# Patient Record
Sex: Female | Born: 1940
Health system: Southern US, Community
[De-identification: ages and names within clinical notes are randomized; demographics above are authoritative.]

## PROBLEM LIST (undated history)

## (undated) DIAGNOSIS — I2510 Atherosclerotic heart disease of native coronary artery without angina pectoris: Secondary | ICD-10-CM

## (undated) DIAGNOSIS — R600 Localized edema: Secondary | ICD-10-CM

## (undated) DIAGNOSIS — F32A Depression, unspecified: Secondary | ICD-10-CM

## (undated) DIAGNOSIS — K219 Gastro-esophageal reflux disease without esophagitis: Secondary | ICD-10-CM

## (undated) DIAGNOSIS — R609 Edema, unspecified: Secondary | ICD-10-CM

## (undated) DIAGNOSIS — F329 Major depressive disorder, single episode, unspecified: Secondary | ICD-10-CM

## (undated) DIAGNOSIS — F419 Anxiety disorder, unspecified: Secondary | ICD-10-CM

## (undated) DIAGNOSIS — K279 Peptic ulcer, site unspecified, unspecified as acute or chronic, without hemorrhage or perforation: Secondary | ICD-10-CM

## (undated) DIAGNOSIS — J449 Chronic obstructive pulmonary disease, unspecified: Secondary | ICD-10-CM

## (undated) DIAGNOSIS — K5520 Angiodysplasia of colon without hemorrhage: Secondary | ICD-10-CM

## (undated) DIAGNOSIS — K922 Gastrointestinal hemorrhage, unspecified: Secondary | ICD-10-CM

## (undated) DIAGNOSIS — I10 Essential (primary) hypertension: Secondary | ICD-10-CM

## (undated) DIAGNOSIS — K449 Diaphragmatic hernia without obstruction or gangrene: Secondary | ICD-10-CM

## (undated) DIAGNOSIS — K5790 Diverticulosis of intestine, part unspecified, without perforation or abscess without bleeding: Secondary | ICD-10-CM

## (undated) DIAGNOSIS — I890 Lymphedema, not elsewhere classified: Secondary | ICD-10-CM

## (undated) DIAGNOSIS — I4891 Unspecified atrial fibrillation: Secondary | ICD-10-CM

## (undated) DIAGNOSIS — I214 Non-ST elevation (NSTEMI) myocardial infarction: Secondary | ICD-10-CM

## (undated) DIAGNOSIS — M797 Fibromyalgia: Secondary | ICD-10-CM

## (undated) DIAGNOSIS — Z8701 Personal history of pneumonia (recurrent): Secondary | ICD-10-CM

## (undated) HISTORY — PX: CHOLECYSTECTOMY: SHX55

## (undated) HISTORY — DX: Chronic obstructive pulmonary disease, unspecified: J44.9

## (undated) HISTORY — DX: Major depressive disorder, single episode, unspecified: F32.9

## (undated) HISTORY — DX: Diverticulosis of intestine, part unspecified, without perforation or abscess without bleeding: K57.90

## (undated) HISTORY — DX: Gastrointestinal hemorrhage, unspecified: K92.2

## (undated) HISTORY — DX: Personal history of pneumonia (recurrent): Z87.01

## (undated) HISTORY — DX: Depression, unspecified: F32.A

## (undated) HISTORY — PX: ABDOMINAL EXPLORATION SURGERY: SHX538

## (undated) HISTORY — PX: ABDOMINAL HYSTERECTOMY: SHX81

## (undated) HISTORY — DX: Anxiety disorder, unspecified: F41.9

## (undated) HISTORY — PX: OTHER SURGICAL HISTORY: SHX169

## (undated) HISTORY — DX: Localized edema: R60.0

## (undated) HISTORY — PX: KNEE SURGERY: SHX244

## (undated) HISTORY — DX: Edema, unspecified: R60.9

---

## 2004-01-05 ENCOUNTER — Emergency Department (HOSPITAL_COMMUNITY): Admission: EM | Admit: 2004-01-05 | Discharge: 2004-01-05 | Payer: Self-pay | Admitting: Emergency Medicine

## 2004-03-31 ENCOUNTER — Ambulatory Visit (HOSPITAL_COMMUNITY): Admission: RE | Admit: 2004-03-31 | Discharge: 2004-03-31 | Payer: Self-pay | Admitting: *Deleted

## 2006-02-01 ENCOUNTER — Emergency Department (HOSPITAL_COMMUNITY): Admission: EM | Admit: 2006-02-01 | Discharge: 2006-02-01 | Payer: Self-pay | Admitting: Emergency Medicine

## 2008-03-16 ENCOUNTER — Emergency Department (HOSPITAL_COMMUNITY): Admission: EM | Admit: 2008-03-16 | Discharge: 2008-03-16 | Payer: Self-pay | Admitting: Emergency Medicine

## 2011-03-25 ENCOUNTER — Emergency Department (HOSPITAL_COMMUNITY)
Admission: EM | Admit: 2011-03-25 | Discharge: 2011-03-25 | Disposition: A | Discharge status: Home / Self Care | Payer: Medicare Other | Attending: Emergency Medicine | Admitting: Emergency Medicine

## 2011-03-25 ENCOUNTER — Encounter: Payer: Self-pay | Admitting: *Deleted

## 2011-03-25 ENCOUNTER — Emergency Department (HOSPITAL_COMMUNITY): Payer: Medicare Other

## 2011-03-25 DIAGNOSIS — F411 Generalized anxiety disorder: Secondary | ICD-10-CM | POA: Insufficient documentation

## 2011-03-25 DIAGNOSIS — Z79899 Other long term (current) drug therapy: Secondary | ICD-10-CM | POA: Insufficient documentation

## 2011-03-25 DIAGNOSIS — R3989 Other symptoms and signs involving the genitourinary system: Secondary | ICD-10-CM | POA: Insufficient documentation

## 2011-03-25 DIAGNOSIS — R609 Edema, unspecified: Secondary | ICD-10-CM | POA: Insufficient documentation

## 2011-03-25 DIAGNOSIS — I10 Essential (primary) hypertension: Secondary | ICD-10-CM | POA: Insufficient documentation

## 2011-03-25 DIAGNOSIS — R142 Eructation: Secondary | ICD-10-CM | POA: Insufficient documentation

## 2011-03-25 DIAGNOSIS — R109 Unspecified abdominal pain: Secondary | ICD-10-CM | POA: Insufficient documentation

## 2011-03-25 DIAGNOSIS — R197 Diarrhea, unspecified: Secondary | ICD-10-CM | POA: Insufficient documentation

## 2011-03-25 DIAGNOSIS — R141 Gas pain: Secondary | ICD-10-CM | POA: Insufficient documentation

## 2011-03-25 DIAGNOSIS — R5381 Other malaise: Secondary | ICD-10-CM | POA: Insufficient documentation

## 2011-03-25 DIAGNOSIS — K5289 Other specified noninfective gastroenteritis and colitis: Secondary | ICD-10-CM | POA: Insufficient documentation

## 2011-03-25 DIAGNOSIS — J45909 Unspecified asthma, uncomplicated: Secondary | ICD-10-CM | POA: Insufficient documentation

## 2011-03-25 DIAGNOSIS — K529 Noninfective gastroenteritis and colitis, unspecified: Secondary | ICD-10-CM

## 2011-03-25 HISTORY — DX: Fibromyalgia: M79.7

## 2011-03-25 HISTORY — DX: Essential (primary) hypertension: I10

## 2011-03-25 LAB — CBC
HCT: 40 % (ref 36.0–46.0)
Hemoglobin: 13 g/dL (ref 12.0–15.0)
MCH: 26.6 pg (ref 26.0–34.0)
MCHC: 32.5 g/dL (ref 30.0–36.0)
MCV: 82 fL (ref 78.0–100.0)
Platelets: 192 10*3/uL (ref 150–400)
RBC: 4.88 MIL/uL (ref 3.87–5.11)
RDW: 15.9 % — ABNORMAL HIGH (ref 11.5–15.5)
WBC: 5.7 10*3/uL (ref 4.0–10.5)

## 2011-03-25 LAB — COMPREHENSIVE METABOLIC PANEL
ALT: 26 U/L (ref 0–35)
AST: 22 U/L (ref 0–37)
Albumin: 3.6 g/dL (ref 3.5–5.2)
Alkaline Phosphatase: 100 U/L (ref 39–117)
BUN: 13 mg/dL (ref 6–23)
CO2: 32 mEq/L (ref 19–32)
Calcium: 9.4 mg/dL (ref 8.4–10.5)
Chloride: 99 mEq/L (ref 96–112)
Creatinine, Ser: 0.84 mg/dL (ref 0.50–1.10)
GFR calc Af Amer: >60 mL/min (ref >60)
GFR calc non Af Amer: >60 mL/min (ref >60)
Glucose, Bld: 86 mg/dL (ref 70–99)
Potassium: 3.7 mEq/L (ref 3.5–5.1)
Sodium: 137 mEq/L (ref 135–145)
Total Bilirubin: 0.2 mg/dL — ABNORMAL LOW (ref 0.3–1.2)
Total Protein: 7 g/dL (ref 6.0–8.3)

## 2011-03-25 LAB — DIFFERENTIAL
Basophils Absolute: 0 10*3/uL (ref 0.0–0.1)
Basophils Relative: 1 % (ref 0–1)
Eosinophils Absolute: 0.1 10*3/uL (ref 0.0–0.7)
Eosinophils Relative: 2 % (ref 0–5)
Lymphocytes Relative: 47 % — ABNORMAL HIGH (ref 12–46)
Lymphs Abs: 2.7 10*3/uL (ref 0.7–4.0)
Monocytes Absolute: 0.4 10*3/uL (ref 0.1–1.0)
Monocytes Relative: 7 % (ref 3–12)
Neutro Abs: 2.5 10*3/uL (ref 1.7–7.7)
Neutrophils Relative %: 45 % (ref 43–77)

## 2011-03-25 LAB — URINALYSIS, ROUTINE W REFLEX MICROSCOPIC
Bilirubin Urine: NEGATIVE
Glucose, UA: NEGATIVE mg/dL
Hgb urine dipstick: NEGATIVE
Ketones, ur: NEGATIVE mg/dL
Leukocytes, UA: NEGATIVE
Nitrite: NEGATIVE
Protein, ur: NEGATIVE mg/dL
Specific Gravity, Urine: 1.015 (ref 1.005–1.030)
Urobilinogen, UA: 0.2 mg/dL (ref 0.0–1.0)
pH: 6 (ref 5.0–8.0)

## 2011-03-25 MED ORDER — SODIUM CHLORIDE 0.9 % IV SOLN: Freq: Once | INTRAVENOUS | Status: DC

## 2011-03-25 NOTE — ED Notes (Signed)
Signature pad not working. Pt agreed to D/C and verbalized understanding. Pt stable and husband with pt for transport home.

## 2011-03-25 NOTE — ED Provider Notes (Signed)
Scribed for Sharon Lennert, MD, the patient was seen in room APA19/APA19 . This chart was scribed by Ellie Lunch. This patient's care was started at 7:56 PM.   CSN: [id number] Arrival date & time: 03/25/2011  7:11 PM  Chief Complaint  Patient presents with  . Flank Pain    (Consider location/radiation/quality/duration/timing/severity/associated sxs/prior treatment) HPI PT seen at 57: Sharon Compton is a 70 y.o. female who presents to the Emergency Department complaining of diarrhea and general abdominal pain for the past 4 days. Pain has been constant and is described as severe. Pt c/o associated weakness, hot flashes, clamminess, abdominal and pedal swelling and problems voiding. Pt reports she has not been able to urinate since onset. Pt reports she feels somewhat improved today. She denies bloody stool.   Past Medical History  Diagnosis Date  . Hypertension   . Anxiety   . Asthma   . Bronchitis   . Fibromyalgia   . Pneumonia     Past Surgical History  Procedure Date  . Abdominal hysterectomy   . Cholecystectomy     No family history on file.  History  Substance Use Topics  . Smoking status: Not on file  . Smokeless tobacco: Not on file  . Alcohol Use: No    Review of Systems  Constitutional: Negative for fatigue.  HENT: Negative for congestion, sinus pressure and ear discharge.   Eyes: Negative for discharge.  Respiratory: Negative for cough.   Cardiovascular: Positive for leg swelling. Negative for chest pain.  Gastrointestinal: Positive for abdominal pain, diarrhea and abdominal distention.  Genitourinary: Positive for difficulty urinating. Negative for frequency and hematuria.  Musculoskeletal: Negative for back pain.  Skin: Negative for rash.  Neurological: Negative for seizures and headaches.  Hematological: Negative.   Psychiatric/Behavioral: Negative for hallucinations.    Allergies  Codeine  Home Medications   Current Outpatient Rx  Name Route  Sig Dispense Refill  . ALBUTEROL SULFATE HFA 108 (90 BASE) MCG/ACT IN AERS Inhalation Inhale 2 puffs into the lungs 4 (four) times daily as needed. For shortness of breath     . ALPRAZOLAM 1 MG PO TABS Oral Take 1 mg by mouth every 6 (six) hours as needed. anxiety     . GABAPENTIN 300 MG PO CAPS Oral Take 300 mg by mouth 3 (three) times daily.      [name] LOSARTAN POTASSIUM 100 MG PO TABS Oral Take 100 mg by mouth daily.      [name] METOPROLOL SUCCINATE 100 MG PO TB24 Oral Take 100 mg by mouth daily.      [name] OMEPRAZOLE 40 MG PO CPDR Oral Take 40 mg by mouth daily.        BP 196/93  Pulse 102  Temp(Src) 98.7 F (37.1 C) (Oral)  Resp 22  Ht 5' (1.524 m)  Wt 181 lb (82.101 kg)  BMI 35.35 kg/m2  SpO2 99%  Physical Exam  Nursing note and vitals reviewed. Constitutional: She appears well-developed and well-nourished. No distress.  HENT:  Head: Normocephalic and atraumatic.  Right Ear: External ear normal.  Left Ear: External ear normal.  Eyes: Conjunctivae are normal. Right eye exhibits no discharge. Left eye exhibits no discharge. No scleral icterus.  Neck: Neck supple. No tracheal deviation present.       Bilateral anterior neck tenderness  Cardiovascular: Regular rhythm and intact distal pulses.   Pulmonary/Chest: Effort normal and breath sounds normal. No stridor. No respiratory distress. She has no wheezes. She has no rales.  Abdominal: Soft. Bowel sounds are normal. She exhibits distension. There is tenderness (general abdominal tenderness). There is no rebound and no guarding.  Musculoskeletal: She exhibits edema (2+ pitting edema at ankles). She exhibits no tenderness.  Neurological: She is alert. She has normal strength. No sensory deficit. Cranial nerve deficit:  no gross defecits noted. She exhibits normal muscle tone. She displays no seizure activity. Coordination normal.  Skin: Skin is warm and dry. No rash noted.  Psychiatric: She has a normal mood and affect.   Tenderness anterior  neck. Distended abdomen general tenderness. 2+ edema ankles.  Procedures  OTHER DATA REVIEWED: Nursing notes, vital signs, and past medical records reviewed.  DIAGNOSTIC STUDIES: Oxygen Saturation is 99% on room air, normal by my interpretation.    LABS / RADIOLOGY:  Labs Reviewed  CBC - Abnormal; Notable for the following:    RDW 15.9 (*)    All other components within normal limits  DIFFERENTIAL - Abnormal; Notable for the following:    Lymphocytes Relative 47 (*)    All other components within normal limits  COMPREHENSIVE METABOLIC PANEL - Abnormal; Notable for the following:    Total Bilirubin 0.2 (*)    All other components within normal limits  URINALYSIS, ROUTINE W REFLEX MICROSCOPIC  Dg Abd Acute W/chest  03/25/2011  *RADIOLOGY REPORT*  Clinical Data: Abdominal pain.  Anuria for 1 day.  ACUTE ABDOMEN SERIES (ABDOMEN 2 VIEW & CHEST 1 VIEW)  Comparison: None.  Findings: Shallow inspiration. Normal heart size and pulmonary vascularity.  No focal consolidation in the lungs.  No blunting of costophrenic angles.  Scattered gas and stool in the colon.  No small or large bowel dilatation.  No free intra-abdominal air.  No abnormal air fluid levels.  No radiopaque stones. Surgical clips in the right upper quadrant.  IMPRESSION: No evidence of active pulmonary disease.  Nonobstructive bowel gas pattern.  Original Report Authenticated By: Marlon Pel, M.D.     ED COURSE / COORDINATION OF CARE: 20:50 Pt received foley cath. 300 ml clear yellow urine drained. 21:42 Pt recheck. Discussed lab and imaging results. Discussed plan to discharge.  abd pain from gastroenteritis.   Decrease urine output from dehydration The chart was scribed for me under my direct supervision.  I personally performed the history, physical, and medical decision making and all procedures in the evaluation of this patient.Sharon Lennert, MD 03/25/11 [phone]

## 2011-03-25 NOTE — ED Notes (Signed)
Clear yellow urine draining to foley cath. Urine sample sent. NAD at this time

## 2011-03-25 NOTE — ED Notes (Signed)
Pt reports bilateral flank pain starting 4 days ago

## 2011-03-26 LAB — CBC
HCT: 37
Hemoglobin: 12.2
MCHC: 32.9
MCV: 77.8 — ABNORMAL LOW
Platelets: 242
RBC: 4.76
RDW: 17.9 — ABNORMAL HIGH
WBC: 7.6

## 2011-03-26 LAB — DIFFERENTIAL
Basophils Absolute: 0
Basophils Relative: 1
Eosinophils Absolute: 0.1
Eosinophils Relative: 1
Lymphocytes Relative: 48 — ABNORMAL HIGH
Lymphs Abs: 3.6
Monocytes Absolute: 0.5
Monocytes Relative: 7
Neutro Abs: 3.3
Neutrophils Relative %: 44

## 2011-03-26 LAB — BASIC METABOLIC PANEL
BUN: 12
CO2: 32
Calcium: 8.9
Chloride: 93 — ABNORMAL LOW
Creatinine, Ser: 0.87
GFR calc Af Amer: >60
GFR calc non Af Amer: >60
Glucose, Bld: 97
Potassium: 3 — ABNORMAL LOW
Sodium: 133 — ABNORMAL LOW

## 2011-06-24 ENCOUNTER — Encounter (HOSPITAL_COMMUNITY): Payer: Self-pay | Admitting: Emergency Medicine

## 2011-06-24 ENCOUNTER — Emergency Department (HOSPITAL_COMMUNITY): Payer: Medicare Other

## 2011-06-24 ENCOUNTER — Emergency Department (HOSPITAL_COMMUNITY)
Admission: EM | Admit: 2011-06-24 | Discharge: 2011-06-25 | Disposition: A | Discharge status: Home / Self Care | Payer: Medicare Other | Attending: Emergency Medicine | Admitting: Emergency Medicine

## 2011-06-24 DIAGNOSIS — R609 Edema, unspecified: Secondary | ICD-10-CM | POA: Insufficient documentation

## 2011-06-24 DIAGNOSIS — R0602 Shortness of breath: Secondary | ICD-10-CM | POA: Insufficient documentation

## 2011-06-24 DIAGNOSIS — Z8701 Personal history of pneumonia (recurrent): Secondary | ICD-10-CM | POA: Insufficient documentation

## 2011-06-24 DIAGNOSIS — J3489 Other specified disorders of nose and nasal sinuses: Secondary | ICD-10-CM | POA: Insufficient documentation

## 2011-06-24 DIAGNOSIS — R509 Fever, unspecified: Secondary | ICD-10-CM | POA: Insufficient documentation

## 2011-06-24 DIAGNOSIS — Z9079 Acquired absence of other genital organ(s): Secondary | ICD-10-CM | POA: Insufficient documentation

## 2011-06-24 DIAGNOSIS — J45909 Unspecified asthma, uncomplicated: Secondary | ICD-10-CM | POA: Insufficient documentation

## 2011-06-24 DIAGNOSIS — K449 Diaphragmatic hernia without obstruction or gangrene: Secondary | ICD-10-CM | POA: Insufficient documentation

## 2011-06-24 DIAGNOSIS — F411 Generalized anxiety disorder: Secondary | ICD-10-CM | POA: Insufficient documentation

## 2011-06-24 DIAGNOSIS — J984 Other disorders of lung: Secondary | ICD-10-CM | POA: Insufficient documentation

## 2011-06-24 DIAGNOSIS — IMO0001 Reserved for inherently not codable concepts without codable children: Secondary | ICD-10-CM | POA: Insufficient documentation

## 2011-06-24 DIAGNOSIS — R197 Diarrhea, unspecified: Secondary | ICD-10-CM | POA: Insufficient documentation

## 2011-06-24 DIAGNOSIS — R110 Nausea: Secondary | ICD-10-CM | POA: Insufficient documentation

## 2011-06-24 DIAGNOSIS — I10 Essential (primary) hypertension: Secondary | ICD-10-CM | POA: Insufficient documentation

## 2011-06-24 DIAGNOSIS — Z87891 Personal history of nicotine dependence: Secondary | ICD-10-CM | POA: Insufficient documentation

## 2011-06-24 DIAGNOSIS — Z9889 Other specified postprocedural states: Secondary | ICD-10-CM | POA: Insufficient documentation

## 2011-06-24 DIAGNOSIS — R070 Pain in throat: Secondary | ICD-10-CM | POA: Insufficient documentation

## 2011-06-24 HISTORY — DX: Diaphragmatic hernia without obstruction or gangrene: K44.9

## 2011-06-24 HISTORY — DX: Peptic ulcer, site unspecified, unspecified as acute or chronic, without hemorrhage or perforation: K27.9

## 2011-06-24 LAB — URINALYSIS, ROUTINE W REFLEX MICROSCOPIC
Bilirubin Urine: NEGATIVE
Glucose, UA: NEGATIVE mg/dL
Ketones, ur: NEGATIVE mg/dL
Leukocytes, UA: NEGATIVE
Nitrite: NEGATIVE
Protein, ur: NEGATIVE mg/dL
Specific Gravity, Urine: 1.02 (ref 1.005–1.030)
Urobilinogen, UA: 0.2 mg/dL (ref 0.0–1.0)
pH: 6 (ref 5.0–8.0)

## 2011-06-24 LAB — URINE MICROSCOPIC-ADD ON

## 2011-06-24 MED ORDER — ALBUTEROL SULFATE (5 MG/ML) 0.5% IN NEBU
5.0000 mg | INHALATION_SOLUTION | Freq: Once | RESPIRATORY_TRACT | Status: AC
  Administered 2011-06-24: 5 mg via RESPIRATORY_TRACT
  Filled 2011-06-24: qty 1

## 2011-06-24 MED ORDER — ALBUTEROL SULFATE (5 MG/ML) 0.5% IN NEBU: 2.5000 mg | INHALATION_SOLUTION | Freq: Once | RESPIRATORY_TRACT | Status: DC

## 2011-06-24 MED ORDER — IPRATROPIUM BROMIDE 0.02 % IN SOLN
0.5000 mg | Freq: Once | RESPIRATORY_TRACT | Status: AC
  Administered 2011-06-24: 0.5 mg via RESPIRATORY_TRACT
  Filled 2011-06-24: qty 2.5

## 2011-06-24 NOTE — ED Notes (Signed)
Patient c/o generalized aching, cough, and fevers x1 week. Per patient was at PCP to have blood work done last week and multiple people there had the flu.

## 2011-06-24 NOTE — ED Notes (Signed)
Pt states was exposed to the flu approx a week ago, has had chills,fever, sore throat, headache, productive cough (green sputum per pt) and body aches.  Pt reports bilateral  flank pain  That started 2 days ago.  Urine specimen obtained and sent to lab.

## 2011-06-24 NOTE — ED Provider Notes (Signed)
History     CSN: [id number]  Arrival date & time 06/24/11  1478   First MD Initiated Contact with Patient 06/24/11 2230      Chief Complaint  Patient presents with  . Generalized Body Aches  . Cough  . Fever    (Consider location/radiation/quality/duration/timing/severity/associated sxs/prior treatment) HPI This is a 70 year old white female with a 5 day history of body aches, subjective fever and shortness of breath. The shortness of breath is worsened and is her principal reason for being here tonight. It is moderate to severe. It is exacerbated by exertion and not relieved by her albuterol inhaler. She states she believes she was exposed to flu about a week ago. She has had nausea and some diarrhea but no vomiting. She has sore throat and nasal congestion. She states she is frequently thirsty but does not urinate very much. This is been a chronic issue for her, as has lower extremity edema.  Past Medical History  Diagnosis Date  . Hypertension   . Anxiety   . Asthma   . Bronchitis   . Fibromyalgia   . Pneumonia   . Edema   . Peptic ulcer disease   . Hiatal hernia     Past Surgical History  Procedure Date  . Abdominal hysterectomy   . Cholecystectomy   . Abdominal exploration surgery   . Abd tumor removed     Family History  Problem Relation Age of Onset  . Diabetes Mother   . Hypertension Mother   . Cancer Father     History  Substance Use Topics  . Smoking status: Former Smoker -- 1.0 packs/day for 15 years    Types: Cigarettes    Quit date: 06/24/1999  . Smokeless tobacco: Never Used  . Alcohol Use: No    OB History    Grav Para Term Preterm Abortions TAB SAB Ect Mult Living   3 3 3       3       Review of Systems  All other systems reviewed and are negative.    Allergies  Sulfa antibiotics and Codeine  Home Medications   Current Outpatient Rx  Name Route Sig Dispense Refill  . ALBUTEROL SULFATE HFA 108 (90 BASE) MCG/ACT IN AERS  Inhalation Inhale 2 puffs into the lungs 4 (four) times daily as needed. For shortness of breath     . ALPRAZOLAM 1 MG PO TABS Oral Take 2 mg by mouth every morning. anxiety    . GABAPENTIN 300 MG PO CAPS Oral Take 300 mg by mouth 3 (three) times daily.      [name] POLYSACCHARIDE IRON COMPLEX 150 MG PO CAPS Oral Take 150 mg by mouth daily.      [name] LEVOTHYROXINE SODIUM 75 MCG PO TABS Oral Take 75 mcg by mouth daily.      [name] LOSARTAN POTASSIUM 100 MG PO TABS Oral Take 100 mg by mouth daily.      [name] METOPROLOL SUCCINATE ER 100 MG PO TB24 Oral Take 100 mg by mouth daily.        BP 200/96  Pulse 75  Temp(Src) 98.3 F (36.8 C) (Oral)  Resp 22  Ht 5' (1.524 m)  Wt 180 lb (81.647 kg)  BMI 35.15 kg/m2  SpO2 97%  Physical Exam General: Well-developed, well-nourished female in no acute distress; appearance consistent with age of record HENT: normocephalic, atraumatic Eyes: pupils equal round and reactive to light; extraocular muscles intact Neck: supple Heart: regular rate and rhythm Lungs: decreased  air movement bilaterally; shallow breaths; expiratory wheezes Abdomen: soft; nontender; nondistended; bowel sounds present Extremities: No deformity; full range of motion; 3+ pitting edema of lower extremities Neurologic: Awake, alert and oriented; motor function intact in all extremities and symmetric; no facial droop Skin: Warm and dry Psychiatric: Normal mood and affect    ED Course  Procedures (including critical care time)    MDM   Nursing notes and vitals signs, including pulse oximetry, reviewed.  Summary of this visit's results, reviewed by myself:  Labs:  Results for orders placed during the hospital encounter of 06/24/11  URINALYSIS, ROUTINE W REFLEX MICROSCOPIC      Component Value Range   Color, Urine YELLOW  YELLOW    APPearance CLEAR  CLEAR    Specific Gravity, Urine 1.020  1.005 - 1.030    pH 6.0  5.0 - 8.0    Glucose, UA NEGATIVE  NEGATIVE (mg/dL)   Hgb urine dipstick  TRACE (*) NEGATIVE    Bilirubin Urine NEGATIVE  NEGATIVE    Ketones, ur NEGATIVE  NEGATIVE (mg/dL)   Protein, ur NEGATIVE  NEGATIVE (mg/dL)   Urobilinogen, UA 0.2  0.0 - 1.0 (mg/dL)   Nitrite NEGATIVE  NEGATIVE    Leukocytes, UA NEGATIVE  NEGATIVE   URINE MICROSCOPIC-ADD ON      Component Value Range   Squamous Epithelial / LPF FEW (*) RARE    WBC, UA 3-6  <3 (WBC/hpf)   RBC / HPF 7-10  <3 (RBC/hpf)   Bacteria, UA RARE  RARE    Dg Chest 2 View  06/25/2011  *RADIOLOGY REPORT*  Clinical Data: Cough and shortness of breath.  Weakness.  Flu symptoms.  Fever, sweats, chills.  CHEST - 2 VIEW  Comparison: 01/05/2004  Findings: The cardiomediastinal silhouette is within normal limits. The lungs are free of focal consolidations and pleural effusions. Surgical clips are present in the right upper quadrant of the abdomen. Visualized osseous structures have a normal appearance.  IMPRESSION: Negative exam.  Original Report Authenticated By: Patterson Hammersmith, M.D.   1:38 AM Air movement improved after 2 neb treatments. Patient states she is ready to go home. Suspect influenza with exacerbation of her chronic lung disease.         Hanley Seamen, MD 06/25/11 [phone]

## 2011-06-25 MED ORDER — ALBUTEROL SULFATE (5 MG/ML) 0.5% IN NEBU
5.0000 mg | INHALATION_SOLUTION | Freq: Once | RESPIRATORY_TRACT | Status: AC
  Administered 2011-06-25: 5 mg via RESPIRATORY_TRACT
  Filled 2011-06-25: qty 1

## 2011-06-25 MED ORDER — IPRATROPIUM BROMIDE 0.02 % IN SOLN
0.5000 mg | Freq: Once | RESPIRATORY_TRACT | Status: AC
  Administered 2011-06-25: 0.5 mg via RESPIRATORY_TRACT
  Filled 2011-06-25: qty 2.5

## 2011-07-31 DIAGNOSIS — R404 Transient alteration of awareness: Secondary | ICD-10-CM | POA: Diagnosis not present

## 2011-07-31 DIAGNOSIS — R0602 Shortness of breath: Secondary | ICD-10-CM | POA: Diagnosis not present

## 2011-07-31 DIAGNOSIS — I4892 Unspecified atrial flutter: Secondary | ICD-10-CM | POA: Diagnosis present

## 2011-07-31 DIAGNOSIS — I6529 Occlusion and stenosis of unspecified carotid artery: Secondary | ICD-10-CM | POA: Diagnosis not present

## 2011-07-31 DIAGNOSIS — R609 Edema, unspecified: Secondary | ICD-10-CM | POA: Diagnosis present

## 2011-07-31 DIAGNOSIS — J45909 Unspecified asthma, uncomplicated: Secondary | ICD-10-CM | POA: Diagnosis present

## 2011-07-31 DIAGNOSIS — I959 Hypotension, unspecified: Secondary | ICD-10-CM | POA: Diagnosis present

## 2011-07-31 DIAGNOSIS — R0989 Other specified symptoms and signs involving the circulatory and respiratory systems: Secondary | ICD-10-CM | POA: Diagnosis not present

## 2011-07-31 DIAGNOSIS — Z8249 Family history of ischemic heart disease and other diseases of the circulatory system: Secondary | ICD-10-CM | POA: Diagnosis not present

## 2011-07-31 DIAGNOSIS — Z79899 Other long term (current) drug therapy: Secondary | ICD-10-CM | POA: Diagnosis not present

## 2011-07-31 DIAGNOSIS — F4310 Post-traumatic stress disorder, unspecified: Secondary | ICD-10-CM | POA: Diagnosis present

## 2011-07-31 DIAGNOSIS — Z8679 Personal history of other diseases of the circulatory system: Secondary | ICD-10-CM | POA: Diagnosis not present

## 2011-07-31 DIAGNOSIS — I4891 Unspecified atrial fibrillation: Secondary | ICD-10-CM | POA: Diagnosis not present

## 2011-07-31 DIAGNOSIS — F411 Generalized anxiety disorder: Secondary | ICD-10-CM | POA: Diagnosis present

## 2011-07-31 DIAGNOSIS — E669 Obesity, unspecified: Secondary | ICD-10-CM | POA: Diagnosis present

## 2011-07-31 DIAGNOSIS — Z882 Allergy status to sulfonamides status: Secondary | ICD-10-CM | POA: Diagnosis not present

## 2011-07-31 DIAGNOSIS — T7491XA Unspecified adult maltreatment, confirmed, initial encounter: Secondary | ICD-10-CM | POA: Diagnosis not present

## 2011-07-31 DIAGNOSIS — I472 Ventricular tachycardia: Secondary | ICD-10-CM | POA: Diagnosis not present

## 2011-07-31 DIAGNOSIS — I658 Occlusion and stenosis of other precerebral arteries: Secondary | ICD-10-CM | POA: Diagnosis present

## 2011-07-31 DIAGNOSIS — R0789 Other chest pain: Secondary | ICD-10-CM | POA: Diagnosis not present

## 2011-07-31 DIAGNOSIS — R5381 Other malaise: Secondary | ICD-10-CM | POA: Diagnosis not present

## 2011-07-31 DIAGNOSIS — I10 Essential (primary) hypertension: Secondary | ICD-10-CM | POA: Diagnosis not present

## 2011-07-31 DIAGNOSIS — R079 Chest pain, unspecified: Secondary | ICD-10-CM | POA: Diagnosis not present

## 2011-07-31 DIAGNOSIS — G8929 Other chronic pain: Secondary | ICD-10-CM | POA: Diagnosis present

## 2011-08-09 DIAGNOSIS — R609 Edema, unspecified: Secondary | ICD-10-CM | POA: Diagnosis not present

## 2011-08-09 DIAGNOSIS — I4891 Unspecified atrial fibrillation: Secondary | ICD-10-CM | POA: Diagnosis not present

## 2011-08-16 DIAGNOSIS — I872 Venous insufficiency (chronic) (peripheral): Secondary | ICD-10-CM | POA: Diagnosis not present

## 2011-08-20 ENCOUNTER — Encounter (HOSPITAL_COMMUNITY): Payer: Self-pay

## 2011-08-20 ENCOUNTER — Inpatient Hospital Stay (HOSPITAL_COMMUNITY)
Admission: RE | Admit: 2011-08-20 | Discharge: 2011-08-26 | DRG: 378 | Disposition: A | Discharge status: Home / Self Care | Payer: Medicare Other | Attending: Internal Medicine | Admitting: Internal Medicine

## 2011-08-20 ENCOUNTER — Other Ambulatory Visit: Payer: Self-pay

## 2011-08-20 DIAGNOSIS — R7989 Other specified abnormal findings of blood chemistry: Secondary | ICD-10-CM | POA: Diagnosis present

## 2011-08-20 DIAGNOSIS — K2960 Other gastritis without bleeding: Secondary | ICD-10-CM | POA: Diagnosis present

## 2011-08-20 DIAGNOSIS — R600 Localized edema: Secondary | ICD-10-CM | POA: Diagnosis present

## 2011-08-20 DIAGNOSIS — R7402 Elevation of levels of lactic acid dehydrogenase (LDH): Secondary | ICD-10-CM | POA: Diagnosis not present

## 2011-08-20 DIAGNOSIS — I872 Venous insufficiency (chronic) (peripheral): Secondary | ICD-10-CM | POA: Diagnosis present

## 2011-08-20 DIAGNOSIS — K449 Diaphragmatic hernia without obstruction or gangrene: Secondary | ICD-10-CM | POA: Diagnosis not present

## 2011-08-20 DIAGNOSIS — Z23 Encounter for immunization: Secondary | ICD-10-CM

## 2011-08-20 DIAGNOSIS — D62 Acute posthemorrhagic anemia: Secondary | ICD-10-CM | POA: Diagnosis present

## 2011-08-20 DIAGNOSIS — R609 Edema, unspecified: Secondary | ICD-10-CM | POA: Diagnosis present

## 2011-08-20 DIAGNOSIS — I4891 Unspecified atrial fibrillation: Secondary | ICD-10-CM | POA: Diagnosis present

## 2011-08-20 DIAGNOSIS — Z6837 Body mass index (BMI) 37.0-37.9, adult: Secondary | ICD-10-CM

## 2011-08-20 DIAGNOSIS — Z8711 Personal history of peptic ulcer disease: Secondary | ICD-10-CM | POA: Diagnosis not present

## 2011-08-20 DIAGNOSIS — K921 Melena: Secondary | ICD-10-CM | POA: Diagnosis not present

## 2011-08-20 DIAGNOSIS — K922 Gastrointestinal hemorrhage, unspecified: Secondary | ICD-10-CM | POA: Diagnosis present

## 2011-08-20 DIAGNOSIS — I10 Essential (primary) hypertension: Secondary | ICD-10-CM | POA: Diagnosis not present

## 2011-08-20 DIAGNOSIS — Z860100 Personal history of colon polyps, unspecified: Secondary | ICD-10-CM

## 2011-08-20 DIAGNOSIS — E876 Hypokalemia: Secondary | ICD-10-CM | POA: Diagnosis not present

## 2011-08-20 DIAGNOSIS — K219 Gastro-esophageal reflux disease without esophagitis: Secondary | ICD-10-CM | POA: Diagnosis present

## 2011-08-20 DIAGNOSIS — K5730 Diverticulosis of large intestine without perforation or abscess without bleeding: Secondary | ICD-10-CM | POA: Diagnosis not present

## 2011-08-20 DIAGNOSIS — R404 Transient alteration of awareness: Secondary | ICD-10-CM | POA: Diagnosis not present

## 2011-08-20 DIAGNOSIS — R7401 Elevation of levels of liver transaminase levels: Secondary | ICD-10-CM | POA: Diagnosis not present

## 2011-08-20 DIAGNOSIS — E669 Obesity, unspecified: Secondary | ICD-10-CM | POA: Diagnosis present

## 2011-08-20 DIAGNOSIS — J209 Acute bronchitis, unspecified: Secondary | ICD-10-CM | POA: Diagnosis present

## 2011-08-20 DIAGNOSIS — F419 Anxiety disorder, unspecified: Secondary | ICD-10-CM | POA: Diagnosis present

## 2011-08-20 DIAGNOSIS — K5521 Angiodysplasia of colon with hemorrhage: Secondary | ICD-10-CM | POA: Diagnosis present

## 2011-08-20 DIAGNOSIS — K5520 Angiodysplasia of colon without hemorrhage: Secondary | ICD-10-CM | POA: Diagnosis not present

## 2011-08-20 DIAGNOSIS — Z8701 Personal history of pneumonia (recurrent): Secondary | ICD-10-CM

## 2011-08-20 DIAGNOSIS — IMO0001 Reserved for inherently not codable concepts without codable children: Secondary | ICD-10-CM | POA: Diagnosis present

## 2011-08-20 DIAGNOSIS — R059 Cough, unspecified: Secondary | ICD-10-CM | POA: Diagnosis not present

## 2011-08-20 DIAGNOSIS — J45909 Unspecified asthma, uncomplicated: Secondary | ICD-10-CM | POA: Diagnosis present

## 2011-08-20 DIAGNOSIS — F411 Generalized anxiety disorder: Secondary | ICD-10-CM | POA: Diagnosis present

## 2011-08-20 DIAGNOSIS — Z7901 Long term (current) use of anticoagulants: Secondary | ICD-10-CM

## 2011-08-20 DIAGNOSIS — Z7982 Long term (current) use of aspirin: Secondary | ICD-10-CM | POA: Diagnosis not present

## 2011-08-20 DIAGNOSIS — D649 Anemia, unspecified: Secondary | ICD-10-CM | POA: Diagnosis not present

## 2011-08-20 DIAGNOSIS — Z79899 Other long term (current) drug therapy: Secondary | ICD-10-CM | POA: Diagnosis not present

## 2011-08-20 DIAGNOSIS — Z8679 Personal history of other diseases of the circulatory system: Secondary | ICD-10-CM

## 2011-08-20 HISTORY — DX: Gastro-esophageal reflux disease without esophagitis: K21.9

## 2011-08-20 HISTORY — DX: Unspecified atrial fibrillation: I48.91

## 2011-08-20 LAB — HEMOGLOBIN AND HEMATOCRIT, BLOOD
HCT: 25.1 % — ABNORMAL LOW (ref 36.0–46.0)
Hemoglobin: 8.2 g/dL — ABNORMAL LOW (ref 12.0–15.0)

## 2011-08-20 LAB — CBC
Hemoglobin: 8.7 g/dL — ABNORMAL LOW (ref 12.0–15.0)
MCH: 27.4 pg (ref 26.0–34.0)
MCHC: 32.8 g/dL (ref 30.0–36.0)
Platelets: 215 10*3/uL (ref 150–400)
RDW: 15.6 % — ABNORMAL HIGH (ref 11.5–15.5)

## 2011-08-20 LAB — BASIC METABOLIC PANEL
Calcium: 8.9 mg/dL (ref 8.4–10.5)
GFR calc Af Amer: 62 mL/min — ABNORMAL LOW (ref >90)
GFR calc non Af Amer: 54 mL/min — ABNORMAL LOW (ref >90)
Glucose, Bld: 125 mg/dL — ABNORMAL HIGH (ref 70–99)
Sodium: 140 mEq/L (ref 135–145)

## 2011-08-20 MED ORDER — SODIUM CHLORIDE 0.9 % IJ SOLN
3.0000 mL | Freq: Two times a day (BID) | INTRAMUSCULAR | Status: DC
  Administered 2011-08-20 – 2011-08-23 (×7): 3 mL via INTRAVENOUS
  Administered 2011-08-24: 14:00:00 via INTRAVENOUS
  Administered 2011-08-25 (×2): 3 mL via INTRAVENOUS
  Filled 2011-08-20 (×8): qty 3

## 2011-08-20 MED ORDER — ACETAMINOPHEN 325 MG PO TABS
650.0000 mg | ORAL_TABLET | Freq: Four times a day (QID) | ORAL | Status: DC | PRN
  Administered 2011-08-21 – 2011-08-23 (×4): 650 mg via ORAL
  Filled 2011-08-20 (×7): qty 2

## 2011-08-20 MED ORDER — ALPRAZOLAM 1 MG PO TABS
1.0000 mg | ORAL_TABLET | Freq: Four times a day (QID) | ORAL | Status: DC | PRN
  Administered 2011-08-21 – 2011-08-26 (×9): 1 mg via ORAL
  Filled 2011-08-20 (×10): qty 1

## 2011-08-20 MED ORDER — MORPHINE SULFATE 2 MG/ML IJ SOLN
2.0000 mg | INTRAMUSCULAR | Status: DC | PRN
  Administered 2011-08-21 – 2011-08-23 (×10): 2 mg via INTRAVENOUS
  Filled 2011-08-20 (×10): qty 1

## 2011-08-20 MED ORDER — ZOLPIDEM TARTRATE 5 MG PO TABS
5.0000 mg | ORAL_TABLET | Freq: Once | ORAL | Status: AC
  Administered 2011-08-20: 5 mg via ORAL
  Filled 2011-08-20: qty 1

## 2011-08-20 MED ORDER — PANTOPRAZOLE SODIUM 40 MG IV SOLR
40.0000 mg | Freq: Two times a day (BID) | INTRAVENOUS | Status: DC
  Administered 2011-08-20 – 2011-08-24 (×8): 40 mg via INTRAVENOUS
  Filled 2011-08-20 (×8): qty 40

## 2011-08-20 MED ORDER — ONDANSETRON HCL 4 MG/2ML IJ SOLN
4.0000 mg | Freq: Four times a day (QID) | INTRAMUSCULAR | Status: DC | PRN
  Administered 2011-08-22: 4 mg via INTRAVENOUS
  Filled 2011-08-20: qty 2

## 2011-08-20 MED ORDER — METOPROLOL TARTRATE 1 MG/ML IV SOLN: 5.0000 mg | Freq: Four times a day (QID) | INTRAVENOUS | Status: DC | PRN

## 2011-08-20 MED ORDER — PANTOPRAZOLE SODIUM 40 MG IV SOLR
40.0000 mg | Freq: Once | INTRAVENOUS | Status: AC
  Administered 2011-08-20: 40 mg via INTRAVENOUS
  Filled 2011-08-20: qty 40

## 2011-08-20 MED ORDER — SODIUM CHLORIDE 0.9 % IV SOLN
Freq: Once | INTRAVENOUS | Status: AC
  Administered 2011-08-20: 16:00:00 via INTRAVENOUS

## 2011-08-20 MED ORDER — ONDANSETRON HCL 4 MG PO TABS: 4.0000 mg | ORAL_TABLET | Freq: Four times a day (QID) | ORAL | Status: DC | PRN

## 2011-08-20 MED ORDER — SODIUM CHLORIDE 0.9 % IV SOLN
INTRAVENOUS | Status: DC
  Administered 2011-08-21: 11:00:00 via INTRAVENOUS
  Administered 2011-08-23: 1000 mL via INTRAVENOUS
  Administered 2011-08-24: 50 mL/h via INTRAVENOUS

## 2011-08-20 MED ORDER — GABAPENTIN 300 MG PO CAPS
300.0000 mg | ORAL_CAPSULE | Freq: Three times a day (TID) | ORAL | Status: DC
  Administered 2011-08-20 – 2011-08-26 (×17): 300 mg via ORAL
  Filled 2011-08-20 (×17): qty 1

## 2011-08-20 MED ORDER — ACETAMINOPHEN 650 MG RE SUPP: 650.0000 mg | Freq: Four times a day (QID) | RECTAL | Status: DC | PRN

## 2011-08-20 MED ORDER — ALBUTEROL SULFATE HFA 108 (90 BASE) MCG/ACT IN AERS: 2.0000 | INHALATION_SPRAY | Freq: Four times a day (QID) | RESPIRATORY_TRACT | Status: DC | PRN

## 2011-08-20 MED ORDER — PANTOPRAZOLE SODIUM 40 MG IV SOLR: 40.0000 mg | Freq: Two times a day (BID) | INTRAVENOUS | Status: DC

## 2011-08-20 MED ORDER — SODIUM CHLORIDE 0.9 % IV SOLN: INTRAVENOUS | Status: DC

## 2011-08-20 MED ORDER — INFLUENZA VIRUS VACC SPLIT PF IM SUSP
0.5000 mL | INTRAMUSCULAR | Status: AC
  Administered 2011-08-21: 0.5 mL via INTRAMUSCULAR
  Filled 2011-08-20: qty 0.5

## 2011-08-20 MED ORDER — PNEUMOCOCCAL VAC POLYVALENT 25 MCG/0.5ML IJ INJ
0.5000 mL | INJECTION | INTRAMUSCULAR | Status: AC
  Administered 2011-08-21: 0.5 mL via INTRAMUSCULAR
  Filled 2011-08-20: qty 0.5

## 2011-08-20 MED ORDER — ONDANSETRON HCL 4 MG/2ML IJ SOLN
4.0000 mg | Freq: Once | INTRAMUSCULAR | Status: AC
  Administered 2011-08-20: 4 mg via INTRAVENOUS
  Filled 2011-08-20: qty 2

## 2011-08-20 NOTE — ED Provider Notes (Signed)
History   This chart was scribed for [employer]. Colon Branch, MD by Sofie Rower. The patient was seen in room APAH5/APAH5 and the patient's care was started at 1:45PM.    CSN: [id number]  Arrival date & time 08/20/11  1212   First MD Initiated Contact with Patient 08/20/11 1335      Chief Complaint  Patient presents with  . Rectal Bleeding  . Weakness    (Consider location/radiation/quality/duration/timing/severity/associated sxs/prior treatment) HPI  Sharon Compton is a 71 y.o. female who, brought by EMS, presents to the Emergency Department complaining of moderate, constant rectal bleeding onset four days ago with associated symptoms of irregular bowel movements (seven times last night) consisting of dark black coloration mixed with blood. Pt was recently hospitalized at [facility] with atrial fibrillation. Pt had a cardioversion and TEE done at [facility]. Pt has hx of irregular heartbeat, hiatal hernia, gastric ulcers.  PCP is Dr. Izola Price.  Past Medical History  Diagnosis Date  . Hypertension   . Anxiety   . Asthma   . Bronchitis   . Fibromyalgia   . Pneumonia   . Edema   . Peptic ulcer disease   . Hiatal hernia     Past Surgical History  Procedure Date  . Abdominal hysterectomy   . Cholecystectomy   . Abdominal exploration surgery   . Abd tumor removed     Family History  Problem Relation Age of Onset  . Diabetes Mother   . Hypertension Mother   . Cancer Father     History  Substance Use Topics  . Smoking status: Former Smoker -- 1.0 packs/day for 15 years    Types: Cigarettes    Quit date: 06/24/1999  . Smokeless tobacco: Never Used  . Alcohol Use: No    OB History    Grav Para Term Preterm Abortions TAB SAB Ect Mult Living   3 3 3       3       Review of Systems  All other systems reviewed and are negative.   10 Systems reviewed and are negative for acute change except as noted in the HPI.   Allergies  Sulfa antibiotics and Codeine  Home Medications     Current Outpatient Rx  Name Route Sig Dispense Refill  . ALPRAZOLAM 1 MG PO TABS Oral Take 1 mg by mouth 4 (four) times daily as needed. Anxiety    . ASPIRIN 81 MG PO CHEW Oral Chew 81 mg by mouth daily as needed. Chest Pains    . VITAMIN D 2000 UNITS PO TABS Oral Take 2,000 Units by mouth daily.    [name] DILTIAZEM HCL 120 MG PO TABS Oral Take 120 mg by mouth daily.    . OMEGA-3 FATTY ACIDS 1000 MG PO CAPS Oral Take 1 g by mouth daily.    [name] GABAPENTIN 300 MG PO CAPS Oral Take 300 mg by mouth 3 (three) times daily.      . IBUPROFEN 200 MG PO TABS Oral Take 400 mg by mouth every 6 (six) hours as needed. Headache    . POLYSACCHARIDE IRON COMPLEX 150 MG PO CAPS Oral Take 150 mg by mouth daily.      [name] METOPROLOL TARTRATE 50 MG PO TABS Oral Take 50 mg by mouth 2 (two) times daily.    [name] RIVAROXABAN 20 MG PO TABS Oral Take 20 mg by mouth daily.    . ALBUTEROL SULFATE HFA 108 (90 BASE) MCG/ACT IN AERS Inhalation Inhale 2 puffs into the  lungs 4 (four) times daily as needed. For shortness of breath      BP 135/68  Pulse 70  Temp(Src) 97.9 F (36.6 C) (Oral)  Resp 20  SpO2 100%  Physical Exam  Nursing note and vitals reviewed. Constitutional: She is oriented to person, place, and time. She appears well-developed and well-nourished.  HENT:  Head: Normocephalic and atraumatic.  Nose: Nose normal.  Eyes: Conjunctivae and EOM are normal. No scleral icterus.  Neck: Neck supple. No thyromegaly present.  Cardiovascular: Normal rate, regular rhythm and normal heart sounds.  Exam reveals no gallop and no friction rub.   No murmur heard. Pulmonary/Chest: Effort normal. No stridor. She has no wheezes. She has no rales. She exhibits no tenderness.  Abdominal: She exhibits no distension. There is no tenderness. There is no rebound.  Genitourinary:       Stool black, BRB on stool and separate from stool. guaiac positive.  Musculoskeletal: Normal range of motion. She exhibits edema (3+ in lower extremities  from the knees down. ).  Lymphadenopathy:    She has no cervical adenopathy.  Neurological: She is oriented to person, place, and time. Coordination normal.  Skin: No rash noted. No erythema.  Psychiatric: She has a normal mood and affect. Her behavior is normal.    ED Course  Procedures (including critical care time)  DIAGNOSTIC STUDIES: Oxygen Saturation is 100% on [place], normal by my interpretation.    COORDINATION OF CARE:  Results for orders placed during the hospital encounter of 08/20/11  CBC      Component Value Range   WBC 7.4  4.0 - 10.5 (K/uL)   RBC 3.17 (*) 3.87 - 5.11 (MIL/uL)   Hemoglobin 8.7 (*) 12.0 - 15.0 (g/dL)   HCT 78.2 (*) 95.6 - 46.0 (%)   MCV 83.6  78.0 - 100.0 (fL)   MCH 27.4  26.0 - 34.0 (pg)   MCHC 32.8  30.0 - 36.0 (g/dL)   RDW 21.3 (*) 08.6 - 15.5 (%)   Platelets 215  150 - 400 (K/uL)  BASIC METABOLIC PANEL      Component Value Range   Sodium 140  135 - 145 (mEq/L)   Potassium 3.7  3.5 - 5.1 (mEq/L)   Chloride 104  96 - 112 (mEq/L)   CO2 30  19 - 32 (mEq/L)   Glucose, Bld 125 (*) 70 - 99 (mg/dL)   BUN 16  6 - 23 (mg/dL)   Creatinine, Ser 5.78  0.50 - 1.10 (mg/dL)   Calcium 8.9  8.4 - 46.9 (mg/dL)   GFR calc non Af Amer 54 (*) >90 (mL/min)   GFR calc Af Amer 62 (*) >90 (mL/min)   No results found.   1:55PM- EDP at bedside discusses treatment plan.  1604 4:12 PM:  T/C toDr. Lendell Caprice, case discussed, including:  HPI, pertinent PM/SHx, VS/PE, dx testing, ED course and treatment.  Agreeable to admission to telemetry.  Requests to write temporary orders, telemtry bed . 1608 4:18 PM:  T/C to Dr. Jena Gauss, GI case discussed, including:  HPI, pertinent PM/SHx, VS/PE, dx testing, ED course and treatment.  Will see patient in consult once on the floor. 26 Advised Dr. Lendell Caprice that patient is on Xeralto and baby asa.   MDM  Patient with 4 days of black stools and blood. Hospitalization at [facility] for  Generalized weakness in early February found to have  atrial fibrillation. TEE and cardioversion was done. Hgb at [facility] was 14. Currently is 8.7.  Patient has  received PPI, IVF, type and screen. VSS. Pt stable in ED with no significant deterioration in condition.The patient appears reasonably stabilized for admission considering the current resources, flow, and capabilities available in the ED at this time, and I doubt any other [facility] requiring further screening and/or treatment in the ED prior to admission.  I personally performed the services described in this documentation, which was scribed in my presence. The recorded information has been reviewed and considered.  CRITICAL CARE Performed by: Annamarie Dawley.   Total critical care time: 40  Critical care time was exclusive of separately billable procedures and treating other patients.  Critical care was necessary to treat or prevent imminent or life-threatening deterioration.  Critical care was time spent personally by me on the following activities: development of treatment plan with patient and/or surrogate as well as nursing, discussions with consultants, evaluation of patient's response to treatment, examination of patient, obtaining history from patient or surrogate, ordering and performing treatments and interventions, ordering and review of laboratory studies, ordering and review of radiographic studies, pulse oximetry and re-evaluation of patient's condition.       Nicoletta Dress. Colon Branch, MD 08/20/11 [phone]

## 2011-08-20 NOTE — ED Notes (Signed)
Pt reports her stools have been "black and tarry".

## 2011-08-20 NOTE — ED Notes (Signed)
Called to give report, Leanne RN to call me back

## 2011-08-20 NOTE — H&P (Signed)
Hospital Admission Note Date: 08/20/2011  Patient name: Sharon Compton Medical record number: [id number] Date of birth: 1940/08/22 Age: 71 y.o. Gender: female PCP: [facility] Attending physician: Christiane Ha, MD  Chief Complaint:  Bloody stools  History of Present Illness:  Sharon Compton is an 71 y.o. female with a history of recent hospitalization for atrial fibrillation who presents with a several day history of black stools and bloody stools. She's had no vomiting. She has some vague epigastric discomfort. She has been on rivaroxaban since she was discharged on the ninth of this month. Also listed on her home med rec is aspirin 81 mg a day. She took a few ibuprofen and a few days ago but this is unusual for her she's had no fevers chills. She has chronic dyspnea on exertion. She reports having had an EGD about 3 years ago which reportedly showed ulcers and a hiatal hernia. She had a colonoscopy many years ago which she thinks may have shown polyps. Today, showed a hemoglobin of 8.7. 2 weeks ago, her hemoglobin was 14 at [facility]. Patient has had no chest pain, palpitations or other symptoms.  Past Medical History  Diagnosis Date  . Hypertension   . Anxiety   . Asthma   . Bronchitis   . Fibromyalgia   . Pneumonia   . Edema   . Peptic ulcer disease   . Hiatal hernia   . GERD (gastroesophageal reflux disease)   . A-fib     cardioversion and TEE at [facility]    Meds: Prescriptions prior to admission  Medication Sig Dispense Refill  . ALPRAZolam (XANAX) 1 MG tablet Take 1 mg by mouth 4 (four) times daily as needed. Anxiety      . aspirin 81 MG chewable tablet Chew 81 mg by mouth daily as needed. Chest Pains      . Cholecalciferol (VITAMIN D) 2000 UNITS tablet Take 2,000 Units by mouth daily.      [name] diltiazem (CARDIZEM) 120 MG tablet Take 120 mg by mouth daily.      . fish oil-omega-3 fatty acids 1000 MG capsule Take 1 g by mouth daily.       [name] gabapentin (NEURONTIN) 300 MG capsule Take 300 mg by mouth 3 (three) times daily.        [name] ibuprofen (ADVIL,MOTRIN) 200 MG tablet Take 400 mg by mouth every 6 (six) hours as needed. Headache      . iron polysaccharides (NIFEREX) 150 MG capsule Take 150 mg by mouth daily.        . metoprolol (LOPRESSOR) 50 MG tablet Take 50 mg by mouth 2 (two) times daily.      . Rivaroxaban (XARELTO) 20 MG TABS Take 20 mg by mouth daily.      [name] albuterol (PROVENTIL HFA;VENTOLIN HFA) 108 (90 BASE) MCG/ACT inhaler Inhale 2 puffs into the lungs 4 (four) times daily as needed. For shortness of breath        Allergies: Sulfa antibiotics and Codeine  Social history: Patient is married. Per previous discharge summary, there is a history of spells would be use, but patient declined referral to shelter. She denies smoking drinking or drug use.  Family History  Problem Relation Age of Onset  . Diabetes Mother   . Hypertension Mother   . Cancer Father   . Colon cancer Neg Hx    Past Surgical History  Procedure Date  . Abdominal hysterectomy     partial then  complete  . Cholecystectomy   . Abdominal exploration surgery   . Abd tumor removed     states was 10 lbs, benign  . Knee surgery   . Bladder stent     Review of Systems: Systems reviewed and as per HPI, otherwise negative.  Physical Exam: Blood pressure 168/79, pulse 72, temperature 97.9 F (36.6 C), temperature source Oral, resp. rate 18, height 5' (1.524 m), weight 87.68 kg (193 lb 4.8 oz), SpO2 91.00%. BP 168/79  Pulse 72  Temp(Src) 97.9 F (36.6 C) (Oral)  Resp 18  Ht 5' (1.524 m)  Wt 87.68 kg (193 lb 4.8 oz)  BMI 37.75 kg/m2  SpO2 91%  General Appearance:    Alert, cooperative, no distress, appears stated age  Head:    Normocephalic, without obvious abnormality, atraumatic  Eyes:    PERRL, conjunctiva/corneas clear, EOM's intact, fundi    benign, both eyes, slightly pale conjunctiva   Ears:    Normal TM's and external ear  canals, both ears  Nose:   Nares normal, septum midline, mucosa normal, no drainage    or sinus tenderness  Throat:   Lips, mucosa, and tongue normal; teeth and gums normal  Neck:   Supple, symmetrical, trachea midline, no adenopathy;    thyroid:  no enlargement/tenderness/nodules; no carotid   bruit or JVD  Back:     Symmetric, no curvature, ROM normal, no CVA tenderness  Lungs:     Clear to auscultation bilaterally, respirations unlabored  Chest Wall:    No tenderness or deformity   Heart:    Regular rate and rhythm, S1 and S2 normal, no murmur, rub   or gallop     Abdomen:     Soft, non-tender, bowel sounds active all four quadrants,    no masses, no organomegaly  Genitalia:   deferred   Rectal:   per ED physician showed black stool as well as bright red blood   Extremities:   Extremities normal, atraumatic, edema present.   Pulses:   2+ and symmetric all extremities  Skin:   Skin color, texture, turgor normal, no rashes or lesions  Lymph nodes:   Cervical, supraclavicular, and axillary nodes normal  Neurologic:   CNII-XII intact, normal strength, sensation and reflexes    throughout    Lab results: Basic Metabolic Panel:  Basename 08/20/11 1228  NA 140  K 3.7  CL 104  CO2 30  GLUCOSE 125*  BUN 16  CREATININE 1.03  CALCIUM 8.9  MG --  PHOS --   Liver Function Tests: No results found for this basename: AST:2,ALT:2,ALKPHOS:2,BILITOT:2,PROT:2,ALBUMIN:2 in the last 72 hours No results found for this basename: LIPASE:2,AMYLASE:2 in the last 72 hours No results found for this basename: AMMONIA:2 in the last 72 hours CBC:  Basename 08/20/11 1228  WBC 7.4  NEUTROABS --  HGB 8.7*  HCT 26.5*  MCV 83.6  PLT 215   Cardiac Enzymes:  Basename 08/20/11 1228  CKTOTAL --  CKMB --  CKMBINDEX --  TROPONINI <0.30   EKG shows normal sinus rhythm. Wandering baseline. Nonspecific changes.  Imaging results:  No results found.  Assessment & Plan:   *GI bleed on  rivaroxiban and aspirin   Acute blood loss anemia   Benign hypertension   History of atrial fibrillation, currently in sinus rhythm. Had TEE and cardioversion 2 weeks ago normal ejection fraction   Obesity   Edema  Patient will be admitted to telemetry. GI has already been consulted. Patient will get serial  hemoglobins. Transfuse as needed. Clear liquids for now then n.p.o. after midnight. Proton pump inhibitor every 12 hours. Hold antihypertensives for now, to avoid hypotension in the setting of acute blood loss. Patient is currently in normal sinus rhythm.  Kadeem Hyle L 08/20/2011, 6:11 PM

## 2011-08-20 NOTE — Consult Note (Signed)
Referring Provider: ED Primary Care Physician:  No primary provider on file. Primary Gastroenterologist:  Dr. Jena Gauss   Date of Admission:  Date of Consultation:   Reason for Consultation:  Anemia, rectal bleeding, possible melena  HPI:  Sharon Compton is a 71 year old female who presented to the ED this afternoon after worsening weakness, fatigue, and close to 1.5 weeks of brbpr and possible melena. Was recently at [facility] secondary to afib and underwent cardioversion and TEE. Reports history of ulcers in the past, secondary to aspirin powders. States EGD done at [facility] in the early 90s. Last colonoscopy in the 90s as well, unsure results but knows she had polyps.  Reports dark, gooey, tarry stools mixed with brbpr for close to a week and a half. Denies diarrhea or constipation. +nausea, feels weak. Denies abdominal pain. Decreased satiety. Husband reports decreased appetite and small portions for "years", no wt loss noted. No vomiting. +GERD, +nocturnal GERD. No dysphagia. Stopped taking Nexium about on year ago due to insurance cost. States "stopped on my own". Avoiding NSAIDs and aspirin powders currently. Reports chronic hx of taking po iron, used to stool being dark but not tarry.   On Xarelto prior to admission.   Past Medical History  Diagnosis Date  . Hypertension   . Anxiety   . Asthma   . Bronchitis   . Fibromyalgia   . Pneumonia   . Edema   . Peptic ulcer disease   . Hiatal hernia   . GERD (gastroesophageal reflux disease)   . A-fib     cardioversion and TEE at [facility]    Past Surgical History  Procedure Date  . Abdominal hysterectomy     partial then complete  . Cholecystectomy   . Abdominal exploration surgery   . Abd tumor removed     states was 10 lbs, benign  . Knee surgery   . Bladder stent     Prior to Admission medications   Medication Sig Start Date End Date Taking? Authorizing Provider  ALPRAZolam Prudy Feeler) 1 MG tablet Take 1 mg by mouth 4 (four) times  daily as needed. Anxiety   Yes Historical Provider, MD  aspirin 81 MG chewable tablet Chew 81 mg by mouth daily as needed. Chest Pains   Yes Historical Provider, MD  Cholecalciferol (VITAMIN D) 2000 UNITS tablet Take 2,000 Units by mouth daily.   Yes Historical Provider, MD  diltiazem (CARDIZEM) 120 MG tablet Take 120 mg by mouth daily.   Yes Historical Provider, MD  fish oil-omega-3 fatty acids 1000 MG capsule Take 1 g by mouth daily.   Yes Historical Provider, MD  gabapentin (NEURONTIN) 300 MG capsule Take 300 mg by mouth 3 (three) times daily.     Yes Historical Provider, MD  ibuprofen (ADVIL,MOTRIN) 200 MG tablet Take 400 mg by mouth every 6 (six) hours as needed. Headache   Yes Historical Provider, MD  iron polysaccharides (NIFEREX) 150 MG capsule Take 150 mg by mouth daily.     Yes Historical Provider, MD  metoprolol (LOPRESSOR) 50 MG tablet Take 50 mg by mouth 2 (two) times daily.   Yes Historical Provider, MD  Rivaroxaban (XARELTO) 20 MG TABS Take 20 mg by mouth daily.   Yes Historical Provider, MD  albuterol (PROVENTIL HFA;VENTOLIN HFA) 108 (90 BASE) MCG/ACT inhaler Inhale 2 puffs into the lungs 4 (four) times daily as needed. For shortness of breath    Historical Provider, MD    Current Facility-Administered Medications  Medication Dose Route Frequency Provider Last  Rate Last Dose  . 0.9 %  sodium chloride infusion   Intravenous Once [employer]. Colon Branch, MD 75 mL/hr at 08/20/11 1613    . ondansetron (ZOFRAN) injection 4 mg  4 mg Intravenous Once [employer]. Colon Branch, MD   4 mg at 08/20/11 1614  . pantoprazole (PROTONIX) injection 40 mg  40 mg Intravenous Once [employer]. Colon Branch, MD   40 mg at 08/20/11 1617   Current Outpatient Prescriptions  Medication Sig Dispense Refill  . ALPRAZolam (XANAX) 1 MG tablet Take 1 mg by mouth 4 (four) times daily as needed. Anxiety      . aspirin 81 MG chewable tablet Chew 81 mg by mouth daily as needed. Chest Pains      . Cholecalciferol (VITAMIN D) 2000 UNITS  tablet Take 2,000 Units by mouth daily.      [name] diltiazem (CARDIZEM) 120 MG tablet Take 120 mg by mouth daily.      . fish oil-omega-3 fatty acids 1000 MG capsule Take 1 g by mouth daily.      [name] gabapentin (NEURONTIN) 300 MG capsule Take 300 mg by mouth 3 (three) times daily.        [name] ibuprofen (ADVIL,MOTRIN) 200 MG tablet Take 400 mg by mouth every 6 (six) hours as needed. Headache      . iron polysaccharides (NIFEREX) 150 MG capsule Take 150 mg by mouth daily.        . metoprolol (LOPRESSOR) 50 MG tablet Take 50 mg by mouth 2 (two) times daily.      . Rivaroxaban (XARELTO) 20 MG TABS Take 20 mg by mouth daily.      [name] albuterol (PROVENTIL HFA;VENTOLIN HFA) 108 (90 BASE) MCG/ACT inhaler Inhale 2 puffs into the lungs 4 (four) times daily as needed. For shortness of breath        Allergies as of 08/20/2011 - Review Complete 08/20/2011  Allergen Reaction Noted  . Sulfa antibiotics Itching 06/24/2011  . Codeine Itching and Palpitations 03/25/2011    Family History  Problem Relation Age of Onset  . Diabetes Mother   . Hypertension Mother   . Cancer Father   . Colon cancer Neg Hx     History   Social History  . Marital Status: Married    Spouse Name: N/A    Number of Children: N/A  . Years of Education: N/A   Occupational History  . Not on file.   Social History Main Topics  . Smoking status: Former Smoker -- 1.0 packs/day for 15 years    Types: Cigarettes    Quit date: 06/24/1999  . Smokeless tobacco: Never Used  . Alcohol Use: No  . Drug Use: No  . Sexually Active: No   Other Topics Concern  . Not on file   Social History Narrative  . No narrative on file    Review of Systems: Gen: + decreased po intake, early satiety, + fatigue CV: Denies chest pain, heart palpitations, syncope, edema  Resp: + SOB with exertion, reports feeling mildly SOB at rest currently GI: Denies dysphagia or odynophagia. Denies vomiting blood, jaundice, and fecal incontinence.  GU : Denies  urinary burning, urinary frequency, urinary incontinence.  MS: + joint pain, chronic lower back pain Derm: + dry skin Psych: Denies depression, anxiety,confusion, or memory loss Heme: Denies bruising, bleeding, and enlarged lymph nodes.  Physical Exam: Vital signs in last 24 hours: Temp:  [97.9 F (36.6 C)] 97.9 F (36.6 C) (02/25 1204) Pulse Rate:  [68-70] 68  (  02/25 1412) Resp:  [20] 20  (02/25 1204) BP: (135-153)/(68-73) 153/73 mmHg (02/25 1412) SpO2:  [96 %-100 %] 96 % (02/25 1412)   General:   Alert,  Well-developed, well-nourished, pleasant and cooperative in NAD, wearing make-up and bright orange lipstick Head:  Normocephalic and atraumatic. Eyes:  Sclera clear, no icterus.   Conjunctiva pink. Ears:  Normal auditory acuity. Nose:  No deformity, discharge,  or lesions. Mouth:  No deformity or lesions, dentition normal.  Neck:  Supple; no masses or thyromegaly. Lungs:  Clear throughout to auscultation.   No wheezes, crackles, or rhonchi. No acute distress. Heart:  S1 S2 present; no murmurs, clicks, rubs,  or gallops. Abdomen:  Soft, mildly TTP diffusely over abdomen, pt unable to pinpoint exact location.  nondistended. Obese. No masses, hepatosplenomegaly or hernias noted. Normal bowel sounds, without guarding, and without rebound.   Rectal:  Deferred until time of colonoscopy.   Msk:  Symmetrical without gross deformities. Normal posture. Extremities:  Lower extremity edema, non-pitting, chronic Neurologic:  Alert and  oriented x4;  grossly normal neurologically. Skin:  Intact without significant lesions or rashes. Cervical Nodes:  No significant cervical adenopathy. Psych:  Alert and cooperative. Normal mood and affect.  Intake/Output from previous day:   Intake/Output this shift:    Lab Results:  Basename 08/20/11 1228  WBC 7.4  HGB 8.7*  HCT 26.5*  PLT 215   BMET  Basename 08/20/11 1228  NA 140  K 3.7  CL 104  CO2 30  GLUCOSE 125*  BUN 16  CREATININE  1.03  CALCIUM 8.9    Impression: 71 year old female with black, tarry stools mixed with brbpr for approximately 1 week. Notes hx of PUD in the remote past, secondary to aspirin powders. Ibuprofen listed on home meds but denies routine use of this. Denies aspirin powders currently. Last TCS in remote past as well. No associated abdominal pain but notes "soreness" on physical exam diffusely. Doubt dealing with acute etiology that would require radiological scans. No diarrhea/constipation. +early satiety, nausea, uncontrolled GERD. Stopped taking Nexium 1 year ago. Hgb down to 8.7, was 13 approximately 4 mos ago. Needs upper GI tract evaluation tomorrow with outpatient TCS. Monitor Hgb closely, transfuse as necessary.   At time of consultation, did not realize pt was on Xarelto prior to admission. Likely compounding any source of GI bleed at this time. Will obtain PT/INR now. Continue with tentative plans for EGD in am.   Plan: Stop Xarelto  PPI Serial H/H PT/INR now Plan for EGD with Dr. Darrick Penna on 2/26 TCS as outpatient unless indicated otherwise Transfuse as necessary   LOS: 0 days   Gerrit Halls  08/20/2011, 5:23 PM  I have seen and examined the patient this evening in her room on the third floor. Impression and  plan as outlined above. Clear liquid diet this evening.

## 2011-08-20 NOTE — ED Notes (Signed)
Per ems, pt reports rectal bleeding for the past few days.  Pt reports having a procedure at [facility] and was told that she has "leaky valves".  Pt reports she started having "problems" after that.

## 2011-08-21 ENCOUNTER — Encounter (HOSPITAL_COMMUNITY): Payer: Self-pay | Admitting: Internal Medicine

## 2011-08-21 ENCOUNTER — Encounter (HOSPITAL_COMMUNITY)
Admission: RE | Disposition: A | Discharge status: Home / Self Care | Payer: Self-pay | Source: Home / Self Care | Attending: Internal Medicine

## 2011-08-21 ENCOUNTER — Inpatient Hospital Stay (HOSPITAL_COMMUNITY): Payer: Medicare Other

## 2011-08-21 DIAGNOSIS — J209 Acute bronchitis, unspecified: Secondary | ICD-10-CM | POA: Diagnosis not present

## 2011-08-21 DIAGNOSIS — D62 Acute posthemorrhagic anemia: Secondary | ICD-10-CM | POA: Diagnosis not present

## 2011-08-21 DIAGNOSIS — K921 Melena: Secondary | ICD-10-CM | POA: Diagnosis not present

## 2011-08-21 DIAGNOSIS — K922 Gastrointestinal hemorrhage, unspecified: Secondary | ICD-10-CM | POA: Diagnosis not present

## 2011-08-21 DIAGNOSIS — F419 Anxiety disorder, unspecified: Secondary | ICD-10-CM | POA: Diagnosis present

## 2011-08-21 DIAGNOSIS — R05 Cough: Secondary | ICD-10-CM | POA: Diagnosis not present

## 2011-08-21 DIAGNOSIS — E876 Hypokalemia: Secondary | ICD-10-CM | POA: Diagnosis not present

## 2011-08-21 DIAGNOSIS — D649 Anemia, unspecified: Secondary | ICD-10-CM | POA: Diagnosis not present

## 2011-08-21 LAB — BASIC METABOLIC PANEL
BUN: 13 mg/dL (ref 6–23)
Chloride: 107 mEq/L (ref 96–112)
Creatinine, Ser: 0.93 mg/dL (ref 0.50–1.10)
GFR calc Af Amer: 71 mL/min — ABNORMAL LOW (ref >90)
Glucose, Bld: 127 mg/dL — ABNORMAL HIGH (ref 70–99)
Potassium: 3.4 mEq/L — ABNORMAL LOW (ref 3.5–5.1)

## 2011-08-21 LAB — HEMOGLOBIN AND HEMATOCRIT, BLOOD
HCT: 23.9 % — ABNORMAL LOW (ref 36.0–46.0)
HCT: 24 % — ABNORMAL LOW (ref 36.0–46.0)
HCT: 27 % — ABNORMAL LOW (ref 36.0–46.0)
Hemoglobin: 7.6 g/dL — ABNORMAL LOW (ref 12.0–15.0)
Hemoglobin: 7.9 g/dL — ABNORMAL LOW (ref 12.0–15.0)
Hemoglobin: 8.3 g/dL — ABNORMAL LOW (ref 12.0–15.0)
Hemoglobin: 8.5 g/dL — ABNORMAL LOW (ref 12.0–15.0)

## 2011-08-21 LAB — PROTIME-INR: INR: 1.04 (ref 0.00–1.49)

## 2011-08-21 SURGERY — EGD (ESOPHAGOGASTRODUODENOSCOPY)
Anesthesia: Moderate Sedation

## 2011-08-21 MED ORDER — DILTIAZEM HCL ER COATED BEADS 120 MG PO CP24
120.0000 mg | ORAL_CAPSULE | Freq: Every day | ORAL | Status: DC
  Administered 2011-08-21 – 2011-08-22 (×2): 120 mg via ORAL
  Filled 2011-08-21 (×2): qty 1

## 2011-08-21 MED ORDER — SODIUM CHLORIDE 0.9 % IJ SOLN
INTRAMUSCULAR | Status: AC
  Administered 2011-08-21: 3 mL via INTRAVENOUS
  Filled 2011-08-21: qty 3

## 2011-08-21 MED ORDER — LEVALBUTEROL HCL 0.63 MG/3ML IN NEBU
0.6300 mg | INHALATION_SOLUTION | Freq: Four times a day (QID) | RESPIRATORY_TRACT | Status: DC
  Administered 2011-08-21 – 2011-08-24 (×9): 0.63 mg via RESPIRATORY_TRACT
  Filled 2011-08-21 (×11): qty 3

## 2011-08-21 MED ORDER — POTASSIUM CHLORIDE CRYS ER 20 MEQ PO TBCR
20.0000 meq | EXTENDED_RELEASE_TABLET | Freq: Every day | ORAL | Status: DC
  Administered 2011-08-21 – 2011-08-23 (×3): 20 meq via ORAL
  Filled 2011-08-21 (×3): qty 1

## 2011-08-21 MED ORDER — SODIUM CHLORIDE 0.9 % IJ SOLN
INTRAMUSCULAR | Status: AC
  Administered 2011-08-21: 10 mL
  Filled 2011-08-21: qty 3

## 2011-08-21 MED ORDER — DEXTROSE 5 % IV SOLN
1.0000 g | INTRAVENOUS | Status: DC
  Administered 2011-08-21 – 2011-08-25 (×5): 1 g via INTRAVENOUS
  Filled 2011-08-21 (×6): qty 10

## 2011-08-21 MED ORDER — DEXTROSE 5 % IV SOLN
500.0000 mg | INTRAVENOUS | Status: DC
  Administered 2011-08-21 – 2011-08-24 (×4): 500 mg via INTRAVENOUS
  Filled 2011-08-21 (×6): qty 500

## 2011-08-21 NOTE — Progress Notes (Signed)
  Subjective: Pt full of complaints this morning. Was set up for EGD, yet had not been off Xarelto for several days, last dose yesterday morning. No further melena or brbpr. "One Xanax yesterday, supposed to have 4". Doesn't want EGD today, states wants abx prior to having procedure due to coughing up "green stuff". Started after procedure at [facility]. No N/V, no abdominal pain, wants coffee.    Objective: Vital signs in last 24 hours: Temp:  [97.7 F (36.5 C)-97.9 F (36.6 C)] 97.8 F (36.6 C) (02/26 0500) Pulse Rate:  [68-86] 81  (02/26 0846) Resp:  [18-20] 20  (02/26 0846) BP: (135-168)/(68-81) 135/78 mmHg (02/26 0500) SpO2:  [91 %-100 %] 93 % (02/26 0846) Weight:  [193 lb 4.8 oz (87.68 kg)] 193 lb 4.8 oz (87.68 kg) (02/25 1755) Last BM Date: 08/20/11 General:   Alert and oriented, irritable Head:  Normocephalic and atraumatic. Eyes:  No icterus, sclera clear. Conjuctiva pink.  Mouth:  Without lesions, mucosa pink and moist.  Heart:  S1, S2 present, no murmurs noted.  Lungs: Clear to auscultation bilaterally, without wheezing, rales, or rhonchi.  Abdomen:  Bowel sounds present, soft, TTP epigastrim, non-distended. No HSM or hernias noted. No rebound or guarding. No masses appreciated  Msk:  Symmetrical without gross deformities. Normal posture. Extremities:  Bilateral lower extremity edema, chronic Neurologic:  Alert and  oriented x4;  grossly normal neurologically. Skin:  Warm and dry, intact without significant lesions.    Intake/Output from previous day:   Intake/Output this shift:    Lab Results:  Basename 08/21/11 0546 08/21/11 0540 08/21/11 0003 08/20/11 1228  WBC -- -- -- 7.4  HGB 7.6* 7.9* 8.3* --  HCT 24.0* 23.9* 25.5* --  PLT -- -- -- 215   BMET  Basename 08/21/11 0540 08/20/11 1228  NA 141 140  K 3.4* 3.7  CL 107 104  CO2 28 30  GLUCOSE 127* 125*  BUN 13 16  CREATININE 0.93 1.03  CALCIUM 8.2* 8.9   PT/INR  Basename 08/20/11 1807  LABPROT 13.8    INR 1.04   Assessment: 71 year old female admitted yesterday with anemia, melena mixed with brbpr X 1 week, hx of PUD in remote past secondary to [facility] powders. On Xarelto prior to admission exacerbating any source of GI bleed. Main complaint is of "coughing up green stuff" since TEE and cardioversion at [facility] a few weeks ago. Refusing any procedures until given "abx". Hgb 7.6, appears to be drifting but no further melena/brbpr. May benefit from transfusion. If EGD done this admission, will need to be off Xarelto total of at least 2-3 days. Last dose 2/25 per pt. She is somewhat a poor historian. Will need outpatient TCS.   Plan: Avoid Xarelto for now Continue PPI Follow Hgb, consider transfusion No EGD today Ultimately needs outpatient follow-up for TCS, also EGD if not done this admission  LOS: 1 day   Sharon Compton  08/21/2011, 9:19 AM

## 2011-08-21 NOTE — Progress Notes (Signed)
Subjective: The patient refused EGD this morning because she has a cough with purulent green sputum. She has intermittent nausea but no vomiting. No bright red blood per rectum overnight.  Objective: Vital signs in last 24 hours: Filed Vitals:   08/20/11 1755 08/20/11 2134 08/21/11 0500 08/21/11 0846  BP: 168/79 163/81 135/78   Pulse: 72 86 82 81  Temp: 97.9 F (36.6 C) 97.7 F (36.5 C) 97.8 F (36.6 C)   TempSrc: Oral Oral Oral   Resp: 18 20 20 20   Height: 5' (1.524 m)     Weight: 87.68 kg (193 lb 4.8 oz)     SpO2: 91% 95% 95% 93%    Intake/Output Summary (Last 24 hours) at 08/21/11 1458 Last data filed at 08/21/11 0900  Gross per 24 hour  Intake      0 ml  Output      0 ml  Net      0 ml    Weight change:   Physical exam: Lungs: Occasional wheezes and crackles, mostly upper airway wheezes and prolonged expiratory phase. Breathing is nonlabored. Heart: S1, S2, with soft systolic murmur.. Abdomen: Positive bowel sounds, obese, mildly tender in the epigastrium, nondistended, no rigidity. Extremities: Trace to 1+ bilateral pedal edema.  Lab Results: Basic Metabolic Panel:  Basename 08/21/11 0540 08/20/11 1228  NA 141 140  K 3.4* 3.7  CL 107 104  CO2 28 30  GLUCOSE 127* 125*  BUN 13 16  CREATININE 0.93 1.03  CALCIUM 8.2* 8.9  MG -- --  PHOS -- --   Liver Function Tests: No results found for this basename: AST:2,ALT:2,ALKPHOS:2,BILITOT:2,PROT:2,ALBUMIN:2 in the last 72 hours No results found for this basename: LIPASE:2,AMYLASE:2 in the last 72 hours No results found for this basename: AMMONIA:2 in the last 72 hours CBC:  Basename 08/21/11 1220 08/21/11 0546 08/20/11 1228  WBC -- -- 7.4  NEUTROABS -- -- --  HGB 7.9* 7.6* --  HCT 24.8* 24.0* --  MCV -- -- 83.6  PLT -- -- 215   Cardiac Enzymes:  Basename 08/20/11 1228  CKTOTAL --  CKMB --  CKMBINDEX --  TROPONINI <0.30   BNP: No results found for this basename: PROBNP:3 in the last 72  hours D-Dimer: No results found for this basename: DDIMER:2 in the last 72 hours CBG: No results found for this basename: GLUCAP:6 in the last 72 hours Hemoglobin A1C: No results found for this basename: HGBA1C in the last 72 hours Fasting Lipid Panel: No results found for this basename: CHOL,HDL,LDLCALC,TRIG,CHOLHDL,LDLDIRECT in the last 72 hours Thyroid Function Tests: No results found for this basename: TSH,T4TOTAL,FREET4,T3FREE,THYROIDAB in the last 72 hours Anemia Panel: No results found for this basename: VITAMINB12,FOLATE,FERRITIN,TIBC,IRON,RETICCTPCT in the last 72 hours Coagulation:  Basename 08/20/11 1807  LABPROT 13.8  INR 1.04   Urine Drug Screen: Drugs of Abuse  No results found for this basename: labopia,  cocainscrnur,  labbenz,  amphetmu,  thcu,  labbarb    Alcohol Level: No results found for this basename: ETH:2 in the last 72 hours Urinalysis: No results found for this basename: COLORURINE:2,APPERANCEUR:2,LABSPEC:2,PHURINE:2,GLUCOSEU:2,HGBUR:2,BILIRUBINUR:2,KETONESUR:2,PROTEINUR:2,UROBILINOGEN:2,NITRITE:2,LEUKOCYTESUR:2 in the last 72 hours Misc. Labs:   Micro: No results found for this or any previous visit (from the past 240 hour(s)).  Studies/Results: No results found.  Medications: I have reviewed the patient's current medications.  Assessment: Principal Problem:  *GI bleed Active Problems:  Acute blood loss anemia  History of atrial fibrillation  Obesity  Edema  Benign hypertension  Acute bronchitis  Anxiety disorder  1. GI bleeding/rectal bleeding in the setting of Xarelto and aspirin. EGD canceled due to the patient's request. She is on IV Protonix.  Acute blood loss anemia. We'll continue to monitor her hemoglobin and hematocrit. If her hemoglobin continues to drift downward, we'll transfuse one or 2 units.  Acute bronchitis. We'll start antibiotics and bronchodilator therapy.  History of atrial fibrillation. Per exam, her rhythm is  normal sinus rhythm.  Mild peripheral edema in the lower extremities.  Hypertension. She is treated chronically with diltiazem and metoprolol. Her blood pressure is increasing. Will, therefore restart diltiazem and hold the Toprol for now.    Plan:  1. Will start Rocephin and azithromycin empirically. Will start bronchodilator therapy with Xopenex. We'll order a chest x-ray. 2. We'll type and screen 2 units of packed red blood cells if not done already. 3. Continue to monitor her hemoglobin and hematocrit every 6 hours. If her hemoglobin falls to 7.5 or below, we'll consider transfusing at least one unit of packed red blood cells.   LOS: 1 day   Sharon Compton 08/21/2011, 2:58 PM

## 2011-08-22 DIAGNOSIS — K922 Gastrointestinal hemorrhage, unspecified: Secondary | ICD-10-CM | POA: Diagnosis not present

## 2011-08-22 DIAGNOSIS — D649 Anemia, unspecified: Secondary | ICD-10-CM

## 2011-08-22 DIAGNOSIS — K921 Melena: Secondary | ICD-10-CM

## 2011-08-22 DIAGNOSIS — E876 Hypokalemia: Secondary | ICD-10-CM | POA: Diagnosis not present

## 2011-08-22 DIAGNOSIS — D62 Acute posthemorrhagic anemia: Secondary | ICD-10-CM | POA: Diagnosis not present

## 2011-08-22 DIAGNOSIS — J209 Acute bronchitis, unspecified: Secondary | ICD-10-CM | POA: Diagnosis not present

## 2011-08-22 LAB — HEMOGLOBIN AND HEMATOCRIT, BLOOD
HCT: 27 % — ABNORMAL LOW (ref 36.0–46.0)
Hemoglobin: 8.6 g/dL — ABNORMAL LOW (ref 12.0–15.0)

## 2011-08-22 LAB — CBC
HCT: 26.2 % — ABNORMAL LOW (ref 36.0–46.0)
Hemoglobin: 8.2 g/dL — ABNORMAL LOW (ref 12.0–15.0)
MCV: 86.5 fL (ref 78.0–100.0)
RDW: 16.4 % — ABNORMAL HIGH (ref 11.5–15.5)
WBC: 8.4 10*3/uL (ref 4.0–10.5)

## 2011-08-22 LAB — COMPREHENSIVE METABOLIC PANEL
ALT: 36 U/L — ABNORMAL HIGH (ref 0–35)
Albumin: 2.8 g/dL — ABNORMAL LOW (ref 3.5–5.2)
Alkaline Phosphatase: 88 U/L (ref 39–117)
BUN: 6 mg/dL (ref 6–23)
Chloride: 104 mEq/L (ref 96–112)
Glucose, Bld: 148 mg/dL — ABNORMAL HIGH (ref 70–99)
Potassium: 3.8 mEq/L (ref 3.5–5.1)
Total Bilirubin: 0.2 mg/dL — ABNORMAL LOW (ref 0.3–1.2)

## 2011-08-22 MED ORDER — SODIUM CHLORIDE 0.9 % IJ SOLN
INTRAMUSCULAR | Status: AC
  Administered 2011-08-22: 12:00:00
  Filled 2011-08-22: qty 3

## 2011-08-22 MED ORDER — DILTIAZEM HCL ER COATED BEADS 180 MG PO CP24
180.0000 mg | ORAL_CAPSULE | Freq: Every day | ORAL | Status: DC
  Administered 2011-08-23: 180 mg via ORAL
  Filled 2011-08-22: qty 1

## 2011-08-22 NOTE — Progress Notes (Signed)
Pt not interested in EGD unless her respiratory issues are addressed. No melena. Needs EGD prior to d/c WITH PROPOFOL. Failed conscious sedation in the past-polypharmacy.

## 2011-08-22 NOTE — Progress Notes (Signed)
Subjective:  Patient states she feels terrible. Feels like she is dying. C/O coughing up green stuff still. C/O SOB but clinically able to complete sentences with dyspnea. No further melena, brbpr. No BM since admission. No abdominal pain.   Objective: Vital signs in last 24 hours: Temp:  [97.9 F (36.6 C)-100.3 F (37.9 C)] 100.2 F (37.9 C) (02/27 1342) Pulse Rate:  [98-108] 98  (02/27 1342) Resp:  [22-24] 24  (02/27 1342) BP: (119-162)/(68-78) 119/68 mmHg (02/27 1342) SpO2:  [94 %-95 %] 95 % (02/27 1342) Last BM Date: 08/20/11 General:   Alert,  Elderly, WF in NAD Head:  Normocephalic and atraumatic. Eyes:  Sclera clear, no icterus.  Chest: CTA bilaterally without rales, rhonchi, crackles.    Heart:  Regular rate and rhythm; no murmurs, clicks, rubs,  or gallops. Abdomen:  Soft, nontender and nondistended. Normal bowel sounds, without guarding, and without rebound.   Extremities:  Without clubbing, deformity or edema. Neurologic:  Alert and  oriented x4;  grossly normal neurologically. Skin:  Intact without significant lesions or rashes. Psych:  Alert and cooperative. Normal mood and affect.  Intake/Output from previous day: 02/26 0701 - 02/27 0700 In: 660 [P.O.:660] Out: 450 [Urine:450] Intake/Output this shift: Total I/O In: 600 [P.O.:600] Out: 300 [Urine:300]  Lab Results: CBC  Basename 08/22/11 1158 08/22/11 0542 08/21/11 2355 08/20/11 1228  WBC -- -- -- 7.4  HGB 8.2* 8.6* 8.6* --  HCT 25.5* 27.0* 27.0* --  MCV -- -- -- 83.6  PLT -- -- -- 215   BMET  Basename 08/22/11 0542 08/21/11 0540 08/20/11 1228  NA 139 141 140  K 3.8 3.4* 3.7  CL 104 107 104  CO2 29 28 30   GLUCOSE 148* 127* 125*  BUN 6 13 16   CREATININE 0.86 0.93 1.03  CALCIUM 8.5 8.2* 8.9   LFTs  Basename 08/22/11 0542  BILITOT 0.2*  BILIDIR --  IBILI --  ALKPHOS 88  AST 42*  ALT 36*  PROT 6.1  ALBUMIN 2.8*   No results found for this basename: LIPASE:3 in the last 72  hours PT/INR  Basename 08/20/11 1807  LABPROT 13.8  INR 1.04      Imaging Studies: Dg Chest 2 View  08/21/2011  *RADIOLOGY REPORT*  Clinical Data: Productive cough  CHEST - 2 VIEW  Comparison: 06/24/2011  Findings: Cardiomediastinal silhouette is stable.  No acute infiltrate or pleural effusion.  No pulmonary edema.  Bony thorax is stable.  IMPRESSION: No active disease.  No significant change.  Original Report Authenticated By: Natasha Mead, M.D.  [2 weeks]   Assessment: 71 y/o female admitted with anemia, melena mixed with brbrp for one week, h/o PUD in remote past secondary to [facility] powders. On Xarelto prior to admission. Productive cough with normal CXR. H/H stable.   Plan: 1. Consider EGD in next 24-48 hours if patient agreeable. Not interested in discussing today. 2. LFTs, CBC tomorrow. 3. TCS as outpatient.    LOS: 2 days   Tana Coast  08/22/2011, 2:31 PM

## 2011-08-22 NOTE — Progress Notes (Signed)
Subjective: The patient continues to have a cough with productive green sputum. No subjective fever chills. No bowel movement this morning. No nausea or vomiting this morning.  Objective: Vital signs in last 24 hours: Filed Vitals:   08/21/11 2052 08/21/11 2149 08/22/11 0428 08/22/11 0809  BP:  158/78 162/78   Pulse:  108 108   Temp:  97.9 F (36.6 C) 100.3 F (37.9 C)   TempSrc:  Oral Oral   Resp:  22 22   Height:      Weight:      SpO2: 95% 94% 94% 94%    Intake/Output Summary (Last 24 hours) at 08/22/11 1233 Last data filed at 08/22/11 0836  Gross per 24 hour  Intake    640 ml  Output    750 ml  Net   -110 ml    Weight change:   Physical exam: Lungs: Occasional wheezes and crackles mostly upper airway wheezes and prolonged expiratory phase. Breathing is nonlabored. Heart: S1, S2, with soft systolic murmur.. Abdomen: Positive bowel sounds, obese, mildly tender in the epigastrium, nondistended, no rigidity. Extremities: Trace to 1+ bilateral pedal edema.  Lab Results: Basic Metabolic Panel:  Basename 08/22/11 0542 08/21/11 0540  NA 139 141  K 3.8 3.4*  CL 104 107  CO2 29 28  GLUCOSE 148* 127*  BUN 6 13  CREATININE 0.86 0.93  CALCIUM 8.5 8.2*  MG -- --  PHOS -- --   Liver Function Tests:  [facility] 08/22/11 0542  AST 42*  ALT 36*  ALKPHOS 88  BILITOT 0.2*  PROT 6.1  ALBUMIN 2.8*   No results found for this basename: LIPASE:2,AMYLASE:2 in the last 72 hours No results found for this basename: AMMONIA:2 in the last 72 hours CBC:  Basename 08/22/11 1158 08/22/11 0542 08/20/11 1228  WBC -- -- 7.4  NEUTROABS -- -- --  HGB 8.2* 8.6* --  HCT 25.5* 27.0* --  MCV -- -- 83.6  PLT -- -- 215   Cardiac Enzymes:  Basename 08/20/11 1228  CKTOTAL --  CKMB --  CKMBINDEX --  TROPONINI <0.30   BNP: No results found for this basename: PROBNP:3 in the last 72 hours D-Dimer: No results found for this basename: DDIMER:2 in the last 72 hours CBG: No results  found for this basename: GLUCAP:6 in the last 72 hours Hemoglobin A1C: No results found for this basename: HGBA1C in the last 72 hours Fasting Lipid Panel: No results found for this basename: CHOL,HDL,LDLCALC,TRIG,CHOLHDL,LDLDIRECT in the last 72 hours Thyroid Function Tests: No results found for this basename: TSH,T4TOTAL,FREET4,T3FREE,THYROIDAB in the last 72 hours Anemia Panel: No results found for this basename: VITAMINB12,FOLATE,FERRITIN,TIBC,IRON,RETICCTPCT in the last 72 hours Coagulation:  Basename 08/20/11 1807  LABPROT 13.8  INR 1.04   Urine Drug Screen: Drugs of Abuse  No results found for this basename: labopia,  cocainscrnur,  labbenz,  amphetmu,  thcu,  labbarb    Alcohol Level: No results found for this basename: ETH:2 in the last 72 hours Urinalysis: No results found for this basename: COLORURINE:2,APPERANCEUR:2,LABSPEC:2,PHURINE:2,GLUCOSEU:2,HGBUR:2,BILIRUBINUR:2,KETONESUR:2,PROTEINUR:2,UROBILINOGEN:2,NITRITE:2,LEUKOCYTESUR:2 in the last 72 hours Misc. Labs:   Micro: No results found for this or any previous visit (from the past 240 hour(s)).  Studies/Results: Dg Chest 2 View  08/21/2011  *RADIOLOGY REPORT*  Clinical Data: Productive cough  CHEST - 2 VIEW  Comparison: 06/24/2011  Findings: Cardiomediastinal silhouette is stable.  No acute infiltrate or pleural effusion.  No pulmonary edema.  Bony thorax is stable.  IMPRESSION: No active disease.  No significant change.  Original Report Authenticated By: Natasha Mead, M.D.    Medications: I have reviewed the patient's current medications.  Assessment: Principal Problem:  *GI bleed Active Problems:  Acute blood loss anemia  History of atrial fibrillation  Obesity  Edema  Benign hypertension  Acute bronchitis  Anxiety disorder  Elevated LFTs  1. GI bleeding/rectal bleeding in the setting of Xarelto and aspirin. EGD canceled yesterday due to the patient's request. She is on IV Protonix. I believe the patient  is medically and clinically ready and appropriate for EGD evaluation.  Acute blood loss anemia. We'll continue to monitor her hemoglobin and hematocrit. If her hemoglobin continues to drift downward, we'll transfuse one or 2 units.  Acute bronchitis. Her chest x-ray was negative for edema and infiltrate. Rocephin, azithromycin, and Xopenex nebulizer was started yesterday.[name]  History of atrial fibrillation. Per exam, her rhythm is normal sinus rhythm. Her rate is starting to increase.  Mild peripheral edema in the lower extremities.  Hypertension. She is treated chronically with diltiazem and metoprolol. Her blood pressure is increasing. Cardizem and metoprolol were started yesterday.    Plan:  We'll increase the dosing of Cardizem to 180 mg daily. EGD pending per GI. Change CBC monitoring to twice a day.   LOS: 2 days   Chriselda Leppert 08/22/2011, 12:33 PM

## 2011-08-22 NOTE — Progress Notes (Signed)
CARE MANAGEMENT NOTE 08/22/2011  Patient:  Sharon Compton, Sharon Compton   Account Number:  [id number]  Date Initiated:  08/22/2011  Documentation initiated by:  Rosemary Holms  Subjective/Objective Assessment:   pt admitted with GI bleed. Lives at home with spouse.     Action/Plan:   Spoke with pt at bedside. She does not feel very good for the last two days and CM stated she would come back to discuss any identified HH needs as she progresses to DC   Anticipated DC Date:  08/28/2011   Anticipated DC Plan:        DC Planning Services  CM consult      Choice offered to / List presented to:             Status of service:  In process, will continue to follow Medicare Important Message given?   (If response is "NO", the following Medicare IM given date fields will be blank) Date Medicare IM given:   Date Additional Medicare IM given:    Discharge Disposition:    Per UR Regulation:    Comments:  08/22/11 1300 Zaeda Mcferran Leanord Hawking RN BSN CM

## 2011-08-23 DIAGNOSIS — R609 Edema, unspecified: Secondary | ICD-10-CM | POA: Diagnosis present

## 2011-08-23 DIAGNOSIS — J209 Acute bronchitis, unspecified: Secondary | ICD-10-CM | POA: Diagnosis not present

## 2011-08-23 DIAGNOSIS — D649 Anemia, unspecified: Secondary | ICD-10-CM | POA: Diagnosis not present

## 2011-08-23 DIAGNOSIS — D62 Acute posthemorrhagic anemia: Secondary | ICD-10-CM | POA: Diagnosis not present

## 2011-08-23 DIAGNOSIS — K922 Gastrointestinal hemorrhage, unspecified: Secondary | ICD-10-CM | POA: Diagnosis not present

## 2011-08-23 DIAGNOSIS — I10 Essential (primary) hypertension: Secondary | ICD-10-CM | POA: Diagnosis not present

## 2011-08-23 DIAGNOSIS — K921 Melena: Secondary | ICD-10-CM | POA: Diagnosis not present

## 2011-08-23 DIAGNOSIS — R600 Localized edema: Secondary | ICD-10-CM | POA: Diagnosis present

## 2011-08-23 LAB — HEPATIC FUNCTION PANEL
ALT: 25 U/L (ref 0–35)
AST: 24 U/L (ref 0–37)
Alkaline Phosphatase: 78 U/L (ref 39–117)
Bilirubin, Direct: <0.1 mg/dL (ref 0.0–0.3)
Total Bilirubin: 0.3 mg/dL (ref 0.3–1.2)

## 2011-08-23 LAB — CBC
MCH: 26.8 pg (ref 26.0–34.0)
MCHC: 31.4 g/dL (ref 30.0–36.0)
MCHC: 33 g/dL (ref 30.0–36.0)
MCV: 85.3 fL (ref 78.0–100.0)
Platelets: 201 10*3/uL (ref 150–400)
Platelets: 192 10*3/uL (ref 150–400)
RBC: 2.65 MIL/uL — ABNORMAL LOW (ref 3.87–5.11)
RDW: 16.3 % — ABNORMAL HIGH (ref 11.5–15.5)
RDW: 16.2 % — ABNORMAL HIGH (ref 11.5–15.5)
WBC: 7.5 10*3/uL (ref 4.0–10.5)

## 2011-08-23 LAB — ABO/RH: ABO/RH(D): B POS

## 2011-08-23 MED ORDER — PROMETHAZINE HCL 25 MG/ML IJ SOLN
12.5000 mg | Freq: Once | INTRAMUSCULAR | Status: AC
  Administered 2011-08-24: 12.5 mg via INTRAVENOUS
  Filled 2011-08-23: qty 1

## 2011-08-23 MED ORDER — ZOLPIDEM TARTRATE 5 MG PO TABS
5.0000 mg | ORAL_TABLET | Freq: Once | ORAL | Status: AC
  Administered 2011-08-23: 5 mg via ORAL
  Filled 2011-08-23: qty 1

## 2011-08-23 MED ORDER — SODIUM CHLORIDE 0.9 % IJ SOLN
INTRAMUSCULAR | Status: AC
  Administered 2011-08-23: 10 mL
  Filled 2011-08-23: qty 3

## 2011-08-23 MED ORDER — POTASSIUM CHLORIDE CRYS ER 20 MEQ PO TBCR
20.0000 meq | EXTENDED_RELEASE_TABLET | Freq: Two times a day (BID) | ORAL | Status: AC
  Administered 2011-08-23: 20 meq via ORAL
  Filled 2011-08-23: qty 1

## 2011-08-23 MED ORDER — FUROSEMIDE 10 MG/ML IJ SOLN
20.0000 mg | Freq: Once | INTRAMUSCULAR | Status: AC
  Administered 2011-08-23: 20 mg via INTRAVENOUS
  Filled 2011-08-23: qty 2

## 2011-08-23 MED ORDER — SODIUM CHLORIDE 0.9 % IJ SOLN
INTRAMUSCULAR | Status: AC
  Administered 2011-08-23: 18:00:00
  Filled 2011-08-23: qty 3

## 2011-08-23 MED ORDER — FUROSEMIDE 10 MG/ML IJ SOLN
10.0000 mg | Freq: Once | INTRAMUSCULAR | Status: AC
  Administered 2011-08-23: 10 mg via INTRAVENOUS
  Filled 2011-08-23 (×2): qty 2

## 2011-08-23 NOTE — Progress Notes (Signed)
Patient o2 sat 85% on RA, mild labored breathing noted, applied 02 at 2l - o2 sat 96%, patient states she feels better

## 2011-08-23 NOTE — Progress Notes (Signed)
Subjective:  Coughing up green stuff but feels some better. Tolerated clear liquids. No n/v. No BM since day of admission. No abd pain. Appears very comfortable today. More talkative.   Objective: Vital signs in last 24 hours: Temp:  [97.4 F (36.3 C)-100.2 F (37.9 C)] 98.8 F (37.1 C) (02/28 0551) Pulse Rate:  [69-101] 101  (02/28 0551) Resp:  [20-24] 20  (02/28 0551) BP: (114-146)/(60-72) 146/60 mmHg (02/28 0928) SpO2:  [90 %-100 %] 95 % (02/28 0810) FiO2 (%):  [21 %] 21 % (02/28 0810) Last BM Date: 08/20/11 General:   Alert,  Well-developed, well-nourished, pleasant and cooperative in NAD Head:  Normocephalic and atraumatic. Eyes:  Sclera clear, no icterus.  Chest: CTA bilaterally without rales, rhonchi, crackles.    Heart:  Regular rate and rhythm; no murmurs, clicks, rubs,  or gallops. Abdomen:  Soft, nontender and nondistended.   Normal bowel sounds, without guarding, and without rebound.   Extremities:  Without clubbing, deformity or edema. Neurologic:  Alert and  oriented x4;  grossly normal neurologically. Skin:  Intact without significant lesions or rashes. Psych:  Alert and cooperative. Normal mood and affect.  Intake/Output from previous day: 02/27 0701 - 02/28 0700 In: 3737.5 [P.O.:600; I.V.:2437.5; IV Piggyback:700] Out: 1100 [Urine:1100] Intake/Output this shift:    Lab Results: CBC  Basename 08/23/11 0652 08/22/11 1821 08/22/11 1158 08/20/11 1228  WBC 7.8 8.4 -- 7.4  HGB 7.1* 8.2* 8.2* --  HCT 22.6* 26.2* 25.5* --  MCV 85.3 86.5 -- 83.6  PLT 201 225 -- 215   BMET  Basename 08/22/11 0542 08/21/11 0540 08/20/11 1228  NA 139 141 140  K 3.8 3.4* 3.7  CL 104 107 104  CO2 29 28 30   GLUCOSE 148* 127* 125*  BUN 6 13 16   CREATININE 0.86 0.93 1.03  CALCIUM 8.5 8.2* 8.9   LFTs  Basename 08/23/11 0652 08/22/11 0542  BILITOT 0.3 0.2*  BILIDIR <0.1 --  IBILI NOT CALCULATED --  ALKPHOS 78 88  AST 24 42*  ALT 25 36*  PROT 5.6* 6.1  ALBUMIN 2.5* 2.8*     No results found for this basename: LIPASE:3 in the last 72 hours PT/INR  Basename 08/20/11 1807  LABPROT 13.8  INR 1.04     Assessment: 71 y/o female admitted with anemia, melena mixed with brbpr for one week. No overt bleeding since admission. Xarelto on hold. Hgb with one gram drop since yesterday. Patient finally agreeable to EGD.   Plan: 1. Consider blood transfusion today. Patient is agreeable. Discuss with Dr. Sherrie Mustache. 2. EGD tomorrow. 3. CBC tomorrow. 4. Outpatient colonoscopy.    LOS: 3 days   Tana Coast  08/23/2011, 9:35 AM

## 2011-08-23 NOTE — Progress Notes (Signed)
Subjective: She says that her cough is much better now. She denies bloody stools or black tarry stools overnight. She denies hematemesis, nausea, or vomiting.  She complains of swelling in her arms and legs. This is somewhat chronic but worse over the past 24 hours.     Objective: Vital signs in last 24 hours: Filed Vitals:   08/23/11 0457 08/23/11 0551 08/23/11 0810 08/23/11 0928  BP:  114/64  146/60  Pulse:  101    Temp: 100.1 F (37.8 C) 98.8 F (37.1 C)    TempSrc: Oral Oral    Resp:  20    Height:      Weight:      SpO2:  96% 95%     Intake/Output Summary (Last 24 hours) at 08/23/11 1044 Last data filed at 08/22/11 1900  Gross per 24 hour  Intake 3397.5 ml  Output    800 ml  Net 2597.5 ml    Weight change:   Physical exam: Lungs: Occasional wheezes and crackles mostly upper airway wheezes and prolonged expiratory phase. Breathing is nonlabored. Heart: S1, S2, with soft systolic murmur.. Abdomen: Positive bowel sounds, obese, mildly tender in the epigastrium, nondistended, no rigidity. Extremities: Trace to 1+ bilateral pedal edema and of upper extremities .  Lab Results: Basic Metabolic Panel:  Basename 08/22/11 0542 08/21/11 0540  NA 139 141  K 3.8 3.4*  CL 104 107  CO2 29 28  GLUCOSE 148* 127*  BUN 6 13  CREATININE 0.86 0.93  CALCIUM 8.5 8.2*  MG -- --  PHOS -- --   Liver Function Tests:  Basename 08/23/11 0652 08/22/11 0542  AST 24 42*  ALT 25 36*  ALKPHOS 78 88  BILITOT 0.3 0.2*  PROT 5.6* 6.1  ALBUMIN 2.5* 2.8*   No results found for this basename: LIPASE:2,AMYLASE:2 in the last 72 hours No results found for this basename: AMMONIA:2 in the last 72 hours CBC:  Basename 08/23/11 0652 08/22/11 1821  WBC 7.8 8.4  NEUTROABS -- --  HGB 7.1* 8.2*  HCT 22.6* 26.2*  MCV 85.3 86.5  PLT 201 225   Cardiac Enzymes:  Basename 08/20/11 1228  CKTOTAL --  CKMB --  CKMBINDEX --  TROPONINI <0.30   BNP: No results found for this basename:  PROBNP:3 in the last 72 hours D-Dimer: No results found for this basename: DDIMER:2 in the last 72 hours CBG: No results found for this basename: GLUCAP:6 in the last 72 hours Hemoglobin A1C: No results found for this basename: HGBA1C in the last 72 hours Fasting Lipid Panel: No results found for this basename: CHOL,HDL,LDLCALC,TRIG,CHOLHDL,LDLDIRECT in the last 72 hours Thyroid Function Tests: No results found for this basename: TSH,T4TOTAL,FREET4,T3FREE,THYROIDAB in the last 72 hours Anemia Panel: No results found for this basename: VITAMINB12,FOLATE,FERRITIN,TIBC,IRON,RETICCTPCT in the last 72 hours Coagulation:  Basename 08/20/11 1807  LABPROT 13.8  INR 1.04   Urine Drug Screen: Drugs of Abuse  No results found for this basename: labopia,  cocainscrnur,  labbenz,  amphetmu,  thcu,  labbarb    Alcohol Level: No results found for this basename: ETH:2 in the last 72 hours Urinalysis: No results found for this basename: COLORURINE:2,APPERANCEUR:2,LABSPEC:2,PHURINE:2,GLUCOSEU:2,HGBUR:2,BILIRUBINUR:2,KETONESUR:2,PROTEINUR:2,UROBILINOGEN:2,NITRITE:2,LEUKOCYTESUR:2 in the last 72 hours Misc. Labs:   Micro: No results found for this or any previous visit (from the past 240 hour(s)).  Studies/Results: Dg Chest 2 View  08/21/2011  *RADIOLOGY REPORT*  Clinical Data: Productive cough  CHEST - 2 VIEW  Comparison: 06/24/2011  Findings: Cardiomediastinal silhouette is stable.  No acute  infiltrate or pleural effusion.  No pulmonary edema.  Bony thorax is stable.  IMPRESSION: No active disease.  No significant change.  Original Report Authenticated By: Natasha Mead, M.D.    Medications: I have reviewed the patient's current medications.  Assessment: Principal Problem:  *GI bleed Active Problems:  Acute blood loss anemia  History of atrial fibrillation  Obesity  Benign hypertension  Acute bronchitis  Anxiety disorder  Elevated LFTs  Peripheral edema  1. GI bleeding/rectal bleeding  in the setting of Xarelto and aspirin. EGD is pending for tomorrow.  She is on IV Protonix.    2.Acute blood loss anemia. her hemoglobin has drifted downward to 7.1. She will be transfused.    3.Acute bronchitis. Her chest x-ray was negative for edema and infiltrate. Rocephin, azithromycin, and Xopenex nebulizer was started yesterday.[name]   4.History of atrial fibrillation. Per exam, her rhythm is normal sinus rhythm. Her rate was increasing, and therefore, Cardizem was increased.    5.Mild peripheral edema in her upper and  lower extremities.   6.Hypertension. She is treated chronically with diltiazem and metoprolol, both were restarted.     Plan:  1. Transfuse 2 units of packed red blood cells. 2. Lasix before and between transfusions to treat peripheral edema. 3. EGD tomorrow per gastroenterology.    LOS: 3 days   Azam Gervasi 08/23/2011, 10:44 AM

## 2011-08-24 ENCOUNTER — Encounter (HOSPITAL_COMMUNITY): Payer: Self-pay | Admitting: *Deleted

## 2011-08-24 ENCOUNTER — Encounter (HOSPITAL_COMMUNITY)
Admission: RE | Disposition: A | Discharge status: Home / Self Care | Payer: Self-pay | Source: Home / Self Care | Attending: Internal Medicine

## 2011-08-24 DIAGNOSIS — D62 Acute posthemorrhagic anemia: Secondary | ICD-10-CM | POA: Diagnosis not present

## 2011-08-24 DIAGNOSIS — K922 Gastrointestinal hemorrhage, unspecified: Secondary | ICD-10-CM | POA: Diagnosis not present

## 2011-08-24 DIAGNOSIS — K921 Melena: Secondary | ICD-10-CM

## 2011-08-24 DIAGNOSIS — K449 Diaphragmatic hernia without obstruction or gangrene: Secondary | ICD-10-CM

## 2011-08-24 DIAGNOSIS — J209 Acute bronchitis, unspecified: Secondary | ICD-10-CM | POA: Diagnosis not present

## 2011-08-24 DIAGNOSIS — I10 Essential (primary) hypertension: Secondary | ICD-10-CM | POA: Diagnosis not present

## 2011-08-24 HISTORY — PX: ESOPHAGOGASTRODUODENOSCOPY: SHX5428

## 2011-08-24 LAB — CBC
HCT: 32.7 % — ABNORMAL LOW (ref 36.0–46.0)
Hemoglobin: 10.6 g/dL — ABNORMAL LOW (ref 12.0–15.0)
MCH: 27.1 pg (ref 26.0–34.0)
MCH: 27.1 pg (ref 26.0–34.0)
MCHC: 32.4 g/dL (ref 30.0–36.0)
MCHC: 32.2 g/dL (ref 30.0–36.0)
MCV: 84.2 fL (ref 78.0–100.0)
Platelets: 198 10*3/uL (ref 150–400)
Platelets: 202 10*3/uL (ref 150–400)
RBC: 3.98 MIL/uL (ref 3.87–5.11)
RDW: 16.6 % — ABNORMAL HIGH (ref 11.5–15.5)
RDW: 16.8 % — ABNORMAL HIGH (ref 11.5–15.5)
WBC: 5.9 10*3/uL (ref 4.0–10.5)

## 2011-08-24 LAB — TYPE AND SCREEN
Antibody Screen: NEGATIVE
Unit division: 0

## 2011-08-24 LAB — BASIC METABOLIC PANEL
BUN: 6 mg/dL (ref 6–23)
Chloride: 102 mEq/L (ref 96–112)
Creatinine, Ser: 0.85 mg/dL (ref 0.50–1.10)
Glucose, Bld: 125 mg/dL — ABNORMAL HIGH (ref 70–99)
Potassium: 3.6 mEq/L (ref 3.5–5.1)

## 2011-08-24 SURGERY — EGD (ESOPHAGOGASTRODUODENOSCOPY)
Anesthesia: Moderate Sedation

## 2011-08-24 MED ORDER — MIDAZOLAM HCL 5 MG/5ML IJ SOLN
INTRAMUSCULAR | Status: AC
  Filled 2011-08-24: qty 10

## 2011-08-24 MED ORDER — PEG 3350-KCL-NA BICARB-NACL 420 G PO SOLR
4000.0000 mL | Freq: Once | ORAL | Status: AC
  Administered 2011-08-24: 4000 mL via ORAL
  Filled 2011-08-24: qty 4000

## 2011-08-24 MED ORDER — SODIUM CHLORIDE 0.9 % IV SOLN: Freq: Once | INTRAVENOUS | Status: DC

## 2011-08-24 MED ORDER — PROMETHAZINE HCL 25 MG/ML IJ SOLN
INTRAMUSCULAR | Status: AC
  Filled 2011-08-24: qty 1

## 2011-08-24 MED ORDER — DILTIAZEM HCL ER COATED BEADS 120 MG PO CP24
120.0000 mg | ORAL_CAPSULE | Freq: Every day | ORAL | Status: DC
  Administered 2011-08-24 – 2011-08-26 (×3): 120 mg via ORAL
  Filled 2011-08-24 (×3): qty 1

## 2011-08-24 MED ORDER — BISACODYL 5 MG PO TBEC
10.0000 mg | DELAYED_RELEASE_TABLET | Freq: Once | ORAL | Status: AC
  Administered 2011-08-24: 10 mg via ORAL
  Filled 2011-08-24: qty 2

## 2011-08-24 MED ORDER — PANTOPRAZOLE SODIUM 40 MG PO TBEC
40.0000 mg | DELAYED_RELEASE_TABLET | Freq: Two times a day (BID) | ORAL | Status: DC
  Administered 2011-08-24 – 2011-08-26 (×4): 40 mg via ORAL
  Filled 2011-08-24 (×4): qty 1

## 2011-08-24 MED ORDER — MIDAZOLAM HCL 5 MG/5ML IJ SOLN
INTRAMUSCULAR | Status: DC | PRN
  Administered 2011-08-24 (×3): 2 mg via INTRAVENOUS

## 2011-08-24 MED ORDER — SODIUM CHLORIDE 0.45 % IV SOLN
INTRAVENOUS | Status: DC
  Administered 2011-08-24: 10:00:00 via INTRAVENOUS

## 2011-08-24 MED ORDER — MEPERIDINE HCL 100 MG/ML IJ SOLN
INTRAMUSCULAR | Status: AC
  Filled 2011-08-24: qty 2

## 2011-08-24 MED ORDER — SODIUM CHLORIDE 0.9 % IJ SOLN
INTRAMUSCULAR | Status: AC
  Filled 2011-08-24: qty 10

## 2011-08-24 MED ORDER — MEPERIDINE HCL 100 MG/ML IJ SOLN
INTRAMUSCULAR | Status: DC | PRN
  Administered 2011-08-24 (×3): 50 mg via INTRAVENOUS

## 2011-08-24 MED ORDER — BUTAMBEN-TETRACAINE-BENZOCAINE 2-2-14 % EX AERO
INHALATION_SPRAY | CUTANEOUS | Status: DC | PRN
  Administered 2011-08-24: 2 via TOPICAL

## 2011-08-24 MED ORDER — STERILE WATER FOR IRRIGATION IR SOLN
Status: DC | PRN
  Administered 2011-08-24: 10:00:00

## 2011-08-24 MED ORDER — PROMETHAZINE HCL 25 MG/ML IJ SOLN
12.5000 mg | Freq: Once | INTRAMUSCULAR | Status: AC
  Administered 2011-08-24: 12.5 mg via INTRAVENOUS

## 2011-08-24 NOTE — Op Note (Signed)
[facility] [address] [place], [place]  [address]  ENDOSCOPY PROCEDURE REPORT  PATIENT:  Sharon, Compton  MR#:  [id number] BIRTHDATE:  14-Jun-1941, 70 yrs. old  GENDER:  female  ENDOSCOPIST:  R. Roetta Sessions, MD FACP [facility] Referred by:          Dr. Lendell Caprice  PROCEDURE DATE:  08/24/2011 PROCEDURE:  Diagnostic EGD  INDICATIONS:   melena and hematochezia in the setting of recent anticoagulation and antiplatelet therapy  INFORMED CONSENT:   The risks, benefits, limitations, alternatives and imponderables have been discussed.  The potential for biopsy, esophogeal dilation, etc. have also been reviewed.  Questions have been answered.  All parties agreeable.  Please see the history and physical in the medical record for more information.  MEDICATIONS:  Versed 6 mg IV and Demerol 150 mg IV in divided doses. Phenergan 12.5 mg IV to augment conscious sedation  DESCRIPTION OF PROCEDURE:   The XB-[id number] ([id number]) endoscope was introduced through the mouth and advanced to the second portion of the duodenum without difficulty or limitations.  The mucosal surfaces were surveyed very carefully during advancement of the scope and upon withdrawal.  Retroflexion view of the proximal stomach and esophagogastric junction was performed.  <<PROCEDUREIMAGES>>  FINDINGS:     Normal esophagus. Stomach empty. Small hiatal hernia. 2 small innocent appearing pyloric channel erosions ( see image 7).  Normal first and second portion of the duodenum  THERAPEUTIC / DIAGNOSTIC MANEUVERS PERFORMED:  None  COMPLICATIONS:   None  IMPRESSION:   Small hiatal hernia. 2 innocent appearing pyloric channel erosions otherwise normal examination.  RECOMMENDATIONS:   Clear liquid diet. If anticoagulation is going to be needed in the near future, I feel it would be in the patient's best interest to go ahead and have a colonoscopy while she is here. If not, we'll plan to get this done in the short  run as an outpatient. Will check H. pylori serologies. Will discuss with the hospitalist.  Continue PPI. Agree with transfusion of 1 unit packed blood cells.  ______________________________ R. Roetta Sessions, MD Caleen Essex  CC:  n. eSIGNED:   R. Roetta Sessions at 08/24/2011 10:55 AM  Sharon Compton, [phone]

## 2011-08-24 NOTE — Progress Notes (Signed)
Subjective:  Her only complaint is lack of sleep. She denies black tarry stools or bright red blood per rectum. Her cough is about the same.    Objective: Vital signs in last 24 hours: Filed Vitals:   08/23/11 2105 08/23/11 2109 08/24/11 0609 08/24/11 0803  BP: 120/70  118/78   Pulse: 88  87   Temp: 98 F (36.7 C)  98.6 F (37 C)   TempSrc: Oral  Oral   Resp: 18  18   Height:      Weight:      SpO2: 92% 95%  95%    Intake/Output Summary (Last 24 hours) at 08/24/11 1914 Last data filed at 08/24/11 0300  Gross per 24 hour  Intake   2298 ml  Output   1300 ml  Net    998 ml    Weight change:   Physical exam: Lungs: Mostly upper airway wheezes, and possibly pseudo-wheezing. Breathing is nonlabored. Heart: S1, S2, with soft systolic murmur.. Abdomen: Positive bowel sounds, obese, mildly tender in the epigastrium, nondistended, no rigidity. Extremities: Trace bilateral pedal edema and of upper extremities .  Lab Results: Basic Metabolic Panel:  Basename 08/24/11 0452 08/22/11 0542  NA 142 139  K 3.6 3.8  CL 102 104  CO2 33* 29  GLUCOSE 125* 148*  BUN 6 6  CREATININE 0.85 0.86  CALCIUM 8.8 8.5  MG -- --  PHOS -- --   Liver Function Tests:  Basename 08/23/11 0652 08/22/11 0542  AST 24 42*  ALT 25 36*  ALKPHOS 78 88  BILITOT 0.3 0.2*  PROT 5.6* 6.1  ALBUMIN 2.5* 2.8*   No results found for this basename: LIPASE:2,AMYLASE:2 in the last 72 hours No results found for this basename: AMMONIA:2 in the last 72 hours CBC:  Basename 08/24/11 0803 08/24/11 0452  WBC 5.5 5.9  NEUTROABS -- --  HGB 10.6* 10.6*  HCT 32.7* 33.1*  MCV 83.6 83.2  PLT 177 198   Cardiac Enzymes: No results found for this basename: CKTOTAL:3,CKMB:3,CKMBINDEX:3,TROPONINI:3 in the last 72 hours BNP: No results found for this basename: PROBNP:3 in the last 72 hours D-Dimer: No results found for this basename: DDIMER:2 in the last 72 hours CBG: No results found for this basename:  GLUCAP:6 in the last 72 hours Hemoglobin A1C: No results found for this basename: HGBA1C in the last 72 hours Fasting Lipid Panel: No results found for this basename: CHOL,HDL,LDLCALC,TRIG,CHOLHDL,LDLDIRECT in the last 72 hours Thyroid Function Tests: No results found for this basename: TSH,T4TOTAL,FREET4,T3FREE,THYROIDAB in the last 72 hours Anemia Panel: No results found for this basename: VITAMINB12,FOLATE,FERRITIN,TIBC,IRON,RETICCTPCT in the last 72 hours Coagulation: No results found for this basename: LABPROT:2,INR:2 in the last 72 hours Urine Drug Screen: Drugs of Abuse  No results found for this basename: labopia,  cocainscrnur,  labbenz,  amphetmu,  thcu,  labbarb    Alcohol Level: No results found for this basename: ETH:2 in the last 72 hours Urinalysis: No results found for this basename: COLORURINE:2,APPERANCEUR:2,LABSPEC:2,PHURINE:2,GLUCOSEU:2,HGBUR:2,BILIRUBINUR:2,KETONESUR:2,PROTEINUR:2,UROBILINOGEN:2,NITRITE:2,LEUKOCYTESUR:2 in the last 72 hours Misc. Labs:   Micro: No results found for this or any previous visit (from the past 240 hour(s)).  Studies/Results: No results found.  Medications: I have reviewed the patient's current medications.  Assessment: Principal Problem:  *GI bleed Active Problems:  Acute blood loss anemia  History of atrial fibrillation  Obesity  Benign hypertension  Acute bronchitis  Anxiety disorder  Elevated LFTs  Peripheral edema  1. GI bleeding/rectal bleeding in the setting of Xarelto and aspirin. EGD  is pending for tomorrow.  She is on IV Protonix.    2.Acute blood loss anemia. Her hemoglobin has drifted downward to 7.1. She was transfused, 2 units blood. Hemoglobin improved appropriately to 10. It is now 10.6.   3.Acute bronchitis. Her chest x-ray was negative for edema and infiltrate. Rocephin, azithromycin, and Xopenex nebulizer was started yesterday.Sharon Compton   4.History of atrial fibrillation. Per exam, her rhythm is normal sinus  rhythm. Her rate was increasing, and therefore, Cardizem was increased.    5.Mild peripheral edema in her upper and  lower extremities. She was given IV Lasix prior to and between units of packed red blood cells.   6.Hypertension. She is treated chronically with diltiazem and metoprolol, both were restarted.     Plan:  1. Plan EGD this afternoon. 2. As blood pressure has drifted downward, will decrease Cardizem back to prehospital dose of 120 mg. 3. Continue to monitor it CBC twice a day and as needed.   LOS: 4 days   Sharon Compton 08/24/2011, 9:03 AM

## 2011-08-25 ENCOUNTER — Encounter (HOSPITAL_COMMUNITY): Payer: Self-pay | Admitting: *Deleted

## 2011-08-25 ENCOUNTER — Encounter (HOSPITAL_COMMUNITY)
Admission: RE | Disposition: A | Discharge status: Home / Self Care | Payer: Self-pay | Source: Home / Self Care | Attending: Internal Medicine

## 2011-08-25 DIAGNOSIS — K5520 Angiodysplasia of colon without hemorrhage: Secondary | ICD-10-CM | POA: Diagnosis not present

## 2011-08-25 DIAGNOSIS — D62 Acute posthemorrhagic anemia: Secondary | ICD-10-CM | POA: Diagnosis not present

## 2011-08-25 DIAGNOSIS — J209 Acute bronchitis, unspecified: Secondary | ICD-10-CM | POA: Diagnosis not present

## 2011-08-25 DIAGNOSIS — K922 Gastrointestinal hemorrhage, unspecified: Secondary | ICD-10-CM | POA: Diagnosis not present

## 2011-08-25 DIAGNOSIS — I10 Essential (primary) hypertension: Secondary | ICD-10-CM | POA: Diagnosis not present

## 2011-08-25 DIAGNOSIS — K5730 Diverticulosis of large intestine without perforation or abscess without bleeding: Secondary | ICD-10-CM

## 2011-08-25 HISTORY — PX: COLONOSCOPY: SHX5424

## 2011-08-25 LAB — CBC
Hemoglobin: 9.9 g/dL — ABNORMAL LOW (ref 12.0–15.0)
MCH: 26.9 pg (ref 26.0–34.0)
MCHC: 31.7 g/dL (ref 30.0–36.0)
MCV: 84.8 fL (ref 78.0–100.0)
RBC: 3.68 MIL/uL — ABNORMAL LOW (ref 3.87–5.11)

## 2011-08-25 SURGERY — COLONOSCOPY
Anesthesia: Moderate Sedation

## 2011-08-25 MED ORDER — MEPERIDINE HCL 100 MG/ML IJ SOLN
INTRAMUSCULAR | Status: AC
  Filled 2011-08-25: qty 2

## 2011-08-25 MED ORDER — STERILE WATER FOR IRRIGATION IR SOLN
Status: DC | PRN
  Administered 2011-08-25: 09:00:00

## 2011-08-25 MED ORDER — PROMETHAZINE HCL 25 MG/ML IJ SOLN
INTRAMUSCULAR | Status: AC
  Filled 2011-08-25: qty 1

## 2011-08-25 MED ORDER — MEPERIDINE HCL 100 MG/ML IJ SOLN
INTRAMUSCULAR | Status: DC | PRN
  Administered 2011-08-25 (×2): 50 mg via INTRAVENOUS
  Administered 2011-08-25 (×2): 25 mg via INTRAVENOUS

## 2011-08-25 MED ORDER — AZITHROMYCIN 250 MG PO TABS
500.0000 mg | ORAL_TABLET | Freq: Every day | ORAL | Status: DC
  Administered 2011-08-25 – 2011-08-26 (×2): 500 mg via ORAL
  Filled 2011-08-25 (×2): qty 2

## 2011-08-25 MED ORDER — MIDAZOLAM HCL 5 MG/5ML IJ SOLN
INTRAMUSCULAR | Status: DC | PRN
  Administered 2011-08-25: 1 mg via INTRAVENOUS
  Administered 2011-08-25: 2 mg via INTRAVENOUS
  Administered 2011-08-25 (×3): 1 mg via INTRAVENOUS
  Administered 2011-08-25: 2 mg via INTRAVENOUS

## 2011-08-25 MED ORDER — MIDAZOLAM HCL 5 MG/5ML IJ SOLN
INTRAMUSCULAR | Status: AC
  Filled 2011-08-25: qty 10

## 2011-08-25 NOTE — Progress Notes (Signed)
Notified Dr. Jena Gauss to see if the patients diet could be progressed.  New orders given and followed.  Voiced to the MD that this time the patient was alert and eager to eat.

## 2011-08-25 NOTE — Progress Notes (Signed)
Subjective: No further bleeding. No new complaints  Objective: Vital signs in last 24 hours: Temp:  [97.6 F (36.4 C)-98.2 F (36.8 C)] 97.6 F (36.4 C) (03/02 0457) Pulse Rate:  [86-98] 89  (03/02 0925) Resp:  [9-21] 9  (03/02 0925) BP: (106-162)/(52-137) 106/64 mmHg (03/02 0925) SpO2:  [92 %-100 %] 92 % (03/02 0925) Weight change:  Last BM Date: 08/24/11  Intake/Output from previous day: 03/01 0701 - 03/02 0700 In: 880 [P.O.:880] Out: 175 [Urine:175]     Physical Exam: General: Alert, awake, oriented x3, in no acute distress. HEENT: No bruits, no goiter. Heart: Regular rate and rhythm, without murmurs, rubs, gallops. Lungs: Clear to auscultation bilaterally. Abdomen: Soft, nontender, nondistended, positive bowel sounds. Extremities: chronic 2+ edema in legs Neuro: Grossly intact, nonfocal.    Lab Results: Basic Metabolic Panel:  Basename 08/24/11 0452  NA 142  K 3.6  CL 102  CO2 33*  GLUCOSE 125*  BUN 6  CREATININE 0.85  CALCIUM 8.8  MG --  PHOS --   Liver Function Tests:  Basename 08/23/11 0652  AST 24  ALT 25  ALKPHOS 78  BILITOT 0.3  PROT 5.6*  ALBUMIN 2.5*   No results found for this basename: LIPASE:2,AMYLASE:2 in the last 72 hours No results found for this basename: AMMONIA:2 in the last 72 hours CBC:  Basename 08/25/11 1000 08/24/11 1804  WBC 5.7 7.3  NEUTROABS -- --  HGB 9.9* 10.3*  HCT 31.2* 32.0*  MCV 84.8 84.2  PLT 183 202   Cardiac Enzymes: No results found for this basename: CKTOTAL:3,CKMB:3,CKMBINDEX:3,TROPONINI:3 in the last 72 hours BNP: No results found for this basename: PROBNP:3 in the last 72 hours D-Dimer: No results found for this basename: DDIMER:2 in the last 72 hours CBG: No results found for this basename: GLUCAP:6 in the last 72 hours Hemoglobin A1C: No results found for this basename: HGBA1C in the last 72 hours Fasting Lipid Panel: No results found for this basename:  CHOL,HDL,LDLCALC,TRIG,CHOLHDL,LDLDIRECT in the last 72 hours Thyroid Function Tests: No results found for this basename: TSH,T4TOTAL,FREET4,T3FREE,THYROIDAB in the last 72 hours Anemia Panel: No results found for this basename: VITAMINB12,FOLATE,FERRITIN,TIBC,IRON,RETICCTPCT in the last 72 hours Coagulation: No results found for this basename: LABPROT:2,INR:2 in the last 72 hours Urine Drug Screen: Drugs of Abuse  No results found for this basename: labopia, cocainscrnur, labbenz, amphetmu, thcu, labbarb    Alcohol Level: No results found for this basename: ETH:2 in the last 72 hours Urinalysis: No results found for this basename: COLORURINE:2,APPERANCEUR:2,LABSPEC:2,PHURINE:2,GLUCOSEU:2,HGBUR:2,BILIRUBINUR:2,KETONESUR:2,PROTEINUR:2,UROBILINOGEN:2,NITRITE:2,LEUKOCYTESUR:2 in the last 72 hours  No results found for this or any previous visit (from the past 240 hour(s)).  Studies/Results: No results found.  Medications: Scheduled Meds:   . sodium chloride   Intravenous Once  . azithromycin  500 mg Oral Daily  . bisacodyl  10 mg Oral Once  . cefTRIAXone (ROCEPHIN)  IV  1 g Intravenous Q24H  . diltiazem  120 mg Oral Daily  . gabapentin  300 mg Oral TID  . levalbuterol  0.63 mg Nebulization Q6H  . meperidine      . meperidine      . midazolam      . midazolam      . pantoprazole  40 mg Oral BID AC  . polyethylene glycol-electrolytes  4,000 mL Oral Once  . promethazine      . promethazine      . sodium chloride  3 mL Intravenous Q12H  . sodium chloride      . DISCONTD:  azithromycin  500 mg Intravenous Q24H  . DISCONTD: pantoprazole (PROTONIX) IV  40 mg Intravenous Q12H   Continuous Infusions:   . sodium chloride 50 mL/hr (08/24/11 1725)   PRN Meds:.acetaminophen, acetaminophen, albuterol, ALPRAZolam, metoprolol, morphine, ondansetron (ZOFRAN) IV, ondansetron, DISCONTD: meperidine, DISCONTD: midazolam, DISCONTD: simethicone susp in sterile water 1000 mL  irrigation  Assessment/Plan:  Principal Problem:  *GI bleed Active Problems:  Acute blood loss anemia  History of atrial fibrillation  Obesity  Benign hypertension  Acute bronchitis  Anxiety disorder  Elevated LFTs  Peripheral edema  1. GI bleeding/rectal bleeding in the setting of Xarelto and aspirin. Colonoscopy done today shows multiple cecal AVMs which were treated.  Will advance diet, recheck hemoglobin in am, if stable, can likely discharge home.  2.Acute blood loss anemia. Her hemoglobin has drifted downward to 7.1. She was transfused, 2 units blood. Hemoglobin improved appropriately to 10. It is now 9.9.  Will recheck in am, if stable, can likely discharge home.  3.Acute bronchitis. Her chest x-ray was negative for edema and infiltrate. Rocephin, azithromycin, and Xopenex nebulizer were started for the patient,  Change to po on discharge.  4.History of atrial fibrillation. Per exam, her rhythm is normal sinus rhythm. Currently rate controlled, no anticoagulation for one week.  5.Chronic peripheral edema in her upper and lower extremities. She was given IV Lasix prior to and between units of packed red blood cells. Will likely need to be on outpatient lasix.  Further work up to be done as an outpatient.  She has had an extensive cardiac evaluation.  6.Hypertension. She is treated chronically with diltiazem and metoprolol, both were restarted.   7. Dispo, likely discharge home tomorrow if hemoglobin stable.   LOS: 5 days   Prestin Munch [facility] Pager: [phone] 08/25/2011, 12:48 PM

## 2011-08-25 NOTE — Progress Notes (Signed)
Clinical Social Worker completed brief psychosocial assessment.  Assessment in Shadow chart.  Parth Mccormac J Edwinna Rochette MSW, LCSW [phone]  

## 2011-08-25 NOTE — Op Note (Signed)
[facility] [address] [place], [place]  [address]  COLONOSCOPY PROCEDURE REPORT  PATIENT:  Sharon Compton, Sharon Compton  MR#:  [id number] BIRTHDATE:  19-Sep-1940, 70 yrs. old  GENDER:  female ENDOSCOPIST:  R. Roetta Sessions, MD FACP [facility] REF. BY:          Hospitalist PROCEDURE DATE:  08/25/2011 PROCEDURE:  Colonoscopy with ablation cecal AVMs  INDICATIONS:  GI bleed; essentially negative EGD  INFORMED CONSENT:  The risks, benefits, alternatives and imponderables including but not limited to bleeding, perforation as well as the possibility of a missed lesion have been reviewed. The potential for biopsy, lesion removal, etc. have also been discussed.  Questions have been answered.  All parties agreeable. Please see the history and physical in the medical record for more information.  MEDICATIONS:  Demerol 100 mg IV and Versed 8 mg IV in divided doses  DESCRIPTION OF PROCEDURE:  After a digital rectal exam was performed, the EC-[id number] ([id number]) colonoscope was advanced from the anus through the rectum and colon to the area of the cecum, ileocecal valve and appendiceal orifice.  The cecum was deeply intubated.  These structures were well-seen and photographed for the record.  From the level of the cecum and ileocecal valve, the scope was slowly and cautiously withdrawn.  The mucosal surfaces were carefully surveyed utilizing scope tip deflection to facilitate fold flattening as needed.  The scope was pulled down into the rectum where a thorough examination including retroflexion was performed. <<PROCEDUREIMAGES>>  FINDINGS:   Suboptimal preparation. Normal rectum. Sigmoid diverticulosis. Multiple cecal AVMs.  THERAPEUTIC / DIAGNOSTIC MANEUVERS PERFORMED:   Utilizing the APC with the circular probe, multiple cecal AVMs were sealed. These lesions had a tendency to bleed as they were manipulated.  COMPLICATIONS:  None  CECAL WITHDRAWAL TIME: 22 minutes  IMPRESSION:    Colonic  diverticulosis. Multiple cecal AVMs-suspect lesion as far as cause of bleeding is concerned - status post APC thermal       sealing  RECOMMENDATIONS:  No anticoagulation or antiplatelet therapy  x1 week. Continue proton pump inhibitor therapy. Follow up H. pylori serologies.  If this lady were to rebleed in the future from cecal AVMs, consider angiographic embolization versus right hemicolectomy.  Continue PPI.  ______________________________ R. Roetta Sessions, MD [facility] Essex  CC:  n. eSIGNED:   R. Roetta Sessions at 08/25/2011 09:37 AM  Ardine Eng, [phone]

## 2011-08-26 DIAGNOSIS — K922 Gastrointestinal hemorrhage, unspecified: Secondary | ICD-10-CM

## 2011-08-26 DIAGNOSIS — I10 Essential (primary) hypertension: Secondary | ICD-10-CM | POA: Diagnosis not present

## 2011-08-26 DIAGNOSIS — D62 Acute posthemorrhagic anemia: Secondary | ICD-10-CM | POA: Diagnosis not present

## 2011-08-26 DIAGNOSIS — J209 Acute bronchitis, unspecified: Secondary | ICD-10-CM | POA: Diagnosis not present

## 2011-08-26 LAB — CBC
HCT: 34 % — ABNORMAL LOW (ref 36.0–46.0)
MCHC: 31.5 g/dL (ref 30.0–36.0)
MCV: 85 fL (ref 78.0–100.0)
Platelets: 213 10*3/uL (ref 150–400)
RDW: 16.3 % — ABNORMAL HIGH (ref 11.5–15.5)

## 2011-08-26 MED ORDER — RIVAROXABAN 20 MG PO TABS: 20.0000 mg | ORAL_TABLET | Freq: Every day | ORAL | Status: DC

## 2011-08-26 MED ORDER — FUROSEMIDE 20 MG PO TABS: 20.0000 mg | ORAL_TABLET | Freq: Two times a day (BID) | ORAL | Status: DC

## 2011-08-26 MED ORDER — PANTOPRAZOLE SODIUM 40 MG PO TBEC: 40.0000 mg | DELAYED_RELEASE_TABLET | Freq: Two times a day (BID) | ORAL | Status: DC

## 2011-08-26 MED ORDER — ASPIRIN 81 MG PO CHEW: 81.0000 mg | CHEWABLE_TABLET | Freq: Every day | ORAL | Status: DC | PRN

## 2011-08-26 NOTE — Discharge Summary (Signed)
Physician Discharge Summary  Patient ID: DAYANA DALPORTO MRN: [id number] DOB/AGE: 09/15/1940 71 y.o.  Admit date: 08/20/2011 Discharge date: 08/26/2011  Primary Care Physician:  [facility]   Discharge Diagnoses:    Principal Problem:  *GI bleed secondary to cecal AVMs s/p ablation  Active Problems:  Acute blood loss anemia  History of atrial fibrillation on anticoagulation  Obesity  Benign hypertension  Anxiety disorder  Elevated LFTs, resolved Chronic pedal edema    Medication List  As of 08/26/2011  1:09 PM   STOP taking these medications         ibuprofen 200 MG tablet         TAKE these medications         albuterol 108 (90 BASE) MCG/ACT inhaler   Commonly known as: PROVENTIL HFA;VENTOLIN HFA   Inhale 2 puffs into the lungs 4 (four) times daily as needed. For shortness of breath      ALPRAZolam 1 MG tablet   Commonly known as: XANAX   Take 1 mg by mouth 4 (four) times daily as needed. Anxiety      aspirin 81 MG chewable tablet   Chew 1 tablet (81 mg total) by mouth daily as needed. Chest Pains  Ok to restart on 3/10      diltiazem 120 MG tablet   Commonly known as: CARDIZEM   Take 120 mg by mouth daily.      fish oil-omega-3 fatty acids 1000 MG capsule   Take 1 g by mouth daily.      furosemide 20 MG tablet   Commonly known as: LASIX   Take 1 tablet (20 mg total) by mouth 2 (two) times daily.      gabapentin 300 MG capsule   Commonly known as: NEURONTIN   Take 300 mg by mouth 3 (three) times daily.      iron polysaccharides 150 MG capsule   Commonly known as: NIFEREX   Take 150 mg by mouth daily.      metoprolol 50 MG tablet   Commonly known as: LOPRESSOR   Take 50 mg by mouth 2 (two) times daily.      pantoprazole 40 MG tablet   Commonly known as: PROTONIX   Take 1 tablet (40 mg total) by mouth 2 (two) times daily before a meal.      Rivaroxaban 20 MG Tabs   Take 20 mg by mouth daily. Ok to restart on 09/02/11     Vitamin D 2000 UNITS tablet   Take 2,000 Units by mouth daily.           Discharge Exam: no complaints Blood pressure 146/76, pulse 86, temperature 97.9 F (36.6 C), temperature source Oral, resp. rate 18, height 5' (1.524 m), weight 87.544 kg (193 lb), SpO2 91.00%. NAD CTA B S1, S2, RRR Soft, NT, BS+ 2+ edema in feet b/l   Disposition and Follow-up:  Follow up with primary doctor in 1-2 weeks  Consults:  GI, Dr. Jena Gauss   Significant Diagnostic Studies:  No results found.  Brief H and P: For complete details please refer to admission H and P, but in brief JORDYNE POEHLMAN is an 71 y.o. female with a history of recent hospitalization for atrial fibrillation who presents with a several day history of black stools and bloody stools. She's had no vomiting. She has some vague epigastric discomfort. She has been on rivaroxaban since she was discharged on the ninth of this month. Also listed on her  home med rec is aspirin 81 mg a day. She took a few ibuprofen and a few days ago but this is unusual for her she's had no fevers chills. She has chronic dyspnea on exertion. She reports having had an EGD about 3 years ago which reportedly showed ulcers and a hiatal hernia. She had a colonoscopy many years ago which she thinks may have shown polyps. Today, showed a hemoglobin of 8.7. 2 weeks ago, her hemoglobin was 14 at [facility]. Patient has had no chest pain, palpitations or other symptoms.   Hospital Course:  Patient was admitted to the hospital with black tarry stools.  She is chronically on aspirin and xarelto for a fib. These were held on admission.  Patient was seen by Dr. Jena Gauss.  She underwent endoscopy which revealed gastric erosions. H Pylori serologies were sent.  She then had a colonoscopy which revealed cecal AVMs.  This was felt to be the source of bleeding.  These were ablated.  Patient has not had any further bleeding and her hemoglobin is stable.  It was recommended  that if patient rebleeds from cecal AVMS in the future, then angiographic embolization versus right hemicolectomy may need to be considered.  BID PPI were recommended.  It was also recommended not to resume xarelto for one week.  Patient was explained this.  She was transfused a total of 2 units PRBC during this hospitalization.  Her hemoglobin is currently stable.  She has chronic pedal edema for the last 2 years, which may be from venous stasis.  She reports being prescribed compression hose before.  We will start low dose lasix for this and ask her to follow up with her primary doctor.  She will be discharged home today.  Time spent on Discharge:  Signed: MEMON,JEHANZEB [facility] Pager: [phone] 08/26/2011, 1:09 PM

## 2011-08-26 NOTE — Discharge Instructions (Signed)
Gastrointestinal Bleeding Gastrointestinal (GI) bleeding is bleeding from the gut or any place between your mouth and anus. If bleeding is slow, you may be allowed to go home. If there is a lot of bleeding, hospitalization and observation are often required. SYMPTOMS   You vomit bright red blood or material that looks like coffee grounds.   You have blood in your stools or the stools look black and tarry.  DIAGNOSIS  Your caregiver may diagnose your condition by taking a history and a physical exam. More tests may be needed, including:  X-rays.   EGD (esophagogastroduodenoscopy), which looks at your esophagus, stomach, and small bowel through a flexible telescope-like instrument.   Colonoscopy, which looks at your colon/large bowel through a flexible telescope-like instrument.   Biopsies, which remove a small sample of tissue to examine under a microscope.  Finding out the results of your test Not all test results are available during your visit. If your test results are not back during the visit, make an appointment with your caregiver to find out the results. Do not assume everything is normal if you have not heard from your caregiver or the medical facility. It is important for you to follow up on all of your test results. HOME CARE INSTRUCTIONS   Follow instructions as suggested by your caregiver regarding medicines. Do not take aspirin, drink alcohol, or take medicines for pain and arthritis unless your caregiver says it is okay.   Get the suggested follow-up care when the tests are done.  SEEK IMMEDIATE MEDICAL CARE IF:   Your bleeding increases or you become lightheaded, weak, or pass out (faint).   You experience severe cramps in your stomach, back, or belly (abdomen).   You pass large clots.   The problems which brought you in for medical care get worse.  MAKE SURE YOU:   Understand these instructions.   Will watch your condition.   Will get help right away if you are  not doing well or get worse.  Document Released: 06/08/2000 Document Revised: 05/31/2011 Document Reviewed: 05/21/2011 [employer] Patient Information 2012 [employer]. 

## 2011-08-26 NOTE — Progress Notes (Signed)
Subjective: Tolerating diet. No melena or hematochezia. Hemoglobin stable. Patient's only complaint this morning and she cannot find her cell phone  . Objective: Vital signs in last 24 hours: Temp:  [97.6 F (36.4 C)-97.9 F (36.6 C)] 97.9 F (36.6 C) (03/03 0517) Pulse Rate:  [81-88] 86  (03/03 0517) Resp:  [15-18] 18  (03/03 0517) BP: (104-146)/(66-77) 146/76 mmHg (03/03 0517) SpO2:  [91 %-96 %] 91 % (03/03 0517) Last BM Date: 08/25/11 General:   Alert,  Well-developed, well-nourished, pleasant and cooperative in NAD Abdomen:  Soft, nontender and nondistended.  Normal bowel sounds, without guarding, and without rebound.  No mass or organomegaly. Extremities:  Without clubbing or edema.      Intake/Output from previous day: 03/02 0701 - 03/03 0700 In: 100 [IV Piggyback:100] Out: -  Intake/Output this shift:    Lab Results:  Basename 08/26/11 0359 08/25/11 1000 08/24/11 1804  WBC 5.0 5.7 7.3  HGB 10.7* 9.9* 10.3*  HCT 34.0* 31.2* 32.0*  PLT 213 183 202   H. pylori serology is pending BMET  Basename 08/24/11 0452  NA 142  K 3.6  CL 102  CO2 33*  GLUCOSE 125*  BUN 6  CREATININE 0.85  CALCIUM 8.8     Impression: GI bleed secondary most likely to cecal AVMs post ablation yesterday. Gastric erosions on EGD. HP serologies pending  Recommendations: Continue PPI. Followup on HP serologies. No anticoagulation for at least one week  I anticipate she will be discharged the same.    LOS: 6 days   Eula Listen  08/26/2011, 11:12 AM

## 2011-08-29 ENCOUNTER — Encounter (HOSPITAL_COMMUNITY): Payer: Self-pay | Admitting: Internal Medicine

## 2011-09-12 DIAGNOSIS — I10 Essential (primary) hypertension: Secondary | ICD-10-CM | POA: Diagnosis not present

## 2011-09-12 DIAGNOSIS — I4891 Unspecified atrial fibrillation: Secondary | ICD-10-CM | POA: Diagnosis not present

## 2011-09-24 HISTORY — PX: CARDIOVASCULAR STRESS TEST: SHX262

## 2011-09-24 HISTORY — PX: TRANSTHORACIC ECHOCARDIOGRAM: SHX275

## 2011-09-27 ENCOUNTER — Telehealth: Payer: Self-pay

## 2011-09-27 NOTE — Telephone Encounter (Signed)
Pt called with multiple questions about her procedures. Answered all questions pt had and informed her that her hpylori blood work was negative. Pt stated she was have issues with dizziness and wanted to know if the protonix that she was put on would cause that. Informed pt that she needed to call her cardiologist. Pt said her heart was still doing the same thing it was doing when she was in the hospital. She is going to call cardiologist and let him know she is dizzy and get further recommendations from him. If she gets worse she will go to ED.

## 2011-09-28 NOTE — Telephone Encounter (Signed)
Unless she is having melena or brbpr, dizziness not likely GI. Unlikely due to protonix. Agree with need to touch base with cardiologist given history of AFib.

## 2011-09-28 NOTE — Telephone Encounter (Signed)
Late entry- I forgot to put in original note that pt stated she has not noticed any rectal bleeding.

## 2011-10-01 ENCOUNTER — Inpatient Hospital Stay (HOSPITAL_COMMUNITY)
Admission: EM | Admit: 2011-10-01 | Discharge: 2011-10-06 | DRG: 377 | Disposition: A | Discharge status: Home / Self Care | Payer: Medicare Other | Attending: Internal Medicine | Admitting: Internal Medicine

## 2011-10-01 ENCOUNTER — Other Ambulatory Visit: Payer: Self-pay

## 2011-10-01 ENCOUNTER — Encounter (HOSPITAL_COMMUNITY): Payer: Self-pay

## 2011-10-01 DIAGNOSIS — D62 Acute posthemorrhagic anemia: Secondary | ICD-10-CM | POA: Diagnosis present

## 2011-10-01 DIAGNOSIS — Z8679 Personal history of other diseases of the circulatory system: Secondary | ICD-10-CM

## 2011-10-01 DIAGNOSIS — E785 Hyperlipidemia, unspecified: Secondary | ICD-10-CM | POA: Diagnosis present

## 2011-10-01 DIAGNOSIS — K922 Gastrointestinal hemorrhage, unspecified: Secondary | ICD-10-CM

## 2011-10-01 DIAGNOSIS — K219 Gastro-esophageal reflux disease without esophagitis: Secondary | ICD-10-CM | POA: Diagnosis present

## 2011-10-01 DIAGNOSIS — R112 Nausea with vomiting, unspecified: Secondary | ICD-10-CM | POA: Diagnosis not present

## 2011-10-01 DIAGNOSIS — M79601 Pain in right arm: Secondary | ICD-10-CM | POA: Diagnosis present

## 2011-10-01 DIAGNOSIS — R079 Chest pain, unspecified: Secondary | ICD-10-CM | POA: Diagnosis not present

## 2011-10-01 DIAGNOSIS — F411 Generalized anxiety disorder: Secondary | ICD-10-CM | POA: Diagnosis present

## 2011-10-01 DIAGNOSIS — J45909 Unspecified asthma, uncomplicated: Secondary | ICD-10-CM | POA: Diagnosis not present

## 2011-10-01 DIAGNOSIS — F419 Anxiety disorder, unspecified: Secondary | ICD-10-CM | POA: Diagnosis present

## 2011-10-01 DIAGNOSIS — Z79899 Other long term (current) drug therapy: Secondary | ICD-10-CM

## 2011-10-01 DIAGNOSIS — R7309 Other abnormal glucose: Secondary | ICD-10-CM | POA: Diagnosis present

## 2011-10-01 DIAGNOSIS — K5520 Angiodysplasia of colon without hemorrhage: Secondary | ICD-10-CM

## 2011-10-01 DIAGNOSIS — Z7901 Long term (current) use of anticoagulants: Secondary | ICD-10-CM

## 2011-10-01 DIAGNOSIS — Z8711 Personal history of peptic ulcer disease: Secondary | ICD-10-CM

## 2011-10-01 DIAGNOSIS — Z8701 Personal history of pneumonia (recurrent): Secondary | ICD-10-CM

## 2011-10-01 DIAGNOSIS — I498 Other specified cardiac arrhythmias: Secondary | ICD-10-CM | POA: Diagnosis not present

## 2011-10-01 DIAGNOSIS — Z800 Family history of malignant neoplasm of digestive organs: Secondary | ICD-10-CM

## 2011-10-01 DIAGNOSIS — K625 Hemorrhage of anus and rectum: Secondary | ICD-10-CM

## 2011-10-01 DIAGNOSIS — K5790 Diverticulosis of intestine, part unspecified, without perforation or abscess without bleeding: Secondary | ICD-10-CM | POA: Diagnosis present

## 2011-10-01 DIAGNOSIS — R609 Edema, unspecified: Secondary | ICD-10-CM | POA: Diagnosis present

## 2011-10-01 DIAGNOSIS — D649 Anemia, unspecified: Secondary | ICD-10-CM | POA: Diagnosis not present

## 2011-10-01 DIAGNOSIS — E861 Hypovolemia: Secondary | ICD-10-CM | POA: Diagnosis not present

## 2011-10-01 DIAGNOSIS — Z8719 Personal history of other diseases of the digestive system: Secondary | ICD-10-CM

## 2011-10-01 DIAGNOSIS — R9389 Abnormal findings on diagnostic imaging of other specified body structures: Secondary | ICD-10-CM | POA: Diagnosis not present

## 2011-10-01 DIAGNOSIS — E669 Obesity, unspecified: Secondary | ICD-10-CM

## 2011-10-01 DIAGNOSIS — K449 Diaphragmatic hernia without obstruction or gangrene: Secondary | ICD-10-CM | POA: Diagnosis present

## 2011-10-01 DIAGNOSIS — K5521 Angiodysplasia of colon with hemorrhage: Secondary | ICD-10-CM | POA: Diagnosis not present

## 2011-10-01 DIAGNOSIS — Z6839 Body mass index (BMI) 39.0-39.9, adult: Secondary | ICD-10-CM

## 2011-10-01 DIAGNOSIS — I10 Essential (primary) hypertension: Secondary | ICD-10-CM | POA: Diagnosis not present

## 2011-10-01 DIAGNOSIS — I219 Acute myocardial infarction, unspecified: Secondary | ICD-10-CM | POA: Diagnosis not present

## 2011-10-01 DIAGNOSIS — I214 Non-ST elevation (NSTEMI) myocardial infarction: Secondary | ICD-10-CM | POA: Diagnosis present

## 2011-10-01 DIAGNOSIS — I517 Cardiomegaly: Secondary | ICD-10-CM

## 2011-10-01 DIAGNOSIS — Z7982 Long term (current) use of aspirin: Secondary | ICD-10-CM | POA: Diagnosis not present

## 2011-10-01 DIAGNOSIS — R739 Hyperglycemia, unspecified: Secondary | ICD-10-CM | POA: Diagnosis present

## 2011-10-01 DIAGNOSIS — M79602 Pain in left arm: Secondary | ICD-10-CM | POA: Diagnosis present

## 2011-10-01 DIAGNOSIS — K5730 Diverticulosis of large intestine without perforation or abscess without bleeding: Secondary | ICD-10-CM | POA: Diagnosis present

## 2011-10-01 DIAGNOSIS — IMO0001 Reserved for inherently not codable concepts without codable children: Secondary | ICD-10-CM | POA: Diagnosis present

## 2011-10-01 DIAGNOSIS — I4891 Unspecified atrial fibrillation: Secondary | ICD-10-CM | POA: Diagnosis not present

## 2011-10-01 DIAGNOSIS — Z09 Encounter for follow-up examination after completed treatment for conditions other than malignant neoplasm: Secondary | ICD-10-CM | POA: Diagnosis not present

## 2011-10-01 DIAGNOSIS — R062 Wheezing: Secondary | ICD-10-CM | POA: Diagnosis not present

## 2011-10-01 HISTORY — DX: Diverticulosis of intestine, part unspecified, without perforation or abscess without bleeding: K57.90

## 2011-10-01 HISTORY — DX: Angiodysplasia of colon without hemorrhage: K55.20

## 2011-10-01 HISTORY — DX: Non-ST elevation (NSTEMI) myocardial infarction: I21.4

## 2011-10-01 LAB — COMPREHENSIVE METABOLIC PANEL
ALT: 13 U/L (ref 0–35)
Calcium: 9.1 mg/dL (ref 8.4–10.5)
GFR calc Af Amer: 71 mL/min — ABNORMAL LOW (ref >90)
Glucose, Bld: 168 mg/dL — ABNORMAL HIGH (ref 70–99)
Sodium: 141 mEq/L (ref 135–145)
Total Protein: 6.7 g/dL (ref 6.0–8.3)

## 2011-10-01 LAB — CBC
Hemoglobin: 7.1 g/dL — ABNORMAL LOW (ref 12.0–15.0)
MCH: 24.3 pg — ABNORMAL LOW (ref 26.0–34.0)
MCH: 26.4 pg (ref 26.0–34.0)
MCHC: 30.3 g/dL (ref 30.0–36.0)
MCHC: 32.8 g/dL (ref 30.0–36.0)
MCV: 80.1 fL (ref 78.0–100.0)
Platelets: 210 10*3/uL (ref 150–400)
Platelets: 180 10*3/uL (ref 150–400)
Platelets: 199 10*3/uL (ref 150–400)
RBC: 3.18 MIL/uL — ABNORMAL LOW (ref 3.87–5.11)
RDW: 14.9 % (ref 11.5–15.5)
RDW: 14.5 % (ref 11.5–15.5)
WBC: 10.5 10*3/uL (ref 4.0–10.5)

## 2011-10-01 LAB — URINALYSIS, ROUTINE W REFLEX MICROSCOPIC
Bilirubin Urine: NEGATIVE
Ketones, ur: NEGATIVE mg/dL
Leukocytes, UA: NEGATIVE
Nitrite: NEGATIVE
Specific Gravity, Urine: 1.015 (ref 1.005–1.030)
Urobilinogen, UA: 0.2 mg/dL (ref 0.0–1.0)

## 2011-10-01 LAB — POCT I-STAT, CHEM 8
Calcium, Ion: 1.13 mmol/L (ref 1.12–1.32)
Chloride: 104 mEq/L (ref 96–112)
HCT: 24 % — ABNORMAL LOW (ref 36.0–46.0)
Hemoglobin: 8.2 g/dL — ABNORMAL LOW (ref 12.0–15.0)
Potassium: 3.7 mEq/L (ref 3.5–5.1)

## 2011-10-01 LAB — MRSA PCR SCREENING: MRSA by PCR: NEGATIVE

## 2011-10-01 LAB — CARDIAC PANEL(CRET KIN+CKTOT+MB+TROPI)
CK, MB: 3.9 ng/mL (ref 0.3–4.0)
Relative Index: 3.3 — ABNORMAL HIGH (ref 0.0–2.5)
Relative Index: 6 — ABNORMAL HIGH (ref 0.0–2.5)
Total CK: 118 U/L (ref 7–177)
Total CK: 162 U/L (ref 7–177)

## 2011-10-01 LAB — PROTIME-INR: INR: 1.33 (ref 0.00–1.49)

## 2011-10-01 LAB — HEPATIC FUNCTION PANEL
ALT: 18 U/L (ref 0–35)
Albumin: 3.6 g/dL (ref 3.5–5.2)
Alkaline Phosphatase: 88 U/L (ref 39–117)
Total Protein: 7.2 g/dL (ref 6.0–8.3)

## 2011-10-01 LAB — URINE MICROSCOPIC-ADD ON

## 2011-10-01 LAB — PREPARE RBC (CROSSMATCH)

## 2011-10-01 MED ORDER — ACETAMINOPHEN 650 MG RE SUPP: 650.0000 mg | Freq: Four times a day (QID) | RECTAL | Status: DC | PRN

## 2011-10-01 MED ORDER — DILTIAZEM HCL ER COATED BEADS 120 MG PO CP24
120.0000 mg | ORAL_CAPSULE | Freq: Every day | ORAL | Status: DC
  Administered 2011-10-01 – 2011-10-05 (×5): 120 mg via ORAL
  Filled 2011-10-01 (×5): qty 1

## 2011-10-01 MED ORDER — DILTIAZEM HCL 100 MG IV SOLR
5.0000 mg/h | INTRAVENOUS | Status: DC
  Filled 2011-10-01: qty 100

## 2011-10-01 MED ORDER — SODIUM CHLORIDE 0.9 % IV SOLN: INTRAVENOUS | Status: DC

## 2011-10-01 MED ORDER — ONDANSETRON HCL 4 MG/2ML IJ SOLN
4.0000 mg | INTRAMUSCULAR | Status: DC | PRN
  Administered 2011-10-01 – 2011-10-04 (×5): 4 mg via INTRAVENOUS
  Filled 2011-10-01 (×5): qty 2

## 2011-10-01 MED ORDER — HYDROMORPHONE HCL PF 1 MG/ML IJ SOLN
INTRAMUSCULAR | Status: AC
  Filled 2011-10-01: qty 1

## 2011-10-01 MED ORDER — LORAZEPAM 0.5 MG PO TABS: 0.2500 mg | ORAL_TABLET | Freq: Two times a day (BID) | ORAL | Status: DC | PRN

## 2011-10-01 MED ORDER — PANTOPRAZOLE SODIUM 40 MG IV SOLR
40.0000 mg | Freq: Two times a day (BID) | INTRAVENOUS | Status: DC
  Administered 2011-10-01 – 2011-10-05 (×10): 40 mg via INTRAVENOUS
  Filled 2011-10-01 (×11): qty 40

## 2011-10-01 MED ORDER — SODIUM CHLORIDE 0.9 % IJ SOLN
INTRAMUSCULAR | Status: AC
  Filled 2011-10-01: qty 9

## 2011-10-01 MED ORDER — LORAZEPAM 0.5 MG PO TABS
0.5000 mg | ORAL_TABLET | Freq: Every day | ORAL | Status: DC
  Administered 2011-10-01: 0.5 mg via ORAL
  Filled 2011-10-01: qty 1

## 2011-10-01 MED ORDER — SODIUM CHLORIDE 0.9 % IJ SOLN
INTRAMUSCULAR | Status: AC
  Filled 2011-10-01: qty 12

## 2011-10-01 MED ORDER — PEG 3350-KCL-NABCB-NACL-NASULF 236 G PO SOLR
4000.0000 mL | Freq: Once | ORAL | Status: AC
  Administered 2011-10-01: 4000 mL via ORAL
  Filled 2011-10-01: qty 4000

## 2011-10-01 MED ORDER — LORAZEPAM 2 MG/ML IJ SOLN
0.5000 mg | Freq: Four times a day (QID) | INTRAMUSCULAR | Status: DC | PRN
  Administered 2011-10-01 – 2011-10-02 (×3): 0.5 mg via INTRAVENOUS
  Filled 2011-10-01 (×3): qty 1

## 2011-10-01 MED ORDER — ACETAMINOPHEN 325 MG PO TABS: 650.0000 mg | ORAL_TABLET | Freq: Four times a day (QID) | ORAL | Status: DC | PRN

## 2011-10-01 MED ORDER — POTASSIUM CHLORIDE IN NACL 20-0.9 MEQ/L-% IV SOLN
INTRAVENOUS | Status: DC
  Administered 2011-10-01 – 2011-10-02 (×2): via INTRAVENOUS
  Administered 2011-10-05: 10 mL/h via INTRAVENOUS

## 2011-10-01 MED ORDER — HYDROMORPHONE HCL PF 1 MG/ML IJ SOLN
0.5000 mg | INTRAMUSCULAR | Status: DC | PRN
  Administered 2011-10-01 – 2011-10-05 (×10): 0.5 mg via INTRAVENOUS
  Filled 2011-10-01 (×9): qty 1

## 2011-10-01 MED ORDER — METOPROLOL TARTRATE 50 MG PO TABS
50.0000 mg | ORAL_TABLET | Freq: Two times a day (BID) | ORAL | Status: DC
  Administered 2011-10-01 – 2011-10-02 (×3): 50 mg via ORAL
  Filled 2011-10-01 (×3): qty 1

## 2011-10-01 MED ORDER — FUROSEMIDE 10 MG/ML IJ SOLN
20.0000 mg | Freq: Once | INTRAMUSCULAR | Status: AC
  Administered 2011-10-01: 20 mg via INTRAVENOUS
  Filled 2011-10-01: qty 2

## 2011-10-01 MED ORDER — LEVALBUTEROL HCL 0.63 MG/3ML IN NEBU: 0.6300 mg | INHALATION_SOLUTION | Freq: Four times a day (QID) | RESPIRATORY_TRACT | Status: DC | PRN

## 2011-10-01 MED ORDER — POTASSIUM CHLORIDE CRYS ER 20 MEQ PO TBCR
20.0000 meq | EXTENDED_RELEASE_TABLET | Freq: Every day | ORAL | Status: AC
  Administered 2011-10-01 – 2011-10-03 (×3): 20 meq via ORAL
  Filled 2011-10-01 (×3): qty 1

## 2011-10-01 NOTE — Progress Notes (Signed)
Pt's rings sent with security to be locked up at patients request.

## 2011-10-01 NOTE — Progress Notes (Signed)
Pt repeatedly educated on fall prevention and asked to call when getting out of bed. Pt refuses bed alarm and will not call when she needs to get from bed to bedside commode. Pt steady on feet but has a fall risk score of 9. Pt has red socks and yellow arm band on.

## 2011-10-01 NOTE — Progress Notes (Signed)
The patient is a 71 year old woman with a history significant for atrial fibrillation, cecal AVMs, and gastric erosions, who was admitted this morning for bright red blood per rectum. The patient was briefly seen. Laboratory studies and vital signs have been reviewed. This is her second hospitalization for GI bleeding in 5 weeks. She is on Xarelto for anticoagulation for atrial fibrillation. Per Dr. Luvenia Starch procedure note from the previous hospitalization in March, it was suggested that if the patient rebled, she may require angiographic embolization versus right colectomy. The admitting physician, Dr. Orvan Falconer has already ordered 2 units of packed red blood cell transfusions and fresh frozen plasma transfusions. We'll decrease the units of fresh frozen plasma from 4 units to only 2 units. Xarelto has been discontinued. CBC will be ordered every 6 hours over the next 24 hours. We'll restart metoprolol and diltiazem because of her elevated blood pressures. GI has been consulted. Continue to monitor her in the ICU.

## 2011-10-01 NOTE — H&P (Signed)
PCP:   No primary provider on file.   ? Cardiologist Dr. Karren Burly. [facility]  Chief Complaint:  Bright red blood per rectum since 8 AM yesterday morning  HPI: Sharon Compton is an 71 y.o. female.   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   Has been reluctant to come to the emergency room since the bleeding started yesterday morning but eventually it began to feel weak and dizzy her husband insisted she come. In the emergency room and hemoglobin was noted to be 7 down from 10 discharge 1 month ago. The hospitalist service was called to assist with management.  Patient also complains of a strange bilateral dorsal arm pain since yesterday, she says is causing her discomfort. She takes gabapentin according to her husband this is to help her at sleep.  Because of patient's discomfort and weakness the history is very difficult she refers to her husband answer questions Rarely uses her inhaler because it gives her a headache.  Has bilateral edema for the past 2 years unclear etiology  Rewiew of Systems:  The patient denies anorexia, fever, weight loss,, vision loss, decreased hearing, hoarseness, chest pain, syncope, dyspnea on exertion, peripheral edema, balance deficits, hemoptysis, abdominal pain,  indigestion/heartburn, hematuria, incontinence, genital sores, muscle weakness, suspicious skin lesions, transient blindness, difficulty walking, depression, unusual weight change, abnormal bleeding, enlarged lymph nodes, angioedema, and breast masses.     Past Medical History  Diagnosis Date  . Hypertension   . Anxiety   . Asthma   . Bronchitis   . Fibromyalgia   . Pneumonia   . Edema   . Peptic ulcer disease   . Hiatal hernia   . GERD (gastroesophageal reflux disease)   . A-fib     cardioversion and TEE at [facility]  . Anxiety disorder 08/21/2011      Past Surgical History  Procedure Date  . Abdominal hysterectomy     partial then complete  . Cholecystectomy   . Abdominal exploration surgery   . Abd tumor removed     states was 10 lbs, benign  . Knee surgery   . Bladder stent   . Esophagogastroduodenoscopy 08/24/2011    Procedure: ESOPHAGOGASTRODUODENOSCOPY (EGD);  Surgeon: Corbin Ade, MD;  [facility];  Service: Endoscopy;  Laterality: N/A;  give phenergan 12.5mg  iv 30 mins prior to procedure  . Colonoscopy 08/25/2011    Procedure: COLONOSCOPY;  Surgeon: Corbin Ade, MD;  Location: [facility];  Service: Endoscopy;  Laterality: N/A;    Medications:  HOME MEDS: Prior to Admission medications   Medication Sig Start Date End Date Taking? Authorizing Provider  albuterol (PROVENTIL HFA;VENTOLIN HFA) 108 (90 BASE) MCG/ACT inhaler Inhale 2 puffs into the lungs 4 (four) times daily as needed. For shortness of breath    Historical Provider, MD  ALPRAZolam Prudy Feeler) 1 MG tablet Take 1 mg by mouth 4 (four) times daily as needed. Anxiety    Historical Provider, MD  aspirin 81 MG chewable tablet Chew 1 tablet (81 mg total) by mouth daily as needed. Chest Pains  Ok to restart on 3/10 08/26/11   Erick Blinks, MD  Cholecalciferol (VITAMIN D) 2000 UNITS tablet Take 2,000 Units by mouth daily.    Historical Provider, MD  diltiazem (CARDIZEM) 120 MG tablet Take 120 mg by mouth daily.    Historical Provider, MD  fish  oil-omega-3 fatty acids 1000 MG capsule Take 1 g by mouth daily.    Historical Provider, MD  furosemide (LASIX) 20 MG tablet Take 1 tablet (20 mg total) by mouth 2 (two) times daily. 08/26/11 08/25/12  Erick Blinks, MD  gabapentin (NEURONTIN) 300 MG capsule Take 300 mg by mouth 3 (three) times daily.      Historical Provider, MD  iron polysaccharides (NIFEREX) 150 MG capsule Take 150 mg by mouth daily.      Historical Provider, MD  metoprolol (LOPRESSOR) 50 MG tablet Take 50 mg by mouth 2 (two) times daily.    Historical  Provider, MD  pantoprazole (PROTONIX) 40 MG tablet Take 1 tablet (40 mg total) by mouth 2 (two) times daily before a meal. 08/26/11 08/25/12  Erick Blinks, MD  Rivaroxaban (XARELTO) 20 MG TABS Take 20 mg by mouth daily. Ok to restart on 09/02/11 08/26/11   Erick Blinks, MD     Allergies:  Allergies  Allergen Reactions  . Sulfa Antibiotics Itching  . Codeine Itching and Palpitations    Social History:   reports that she quit smoking about 12 years ago. Her smoking use included Cigarettes. She has a 15 pack-year smoking history. She has never used smokeless tobacco. She reports that she does not drink alcohol or use illicit drugs.  Family History: Family History  Problem Relation Age of Onset  . Diabetes Mother   . Hypertension Mother   . Cancer Father   . Colon cancer Neg Hx      Physical Exam: Filed Vitals:   10/01/11 0440 10/01/11 0600 10/01/11 0626 10/01/11 0630  BP: 171/83  166/72 145/70  Pulse: 94     Temp:  97.9 F (36.6 C) 98.2 F (36.8 C) 98.2 F (36.8 C)  TempSrc:  Oral Oral Oral  Resp:      Height:      Weight:      SpO2:       Blood pressure 145/70, pulse 94, temperature 98.2 F (36.8 C), temperature source Oral, resp. rate 22, height 5' (1.524 m), weight 84.369 kg (186 lb), SpO2 100.00%.  GEN:  Ill-looking morbidly obese Caucasian lady lying in the stretcher.  cooperative with exam PSYCH:  alert and oriented x4;  HEENT: Mucous membranes pale, and dry and anicteric; PERRLA; EOM intact; thick neck  Breasts:: Not examined CHEST WALL: No tenderness CHEST: Normal respiration, mild bilateral rhonchi  HEART: Irregularly irregular rhythm; distant heart sounds  ABDOMEN: Obese, right anterior flank tenderness; no masses, no organomegaly,  Rectal Exam: Maroon-colored blood per the ER physician EXTREMITIES: 3+ edema bilaterally. Genitalia: not examined PULSES: 2+ and symmetric SKIN: Normal hydration no rash or ulceration CNS: Cranial nerves 2-12 grossly intact no  focal lateralizing neurologic deficit   Labs & Imaging Results for orders placed during the hospital encounter of 10/01/11 (from the past 48 hour(s))  CBC     Status: Abnormal   Collection Time   10/01/11  3:42 AM      Component Value Range Comment   WBC 8.0  4.0 - 10.5 (K/uL)    RBC 2.92 (*) 3.87 - 5.11 (MIL/uL)    Hemoglobin 7.1 (*) 12.0 - 15.0 (g/dL)    HCT 16.1 (*) 09.6 - 46.0 (%)    MCV 80.1  78.0 - 100.0 (fL)    MCH 24.3 (*) 26.0 - 34.0 (pg)    MCHC 30.3  30.0 - 36.0 (g/dL)    RDW 04.5  40.9 - 81.1 (%)    Platelets  210  150 - 400 (K/uL)   TYPE AND SCREEN     Status: Normal (Preliminary result)   Collection Time   10/01/11  3:42 AM      Component Value Range Comment   ABO/RH(D) B POS      Antibody Screen NEG      Sample Expiration 10/04/2011      Unit Number [id number]      Blood Component Type RED CELLS,LR      Unit division 00      Status of Unit ALLOCATED      Transfusion Status OK TO TRANSFUSE      Crossmatch Result Compatible      Unit Number [id number]      Blood Component Type RED CELLS,LR      Unit division 00      Status of Unit ALLOCATED      Transfusion Status OK TO TRANSFUSE      Crossmatch Result Compatible      Unit Number [id number]      Blood Component Type RED CELLS,LR      Unit division 00      Status of Unit ISSUED      Transfusion Status OK TO TRANSFUSE      Crossmatch Result Compatible     COMPREHENSIVE METABOLIC PANEL     Status: Abnormal   Collection Time   10/01/11  3:42 AM      Component Value Range Comment   Sodium 141  135 - 145 (mEq/L)    Potassium 3.7  3.5 - 5.1 (mEq/L)    Chloride 102  96 - 112 (mEq/L)    CO2 32  19 - 32 (mEq/L)    Glucose, Bld 168 (*) 70 - 99 (mg/dL)    BUN 24 (*) 6 - 23 (mg/dL)    Creatinine, Ser 0.86  0.50 - 1.10 (mg/dL)    Calcium 9.1  8.4 - 10.5 (mg/dL)    Total Protein 6.7  6.0 - 8.3 (g/dL)    Albumin 3.3 (*) 3.5 - 5.2 (g/dL)    AST 17  0 - 37 (U/L)    ALT 13  0 - 35 (U/L)    Alkaline Phosphatase 100  39 - 117  (U/L)    Total Bilirubin 0.1 (*) 0.3 - 1.2 (mg/dL)    GFR calc non Af Amer 61 (*) >90 (mL/min)    GFR calc Af Amer 71 (*) >90 (mL/min)   POCT I-STAT, CHEM 8     Status: Abnormal   Collection Time   10/01/11  4:01 AM      Component Value Range Comment   Sodium 143  135 - 145 (mEq/L)    Potassium 3.7  3.5 - 5.1 (mEq/L)    Chloride 104  96 - 112 (mEq/L)    BUN 24 (*) 6 - 23 (mg/dL)    Creatinine, Ser 5.78  0.50 - 1.10 (mg/dL)    Glucose, Bld 469 (*) 70 - 99 (mg/dL)    Calcium, Ion 6.29  1.12 - 1.32 (mmol/L)    TCO2 28  0 - 100 (mmol/L)    Hemoglobin 8.2 (*) 12.0 - 15.0 (g/dL)    HCT 52.8 (*) 41.3 - 46.0 (%)   PREPARE RBC (CROSSMATCH)     Status: Normal   Collection Time   10/01/11  4:45 AM      Component Value Range Comment   Order Confirmation ORDER PROCESSED BY BLOOD BANK     PREPARE FRESH FROZEN PLASMA  Status: Normal (Preliminary result)   Collection Time   10/01/11  4:45 AM      Component Value Range Comment   Unit Number [id number]      Blood Component Type THAWED PLASMA      Unit division 00      Status of Unit ISSUED      Transfusion Status OK TO TRANSFUSE      No results found.    Assessment Present on Admission:  .GI bleed, probably lower pole BUN is elevated  .Acute blood loss anemia .Obesity .Peripheral edema .Anxiety disorder .Asthma .Benign hypertension .Bilateral arm pain  PLAN: We'll transfuse 4 units of FFP to reverse her anticoagulation, then 3 units of packed red cells. Consult gastroenterologist.  She is complaining of pain and we will go ahead and give Dilaudid since her blood pressure is very high, and the hypertension may contribute to her anemia and bleeding.  Keep her n.p.o. and give her anxiolytics parenterally.  Other plans as per orders.  Critical care time: 60 minutes.   Diannah Rindfleisch 10/01/2011, 6:43 AM

## 2011-10-01 NOTE — Consult Note (Signed)
Reason for Consult:GI bleed Referring Physician: Jaslyn Compton is an 71 y.o. female.  HPI: Sharon Compton is a 71 yr old female admitted this morning with c/o bright red rectal bleeding. She tells me she started having bright red rectal bleeding yesterday.  She says it was a large amount of blood.  She says her stools have been black from the iron she is taking. She has a hx of rectal bleeding and underwent a colonoscopy and EGD last month.  On colonoscopy she was found the have multiple cecal AM's. It was suspected this was the cause of her bleeding. She is status post APC thermal sealing by Dr. Jena Gauss.  She also underwent an EGD which revealed gastric erosions.  She is on Xarelto for atrial fib. She has presently received one unit of PRBCs and one unit of Plasma.  She will receive another unit of PRBCs.  Noted on this admission her hemoglobin was 7.1.  PT/INR pending.  She has not had a BM since admission.  Xarelto on hold at this time. 08/25/2011 Colonoscopy Dr. Jena Gauss IMPRESSION: Colonic diverticulosis. Multiple cecal AVMs-suspect  lesion as far as cause of bleeding is concerned - status post APC  thermal sealing  RECOMMENDATIONS: No anticoagulation or antiplatelet therapy x1  week. Continue proton pump inhibitor therapy. Follow up H. pylori  serologies. If this lady were to rebleed in the future from cecal  AVMs, consider angiographic embolization versus right  hemicolectomy. Continue PPI.                           Past Medical History  Diagnosis Date  . Hypertension   . Anxiety   . Asthma   . Bronchitis   . Fibromyalgia   . Pneumonia   . Edema   . Peptic ulcer disease   . Hiatal hernia   . GERD (gastroesophageal reflux disease)   . A-fib     cardioversion and TEE at [facility]  . Anxiety disorder 08/21/2011  . 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 Diverticulosis 10/01/2011    Past Surgical History  Procedure Date  . Abdominal hysterectomy     partial then  complete  . Cholecystectomy   . Abdominal exploration surgery   . Abd tumor removed     states was 10 lbs, benign  . Knee surgery   . Bladder stent   . Esophagogastroduodenoscopy 08/24/2011    Procedure: ESOPHAGOGASTRODUODENOSCOPY (EGD);  Surgeon: Corbin Ade, MD;  [facility];  Service: Endoscopy;  Laterality: N/A;  give phenergan 12.5mg  iv 30 mins prior to procedure  . Colonoscopy 08/25/2011    Procedure: COLONOSCOPY;  Surgeon: Corbin Ade, MD;  [facility];  Service: Endoscopy;  Laterality: N/A;    Family History  Problem Relation Age of Onset  . Diabetes Mother   . Hypertension Mother   . Cancer Father   . Colon cancer Neg Hx     Social History:  reports that she quit smoking about 12 years ago. Her smoking use included Cigarettes. She has a 15 pack-year smoking history. She has never used smokeless tobacco. She reports that she does not drink alcohol or use illicit drugs.  Allergies:  Allergies  Allergen Reactions  . Sulfa Antibiotics Itching  . Codeine Itching and Palpitations    Medications: I have reviewed the patient's current medications.  Results for orders placed during the hospital encounter of 10/01/11 (from the past 48 hour(s))  CBC     Status: Abnormal   Collection Time   10/01/11  3:42 AM      Component Value Range Comment   WBC 8.0  4.0 - 10.5 (K/uL)    RBC 2.92 (*) 3.87 - 5.11 (MIL/uL)    Hemoglobin 7.1 (*) 12.0 - 15.0 (g/dL)    HCT 08.6 (*) 57.8 - 46.0 (%)    MCV 80.1  78.0 - 100.0 (fL)    MCH 24.3 (*) 26.0 - 34.0 (pg)    MCHC 30.3  30.0 - 36.0 (g/dL)    RDW 46.9  62.9 - 52.8 (%)    Platelets 210  150 - 400 (K/uL)   TYPE AND SCREEN     Status: Normal (Preliminary result)   Collection Time   10/01/11  3:42 AM      Component Value Range Comment   ABO/RH(D) B POS      Antibody Screen NEG      Sample Expiration 10/04/2011      Unit Number [id number]      Blood Component Type RED CELLS,LR      Unit division 00      Status of  Unit ALLOCATED      Transfusion Status OK TO TRANSFUSE      Crossmatch Result Compatible      Unit Number [id number]      Blood Component Type RED CELLS,LR      Unit division 00      Status of Unit ALLOCATED      Transfusion Status OK TO TRANSFUSE      Crossmatch Result Compatible      Unit Number [id number]      Blood Component Type RED CELLS,LR      Unit division 00      Status of Unit ISSUED      Transfusion Status OK TO TRANSFUSE      Crossmatch Result Compatible     COMPREHENSIVE METABOLIC PANEL     Status: Abnormal   Collection Time   10/01/11  3:42 AM      Component Value Range Comment   Sodium 141  135 - 145 (mEq/L)    Potassium 3.7  3.5 - 5.1 (mEq/L)    Chloride 102  96 - 112 (mEq/L)    CO2 32  19 - 32 (mEq/L)    Glucose, Bld 168 (*) 70 - 99 (mg/dL)    BUN 24 (*) 6 - 23 (mg/dL)    Creatinine, Ser 6.38  0.50 - 1.10 (mg/dL)    Calcium 9.1  8.4 - 10.5 (mg/dL)    Total Protein 6.7  6.0 - 8.3 (g/dL)    Albumin 3.3 (*) 3.5 - 5.2 (g/dL)    AST 17  0 - 37 (U/L)    ALT 13  0 - 35 (U/L)    Alkaline Phosphatase 100  39 - 117 (U/L)    Total Bilirubin 0.1 (*) 0.3 - 1.2 (mg/dL)    GFR calc non Af Amer 61 (*) >90 (mL/min)    GFR calc Af Amer 71 (*) >90 (mL/min)   POCT I-STAT, CHEM 8     Status: Abnormal   Collection Time   10/01/11  4:01 AM      Component Value Range Comment   Sodium 143  135 - 145 (mEq/L)    Potassium 3.7  3.5 - 5.1 (mEq/L)    Chloride 104  96 - 112 (mEq/L)    BUN 24 (*) 6 - 23 (mg/dL)  Creatinine, Ser 0.90  0.50 - 1.10 (mg/dL)    Glucose, Bld 161 (*) 70 - 99 (mg/dL)    Calcium, Ion 0.96  1.12 - 1.32 (mmol/L)    TCO2 28  0 - 100 (mmol/L)    Hemoglobin 8.2 (*) 12.0 - 15.0 (g/dL)    HCT 04.5 (*) 40.9 - 46.0 (%)   PREPARE RBC (CROSSMATCH)     Status: Normal   Collection Time   10/01/11  4:45 AM      Component Value Range Comment   Order Confirmation ORDER PROCESSED BY BLOOD BANK     PREPARE FRESH FROZEN PLASMA     Status: Normal (Preliminary result)    Collection Time   10/01/11  4:45 AM      Component Value Range Comment   Unit Number [id number]      Blood Component Type THAWED PLASMA      Unit division 00      Status of Unit ISSUED      Transfusion Status OK TO TRANSFUSE      Unit Number [id number]      Blood Component Type THAWED PLASMA      Unit division 00      Status of Unit ISSUED      Transfusion Status OK TO TRANSFUSE       No results found.  ROS Blood pressure 188/90, pulse 93, temperature 97.8 F (36.6 C), temperature source Oral, resp. rate 22, height 5' (1.524 m), weight 195 lb 8.8 oz (88.7 kg), SpO2 100.00%. Physical ExamAlert and oriented. Skin warm and dry. Oral mucosa is moist.   . Sclera anicteric, conjunctivae is pink. Thyroid not enlarged. No cervical lymphadenopathy. Lungs clear. Heart regular rate and rhythm.  Abdomen is soft. Bowel sounds are positive. No hepatomegaly. No abdominal masses felt. Mild tenderness to lower abdomen (she had to void)  3-4 + edema to lower extremities. Patient is alert and oriented.   Assessment/Plan: Probably lower GI bleed probably from cecal AVMs.   I will discuss this case with Dr. Siri Cole 10/01/2011, 8:54 AM     GI attending note; Patient interviewed and examined. Patient is a 71 year old Caucasian female who presents with acute GI bleed and anemia; hemoglobin of 7.1 and Patient is receiving second unit of PRBC. Patient has atrial fibrillation and has been on Xarelto. She denies using NSAIDs. She was treated at this facility last month for GI bleed and underwent EGD and colonoscopy by Dr. Jena Gauss. Source of GI blood loss was felt to be cecal AV malformations which were ablated with argon plasma coagulator. Suspect she has recurrent lower GI bleed secondary to cecal AV malformations. She needs to be reexamined in these lesions treated as appropriate. Patient's troponin level is mildly elevated. She denies chest pain or shortness of breath noted axillary pain last  night. Unless Dr. Sherrie Mustache feels troponin level is significant will plan colonoscopy in a.m. she'll be prepped later this afternoon. Patient will need propofol as she could not be felt sedated with conscious sedation last time.

## 2011-10-01 NOTE — Progress Notes (Signed)
UR Chart Review Completed  

## 2011-10-01 NOTE — Progress Notes (Signed)
MD notified of critical CKMB and elevating trop. MD returned page and discussed plan.

## 2011-10-01 NOTE — ED Provider Notes (Signed)
History     CSN: [id number]  Arrival date & time 10/01/11  4098   First MD Initiated Contact with Patient 10/01/11 [phone]      Chief Complaint  Patient presents with  . Rectal Bleeding    (Consider location/radiation/quality/duration/timing/severity/associated sxs/prior treatment) HPI Bright red blood per x3 episodes tonight. Not associated with bowel movements. Patient states she felt gurgling urge to have a bowel movement with bright red blood. Patient is on xarelto and has history of GI bleeds.  has known peptic ulcer disease. History of blood transfusions in the past. No weakness, fatigue or shortness of breath. No bruising. No nausea or vomiting. No abdominal pain. No rectal pain. Moderate in severity. Past Medical History  Diagnosis Date  . Hypertension   . Anxiety   . Asthma   . Bronchitis   . Fibromyalgia   . Pneumonia   . Edema   . Peptic ulcer disease   . Hiatal hernia   . GERD (gastroesophageal reflux disease)   . A-fib     cardioversion and TEE at [facility]  . Anxiety disorder 08/21/2011    Past Surgical History  Procedure Date  . Abdominal hysterectomy     partial then complete  . Cholecystectomy   . Abdominal exploration surgery   . Abd tumor removed     states was 10 lbs, benign  . Knee surgery   . Bladder stent   . Esophagogastroduodenoscopy 08/24/2011    Procedure: ESOPHAGOGASTRODUODENOSCOPY (EGD);  Surgeon: Corbin Ade, MD;  Location: [facility];  Service: Endoscopy;  Laterality: N/A;  give phenergan 12.5mg  iv 30 mins prior to procedure  . Colonoscopy 08/25/2011    Procedure: COLONOSCOPY;  Surgeon: Corbin Ade, MD;  Location: [facility];  Service: Endoscopy;  Laterality: N/A;    Family History  Problem Relation Age of Onset  . Diabetes Mother   . Hypertension Mother   . Cancer Father   . Colon cancer Neg Hx     History  Substance Use Topics  . Smoking status: Former Smoker -- 1.0 packs/day for 15 years    Types: Cigarettes    Quit  date: 06/24/1999  . Smokeless tobacco: Never Used  . Alcohol Use: No    OB History    Grav Para Term Preterm Abortions TAB SAB Ect Mult Living   3 3 3       3       Review of Systems  Constitutional: Negative for fever and chills.  HENT: Negative for neck pain and neck stiffness.   Eyes: Negative for pain.  Respiratory: Negative for shortness of breath.   Cardiovascular: Negative for chest pain.  Gastrointestinal: Positive for anal bleeding. Negative for nausea, vomiting, abdominal pain and rectal pain.  Genitourinary: Negative for dysuria.  Musculoskeletal: Negative for back pain.  Skin: Negative for rash.  Neurological: Negative for weakness and headaches.  All other systems reviewed and are negative.    Allergies  Sulfa antibiotics and Codeine  Home Medications   Current Outpatient Rx  Name Route Sig Dispense Refill  . ALBUTEROL SULFATE HFA 108 (90 BASE) MCG/ACT IN AERS Inhalation Inhale 2 puffs into the lungs 4 (four) times daily as needed. For shortness of breath    . ALPRAZOLAM 1 MG PO TABS Oral Take 1 mg by mouth 4 (four) times daily as needed. Anxiety    . ASPIRIN 81 MG PO CHEW Oral Chew 1 tablet (81 mg total) by mouth daily as needed. Chest Pains  [facility]  to restart on 3/10    . VITAMIN D 2000 UNITS PO TABS Oral Take 2,000 Units by mouth daily.    [name] DILTIAZEM HCL 120 MG PO TABS Oral Take 120 mg by mouth daily.    . OMEGA-3 FATTY ACIDS 1000 MG PO CAPS Oral Take 1 g by mouth daily.    . FUROSEMIDE 20 MG PO TABS Oral Take 1 tablet (20 mg total) by mouth 2 (two) times daily. 30 tablet 1  . GABAPENTIN 300 MG PO CAPS Oral Take 300 mg by mouth 3 (three) times daily.      [name] POLYSACCHARIDE IRON COMPLEX 150 MG PO CAPS Oral Take 150 mg by mouth daily.      [name] METOPROLOL TARTRATE 50 MG PO TABS Oral Take 50 mg by mouth 2 (two) times daily.    [name] PANTOPRAZOLE SODIUM 40 MG PO TBEC Oral Take 1 tablet (40 mg total) by mouth 2 (two) times daily before a meal. 60 tablet 1  . RIVAROXABAN 20  MG PO TABS Oral Take 20 mg by mouth daily. Ok to restart on 09/02/11 30 tablet     BP 171/83  Pulse 94  Temp(Src) 98 F (36.7 C) (Oral)  Resp 22  Ht 5' (1.524 m)  Wt 186 lb (84.369 kg)  BMI 36.33 kg/m2  SpO2 100%  Physical Exam  Constitutional: She is oriented to person, place, and time. She appears well-developed and well-nourished.  HENT:  Head: Normocephalic and atraumatic.  Eyes: Conjunctivae and EOM are normal. Pupils are equal, round, and reactive to light.  Neck: Trachea normal. Neck supple. No thyromegaly present.  Cardiovascular: Normal rate, regular rhythm, S1 normal, S2 normal and normal pulses.     No systolic murmur is present   No diastolic murmur is present  Pulses:      Radial pulses are 2+ on the right side, and 2+ on the left side.  Pulmonary/Chest: Effort normal and breath sounds normal. She has no wheezes. She has no rhonchi. She has no rales. She exhibits no tenderness.  Abdominal: Soft. Normal appearance and bowel sounds are normal. There is no tenderness. There is no CVA tenderness and negative Murphy's sign.  Genitourinary:       Rectal exam: bright red blood fingertip, nontender, no masses  Musculoskeletal:       BLE:s Calves nontender, no cords or erythema, negative Homans sign  Neurological: She is alert and oriented to person, place, and time. She has normal strength. No cranial nerve deficit or sensory deficit. GCS eye subscore is 4. GCS verbal subscore is 5. GCS motor subscore is 6.  Skin: Skin is warm and dry. No rash noted. She is not diaphoretic.  Psychiatric: Her speech is normal.       Cooperative and appropriate    ED Course  Procedures (including critical care time)  Labs Reviewed  CBC - Abnormal; Notable for the following:    RBC 2.92 (*)    Hemoglobin 7.1 (*)    HCT 23.4 (*)    MCH 24.3 (*)    All other components within normal limits  COMPREHENSIVE METABOLIC PANEL - Abnormal; Notable for the following:    Glucose, Bld 168 (*)     BUN 24 (*)    Albumin 3.3 (*)    Total Bilirubin 0.1 (*)    GFR calc non Af Amer 61 (*)    GFR calc Af Amer 71 (*)    All other components within normal limits  POCT I-STAT, CHEM  8 - Abnormal; Notable for the following:    BUN 24 (*)    Glucose, Bld 163 (*)    Hemoglobin 8.2 (*)    HCT 24.0 (*)    All other components within normal limits  TYPE AND SCREEN  PREPARE RBC (CROSSMATCH)  PREPARE FRESH FROZEN PLASMA   5:09 AM case discussed with Dr. Orvan Falconer who will admit he recommends in addition to blood transfusion to initiate FFP. Discussed with GI on call Dr. Gerilyn Nestle will follow   MDM   GI bleeding on Xarelto. Plan transfusion and admit to telemetry.         Sunnie Nielsen, MD 10/01/11 [phone]

## 2011-10-01 NOTE — Progress Notes (Signed)
*  PRELIMINARY RESULTS* Echocardiogram 2D Echocardiogram has been performed.  Conrad Lewis[place] 10/01/2011, 2:59 PM

## 2011-10-01 NOTE — ED Notes (Signed)
Pt with gi bleeding.  Pt was inpatient with same approx 3 weeks ago

## 2011-10-02 ENCOUNTER — Encounter (HOSPITAL_COMMUNITY)
Admission: EM | Disposition: A | Discharge status: Home / Self Care | Payer: Self-pay | Source: Home / Self Care | Attending: Internal Medicine

## 2011-10-02 ENCOUNTER — Encounter (HOSPITAL_COMMUNITY): Payer: Self-pay | Admitting: Internal Medicine

## 2011-10-02 DIAGNOSIS — I219 Acute myocardial infarction, unspecified: Secondary | ICD-10-CM

## 2011-10-02 DIAGNOSIS — I10 Essential (primary) hypertension: Secondary | ICD-10-CM

## 2011-10-02 DIAGNOSIS — K922 Gastrointestinal hemorrhage, unspecified: Secondary | ICD-10-CM

## 2011-10-02 DIAGNOSIS — I4891 Unspecified atrial fibrillation: Secondary | ICD-10-CM

## 2011-10-02 DIAGNOSIS — I214 Non-ST elevation (NSTEMI) myocardial infarction: Secondary | ICD-10-CM

## 2011-10-02 HISTORY — DX: Non-ST elevation (NSTEMI) myocardial infarction: I21.4

## 2011-10-02 LAB — CARDIAC PANEL(CRET KIN+CKTOT+MB+TROPI)
Relative Index: 4.7 — ABNORMAL HIGH (ref 0.0–2.5)
Total CK: 213 U/L — ABNORMAL HIGH (ref 7–177)
Troponin I: 1.86 ng/mL (ref <0.30)

## 2011-10-02 LAB — COMPREHENSIVE METABOLIC PANEL
Albumin: 3.4 g/dL — ABNORMAL LOW (ref 3.5–5.2)
BUN: 11 mg/dL (ref 6–23)
Chloride: 99 mEq/L (ref 96–112)
Creatinine, Ser: 0.83 mg/dL (ref 0.50–1.10)
GFR calc Af Amer: 81 mL/min — ABNORMAL LOW (ref >90)
GFR calc non Af Amer: 70 mL/min — ABNORMAL LOW (ref >90)
Glucose, Bld: 137 mg/dL — ABNORMAL HIGH (ref 70–99)
Total Bilirubin: 0.8 mg/dL (ref 0.3–1.2)

## 2011-10-02 LAB — PREPARE FRESH FROZEN PLASMA: Unit division: 0

## 2011-10-02 LAB — PREPARE RBC (CROSSMATCH)

## 2011-10-02 LAB — LIPID PANEL
Cholesterol: 200 mg/dL (ref 0–200)
Total CHOL/HDL Ratio: 4.3 RATIO

## 2011-10-02 LAB — TSH: TSH: 2.859 u[IU]/mL (ref 0.350–4.500)

## 2011-10-02 LAB — CBC
HCT: 31.1 % — ABNORMAL LOW (ref 36.0–46.0)
Hemoglobin: 10.3 g/dL — ABNORMAL LOW (ref 12.0–15.0)
MCHC: 33.1 g/dL (ref 30.0–36.0)
MCV: 81.8 fL (ref 78.0–100.0)
RDW: 14.7 % (ref 11.5–15.5)
WBC: 7.3 10*3/uL (ref 4.0–10.5)

## 2011-10-02 LAB — PRO B NATRIURETIC PEPTIDE: Pro B Natriuretic peptide (BNP): 2360 pg/mL — ABNORMAL HIGH (ref 0–125)

## 2011-10-02 SURGERY — COLONOSCOPY
Anesthesia: Monitor Anesthesia Care

## 2011-10-02 MED ORDER — NITROGLYCERIN 0.4 MG SL SUBL
0.4000 mg | SUBLINGUAL_TABLET | SUBLINGUAL | Status: DC | PRN
  Administered 2011-10-02: 0.4 mg via SUBLINGUAL
  Filled 2011-10-02: qty 25

## 2011-10-02 MED ORDER — NITROGLYCERIN 2 % TD OINT
1.0000 [in_us] | TOPICAL_OINTMENT | Freq: Four times a day (QID) | TRANSDERMAL | Status: DC
  Administered 2011-10-02 (×3): 1 [in_us] via TOPICAL
  Filled 2011-10-02 (×3): qty 1

## 2011-10-02 MED ORDER — SODIUM CHLORIDE 0.9 % IJ SOLN
INTRAMUSCULAR | Status: AC
Start: 2011-10-02 — End: 2011-10-02
  Administered 2011-10-02: 10 mL
  Filled 2011-10-02: qty 3

## 2011-10-02 MED ORDER — ALPRAZOLAM 0.5 MG PO TABS
1.0000 mg | ORAL_TABLET | Freq: Four times a day (QID) | ORAL | Status: DC | PRN
  Administered 2011-10-02 – 2011-10-06 (×9): 1 mg via ORAL
  Filled 2011-10-02 (×9): qty 2

## 2011-10-02 MED ORDER — FUROSEMIDE 10 MG/ML IJ SOLN
20.0000 mg | Freq: Once | INTRAMUSCULAR | Status: AC
  Administered 2011-10-02: 20 mg via INTRAVENOUS
  Filled 2011-10-02: qty 2

## 2011-10-02 MED ORDER — METOPROLOL TARTRATE 50 MG PO TABS
75.0000 mg | ORAL_TABLET | Freq: Two times a day (BID) | ORAL | Status: DC
  Administered 2011-10-02 – 2011-10-04 (×4): 75 mg via ORAL
  Filled 2011-10-02 (×4): qty 1

## 2011-10-02 NOTE — Progress Notes (Signed)
CRITICAL VALUE ALERT  Critical value received: ckmb=10 & troponin 1.86  Date of notification: 10/02/11  Time of notification: 0025   Critical value read back:yes  Nurse who received alert:BOB Lashawne Dura  MD notified (1st page): RHEMAN  Time of first page: 0025  MD notified (2nd page):CAMPBELL  Time of second page:0030  Responding MD:  [id number]  Time MD responded: [phone]

## 2011-10-02 NOTE — Progress Notes (Addendum)
Subjective: Since I last evaluated the patient  Remains alert. She says she has not seen any further bleeding. C/o diffuse abdominal tenderness on palpation. Troponin elevated. Hemoglobin 8.2. She has received one unit of PRBCs today and will receive one more.  No chest pain.  Objective: Vital signs in last 24 hours: Temp:  [97.2 F (36.2 C)-98.6 F (37 C)] 97.4 F (36.3 C) (04/09 0645) Pulse Rate:  [59-107] 66  (04/09 0645) Resp:  [10-22] 11  (04/09 0645) BP: (100-186)/(55-119) 118/64 mmHg (04/09 0645) SpO2:  [92 %-100 %] 100 % (04/09 0600) Weight:  [201 lb 8 oz (91.4 kg)] 201 lb 8 oz (91.4 kg) (04/09 0454) Last BM Date: 10/01/11  Intake/Output from previous day: 04/08 0701 - 04/09 0700 In: 1269.2 [Blood:1269.2] Out: 1902 [Urine:1900; Stool:2] Intake/Output this shift:    General appearance: appears stated ageAlert and oriented. Skin warm and dry. Oral mucosa is moist.   . Sclera anicteric, conjunctivae is pink. Lungs clear. Heart regular rate and rhythm.   NS on monitor. Abdomen is soft. Bowel sounds are positive. No hepatomegaly. No abdominal masses felt.  Diffuse tenderness.  3+ edema to lower extremities.  Lab Results:  Basename 10/01/11 2316 10/01/11 1522 10/01/11 0401 10/01/11 0342  WBC 10.5 9.9 -- 8.0  HGB 8.2* 8.1* 8.2* --  HCT 25.5* 24.7* 24.0* --  PLT 199 180 -- 210   BMET  Basename 10/01/11 0401 10/01/11 0342  NA 143 141  K 3.7 3.7  CL 104 102  CO2 -- 32  GLUCOSE 163* 168*  BUN 24* 24*  CREATININE 0.90 0.93  CALCIUM -- 9.1   LFT  Basename 10/01/11 0910  PROT 7.2  ALBUMIN 3.6  AST 36  ALT 18  ALKPHOS 88  BILITOT 0.4  BILIDIR <0.1  IBILI NOT CALCULATED   PT/INR  Basename 10/01/11 0910  LABPROT 16.7*  INR 1.33   Hepatitis Panel No results found for this basename: HEPBSAG,HCVAB,HEPAIGM,HEPBIGM in the last 72 hours C-Diff No results found for this basename: CDIFFTOX:3 in the last 72 hours Fecal Lactopherrin No results found for this  basename: FECLLACTOFRN in the last 72 hours  Studies/Results: No results found.  Medications: I have reviewed the patient's current medications.  Assessment/Plan: Probable lower GI bleed from cecal AVM.  Will continue to monitor. Needs to be cleared by cardiology before attempting colonoscopy/APC therapy.   LOS: 1 day   SETZER,TERRI W 10/02/2011, 8:30 AM  GI attending note; Patient not passing blood per rectum anymore. She was able to finish GoLYTELY prep last evening. Posttransfusion H&H is pending. There's been significant bump in troponin levels therefore a colonoscopy postponed. Dr. Jena Gauss will be seeing patient on October 03, 2011 for GI issues.

## 2011-10-02 NOTE — Progress Notes (Signed)
Subjective: Events noted yesterday for continued bloody bowel movements. Less so overnight. Events also noted for an increasing troponin I am the patient's complaint of transient left-sided chest pain. She was started on nitroglycerin paste. She was also transfused another 2 units of packed red blood cells overnight with Lasix given between the units. The followup CBC from this morning is pending. Currently, the patient has no complaints of chest pain. She has no shortness of breath. She did have 1 episode of vomiting this morning but no complaints of abdominal pain currently. There is no coffee grounds emesis or bright red blood in her emesis.  Objective: Vital signs in last 24 hours: Filed Vitals:   10/02/11 0600 10/02/11 0615 10/02/11 0630 10/02/11 0645  BP: 118/64   118/64  Pulse: 65 62 66 66  Temp:    97.4 F (36.3 C)  TempSrc:    Oral  Resp: 10 11 11 11   Height:      Weight:      SpO2: 100%       Intake/Output Summary (Last 24 hours) at 10/02/11 0809 Last data filed at 10/02/11 0515  Gross per 24 hour  Intake 929.17 ml  Output   1902 ml  Net -972.83 ml    Weight change: 4.331 kg (9 lb 8.8 oz)  Physical exam: Gen.: Pleasant obese 71 year old Caucasian woman sitting up in, in no acute distress. Lungs: Clear to auscultation bilaterally. Heart: S1, S2, with an ectopic beat versus irregular irregular. Abdomen: Morbidly obese, positive bowel sounds, soft, nontender, nondistended. Extremities: 1+ nonpitting edema.  Lab Results: Basic Metabolic Panel:  Basename 10/01/11 0401 10/01/11 0342  NA 143 141  K 3.7 3.7  CL 104 102  CO2 -- 32  GLUCOSE 163* 168*  BUN 24* 24*  CREATININE 0.90 0.93  CALCIUM -- 9.1  MG -- --  PHOS -- --   Liver Function Tests:  Basename 10/01/11 0910 10/01/11 0342  AST 36 17  ALT 18 13  ALKPHOS 88 100  BILITOT 0.4 0.1*  PROT 7.2 6.7  ALBUMIN 3.6 3.3*   No results found for this basename: LIPASE:2,AMYLASE:2 in the last 72 hours No results  found for this basename: AMMONIA:2 in the last 72 hours CBC:  Basename 10/01/11 2316 10/01/11 1522  WBC 10.5 9.9  NEUTROABS -- --  HGB 8.2* 8.1*  HCT 25.5* 24.7*  MCV 80.2 80.5  PLT 199 180   Cardiac Enzymes:  Basename 10/01/11 2316 10/01/11 1521 10/01/11 0910  CKTOTAL 213* 162 118  CKMB 10.0* 9.7* 3.9  CKMBINDEX -- -- --  TROPONINI 1.86* 1.31* 0.35*   BNP: No results found for this basename: PROBNP:3 in the last 72 hours D-Dimer: No results found for this basename: DDIMER:2 in the last 72 hours CBG: No results found for this basename: GLUCAP:6 in the last 72 hours Hemoglobin A1C: No results found for this basename: HGBA1C in the last 72 hours Fasting Lipid Panel: No results found for this basename: CHOL,HDL,LDLCALC,TRIG,CHOLHDL,LDLDIRECT in the last 72 hours Thyroid Function Tests: No results found for this basename: TSH,T4TOTAL,FREET4,T3FREE,THYROIDAB in the last 72 hours Anemia Panel: No results found for this basename: VITAMINB12,FOLATE,FERRITIN,TIBC,IRON,RETICCTPCT in the last 72 hours Coagulation:  Basename 10/01/11 0910  LABPROT 16.7*  INR 1.33   Urine Drug Screen: Drugs of Abuse  No results found for this basename: labopia, cocainscrnur, labbenz, amphetmu, thcu, labbarb    Alcohol Level: No results found for this basename: ETH:2 in the last 72 hours Urinalysis:  Basename 10/01/11 1425  COLORURINE  YELLOW  LABSPEC 1.015  PHURINE 6.5  GLUCOSEU 100*  HGBUR TRACE*  BILIRUBINUR NEGATIVE  KETONESUR NEGATIVE  PROTEINUR NEGATIVE  UROBILINOGEN 0.2  NITRITE NEGATIVE  LEUKOCYTESUR NEGATIVE   Misc. Labs:  EKG October 02, 2011: Normal sinus rhythm with sinus arrhythmia, heart rate 68 beats per minute, Q waves in the inferior leads. EKG January 31 2012: Normal sinus rhythm, heart rate 82 beats per minute, Q waves in the inferior leads.   Micro: Recent Results (from the past 240 hour(s))  MRSA PCR SCREENING     Status: Normal   Collection Time   10/01/11  8:43  AM      Component Value Range Status Comment   MRSA by PCR NEGATIVE  NEGATIVE  Final     Studies/Results: No results found.  Medications: I have reviewed the patient's current medications.  Assessment: Principal Problem:  *GI bleed Active Problems:  Acute blood loss anemia  History of atrial fibrillation  Obesity  Benign hypertension  Anxiety disorder  Peripheral edema  Asthma  Bilateral arm pain  Atrial fibrillation  Azotemia  AVM (arteriovenous malformation) of colon  Diverticulosis  MI, acute, non ST segment elevation    1. Recurrent GI bleeding in the setting of Xarelto treatment. Her EGD in March revealed pyloric channel erosions and her colonoscopy revealed colonic diverticulosis and multiple cecal AVMs, status post APC thermal sealing by Dr. Jena Gauss. Per Dr. Luvenia Starch comments  then, if she rebled in the future from cecal AVMs, consider angiographic embolization versus right hemicolectomy. Dr. Karilyn Cota evaluated the patient yesterday and planned a colonoscopy this morning, however, the patient's cardiac enzymes have steadily increased and therefore the procedure was postponed. She is on Protonix IV.  2. Elevated cardiac enzymes, consistent with non-ST elevation myocardial infarction. Heparin/Lovenox and aspirin are currently contraindicated in the setting of an active GI bleed. She is already on a beta blocker. Nitroglycerin paste has been added. Cardiology has been consulted. She is currently hemodynamically stable and chest pain-free at this point. A 2-D echocardiogram has been ordered and was done but has not been read yet.  3. History of hypertension in the setting of hypovolemia. She is treated with metoprolol and possibly diltiazem and lisinopril chronically. She is currently on metoprolol only.   4. Chronic atrial fibrillation. She is intermittently in normal sinus rhythm. Apparently she had an ablation procedure indeed in a couple of months ago. She is on Xarelto  chronically. This is currently being held.  5. Acute blood loss anemia. She is status post a total of 4 units of packed red blood cell transfusions. Followup CBC is pending.  6. Chronic peripheral/lower extremity edema. She was given Lasix between transfusions yesterday. 2-D echocardiogram results are pending.  Plan:  1. Colonoscopy has been postponed. 2. Continue supportive treatment, gentle IV fluids, and monitoring her CBC. 3. Cardiology consult. We'll check the results of the 2-D echocardiogram pending. 4. We'll place parameters on metoprolol as her blood pressure is on the low-normal side. 5. We'll order fasting lipid panel.    Critical care time: 45 minutes.   LOS: 1 day   Lovette Merta 10/02/2011, 8:09 AM

## 2011-10-02 NOTE — Consult Note (Signed)
CARDIOLOGY CONSULT NOTE  Patient ID: Sharon Compton MRN: [id number] DOB/AGE: 07-22-1940 71 y.o.  Admit date: 10/01/2011 Referring Physician: Fischer Primary Physician:[facility]FP Primary Cardiologist: Juanda Crumble Reason for Consultation:   Principal Problem:  *GI bleed Active Problems:  Acute blood loss anemia  History of atrial fibrillation  Obesity  Benign hypertension  Anxiety disorder  Peripheral edema  Asthma  Bilateral arm pain  Atrial fibrillation  Azotemia  AVM (arteriovenous malformation) of colon  Diverticulosis  MI, acute, non ST segment elevation  HPI: Sharon Compton is a 71 year old patient who is normally followed in [place] by Dr. Theresa Duty, cardiologist.  [facility]. She was admitted on 10/01/2011 with a GI bleed with a hemoglobin found to be 7.1. She has a history of GI bleed in the past and had been seen by Dr. Kendell Bane , where she underwent a colonoscopy and EGD in March. This revealed gastric erosions and H. pylori serologies were sent. She also had colonoscopy, which revealed cecal AVMs. She was seen at [facility] after being released from and [facility] hospital in March with palpitations and dizziness. She was seen by Dr. Theresa Duty, cardiologist through [facility]. She was found to be in A. fib with RVR. She states she had TEE completed and also had a cardioversion completed. She says that her heart went back to normal and she follows with him in his office in [place] where she was placed on Xarelto. She states that she had been having black, tarry stools since that time, but believed it to be related to iron replacement. She states that she began to feel weak and dizzy and began to pass blood propping her husband to bring her to [facility]. Since admission . She has experienced bilateral arm pain, mild shortness of breath, and some left-sided chest discomfort. She has been given blood transfusions (2 units). Cardiac enzymes were cycled  and she was found to have positive results, initially 0.35, increasing to 1.31 and 1.86, respectively. CK-MB 9.7, and 10.0, respectively. Xarleto, obviously, has been on hold. EKG does not show any ischemic changes. She remains in normal sinus rhythm. Plan colonoscopy has been placed on hold until cardiac evaluation. Echocardiogram has been ordered.  Review of systems complete and found to be negative unless listed above   Past Medical History  Diagnosis Date  . Hypertension   . Anxiety   . Asthma   . Bronchitis   . Fibromyalgia   . Pneumonia   . Edema   . Peptic ulcer disease   . Hiatal hernia   . GERD (gastroesophageal reflux disease)   . A-fib     cardioversion and TEE at [facility]  . Anxiety disorder 08/21/2011  . 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 Diverticulosis 10/01/2011  . MI, acute, non ST segment elevation 10/02/2011    Family History  Problem Relation Age of Onset  . Diabetes Mother     Deceased  . Hypertension Mother   . Cancer Father     Deceased  . Colon cancer Neg Hx   . Coronary artery disease Mother   . Heart failure Mother     History   Social History  . Marital Status: Married    Spouse Name: N/A    Number of Children: N/A  . Years of Education: N/A   Occupational History  . Disabled     Fibromyalgia   Social History Main Topics  . Smoking status: Former Smoker -- 1.0 packs/day for  15 years    Types: Cigarettes    Quit date: 06/24/1999  . Smokeless tobacco: Never Used  . Alcohol Use: No  . Drug Use: No  . Sexually Active: No   Other Topics Concern  . Not on file   Social History Narrative   Lives in [place] with husband    Past Surgical History  Procedure Date  . Abdominal hysterectomy     partial then complete  . Cholecystectomy   . Abdominal exploration surgery   . Abd tumor removed     states was 10 lbs, benign  . Knee surgery   . Bladder stent   . Esophagogastroduodenoscopy 08/24/2011    Procedure:  ESOPHAGOGASTRODUODENOSCOPY (EGD);  Surgeon: Corbin Ade, MD;  Location: [facility];  Service: Endoscopy;  Laterality: N/A;  give phenergan 12.5mg  iv 30 mins prior to procedure  . Colonoscopy 08/25/2011    Procedure: COLONOSCOPY;  Surgeon: Corbin Ade, MD;  Location: [facility];  Service: Endoscopy;  Laterality: N/A;     Prescriptions prior to admission  Medication Sig Dispense Refill  . albuterol (PROVENTIL HFA;VENTOLIN HFA) 108 (90 BASE) MCG/ACT inhaler Inhale 2 puffs into the lungs 4 (four) times daily as needed. For shortness of breath      . ALPRAZolam (XANAX) 1 MG tablet Take 1 mg by mouth 4 (four) times daily as needed. Anxiety      . aspirin 81 MG chewable tablet Chew 1 tablet (81 mg total) by mouth daily as needed. Chest Pains  Ok to restart on 3/10      . Cholecalciferol (VITAMIN D) 2000 UNITS tablet Take 2,000 Units by mouth daily.      [name] diltiazem (TIAZAC) 120 MG 24 hr capsule Take 120 mg by mouth daily.      . fish oil-omega-3 fatty acids 1000 MG capsule Take 1 g by mouth daily.      . furosemide (LASIX) 20 MG tablet Take 1 tablet (20 mg total) by mouth 2 (two) times daily.  30 tablet  1  . gabapentin (NEURONTIN) 300 MG capsule Take 300 mg by mouth 3 (three) times daily.        . iron polysaccharides (NIFEREX) 150 MG capsule Take 150 mg by mouth daily.        [name] lisinopril (PRINIVIL,ZESTRIL) 10 MG tablet Take 10 mg by mouth daily.      . metoprolol (LOPRESSOR) 50 MG tablet Take 50 mg by mouth 2 (two) times daily.      . pantoprazole (PROTONIX) 40 MG tablet Take 1 tablet (40 mg total) by mouth 2 (two) times daily before a meal.  60 tablet  1  . Rivaroxaban (XARELTO) 20 MG TABS Take 20 mg by mouth daily. Ok to restart on 09/02/11  30 tablet      Physical Exam: Blood pressure 145/82, pulse 76, temperature 98 F (36.7 C), temperature source Oral, resp. rate 13, height 5' (1.524 m), weight 201 lb 8 oz (91.4 kg), SpO2 100.00%.   General: Well developed, well nourished, in no  acute distress Head: Eyes PERRLA, No xanthomas.   Normal cephalic and atramatic  Lungs: Clear bilaterally to auscultation and percussion. Heart: HRRR S1 S2, without MRG.  Pulses are 2+ & equal radially.            No carotid bruit. No JVD.  No abdominal bruits. No femoral bruits. Abdomen: Bowel sounds are positive, hyperactive, abdomen soft and positive for tenderness without masses or   Hernia's noted.  Msk:  Back normal, normal gait. Normal strength and tone for age. Extremities: No clubbing, cyanosis 2+-3+ edema.  DP +1 Neuro: Alert and oriented X 3. Psych:  Good affect, responds appropriately Labs: Lab Results  Component Value Date   WBC 7.3 10/02/2011   HGB 10.3* 10/02/2011   HCT 31.1* 10/02/2011   MCV 81.8 10/02/2011   PLT 161 10/02/2011    Lab 10/02/11 0824  NA 139  K 4.1  CL 99  CO2 33*  BUN 11  CREATININE 0.83  CALCIUM 9.1  PROT 6.5  BILITOT 0.8  ALKPHOS 82  ALT 13  AST 26  GLUCOSE 137*   Lab Results  Component Value Date   CKTOTAL 213* 10/01/2011   CKMB 10.0* 10/01/2011   TROPONINI 1.86* 10/01/2011    Radiology:Pending CT and CXR results  EKG:NSR without evidence of ischemia    ASSESSMENT AND PLAN:   1. Non-ST elevation MI Type II: This is likely related to demand ischemia in the setting of severe anemia. Patient's EKG does not reveal any ischemic changes. Echocardiogram is pending. We will get records from [facility] concerning admission in March with TEE and cardioversion. We will also request records from Dr. Ernestene Mention office concerning his followup visit and use Xarelto. With GI bleed would not recommend any anticoagulation now or in the future. She states that she is due to see Dr. Perrin Maltese in July for a stress test may consider doing one here while admitted . It echocardiogram reveals abnormalities. We will check BNP and TSH.  2. History of atrial fibrillation: She remains in normal sinus rhythm. Home dose of metoprolol 50 mg twice a day and diltiazem on 120 mg  daily at home. Blood pressure remains stable. As stated add coagulations are not an option for this patient with recurrent GI bleed.  3. Recurrent GI bleed: GI workup is currently on hold until cardiology has cleared. The patient for further testing. It is likely that she will be able to undergo colonoscopy in a.m. if echocardiogram does not show any significant abnormalities. Doubt any further cardiac workup will be necessary that would prevent her from proceeding.   Bettey Mare. Lyman Bishop NP [facility] 10/02/2011, 9:44 AM  Cardiology Attending Patient interviewed and examined. Discussed with Joni Reining, NP.  Above note annotated and modified based upon my findings.  She has done well from a cardiac standpoint since cardioversion 6 weeks ago. There is no clinical evidence for recurrence, and risk for thromboembolism was only mild to moderate to begin with. Accordingly, she should remain off anticoagulants until there is evidence for recurrent atrial fibrillation. Troponin elevation in the setting of physiologic stress is of some concern, but the absence of EKG abnormalities and symptoms of myocardial ischemia are somewhat reassuring. Importance of identifying her source of bleeding exceeds the risk of a cardiac event with colonoscopy, which is fairly low stress procedure, and I recommend that the test proceed tomorrow. Cardiology will be available to assist as needed and will continue to follow this nice woman. Impeccable control of hypertension is the most important aspect of her cardiac care at present. She will require pharmacologic stress testing in the near future, but this need not be done urgently. Echocardiogram has been completed and will be reviewed.  Titrate beta blocker dose upwards as tolerated for better control of hypertension and potentially to decrease the likelihood of recurrent atrial fibrillation.  [place] Bing, MD 10/02/2011, 5:21 PM

## 2011-10-03 ENCOUNTER — Inpatient Hospital Stay (HOSPITAL_COMMUNITY): Payer: Medicare Other

## 2011-10-03 ENCOUNTER — Encounter (HOSPITAL_COMMUNITY): Payer: Self-pay | Admitting: *Deleted

## 2011-10-03 ENCOUNTER — Encounter (HOSPITAL_COMMUNITY)
Admission: EM | Disposition: A | Discharge status: Home / Self Care | Payer: Self-pay | Source: Home / Self Care | Attending: Internal Medicine

## 2011-10-03 DIAGNOSIS — E669 Obesity, unspecified: Secondary | ICD-10-CM

## 2011-10-03 DIAGNOSIS — K625 Hemorrhage of anus and rectum: Secondary | ICD-10-CM

## 2011-10-03 DIAGNOSIS — R739 Hyperglycemia, unspecified: Secondary | ICD-10-CM | POA: Diagnosis present

## 2011-10-03 DIAGNOSIS — Z8719 Personal history of other diseases of the digestive system: Secondary | ICD-10-CM

## 2011-10-03 DIAGNOSIS — D649 Anemia, unspecified: Secondary | ICD-10-CM

## 2011-10-03 DIAGNOSIS — Z8679 Personal history of other diseases of the circulatory system: Secondary | ICD-10-CM

## 2011-10-03 HISTORY — PX: COLONOSCOPY: SHX5424

## 2011-10-03 LAB — BASIC METABOLIC PANEL
CO2: 33 mEq/L — ABNORMAL HIGH (ref 19–32)
Chloride: 101 mEq/L (ref 96–112)
Creatinine, Ser: 0.85 mg/dL (ref 0.50–1.10)
GFR calc Af Amer: 79 mL/min — ABNORMAL LOW (ref >90)
Potassium: 3.7 mEq/L (ref 3.5–5.1)
Sodium: 141 mEq/L (ref 135–145)

## 2011-10-03 LAB — HEMOGLOBIN A1C
Hgb A1c MFr Bld: 5.5 % (ref <5.7)
Mean Plasma Glucose: 111 mg/dL (ref <117)

## 2011-10-03 LAB — CBC
MCV: 82.6 fL (ref 78.0–100.0)
Platelets: 184 10*3/uL (ref 150–400)
RBC: 4.14 MIL/uL (ref 3.87–5.11)
RDW: 15.2 % (ref 11.5–15.5)
WBC: 7.3 10*3/uL (ref 4.0–10.5)

## 2011-10-03 SURGERY — COLONOSCOPY
Anesthesia: Moderate Sedation

## 2011-10-03 MED ORDER — RAMIPRIL 2.5 MG PO CAPS: 5.0000 mg | ORAL_CAPSULE | Freq: Every day | ORAL | Status: DC

## 2011-10-03 MED ORDER — ATORVASTATIN CALCIUM 20 MG PO TABS
20.0000 mg | ORAL_TABLET | Freq: Every day | ORAL | Status: DC
  Administered 2011-10-04 – 2011-10-05 (×2): 20 mg via ORAL
  Filled 2011-10-03 (×2): qty 1

## 2011-10-03 MED ORDER — SODIUM CHLORIDE 0.9 % IJ SOLN
INTRAMUSCULAR | Status: AC
  Administered 2011-10-03: 10 mL
  Filled 2011-10-03: qty 3

## 2011-10-03 MED ORDER — SODIUM CHLORIDE 0.45 % IV SOLN
Freq: Once | INTRAVENOUS | Status: AC
  Administered 2011-10-03: 11:00:00 via INTRAVENOUS

## 2011-10-03 MED ORDER — PROMETHAZINE HCL 25 MG/ML IJ SOLN
INTRAMUSCULAR | Status: DC | PRN
  Administered 2011-10-03: 12.5 mg via INTRAVENOUS

## 2011-10-03 MED ORDER — PEG 3350-KCL-NABCB-NACL-NASULF 236 G PO SOLR
4000.0000 mL | Freq: Once | ORAL | Status: DC
  Filled 2011-10-03: qty 4000

## 2011-10-03 MED ORDER — SODIUM CHLORIDE 0.9 % IJ SOLN
INTRAMUSCULAR | Status: AC
  Filled 2011-10-03: qty 10

## 2011-10-03 MED ORDER — MIDAZOLAM HCL 5 MG/5ML IJ SOLN
INTRAMUSCULAR | Status: DC | PRN
  Administered 2011-10-03 (×4): 2 mg via INTRAVENOUS

## 2011-10-03 MED ORDER — PEG 3350-KCL-NABCB-NACL-NASULF 236 G PO SOLR
4000.0000 mL | Freq: Once | ORAL | Status: AC
  Administered 2011-10-03: 4000 mL via ORAL
  Filled 2011-10-03: qty 4000

## 2011-10-03 MED ORDER — PROMETHAZINE HCL 25 MG/ML IJ SOLN
INTRAMUSCULAR | Status: AC
  Filled 2011-10-03: qty 1

## 2011-10-03 MED ORDER — MEPERIDINE HCL 100 MG/ML IJ SOLN
INTRAMUSCULAR | Status: DC | PRN
  Administered 2011-10-03 (×3): 50 mg via INTRAVENOUS

## 2011-10-03 MED ORDER — PROMETHAZINE HCL 25 MG/ML IJ SOLN: 25.0000 mg | INTRAMUSCULAR | Status: DC

## 2011-10-03 MED ORDER — PROMETHAZINE HCL 25 MG/ML IJ SOLN
12.5000 mg | INTRAMUSCULAR | Status: AC
  Administered 2011-10-03: 12.5 mg via INTRAVENOUS
  Filled 2011-10-03: qty 1

## 2011-10-03 MED ORDER — MIDAZOLAM HCL 5 MG/5ML IJ SOLN
INTRAMUSCULAR | Status: AC
  Filled 2011-10-03: qty 10

## 2011-10-03 MED ORDER — SODIUM CHLORIDE 0.9 % IJ SOLN
INTRAMUSCULAR | Status: AC
  Filled 2011-10-03: qty 3

## 2011-10-03 MED ORDER — SODIUM CHLORIDE 0.9 % IJ SOLN
INTRAMUSCULAR | Status: AC
  Administered 2011-10-04: 10 mL
  Filled 2011-10-03: qty 3

## 2011-10-03 MED ORDER — PROMETHAZINE HCL 25 MG/ML IJ SOLN
12.5000 mg | INTRAMUSCULAR | Status: AC
  Administered 2011-10-03: 12.5 mg via INTRAVENOUS

## 2011-10-03 MED ORDER — MEPERIDINE HCL 100 MG/ML IJ SOLN
INTRAMUSCULAR | Status: AC
  Filled 2011-10-03: qty 2

## 2011-10-03 NOTE — Progress Notes (Signed)
Subjective: Concerned about colonoscopy. States remembers everything from prior procedure (Done with Demerol and Versed, not Phenergan as previously thought). No further rectal bleeding. Denies chest pain. Abdomen sore. NPO. No N/V. Cleared from cardiac standpoint for procedure.   Objective: Vital signs in last 24 hours: Temp:  [97.9 F (36.6 C)-98.7 F (37.1 C)] 97.9 F (36.6 C) (04/10 0800) Pulse Rate:  [57-95] 57  (04/10 0700) Resp:  [15-26] 20  (04/10 0800) BP: (115-174)/(57-127) 163/79 mmHg (04/10 0800) SpO2:  [89 %-100 %] 100 % (04/10 0700) Weight:  [201 lb 11.5 oz (91.5 kg)] 201 lb 11.5 oz (91.5 kg) (04/10 0432) Last BM Date: 10/02/11 General:   Alert and oriented, pleasant Head:  Normocephalic and atraumatic. Eyes:  No icterus, sclera clear. Conjuctiva pink.  Heart:  S1, S2 present, no murmurs noted.  Lungs: Clear to auscultation bilaterally, diminished bases Abdomen:  Bowel sounds present, soft, "sore", non-distended. Possible small umbilical hernia Msk:  Symmetrical without gross deformities. Normal posture. Extremities:  3+ pedal edema, 2-3+ lower extremity edema Neurologic:  Alert and  oriented x4;  grossly normal neurologically. Psych:  Alert and cooperative. Normal mood and affect.  Intake/Output from previous day: 04/09 0701 - 04/10 0700 In: 1610 [I.V.:1610] Out: 3000 [Urine:3000] Intake/Output this shift:    Lab Results:  Basename 10/03/11 0434 10/02/11 0824 10/01/11 2316  WBC 7.3 7.3 10.5  HGB 11.0* 10.3* 8.2*  HCT 34.2* 31.1* 25.5*  PLT 184 161 199   BMET  Basename 10/03/11 0434 10/02/11 0824 10/01/11 0401 10/01/11 0342  NA 141 139 143 --  K 3.7 4.1 3.7 --  CL 101 99 104 --  CO2 33* 33* -- 32  GLUCOSE 129* 137* 163* --  BUN 7 11 24* --  CREATININE 0.85 0.83 0.90 --  CALCIUM 9.5 9.1 -- 9.1   LFT  Basename 10/02/11 0824 10/01/11 0910 10/01/11 0342  PROT 6.5 7.2 6.7  ALBUMIN 3.4* 3.6 3.3*  AST 26 36 17  ALT 13 18 13   ALKPHOS 82 88 100    BILITOT 0.8 0.4 0.1*  BILIDIR -- <0.1 --  IBILI -- NOT CALCULATED --   PT/INR  Basename 10/01/11 0910  LABPROT 16.7*  INR 1.33     Assessment: 71 year old female with readmission secondary to rectal bleeding, anemia. Admitting Hgb 7.1, responded well to transfusions, now 11. EGD/TCS performed March 2013 with no upper source of bleed identified, lower GI evaluation with multiple cecal AVMs s/p APC thermal sealing. H.pylori serology negative. While inpatient this admission, bump in troponin, c/w NSTEMI r/t demand ischemia in setting of severe anemia. No ischemic changes on EKG. 2D echo pending. Cleared from cardiology standpoint, see Dr. Marvel Plan note from 10/02/11.   As side note, she did not receive Phenergan prior to her last procedure as I had originally thought. Reports remembering "everything". Will proceed with 12.5 mg on call due to pt's age. Melanie in endo aware.  Plan: Proceed with TCS today, pt has been NPO, needs prep Phenergan 12.5 mg on call for procedure  Monitor Hgb Supportive measures Further recommendations after procedure    LOS: 2 days   Gerrit Halls  10/03/2011, 8:42 AM   Discussed colonoscopy at length with the patient at the bedside. Risks, benefits, limitations alternatives and imponderables have been reviewed. We'll utilize phenergan to enhance sedation. Patient's questions answered. She is agreeable. Further recommendations to follow.

## 2011-10-03 NOTE — H&P (View-Only) (Signed)
Subjective: Since I last evaluated the patient  Remains alert. She says she has not seen any further bleeding. C/o diffuse abdominal tenderness on palpation. Troponin elevated. Hemoglobin 8.2. She has received one unit of PRBCs today and will receive one more.  No chest pain.  Objective: Vital signs in last 24 hours: Temp:  [97.2 F (36.2 C)-98.6 F (37 C)] 97.4 F (36.3 C) (04/09 0645) Pulse Rate:  [59-107] 66  (04/09 0645) Resp:  [10-22] 11  (04/09 0645) BP: (100-186)/(55-119) 118/64 mmHg (04/09 0645) SpO2:  [92 %-100 %] 100 % (04/09 0600) Weight:  [201 lb 8 oz (91.4 kg)] 201 lb 8 oz (91.4 kg) (04/09 0454) Last BM Date: 10/01/11  Intake/Output from previous day: 04/08 0701 - 04/09 0700 In: 1269.2 [Blood:1269.2] Out: 1902 [Urine:1900; Stool:2] Intake/Output this shift:    General appearance: appears stated ageAlert and oriented. Skin warm and dry. Oral mucosa is moist.   . Sclera anicteric, conjunctivae is pink. Lungs clear. Heart regular rate and rhythm.   NS on monitor. Abdomen is soft. Bowel sounds are positive. No hepatomegaly. No abdominal masses felt.  Diffuse tenderness.  3+ edema to lower extremities.  Lab Results:  Basename 10/01/11 2316 10/01/11 1522 10/01/11 0401 10/01/11 0342  WBC 10.5 9.9 -- 8.0  HGB 8.2* 8.1* 8.2* --  HCT 25.5* 24.7* 24.0* --  PLT 199 180 -- 210   BMET  Basename 10/01/11 0401 10/01/11 0342  NA 143 141  K 3.7 3.7  CL 104 102  CO2 -- 32  GLUCOSE 163* 168*  BUN 24* 24*  CREATININE 0.90 0.93  CALCIUM -- 9.1   LFT  Basename 10/01/11 0910  PROT 7.2  ALBUMIN 3.6  AST 36  ALT 18  ALKPHOS 88  BILITOT 0.4  BILIDIR <0.1  IBILI NOT CALCULATED   PT/INR  Basename 10/01/11 0910  LABPROT 16.7*  INR 1.33   Hepatitis Panel No results found for this basename: HEPBSAG,HCVAB,HEPAIGM,HEPBIGM in the last 72 hours C-Diff No results found for this basename: CDIFFTOX:3 in the last 72 hours Fecal Lactopherrin No results found for this  basename: FECLLACTOFRN in the last 72 hours  Studies/Results: No results found.  Medications: I have reviewed the patient's current medications.  Assessment/Plan: Probable lower GI bleed from cecal AVM.  Will continue to monitor. Needs to be cleared by cardiology before attempting colonoscopy/APC therapy.   LOS: 1 day   SETZER,TERRI W 10/02/2011, 8:30 AM  GI attending note; Patient not passing blood per rectum anymore. She was able to finish GoLYTELY prep last evening. Posttransfusion H&H is pending. There's been significant bump in troponin levels therefore a colonoscopy postponed. Dr. Rourk will be seeing patient on October 03, 2011 for GI issues. 

## 2011-10-03 NOTE — Progress Notes (Signed)
Subjective:  Patient quite anxious about her colonoscopy.  Objective:  Vital Signs in the last 24 hours: Temp:  [97.9 F (36.6 C)-98.7 F (37.1 C)] 97.9 F (36.6 C) (04/10 0800) Pulse Rate:  [57-95] 57  (04/10 0700) Resp:  [15-26] 20  (04/10 0800) BP: (115-174)/(57-127) 163/79 mmHg (04/10 0800) SpO2:  [89 %-100 %] 100 % (04/10 0700) Weight:  [201 lb 11.5 oz (91.5 kg)] 201 lb 11.5 oz (91.5 kg) (04/10 0432)  Intake/Output from previous day: 04/09 0701 - 04/10 0700 In: 1610 [I.V.:1610] Out: 3000 [Urine:3000] Intake/Output from this shift:    Physical Exam: NECK: Without JVD, HJR, or bruit LUNGS: decreased breath sounds with some expiratory wheezing HEART: Regular rate and rhythm, no murmur, gallop, rub, bruit, thrill, or heave EXTREMITIES: +2 edema in the feet and lower legs bilaterally   Lab Results:  Basename 10/03/11 0434 10/02/11 0824  WBC 7.3 7.3  HGB 11.0* 10.3*  PLT 184 161    Basename 10/03/11 0434 10/02/11 0824  NA 141 139  K 3.7 4.1  CL 101 99  CO2 33* 33*  GLUCOSE 129* 137*  BUN 7 11  CREATININE 0.85 0.83    Basename 10/01/11 2316 10/01/11 1521  TROPONINI 1.86* 1.31*   Hepatic Function Panel  Basename 10/02/11 0824 10/01/11 0910  PROT 6.5 --  ALBUMIN 3.4* --  AST 26 --  ALT 13 --  ALKPHOS 82 --  BILITOT 0.8 --  BILIDIR -- <0.1  IBILI -- NOT CALCULATED    Basename 10/02/11 0826  CHOL 200   No results found for this basename: PROTIME in the last 72 hours  Imaging: 2-D echo pending    Assessment/Plan:  #1 status post MI in the setting of a physiological stress secondary to GI bleed. Positive troponins but no EKG changes.Will plan stress test in the near future. 2-D echo pending #2 atrial fibrillation status post DC cardioversion 6 weeks ago, no recurrence,now off Xarelta secondary to GI bleed, followed by Dr. Theresa Duty [facility] #3 hypertension Suspect her blood pressure is elevated today because of her anxiety. She is  getting Xanax. I will add an ACE inhibitor for better blood pressure control. #4 GI bleed, for colonoscopy today #5 chronic lower extremity edema, patient has worn TED hose in the past but has trouble getting them on. #6 question of COPD, patient wheezing slightly, will check chest x-ray. Patient uses inhalers at home but is not receiving here.  LOS: 2 days    Jacolyn Reedy 10/03/2011, 8:21 AM

## 2011-10-03 NOTE — Progress Notes (Addendum)
Subjective: The patient slept well overnight. Her Xanax when necessary was restarted. There is no evidence of ongoing bloody bowel movements. Nursing reports some episodic transient chest pain overnight, but she has no chest pain now.  Objective: Vital signs in last 24 hours: Filed Vitals:   10/03/11 0400 10/03/11 0432 10/03/11 0500 10/03/11 0600  BP: 165/67  165/68 153/70  Pulse: 69  68 66  Temp:      TempSrc:      Resp: 22  16 26   Height:      Weight:  91.5 kg (201 lb 11.5 oz)    SpO2: 100%  100% 100%    Intake/Output Summary (Last 24 hours) at 10/03/11 0735 Last data filed at 10/03/11 0700  Gross per 24 hour  Intake   1610 ml  Output   3000 ml  Net  -1390 ml    Weight change: 2.8 kg (6 lb 2.8 oz)  Physical exam: Gen.: Pleasant obese 71 year old Caucasian woman sitting up in, in no acute distress. Lungs: Clear to auscultation bilaterally. Heart: S1, S2, with soft systolic murmur. Abdomen: Morbidly obese, positive bowel sounds, soft, mildly diffusely tender, no rigidity, nondistended. Extremities: Trace to 1 and 1+ nonpitting edema.  Lab Results: Basic Metabolic Panel:  Basename 10/03/11 0434 10/02/11 0824  NA 141 139  K 3.7 4.1  CL 101 99  CO2 33* 33*  GLUCOSE 129* 137*  BUN 7 11  CREATININE 0.85 0.83  CALCIUM 9.5 9.1  MG -- --  PHOS -- --   Liver Function Tests:  Basename 10/02/11 0824 10/01/11 0910  AST 26 36  ALT 13 18  ALKPHOS 82 88  BILITOT 0.8 0.4  PROT 6.5 7.2  ALBUMIN 3.4* 3.6   No results found for this basename: LIPASE:2,AMYLASE:2 in the last 72 hours No results found for this basename: AMMONIA:2 in the last 72 hours CBC:  Basename 10/03/11 0434 10/02/11 0824  WBC 7.3 7.3  NEUTROABS -- --  HGB 11.0* 10.3*  HCT 34.2* 31.1*  MCV 82.6 81.8  PLT 184 161   Cardiac Enzymes:  Basename 10/01/11 2316 10/01/11 1521 10/01/11 0910  CKTOTAL 213* 162 118  CKMB 10.0* 9.7* 3.9  CKMBINDEX -- -- --  TROPONINI 1.86* 1.31* 0.35*    BNP:  Basename 10/02/11 0830  PROBNP 2360.0*   D-Dimer: No results found for this basename: DDIMER:2 in the last 72 hours CBG: No results found for this basename: GLUCAP:6 in the last 72 hours Hemoglobin A1C: No results found for this basename: HGBA1C in the last 72 hours Fasting Lipid Panel:  Basename 10/02/11 0826  CHOL 200  HDL 47  LDLCALC 131*  TRIG 111  CHOLHDL 4.3  LDLDIRECT --   Thyroid Function Tests:  Basename 10/02/11 0830  TSH 2.859  T4TOTAL --  FREET4 --  T3FREE --  THYROIDAB --   Anemia Panel: No results found for this basename: VITAMINB12,FOLATE,FERRITIN,TIBC,IRON,RETICCTPCT in the last 72 hours Coagulation:  Basename 10/01/11 0910  LABPROT 16.7*  INR 1.33   Urine Drug Screen: Drugs of Abuse  No results found for this basename: labopia,  cocainscrnur,  labbenz,  amphetmu,  thcu,  labbarb    Alcohol Level: No results found for this basename: ETH:2 in the last 72 hours Urinalysis:  Basename 10/01/11 1425  COLORURINE YELLOW  LABSPEC 1.015  PHURINE 6.5  GLUCOSEU 100*  HGBUR TRACE*  BILIRUBINUR NEGATIVE  KETONESUR NEGATIVE  PROTEINUR NEGATIVE  UROBILINOGEN 0.2  NITRITE NEGATIVE  LEUKOCYTESUR NEGATIVE   Misc. Labs:  EKG  October 02, 2011: Normal sinus rhythm with sinus arrhythmia, heart rate 68 beats per minute, Q waves in the inferior leads. EKG January 31 2012: Normal sinus rhythm, heart rate 82 beats per minute, Q waves in the inferior leads.   Micro: Recent Results (from the past 240 hour(s))  MRSA PCR SCREENING     Status: Normal   Collection Time   10/01/11  8:43 AM      Component Value Range Status Comment   MRSA by PCR NEGATIVE  NEGATIVE  Final     Studies/Results: No results found.  Medications: I have reviewed the patient's current medications.  Assessment: Principal Problem:  *GI bleed Active Problems:  Acute blood loss anemia  History of atrial fibrillation  Obesity  Benign hypertension  Anxiety disorder   Peripheral edema  Asthma  Bilateral arm pain  Atrial fibrillation  Azotemia  AVM (arteriovenous malformation) of colon  Diverticulosis  MI, acute, non ST segment elevation  Hyperglycemia    1. Recurrent GI bleeding in the setting of Xarelto treatment. She is on IV Protonix. Her EGD in March revealed pyloric channel erosions and her colonoscopy revealed colonic diverticulosis and multiple cecal AVMs, status post APC thermal sealing by Dr. Jena Gauss. Per Dr. Luvenia Starch comments  then, if she rebled in the future from cecal AVMs, consider angiographic embolization versus right hemicolectomy. Endoscopy/colonoscopy was postponed yesterday due to elevated cardiac enzymes. Her GI bleeding has stopped for now.   2. Acute blood loss anemia. She is status post a total of 4 units of packed red blood cell transfusions. Her hemoglobin has improved appropriately. She also received 2 units of fresh frozen plasma.  3. Elevated cardiac enzymes, consistent with non-ST elevation myocardial infarction. Dr. Marvel Plan assessment is noted and appreciated. Heparin/Lovenox and aspirin are currently contraindicated in the setting of an active GI bleed. She is already on a beta blocker. It has been titrated up to 75 mg twice a day by cardiology. Nitroglycerin paste was discontinued.  2-D echocardiogram has been ordered and was done but has not been read yet.  4. History of hypertension in the setting of hypovolemia. Her blood pressure has trended up. The Toprol was titrated up. Diltiazem was restarted. Now that she is amply hydrated, will decrease the rate of the IV fluids. Consider restarting lisinopril.  5. Chronic atrial fibrillation. She is intermittently in normal sinus rhythm. Apparently she had an ablation procedure in [place] a couple of months ago. She is on Xarelto chronically. This is currently being held.  6. Chronic peripheral/lower extremity edema. She was given Lasix between transfusions. 2-D echocardiogram results  are pending.  7. Mild hyperglycemia. We'll order hemoglobin A1c.  8. Hyperlipidemia. Will start statin therapy.  Plan:  1. 2-D echocardiogram results pending. 2. GI followup pending. Her cardiology's recommendation, endoscopic evaluation can occur. 3. Start Lipitor. 4. Decrease IV fluids. Consider restarting lisinopril in the next day or 2 if her blood pressure has not improved and if there is evidence of left ventricular dysfunction. 5. We'll check a hemoglobin A1c.   Critical care time: 45 minutes.   LOS: 2 days   Sharon Compton 10/03/2011, 7:35 AM

## 2011-10-03 NOTE — Interval H&P Note (Signed)
History and Physical Interval Note:  10/03/2011 10:59 AM  Sharon Compton  has presented today for surgery, with the diagnosis of Rectal bleeding, anemia, likely secondary to cecal AVMs  The various methods of treatment have been discussed with the patient and family. After consideration of risks, benefits and other options for treatment, the patient has consented to  Procedure(s) (LRB): COLONOSCOPY (N/A) as a surgical intervention .  The patients' history has been reviewed, patient examined, no change in status, stable for surgery.  I have reviewed the patients' chart and labs.  Questions were answered to the patient's satisfaction.     Eula Listen

## 2011-10-03 NOTE — Progress Notes (Signed)
Patient seen in consultation by Dr. Dietrich Pates on 4/9. She is followed by a cardiologist, Dr. Titus Mould, physician from in [facility] that works in [place]. History reviewed including TEE guided cardioversion of atrial fibrillation back in March. She has been treated with Xarelto by Dr. Titus Mould. She states that she saw him last week.  Now admitted after GI bleed with anemia requiring packed red cell transfusion. She is being followed by gastroenterology with plans for endoscopy today. Xarelto has been held. Hemoglobin is up to 11.0.  She reports no active chest pain, fortunately remains in sinus rhythm. No palpitations. Otherwise hemodynamically stable. ECG shows sinus rhythm with normal intervals.  She is also noted to have abnormal cardiac markers suggestive of NSTEMI (troponin up to 1.8), possibly type II event. As noted, she denies any chest pain or breathlessness. Pro-BNP elevated at 2360. Echocardiogram has been ordered and is pending at this time for assessment of LVEF.  Current medications reviewed including Cardizem CD, Lipitor, Lopressor, Altace, PPI. She is off all anticoagulants, not on aspirin. We will continue to follow with you as she undergoes further GI workup. Depending on her clinical progress and further objective testing, can determine if further ischemic workup is needed now, realizing that this may be limited from an invasive perspective in light of her recent GI bleed.  Either way, she will clearly require close outpatient followup with Dr. Titus Mould.  Jonelle Sidle, M.D., F.A.C.C.

## 2011-10-03 NOTE — Progress Notes (Signed)
Paged Dr. Jena Gauss for clarification on bowel prep instructions. Start bowel prep now, pt NPO at midnight, and enema x2 in am.

## 2011-10-03 NOTE — OR Nursing (Signed)
Patient scheduled for colonoscopy. Patient was originally scheduled for yesterday but procedure was postponed. Patient states she has not had anything to eat since drinking prep, only clear liquids. Another prep was ordered by Gerrit Halls, NP. Patient states, that her stool is yellow liquid. Dr. Jena Gauss notified and will proceed with procedure without further prepping.

## 2011-10-03 NOTE — Op Note (Signed)
[facility] [address] [place], [place]  [address]  COLONOSCOPY PROCEDURE REPORT  PATIENT:  Sharon Compton, Sharon Compton  MR#:  [id number] BIRTHDATE:  [birth year], 70 yrs. old  GENDER:  female ENDOSCOPIST:  R. Roetta Sessions, MD FACP [facility] REF. BY:          Hospitalist PROCEDURE DATE:  10/03/2011 PROCEDURE:  Attempted colonoscopy  INDICATIONS:  Recurrent hematochezia  INFORMED CONSENT:  The risks, benefits, alternatives and imponderables including but not limited to bleeding, perforation as well as the possibility of a missed lesion have been reviewed. The potential for biopsy, lesion removal, etc. have also been discussed.  Questions have been answered.  All parties agreeable. Please see the history and physical in the medical record for more information.  MEDICATIONS:  Demerol 150 mg IV and Demerol 8 mg IV in divided doses. Phenergan 25 mg IV to augment conscious sedation.  DESCRIPTION OF PROCEDURE:  After a digital rectal exam was performed, the EC-[id number] ([id number]) colonoscope was advanced from the anus into the rectum. <<PROCEDUREIMAGES>>  FINDINGS:   Inadequate preparation with semi-formed stool in the rectum which precluded completion of the examination.  THERAPEUTIC / DIAGNOSTIC MANEUVERS PERFORMED: None  COMPLICATIONS:  None  CECAL WITHDRAWAL TIME: NA  IMPRESSION:  Attempted colonoscopy-  inadequate preparation.  RECOMMENDATIONS: Clear liquid diet. Repeat Preparation. Tentatively planned for colonoscopy April 11.  ______________________________ R. Roetta Sessions, MD Caleen Essex  CC:  n. eSIGNED:   R. Roetta Sessions at 10/03/2011 11:41 AM  Ardine Eng, [phone]

## 2011-10-03 NOTE — Progress Notes (Signed)
1110 Had M. Bradsher, RN check IV in right hand. No pain noted from patient.( Edema noted to feet, legs, and hands.) IV running slowly, flushed, with blood return noted. Changed IV to right AC. No infiltration noted at this time.

## 2011-10-04 ENCOUNTER — Encounter (HOSPITAL_COMMUNITY): Payer: Medicare Other

## 2011-10-04 ENCOUNTER — Other Ambulatory Visit: Payer: Self-pay

## 2011-10-04 ENCOUNTER — Inpatient Hospital Stay (HOSPITAL_COMMUNITY): Payer: Medicare Other

## 2011-10-04 ENCOUNTER — Encounter (HOSPITAL_COMMUNITY)
Admission: EM | Disposition: A | Discharge status: Home / Self Care | Payer: Self-pay | Source: Home / Self Care | Attending: Internal Medicine

## 2011-10-04 ENCOUNTER — Encounter (HOSPITAL_COMMUNITY): Payer: Self-pay | Admitting: *Deleted

## 2011-10-04 HISTORY — PX: COLONOSCOPY: SHX5424

## 2011-10-04 LAB — CARDIAC PANEL(CRET KIN+CKTOT+MB+TROPI)
CK, MB: 3 ng/mL (ref 0.3–4.0)
Relative Index: INVALID (ref 0.0–2.5)
Relative Index: INVALID (ref 0.0–2.5)
Total CK: 87 U/L (ref 7–177)
Troponin I: 0.61 ng/mL (ref <0.30)
Troponin I: 0.73 ng/mL (ref <0.30)

## 2011-10-04 LAB — CBC
Hemoglobin: 10.5 g/dL — ABNORMAL LOW (ref 12.0–15.0)
Platelets: 150 10*3/uL (ref 150–400)
RBC: 3.92 MIL/uL (ref 3.87–5.11)
WBC: 5.6 10*3/uL (ref 4.0–10.5)

## 2011-10-04 SURGERY — COLONOSCOPY
Anesthesia: Moderate Sedation

## 2011-10-04 MED ORDER — NITROGLYCERIN 0.4 MG SL SUBL
0.4000 mg | SUBLINGUAL_TABLET | Freq: Once | SUBLINGUAL | Status: AC
  Administered 2011-10-04: 0.4 mg via SUBLINGUAL

## 2011-10-04 MED ORDER — PROMETHAZINE HCL 25 MG/ML IJ SOLN
25.0000 mg | Freq: Once | INTRAMUSCULAR | Status: AC
  Administered 2011-10-04: 25 mg via INTRAVENOUS

## 2011-10-04 MED ORDER — METOPROLOL TARTRATE 50 MG PO TABS
75.0000 mg | ORAL_TABLET | Freq: Three times a day (TID) | ORAL | Status: DC
  Administered 2011-10-04 – 2011-10-06 (×4): 75 mg via ORAL
  Filled 2011-10-04 (×4): qty 1

## 2011-10-04 MED ORDER — PROMETHAZINE HCL 25 MG/ML IJ SOLN
INTRAMUSCULAR | Status: AC
  Filled 2011-10-04: qty 1

## 2011-10-04 MED ORDER — NITROGLYCERIN 0.4 MG SL SUBL
SUBLINGUAL_TABLET | SUBLINGUAL | Status: AC
  Filled 2011-10-04: qty 25

## 2011-10-04 MED ORDER — SODIUM CHLORIDE 0.9 % IJ SOLN
INTRAMUSCULAR | Status: AC
  Filled 2011-10-04: qty 10

## 2011-10-04 MED ORDER — NITROGLYCERIN 2 % TD OINT
0.5000 [in_us] | TOPICAL_OINTMENT | Freq: Four times a day (QID) | TRANSDERMAL | Status: DC
  Administered 2011-10-04 – 2011-10-06 (×7): 0.5 [in_us] via TOPICAL
  Filled 2011-10-04 (×7): qty 1

## 2011-10-04 MED ORDER — SODIUM CHLORIDE 0.45 % IV SOLN
Freq: Once | INTRAVENOUS | Status: AC
  Administered 2011-10-04: 14:00:00 via INTRAVENOUS

## 2011-10-04 MED ORDER — SODIUM CHLORIDE 0.9 % IJ SOLN
10.0000 mL | Freq: Two times a day (BID) | INTRAMUSCULAR | Status: DC
  Administered 2011-10-04: 20 mL
  Administered 2011-10-04 – 2011-10-05 (×2): 10 mL
  Filled 2011-10-04: qty 6
  Filled 2011-10-04: qty 3

## 2011-10-04 MED ORDER — LISINOPRIL 10 MG PO TABS
10.0000 mg | ORAL_TABLET | Freq: Every day | ORAL | Status: DC
  Administered 2011-10-04: 10 mg via ORAL
  Filled 2011-10-04: qty 1

## 2011-10-04 MED ORDER — SODIUM CHLORIDE 0.9 % IJ SOLN: 10.0000 mL | INTRAMUSCULAR | Status: DC | PRN

## 2011-10-04 NOTE — OR Nursing (Signed)
Patient states chest pain is 6/10 now. Arms feel normal now. BP 163/86, P-102, R- 16 and O2 sat 100% on 3 liters.

## 2011-10-04 NOTE — OR Nursing (Signed)
Report called to [facility] RN. Patient transported via stretcher to ICU by Trenton Gammon RN and Nena Polio RN on the monitor. Patient stated pain was 6/10 at time of transport.

## 2011-10-04 NOTE — Progress Notes (Signed)
Tap water enema given with results,watery near clear stool to return,patient tolerated enema without difficulty.

## 2011-10-04 NOTE — OR Nursing (Signed)
Patient to pre-op for a scheduled TCS. Patient began to complain of chest pain 9/10 and stated that it was "stabbing." Patient also complained of "squeezing numbness" in arms. Patient questioned further and stated left was worse than right. Dr. Jena Gauss notifed and ordered a 12 lead EKG, and Nitroglycerin SL X 1. Orders carried out. BP 225/107, Pulse 100, Respirations 20, O2 sat 100% on 3 Liters.

## 2011-10-04 NOTE — Progress Notes (Signed)
Patient seen in preoperative area prior to colonoscopy. Patient complaining of chest pain 8/10 severity-  retrosternal chest pain radiating into both shoulder blades.  BP elevated 225/107.  EKG currently demonstrates a sinus tachycardia with 2 mm of ST segment depression in lead 1 compared to tracing at 0300 this morning. 1 sublingual nitroglycerin administered-  with diminution in chest pain 2/10 severity. Colonoscopy canceled. I informed Dr. Sherrie Mustache.  Patient being returned to her room. Clear liquid diet. Further cardiology evaluation pending.

## 2011-10-04 NOTE — Progress Notes (Signed)
Subjective:  No new complaints except for swollen right hand and a previous IV site. No complaints of chest pain. No complaints of bloody stools overnight. No complaints of nausea or vomiting.    Objective: Vital signs in last 24 hours: Filed Vitals:   10/04/11 0400 10/04/11 0500 10/04/11 0600 10/04/11 0606  BP: 112/58 150/76    Pulse:  64 72 63  Temp: 98.7 F (37.1 C)     TempSrc: Oral     Resp: 14 18 18 17   Height:      Weight:  91.1 kg (200 lb 13.4 oz)    SpO2:  100% 100% 100%    Intake/Output Summary (Last 24 hours) at 10/04/11 0831 Last data filed at 10/04/11 1610  Gross per 24 hour  Intake  780.5 ml  Output    800 ml  Net  -19.5 ml    Weight change: -0.327 kg (-11.5 oz)  Physical exam: Gen.: Pleasant obese 71 year old Caucasian woman sitting up in, in no acute distress. Lungs: Clear to auscultation bilaterally. Heart: S1, S2, with soft systolic murmur. Abdomen: Morbidly obese, positive bowel sounds, soft, mildly diffusely tender, no rigidity, nondistended. Extremities: Trace of chronic pedal edema. Edematous right hand with no erythema or warmth. Mildly tender.  Lab Results: Basic Metabolic Panel:  Basename 10/03/11 0434 10/02/11 0824  NA 141 139  K 3.7 4.1  CL 101 99  CO2 33* 33*  GLUCOSE 129* 137*  BUN 7 11  CREATININE 0.85 0.83  CALCIUM 9.5 9.1  MG -- --  PHOS -- --   Liver Function Tests:  Basename 10/02/11 0824 10/01/11 0910  AST 26 36  ALT 13 18  ALKPHOS 82 88  BILITOT 0.8 0.4  PROT 6.5 7.2  ALBUMIN 3.4* 3.6   No results found for this basename: LIPASE:2,AMYLASE:2 in the last 72 hours No results found for this basename: AMMONIA:2 in the last 72 hours CBC:  Basename 10/04/11 0525 10/03/11 0434  WBC 5.6 7.3  NEUTROABS -- --  HGB 10.5* 11.0*  HCT 33.2* 34.2*  MCV 84.7 82.6  PLT 150 184   Cardiac Enzymes:  Basename 10/01/11 2316 10/01/11 1521 10/01/11 0910  CKTOTAL 213* 162 118  CKMB 10.0* 9.7* 3.9  CKMBINDEX -- -- --  TROPONINI  1.86* 1.31* 0.35*   BNP:  Basename 10/02/11 0830  PROBNP 2360.0*   D-Dimer: No results found for this basename: DDIMER:2 in the last 72 hours CBG: No results found for this basename: GLUCAP:6 in the last 72 hours Hemoglobin A1C:  Basename 10/03/11 0500  HGBA1C 5.5   Fasting Lipid Panel:  Basename 10/02/11 0826  CHOL 200  HDL 47  LDLCALC 131*  TRIG 111  CHOLHDL 4.3  LDLDIRECT --   Thyroid Function Tests:  Basename 10/02/11 0830  TSH 2.859  T4TOTAL --  FREET4 --  T3FREE --  THYROIDAB --   Anemia Panel: No results found for this basename: VITAMINB12,FOLATE,FERRITIN,TIBC,IRON,RETICCTPCT in the last 72 hours Coagulation:  Basename 10/01/11 0910  LABPROT 16.7*  INR 1.33   Urine Drug Screen: Drugs of Abuse  No results found for this basename: labopia,  cocainscrnur,  labbenz,  amphetmu,  thcu,  labbarb    Alcohol Level: No results found for this basename: ETH:2 in the last 72 hours Urinalysis:  Basename 10/01/11 1425  COLORURINE YELLOW  LABSPEC 1.015  PHURINE 6.5  GLUCOSEU 100*  HGBUR TRACE*  BILIRUBINUR NEGATIVE  KETONESUR NEGATIVE  PROTEINUR NEGATIVE  UROBILINOGEN 0.2  NITRITE NEGATIVE  LEUKOCYTESUR NEGATIVE  Misc. Labs:  EKG October 02, 2011: Normal sinus rhythm with sinus arrhythmia, heart rate 68 beats per minute, Q waves in the inferior leads. EKG January 31 2012: Normal sinus rhythm, heart rate 82 beats per minute, Q waves in the inferior leads.   Micro: Recent Results (from the past 240 hour(s))  MRSA PCR SCREENING     Status: Normal   Collection Time   10/01/11  8:43 AM      Component Value Range Status Comment   MRSA by PCR NEGATIVE  NEGATIVE  Final     Studies/Results: Dg Chest Port 1 View  10/03/2011  *RADIOLOGY REPORT*  Clinical Data: Wheezing  PORTABLE CHEST - 1 VIEW  Comparison: 08/21/2011  Findings: Numerous leads and wires project over the chest.  Midline trachea.  Borderline cardiomegaly.  Minimal right hemidiaphragm elevation. No  pleural effusion or pneumothorax. Mildly low lung volumes. Clear lungs.  IMPRESSION: Borderline cardiomegaly and low lung volumes. No acute findings.  Original Report Authenticated By: Consuello Bossier, M.D.    Medications: I have reviewed the patient's current medications.  Assessment: Principal Problem:  *GI bleed Active Problems:  Acute blood loss anemia  History of atrial fibrillation  Obesity  Benign hypertension  Anxiety disorder  Peripheral edema  Asthma  Bilateral arm pain  Atrial fibrillation  Azotemia  AVM (arteriovenous malformation) of colon  Diverticulosis  MI, acute, non ST segment elevation  Hyperglycemia    1. Recurrent GI bleeding in the setting of Xarelto treatment. No further bleeding since admission. She is on IV Protonix. Her EGD in March revealed pyloric channel erosions and her colonoscopy revealed colonic diverticulosis and multiple cecal AVMs, status post APC thermal sealing by Dr. Jena Gauss. Per Dr. Luvenia Starch comments  then, if she rebled in the future from cecal AVMs, consider angiographic embolization versus right hemicolectomy. Endoscopy/colonoscopy was postponed 2 days ago  due to elevated cardiac enzymes. Colonoscopy was attempted yesterday but she was poorly prepped. The plan is for the colonoscopy today.   2. Acute blood loss anemia. She is status post a total of 4 units of packed red blood cell transfusions. Her hemoglobin has improved appropriately. She also received 2 units of fresh frozen plasma.  3. Elevated cardiac enzymes, consistent with non-ST elevation myocardial infarction. Dr. Marvel Plan assessment is noted and appreciated. Heparin/Lovenox and aspirin are currently contraindicated in the setting of an active GI bleed. She is already on a beta blocker. It has been titrated up to 75 mg twice a day by cardiology. Nitroglycerin paste was discontinued.  2-D echocardiogram revealed an ejection fraction of 60-65% and no regional wall motion  abnormalities.  4. HTN. Her blood pressure has trended up. The Toprol was titrated up. Diltiazem was restarted. Will restart lisinopril.  5. Chronic atrial fibrillation. She is intermittently in normal sinus rhythm. Apparently she had an ablation procedure in [place] a couple of months ago. She is on Xarelto chronically. This is currently being held.  6. Chronic peripheral/lower extremity edema. She was given Lasix between transfusions. She has a puffy edematous right hand, likely secondary to mild infiltration from previous IV.  7. Mild hyperglycemia. Her A1c is 5.5.  8. Hyperlipidemia. Statin started.  Plan:  1. PICC line for better access. 2. Colonoscopy retry today. 3. Warm compress to right hand. 4. Restart home dose of lisinopril.   LOS: 3 days   Binh Doten 10/04/2011, 8:31 AM

## 2011-10-04 NOTE — Progress Notes (Signed)
Evaluated patient this morning as she is set for re-attempt at colonoscopy. Prep poor yesterday. Concerns about IV access. Currently as 22 gauge in finger. Discussed with Dr. Jena Gauss and Dr. Sherrie Mustache. Plan for double-lumen PICC. Team notified and present now.

## 2011-10-04 NOTE — Progress Notes (Signed)
Troponin 0.61,Dr Fisher notified,no orders received.Will continue to monitor patient.

## 2011-10-04 NOTE — Progress Notes (Signed)
Sharon Compton  71 y.o.  female  Subjective: Patient developed chest discomfort, dyspnea, tachycardia and hypertension prior to planned colonoscopy. EKG showed ST segment depression in leads 1 and L that subsequently resolved as heart rate and blood pressure improved.  Patient reports fatigue but no other problem at present.  Allergy: Sulfa antibiotics and Codeine  Objective: Vital signs in last 24 hours: Temp:  [97.8 F (36.6 C)-98.7 F (37.1 C)] 97.8 F (36.6 C) (04/11 1651) Pulse Rate:  [63-81] 63  (04/11 0606) Resp:  [13-25] 23  (04/11 1510) BP: (92-206)/(48-132) 162/86 mmHg (04/11 1510) SpO2:  [94 %-100 %] 97 % (04/11 1510) Weight:  [90.719 kg (200 lb)-91.1 kg (200 lb 13.4 oz)] 90.719 kg (200 lb) (04/11 1357)  90.719 kg (200 lb) Body mass index is 39.06 kg/(m^2).  Weight change: -0.327 kg (-11.5 oz) Last BM Date: 10/04/11  Intake/Output from previous day: 04/10 0701 - 04/11 0700 In: 780.5 [P.O.:150; I.V.:622.5; IV Piggyback:8] Out: 800 [Urine:800] Weight increased 15 pounds since admission, but I&O shows no significant gain or loss of fluid  General- Well developed; no acute distress  Neck- No JVD, no carotid bruits Lungs- bibasilar rales; normal I:E ratio Cardiovascular- normal PMI; normal S1 and S2; grade 2/6 basilar systolic ejection murmur Abdomen- normal bowel sounds; soft and non-tender without masses or organomegaly Skin- Warm, no significant lesions Extremities- Nl distal pulses; no edema  Lab Results: Cardiac Markers:   Basename 10/04/11 1527 10/01/11 2316  TROPONINI 0.61* 1.86*   CBC:   Basename 10/04/11 0525 10/03/11 0434  WBC 5.6 7.3  HGB 10.5* 11.0*  HCT 33.2* 34.2*  PLT 150 184   BMET:  Basename 10/03/11 0434 10/02/11 0824  NA 141 139  K 3.7 4.1  CL 101 99  CO2 33* 33*  GLUCOSE 129* 137*  BUN 7 11  CREATININE 0.85 0.83  CALCIUM 9.5 9.1   Hepatic Function:   Basename 10/02/11 0824  PROT 6.5  ALBUMIN 3.4*  AST 26  ALT 13  ALKPHOS 82    BILITOT 0.8  BILIDIR --  IBILI --   GFR:  Estimated Creatinine Clearance: 61.8 ml/min (by C-G formula based on Cr of 0.85). Lipids:   Basename 10/02/11 0826  CHOL 200  TRIG 111  HDL 47   Imaging Studies/Results: Dg Chest Port 1 View  10/04/2011  *RADIOLOGY REPORT*  Clinical Data: Right PICC line placement.  PORTABLE CHEST - 1 VIEW  Comparison: 10/03/2011  Findings: Right PICC line tip is in the lower SVC.  Heart is upper limits normal in size.  Lungs are clear.  No effusions or acute bony abnormality.  IMPRESSION: Right PICC line tip in the lower SVC.  No active disease.  Original Report Authenticated By: Cyndie Chime, M.D.   Dg Chest Port 1 View  10/03/2011  *RADIOLOGY REPORT*  Clinical Data: Wheezing  PORTABLE CHEST - 1 VIEW  Comparison: 08/21/2011  Findings: Numerous leads and wires project over the chest.  Midline trachea.  Borderline cardiomegaly.  Minimal right hemidiaphragm elevation. No pleural effusion or pneumothorax. Mildly low lung volumes. Clear lungs.  IMPRESSION: Borderline cardiomegaly and low lung volumes. No acute findings.  Original Report Authenticated By: Consuello Bossier, M.D.    Imaging: Imaging results have been reviewed  Medications:  I have reviewed the patient's current medications. Scheduled:   . sodium chloride   Intravenous Once  . atorvastatin  20 mg Oral q1800  . diltiazem  120 mg Oral Daily  . lisinopril  10  mg Oral Daily  . LORazepam  0.5 mg Oral QHS  . meperidine      . metoprolol tartrate  75 mg Oral TID  . midazolam      . pantoprazole (PROTONIX) IV  40 mg Intravenous Q12H    Assessment/Plan: Chest pain: Patient reports that symptoms today were similar to those occurring in episodes that have been considered panic attacks in the past. EKG changes are of concern, but could reflect worsening of repolarization abnormalities with a change in heart rate. We will proceed with a pharmacologic stress nuclear study in the morning to exclude high risk  disease. I would prefer to avoid cardiac catheterization and possible invasive or interventional procedures in the setting of a recent lower GI bleed.  Beta blocker dose advanced to better control tachycardia and hypertension.  Marked apparent weight gain may reflect inaccuracy of the initial value.  Chest x-ray yesterday shows no ulnar edema or other radiographic signs of CHF. A BNP level will be checked.  LOS: 3 days   [place] Bing 10/04/2011, 6:10 PM

## 2011-10-04 NOTE — Progress Notes (Signed)
Dr. Jena Gauss called to inform me that the patient was complaining of 8/10 retrosternal chest pain radiating to both arms in the preoperative area prior to the colonoscopy. An EKG was obtained. It reveals new ST depressions in the lateral leads. The patient was given one supplement nitroglycerin which decreased her pain. Currently, the patient has no complaints of chest pain. I have notified cardiologist, Dr. Dietrich Pates of the findings.[name] He will see her this afternoon. We'll restart nitroglycerin paste and await cardiology's evaluation and recommendation.

## 2011-10-05 ENCOUNTER — Inpatient Hospital Stay (HOSPITAL_COMMUNITY): Payer: Medicare Other

## 2011-10-05 ENCOUNTER — Encounter (HOSPITAL_COMMUNITY): Payer: Self-pay | Admitting: Internal Medicine

## 2011-10-05 ENCOUNTER — Encounter (HOSPITAL_COMMUNITY): Payer: Self-pay | Admitting: Cardiology

## 2011-10-05 DIAGNOSIS — K625 Hemorrhage of anus and rectum: Secondary | ICD-10-CM

## 2011-10-05 DIAGNOSIS — R079 Chest pain, unspecified: Secondary | ICD-10-CM

## 2011-10-05 LAB — TYPE AND SCREEN
ABO/RH(D): B POS
Antibody Screen: NEGATIVE
Unit division: 0
Unit division: 0

## 2011-10-05 LAB — CARDIAC PANEL(CRET KIN+CKTOT+MB+TROPI): CK, MB: 2 ng/mL (ref 0.3–4.0)

## 2011-10-05 MED ORDER — TECHNETIUM TC 99M TETROFOSMIN IV KIT
30.0000 | PACK | Freq: Once | INTRAVENOUS | Status: AC | PRN
  Administered 2011-10-05: 30 via INTRAVENOUS

## 2011-10-05 MED ORDER — REGADENOSON 0.4 MG/5ML IV SOLN
INTRAVENOUS | Status: AC
  Administered 2011-10-05: 0.4 mg via INTRAVENOUS
  Filled 2011-10-05: qty 5

## 2011-10-05 MED ORDER — TECHNETIUM TC 99M TETROFOSMIN IV KIT
10.0000 | PACK | Freq: Once | INTRAVENOUS | Status: AC | PRN
  Administered 2011-10-05: 9.7 via INTRAVENOUS

## 2011-10-05 MED ORDER — GABAPENTIN 300 MG PO CAPS
300.0000 mg | ORAL_CAPSULE | Freq: Three times a day (TID) | ORAL | Status: DC
  Administered 2011-10-06: 300 mg via ORAL
  Filled 2011-10-05 (×3): qty 1

## 2011-10-05 MED ORDER — SODIUM CHLORIDE 0.9 % IJ SOLN
INTRAMUSCULAR | Status: AC
  Administered 2011-10-05: 10 mL via INTRAVENOUS
  Filled 2011-10-05: qty 10

## 2011-10-05 NOTE — Progress Notes (Signed)
Sharon Compton  71 y.o.  female  Subjective: No chest pain or dyspnea today  Telemetry:  Normal sinus rhythm, sinus bradycardia and sinus tachycardia  Allergy: Sulfa antibiotics and Codeine  Objective: Vital signs in last 24 hours: Temp:  [98.4 F (36.9 C)-98.9 F (37.2 C)] 98.5 F (36.9 C) (04/12 1600) Pulse Rate:  [53-76] 57  (04/12 1500) Resp:  [12-21] 15  (04/12 1500) BP: (82-150)/(37-84) 92/49 mmHg (04/12 1500) SpO2:  [89 %-100 %] 98 % (04/12 1500) Weight:  [90.9 kg (200 lb 6.4 oz)] 90.9 kg (200 lb 6.4 oz) (04/12 0500)  90.9 kg (200 lb 6.4 oz) Body mass index is 39.14 kg/(m^2).  Weight change: -0.454 kg (-1 lb) Last BM Date: 10/05/11  Intake/Output from previous day: 04/11 0701 - 04/12 0700 In: 383.3 [I.V.:381.3; IV Piggyback:2] Out: 350 [Urine:350]  General- Well developed; no acute distress  Neck- No JVD, no carotid bruits Lungs- clear lung fields; normal I:E ratio Cardiovascular- normal PMI; normal S1 and S2; grade 2/6 basilar systolic ejection murmur  Abdomen- normal bowel sounds; soft and non-tender without masses or organomegaly Skin- Warm, no significant lesions Extremities- Nl distal pulses; 1-2+ pedal edema  Lab Results: Cardiac Markers:   Basename 10/05/11 0823 10/04/11 2016  TROPONINI 0.79* 0.73*   CBC:   Basename 10/04/11 0525 10/03/11 0434  WBC 5.6 7.3  HGB 10.5* 11.0*  HCT 33.2* 34.2*  PLT 150 184   BMET:  Basename 10/03/11 0434  NA 141  K 3.7  CL 101  CO2 33*  GLUCOSE 129*  BUN 7  CREATININE 0.85  CALCIUM 9.5   Imaging Studies/Results: Dg Chest Port 1 View  10/04/2011  *RADIOLOGY REPORT*  Clinical Data: Right PICC line placement.  PORTABLE CHEST - 1 VIEW  Comparison: 10/03/2011  Findings: Right PICC line tip is in the lower SVC.  Heart is upper limits normal in size.  Lungs are clear.  No effusions or acute bony abnormality.  IMPRESSION: Right PICC line tip in the lower SVC.  No active disease.  Original Report Authenticated By: Cyndie Chime, M.D.   Imaging: Imaging results have been reviewed.  A pharmacologic stress nuclear study revealed stress-induced EKG abnormalities, suboptimal imaging as the result of the patient maintaining her arms at her sides, normal left ventricular size, mild to moderate breast attenuation, a minimal focal defect in the distal septum and a moderate-sized area of mildly to moderately decreased tracer uptake in the inferior and inferolateral region. No reversibility was apparent.  This is an equivocal study with a probably insignificant septal defect, but inferior and inferolateral abnormalities that could reflect nontransmural scarring or breast attenuation. While there is no evidence for ischemia, severe ischemia of the inferolateral region cannot be unequivocally excluded. Normal left ventricular systolic function and the absence of demonstrable ischemia are good prognostic signs.  Medications: I have reviewed the patient's current medications.   Assessment/Plan: Patient is eager for discharge and not interested in additional testing. She is fatalistic and suggests that if it's her time to go, she is ready. Although cardiac function is excellent, we have unexplained episodes with possible myocardial ischemia , chronically elevated cardiac markers and an abnormal stress nuclear study. A cardiomyopathic process is possible as is ischemic heart disease. I recommended cardiac catheterization to better evaluate for these possibilities. Patient notes that her first husband died after catheterization and that she is not interested in pursuing that option. Medical therapy and continuing observation as an outpatient is a reasonable course. She  will not be able to resume anticoagulation. She will return to the care of her cardiologist at [facility] after discharge.  Prolonged discussion with the patient lasting 30 minutes. Procedures involved in cardiac catheterization were described to her at length. Options for  treatment, possible diagnoses and possible future issues were discussed. She can likely be discharged this weekend.   LOS: 4 days   Stewart Bing 10/05/2011, 4:58 PM   z

## 2011-10-05 NOTE — Progress Notes (Addendum)
Subjective:  Feels worn out. No more bloody stools. States she had a salad and sandwich last night. Someone changed her to heart healthy diet. NPO for stress nuclear study today.   Objective: Vital signs in last 24 hours: Temp:  [97.8 F (36.6 C)-98.9 F (37.2 C)] 98.9 F (37.2 C) (04/12 0400) Pulse Rate:  [53-87] 60  (04/12 0800) Resp:  [12-25] 17  (04/12 0800) BP: (82-206)/(37-102) 105/44 mmHg (04/12 0800) SpO2:  [89 %-100 %] 96 % (04/12 0800) Weight:  [200 lb (90.719 kg)-200 lb 6.4 oz (90.9 kg)] 200 lb 6.4 oz (90.9 kg) (04/12 0500) Last BM Date: 10/04/11 General:   Alert,  Well-developed, well-nourished, pleasant and cooperative in NAD Head:  Normocephalic and atraumatic. Eyes:  Sclera clear, no icterus.  Chest: CTA bilaterally without rales, rhonchi, crackles.    Heart:  Regular rate and rhythm; no murmurs, clicks, rubs,  or gallops. Abdomen:  Soft, nontender and nondistended.  Normal bowel sounds, without guarding, and without rebound.   Extremities:  Without clubbing, deformity or edema. Neurologic:  Alert and  oriented x4;  grossly normal neurologically. Skin:  Intact without significant lesions or rashes. Psych:  Alert and cooperative. Normal mood and affect.  Intake/Output from previous day: 04/11 0701 - 04/12 0700 In: 383.3 [I.V.:381.3; IV Piggyback:2] Out: 350 [Urine:350] Intake/Output this shift: Total I/O In: 10 [I.V.:10] Out: -   Lab Results: CBC  Basename 10/04/11 0525 10/03/11 0434  WBC 5.6 7.3  HGB 10.5* 11.0*  HCT 33.2* 34.2*  MCV 84.7 82.6  PLT 150 184   BMET  Basename 10/03/11 0434  NA 141  K 3.7  CL 101  CO2 33*  GLUCOSE 129*  BUN 7  CREATININE 0.85  CALCIUM 9.5   LFTs No results found for this basename: BILITOT:3,BILIDIR:3,IBILI:3,ALKPHOS:3,AST:3,ALT:3,PROT:3,ALBUMIN:3 in the last 72 hours No results found for this basename: LIPASE:3 in the last 72 hours PT/INR No results found for this basename: LABPROT:3,INR:3 in the last 72 hours      Assessment: Rectal bleeding. Still has not completed colonoscopy. First time due to poor prep. Yesterday, patient had chest pain right before procedure so it was cancelled. 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 For stress nuclear test today. Denies further GI bleeding. Unfortunately she was given solid food last night.   Plan: 1. Clear liquids to resume after cardiac testing until decision can be made regarding if colonoscopy is going to be done this admission.   LOS: 4 days   Tana Coast  10/05/2011, 9:08 AM

## 2011-10-05 NOTE — Progress Notes (Signed)
Stress Lab Nurses Notes - Sharon Compton  Sharon Compton 10/05/2011 Reason for doing test: Chest Pain Type of test: Lexiscan Myoview inpatient rm ICU 03 Nurse performing test: Parke Poisson, RN Nuclear Medicine Tech: Lyndel Pleasure Echo Tech: Not Applicable MD performing test: R. Rothbart & Joni Reining NP Family MD:  Test explained and consent signed: yes IV started: Pic line flushed & lexiscan given Symptoms: "feeling funny" & headache Treatment/Intervention: None Reason test stopped: protocol completed After recovery IV was: No redness or edema and pic line flushed Patient to return to Nuc. Med at : 12:00 Patient discharged: Transported back to room ICU 03 via wc Patient's Condition upon discharge was: stable Comments: During test BP 138/62 & HR 96.  Recovery BP 142/68 & HR 90.  Headache continue, caffine given. [name] T

## 2011-10-05 NOTE — Progress Notes (Addendum)
Chart reviewed.  Subjective: "Worn out." No further bleeding. No further chest pain.  Objective: Vital signs in last 24 hours: Filed Vitals:   10/05/11 0400 10/05/11 0500 10/05/11 0700 10/05/11 0800  BP: 97/50 82/37 105/52 105/44  Pulse: 54 56 57 60  Temp: 98.9 F (37.2 C)     TempSrc: Oral     Resp: 16 16 18 17   Height:      Weight:  90.9 kg (200 lb 6.4 oz)    SpO2: 99% 99% 98% 96%   Weight change: -0.454 kg (-1 lb)  Intake/Output Summary (Last 24 hours) at 10/05/11 0834 Last data filed at 10/05/11 0800  Gross per 24 hour  Intake 393.33 ml  Output    350 ml  Net  43.33 ml   Physical Exam:  General: Comfortable. Lying flat. Lungs clear to auscultation bilaterally without wheeze rhonchi or rales Cardiovascular regular rate rhythm without murmurs gallops rubs Abdomen obese soft nontender Extremities Multiple ecchymoses. Legs without pitting edema. Hyperesthesia  Lab Results: Basic Metabolic Panel:  Lab 10/03/11 1610 10/02/11 0824  NA 141 139  K 3.7 4.1  CL 101 99  CO2 33* 33*  GLUCOSE 129* 137*  BUN 7 11  CREATININE 0.85 0.83  CALCIUM 9.5 9.1  MG -- --  PHOS -- --   Liver Function Tests:  Lab 10/02/11 0824 10/01/11 0910  AST 26 36  ALT 13 18  ALKPHOS 82 88  BILITOT 0.8 0.4  PROT 6.5 7.2  ALBUMIN 3.4* 3.6   No results found for this basename: LIPASE:2,AMYLASE:2 in the last 168 hours No results found for this basename: AMMONIA:2 in the last 168 hours CBC:  Lab 10/04/11 0525 10/03/11 0434  WBC 5.6 7.3  NEUTROABS -- --  HGB 10.5* 11.0*  HCT 33.2* 34.2*  MCV 84.7 82.6  PLT 150 184   Cardiac Enzymes:  Lab 10/04/11 2016 10/04/11 1527 10/01/11 2316  CKTOTAL 87 91 213*  CKMB 3.0 2.9 10.0*  CKMBINDEX -- -- --  TROPONINI 0.73* 0.61* 1.86*   BNP:  Lab 10/05/11 0450 10/02/11 0830  PROBNP 2440.0* 2360.0*   D-Dimer: No results found for this basename: DDIMER:2 in the last 168 hours CBG: No results found for this basename: GLUCAP:6 in the last  168 hours Hemoglobin A1C:  Lab 10/03/11 0500  HGBA1C 5.5   Fasting Lipid Panel:  Lab 10/02/11 0826  CHOL 200  HDL 47  LDLCALC 131*  TRIG 111  CHOLHDL 4.3  LDLDIRECT --   Thyroid Function Tests:  Lab 10/02/11 0830  TSH 2.859  T4TOTAL --  FREET4 --  T3FREE --  THYROIDAB --   Coagulation:  Lab 10/01/11 0910  LABPROT 16.7*  INR 1.33   Anemia Panel: No results found for this basename: VITAMINB12,FOLATE,FERRITIN,TIBC,IRON,RETICCTPCT in the last 168 hours Urine Drug Screen: Drugs of Abuse  No results found for this basename: labopia, cocainscrnur, labbenz, amphetmu, thcu, labbarb    Alcohol Level: No results found for this basename: ETH:2 in the last 168 hours Urinalysis:  Lab 10/01/11 1425  COLORURINE YELLOW  LABSPEC 1.015  PHURINE 6.5  GLUCOSEU 100*  HGBUR TRACE*  BILIRUBINUR NEGATIVE  KETONESUR NEGATIVE  PROTEINUR NEGATIVE  UROBILINOGEN 0.2  NITRITE NEGATIVE  LEUKOCYTESUR NEGATIVE   Micro Results: Recent Results (from the past 240 hour(s))  MRSA PCR SCREENING     Status: Normal   Collection Time   10/01/11  8:43 AM      Component Value Range Status Comment   MRSA by PCR NEGATIVE  NEGATIVE  Final    Studies/Results: Dg Chest Port 1 View  10/04/2011  *RADIOLOGY REPORT*  Clinical Data: Right PICC line placement.  PORTABLE CHEST - 1 VIEW  Comparison: 10/03/2011  Findings: Right PICC line tip is in the lower SVC.  Heart is upper limits normal in size.  Lungs are clear.  No effusions or acute bony abnormality.  IMPRESSION: Right PICC line tip in the lower SVC.  No active disease.  Original Report Authenticated By: Cyndie Chime, M.D.   Dg Chest Port 1 View  10/03/2011  *RADIOLOGY REPORT*  Clinical Data: Wheezing  PORTABLE CHEST - 1 VIEW  Comparison: 08/21/2011  Findings: Numerous leads and wires project over the chest.  Midline trachea.  Borderline cardiomegaly.  Minimal right hemidiaphragm elevation. No pleural effusion or pneumothorax. Mildly low lung  volumes. Clear lungs.  IMPRESSION: Borderline cardiomegaly and low lung volumes. No acute findings.  Original Report Authenticated By: Consuello Bossier, M.D.   Scheduled Meds:   . sodium chloride   Intravenous Once  . atorvastatin  20 mg Oral q1800  . diltiazem  120 mg Oral Daily  . lisinopril  10 mg Oral Daily  . LORazepam  0.5 mg Oral QHS  . metoprolol tartrate  75 mg Oral TID  . nitroGLYCERIN  0.5 inch Topical Q6H  . nitroGLYCERIN      . nitroGLYCERIN  0.4 mg Sublingual Once  . nitroGLYCERIN  0.4 mg Sublingual Once  . pantoprazole (PROTONIX) IV  40 mg Intravenous Q12H  . promethazine      . promethazine  25 mg Intravenous Once  . sodium chloride  10-40 mL Intracatheter Q12H  . sodium chloride      . sodium chloride      . DISCONTD: metoprolol tartrate  75 mg Oral BID  . DISCONTD: ramipril  5 mg Oral Daily   Continuous Infusions:   . 0.9 % NaCl with KCl 20 mEq / L 10 mL/hr (10/05/11 0609)   PRN Meds:.acetaminophen, acetaminophen, ALPRAZolam, HYDROmorphone, levalbuterol, nitroGLYCERIN, ondansetron (ZOFRAN) IV, sodium chloride Assessment/Plan: Principal Problem:  *GI bleed Active Problems:  Acute blood loss anemia  MI, acute, non ST segment elevation  Benign hypertension  Atrial fibrillation  AVM (arteriovenous malformation) of colon  Diverticulosis  Hyperglycemia  Obesity  Anxiety disorder  Peripheral edema  Asthma  No further bleeding. For stress test today. Blood pressure measurements low overnight. hold lisinopril today. Continue metoprolol and nitroglycerin.   LOS: 4 days   Treyvon Blahut L 10/05/2011, 8:34 AM

## 2011-10-06 LAB — BASIC METABOLIC PANEL
CO2: 34 mEq/L — ABNORMAL HIGH (ref 19–32)
Calcium: 8.8 mg/dL (ref 8.4–10.5)
Chloride: 101 mEq/L (ref 96–112)
Creatinine, Ser: 0.85 mg/dL (ref 0.50–1.10)
Glucose, Bld: 109 mg/dL — ABNORMAL HIGH (ref 70–99)

## 2011-10-06 LAB — CBC
HCT: 31.3 % — ABNORMAL LOW (ref 36.0–46.0)
Hemoglobin: 9.8 g/dL — ABNORMAL LOW (ref 12.0–15.0)
MCH: 26.9 pg (ref 26.0–34.0)
MCV: 86 fL (ref 78.0–100.0)
Platelets: 129 10*3/uL — ABNORMAL LOW (ref 150–400)
RBC: 3.64 MIL/uL — ABNORMAL LOW (ref 3.87–5.11)
WBC: 5 10*3/uL (ref 4.0–10.5)

## 2011-10-06 MED ORDER — NITROGLYCERIN 0.4 MG SL SUBL: 0.4000 mg | SUBLINGUAL_TABLET | SUBLINGUAL | Status: DC | PRN

## 2011-10-06 NOTE — Progress Notes (Signed)
DISCHARGE INSTRUCTIONS GIVEN. PT HAS GOOD UNDERSTANDING.RX FOR NTG SL GIVEN TO PT. SECURITY GUARD BROUGHT PT HER 5 RINGS FOR THE VAULT AND SHE SIGNED FOR RECEIVING THEM. PICC LINE HAS BEEN REMOVED BY Worthy Rancher GERALD RN/AC W/O DIFFICULTY. PT HAS BEEN UP WALKING 30 YARDS W/ NO CP BUT SOME SOB.[name]

## 2011-10-06 NOTE — Plan of Care (Signed)
Problem: Phase II Progression Outcomes Goal: Hemodynamically stable Outcome: Progressing Blood pressure running low tonight and HR was 46-51 bpm. Beta blocker held tonight Goal: Progress activity as tolerated unless otherwise ordered Outcome: Progressing OOB to bsc with minimal assistance Goal: Tolerating diet Outcome: Progressing Clear liquids tonight

## 2011-10-06 NOTE — Progress Notes (Signed)
Metoprolol 75mg    held at 2200 10/05/11  due to bradycardia in the 40's and BP 94/41.

## 2011-10-07 NOTE — Discharge Summary (Signed)
Physician Discharge Summary  Patient ID: Sharon Compton MRN: [id number] DOB/AGE: 03-17-1941 71 y.o.  Admit date: 10/01/2011 Discharge date: 10/07/2011  Discharge Diagnoses:  Principal Problem:  *GI bleed Active Problems:  Acute blood loss anemia  MI, acute, non ST segment elevation  Benign hypertension  Atrial fibrillation  AVM (arteriovenous malformation) of colon  Diverticulosis  Hyperglycemia  Obesity  Anxiety disorder  Peripheral edema  Asthma   Medication List  As of 10/07/2011  6:27 PM   STOP taking these medications         aspirin 81 MG chewable tablet      diltiazem 120 MG 24 hr capsule      lisinopril 10 MG tablet      Rivaroxaban 20 MG Tabs         TAKE these medications         albuterol 108 (90 BASE) MCG/ACT inhaler   Commonly known as: PROVENTIL HFA;VENTOLIN HFA   Inhale 2 puffs into the lungs 4 (four) times daily as needed. For shortness of breath      ALPRAZolam 1 MG tablet   Commonly known as: XANAX   Take 1 mg by mouth 4 (four) times daily as needed. Anxiety      fish oil-omega-3 fatty acids 1000 MG capsule   Take 1 g by mouth daily.      furosemide 20 MG tablet   Commonly known as: LASIX   Take 1 tablet (20 mg total) by mouth 2 (two) times daily.      gabapentin 300 MG capsule   Commonly known as: NEURONTIN   Take 300 mg by mouth 3 (three) times daily.      iron polysaccharides 150 MG capsule   Commonly known as: NIFEREX   Take 150 mg by mouth daily.      metoprolol 50 MG tablet   Commonly known as: LOPRESSOR   Take 50 mg by mouth 2 (two) times daily.      nitroGLYCERIN 0.4 MG SL tablet   Commonly known as: NITROSTAT   Place 1 tablet (0.4 mg total) under the tongue every 5 (five) minutes x 3 doses as needed for chest pain.      pantoprazole 40 MG tablet   Commonly known as: PROTONIX   Take 1 tablet (40 mg total) by mouth 2 (two) times daily before a meal.      Vitamin D 2000 UNITS tablet   Take 2,000 Units by mouth daily.            Discharge Orders    Future Orders Please Complete By Expires   Diet - low sodium heart healthy      Increase activity slowly         Follow-up Information    Follow up with your primary care provider. Schedule an appointment as soon as possible for a visit in 1 week.      Schedule an appointment as soon as possible for a visit with your cardiologist.      Follow up with Eula Listen, MD. (As needed if you decide you want to proceed with further testing)    Contact information:   [address] [place] [address] [phone]          Disposition: 01-Home or Self Care  Discharged Condition: stable  Consults: Treatment Team:  Kathlen Brunswick, MD Corbin Ade, MD  Labs:   Results for orders placed during the hospital encounter  of 10/01/11 (from the past 48 hour(s))  CBC     Status: Abnormal   Collection Time   10/06/11  5:12 AM      Component Value Range Comment   WBC 5.0  4.0 - 10.5 (K/uL)    RBC 3.64 (*) 3.87 - 5.11 (MIL/uL)    Hemoglobin 9.8 (*) 12.0 - 15.0 (g/dL)    HCT 78.2 (*) 95.6 - 46.0 (%)    MCV 86.0  78.0 - 100.0 (fL)    MCH 26.9  26.0 - 34.0 (pg)    MCHC 31.3  30.0 - 36.0 (g/dL)    RDW 21.3 (*) 08.6 - 15.5 (%)    Platelets 129 (*) 150 - 400 (K/uL)   BASIC METABOLIC PANEL     Status: Abnormal   Collection Time   10/06/11  5:12 AM      Component Value Range Comment   Sodium 140  135 - 145 (mEq/L)    Potassium 3.6  3.5 - 5.1 (mEq/L)    Chloride 101  96 - 112 (mEq/L)    CO2 34 (*) 19 - 32 (mEq/L)    Glucose, Bld 109 (*) 70 - 99 (mg/dL)    BUN 12  6 - 23 (mg/dL)    Creatinine, Ser 5.78  0.50 - 1.10 (mg/dL)    Calcium 8.8  8.4 - 10.5 (mg/dL)    GFR calc non Af Amer 68 (*) >90 (mL/min)    GFR calc Af Amer 79 (*) >90 (mL/min)     Diagnostics:  Nm Myocar Single W/spect W/wall Motion And Ef  10/06/2011  nm myoview pharmacologic stress  Ordering Physician: [place] Bing  Reading Physician: [place] Bing  Clinical Data: 71 year old woman with chest pain and EKG abnormalities during psychologic stress.  NUCLEAR MEDICINE ADENOSINE STRESS MYOVIEW STUDY WITH SPECT AND LEFT VENTRIUCLAR EJECTION FRACTION  Radionuclide Data: One-day rest/stress protocol performed with 10/30 mCi of Tc-77m Myoview.  Stress Data: Regadenoson infusion resulted in a moderate increase in heart rate with no significant change in blood pressure. Chest discomfort developed following drug administration. No arrhythmias occurred.  EKG: Normal sinus rhythm, nondiagnostic inferior Q-waves, nonspecific ST-T wave abnormality.  With pharmacologic stress, 1 mm of downsloping ST-segment depression appeared in leads I and avL with slight ST-segment elevation in lead III.   This resolved at 3 minutes into recovery.  Scintigraphic Data: Acquisition performed with arms down.  Mild to moderate breast attenuation was noted.  Left ventricular size was normal.  On tomographic images reconstructed in standard planes, there was a mild and minimally sized focal defect in the distal septum for which no reversibility was apparent.  A moderate-sized inferolateral defect of mild to moderate intensity was also present, also without reversibility.  The numeric analysis indicated mild transient ischemic dilatation, but this was not apparent by visual inspection.  The gated reconstruction demonstrated normal to hyperdynamic regional and global left ventricular systolic function as well as normal systolic accentuation of activity throughout.  Estimated ejection fraction exceeded 65%.  IMPRESSION: Abnormal pharmacologic stress nuclear study revealing significant stress-induced EKG abnormalities, normal left ventricular size and normal left ventricular systolic function.  By scintigraphic imaging, there was a borderline distal septal defect of uncertain significance as well as a substantial persistent area of hypoperfusion inferolaterally.  While this could represent  breast attenuation, the possibility of scarring in this region must be considered.  Moreover, although there was no objective evidence for ischemia, severe ischemia could produce this pattern.  Normal wall motion  and systolic accentuation of activity in this region indicates substantial functional myocardium.  Original Report Authenticated By: Meliton Rattan Chest Port 1 View  10/04/2011  *RADIOLOGY REPORT*  Clinical Data: Right PICC line placement.  PORTABLE CHEST - 1 VIEW  Comparison: 10/03/2011  Findings: Right PICC line tip is in the lower SVC.  Heart is upper limits normal in size.  Lungs are clear.  No effusions or acute bony abnormality.  IMPRESSION: Right PICC line tip in the lower SVC.  No active disease.  Original Report Authenticated By: Cyndie Chime, M.D.   Dg Chest Port 1 View  10/03/2011  *RADIOLOGY REPORT*  Clinical Data: Wheezing  PORTABLE CHEST - 1 VIEW  Comparison: 08/21/2011  Findings: Numerous leads and wires project over the chest.  Midline trachea.  Borderline cardiomegaly.  Minimal right hemidiaphragm elevation. No pleural effusion or pneumothorax. Mildly low lung volumes. Clear lungs.  IMPRESSION: Borderline cardiomegaly and low lung volumes. No acute findings.  Original Report Authenticated By: Consuello Bossier, M.D.   Pharmacologic nuclear stress study:  A pharmacologic stress nuclear study revealed stress-induced EKG abnormalities, suboptimal imaging as the result of the patient maintaining her arms at her sides, normal left ventricular size, mild to moderate breast attenuation, a minimal focal defect in the distal septum and a moderate-sized area of mildly to moderately decreased tracer uptake in the inferior and inferolateral region. No reversibility was apparent. This is an equivocal study with a probably insignificant septal defect, but inferior and inferolateral abnormalities that could reflect nontransmural scarring or breast attenuation. While there is no evidence for  ischemia, severe ischemia of the inferolateral region cannot be unequivocally excluded. Normal left ventricular systolic function and the absence of demonstrable ischemia are good prognostic signs.   EKG: Normal sinus rhythm Left ventricular hypertrophy with repolarization abnormality  Normal sinus rhythm with sinus arrhythmia Possible Lateral infarct , age undetermined Possible Inferior infarct , age undetermined  Echocardiogram  - Left ventricle: The cavity size was normal. Mild LVH with disproportionate septal hypertrophy. Systolic function was normal. The estimated ejection fraction was in the range of 60% to 65%. Wall motion was normal; there were no regional wall motion abnormalities. - Aortic valve: Mildly calcified annulus. Trileaflet; normal thickness leaflets. Trivial regurgitation. - Atrial septum: No defect or patent foramen ovale was identified. - Pulmonary arteries: PA peak pressure: 39mm Hg (S). Transthoracic echocardiography. M-mode, complete 2D, spectral Doppler, and color Doppler. Height: Height: 154.9cm. Height: 61in. Weight: Weight: 88.5kg. Weight: 194.6lb. Body mass index: BMI: 36.8kg/m^2. Body surface area: BSA: 1.35m^2. Patient status: Inpatient. Location: ICU/CCU  Procedures: Attempted colonoscopy, inadequate preparation.   Hospital Course: See H&P for complete admission details. Ms. Rowlands is a 71 year old white female who presented with hematochezia. She was recently started on Xarelto for atrial fibrillation. She had had a GI bleed recently secondary to cecal AV malformations. She did not want to come to the emergency room, but finally presented because of dizziness and weakness. On presentation, her hemoglobin was 7. BUN was 24. Blood pressure was 145/70 with a heart rate of 94. She was pale and dry mucous membranes. Abdomen was obese, right anterior flank tenderness, maroon-colored stools.  She was given 4 units of FFP and 3 units of packed red blood  cells. GI was consulted. Her Xarelto was stopped. She had serial cardiac enzymes drawn for some chest discomfort arm pain and dyspnea. Her peak troponin was 1.86. Cardiology was consulted. Echocardiogram was done. Patient was subsequently cleared for procedure. She  had poor prep. Repeat was attempted, but she developed chest pain pre-procedure. Stress test could not unequivocally rule out ischemia. Catheterization was recommended, but patient declined. She also declined any further testing including colonoscopy. She voiced understanding of the risks of MI, continued bleeding, death. She was encouraged to come back to the emergency room should she have further symptoms, or change her mind about catheterization and colonoscopy. She should not resume anticoagulation. Total time of day of discharge greater than 30 minutes.  Discharge Exam:  Blood pressure 116/63, pulse 64, temperature 98.8 F (37.1 C), temperature source Oral, resp. rate 18, height 5' (1.524 m), weight 91.853 kg (202 lb 8 oz), SpO2 98.00%.  Unchanged from 10/06/2011   Signed: Crista Curb L 10/07/2011, 6:27 PM

## 2011-10-10 ENCOUNTER — Encounter (HOSPITAL_COMMUNITY): Payer: Self-pay | Admitting: Internal Medicine

## 2011-10-19 DIAGNOSIS — I10 Essential (primary) hypertension: Secondary | ICD-10-CM | POA: Diagnosis not present

## 2011-10-19 DIAGNOSIS — M25439 Effusion, unspecified wrist: Secondary | ICD-10-CM | POA: Diagnosis not present

## 2011-10-19 DIAGNOSIS — M25539 Pain in unspecified wrist: Secondary | ICD-10-CM | POA: Diagnosis not present

## 2011-10-19 DIAGNOSIS — Z79899 Other long term (current) drug therapy: Secondary | ICD-10-CM | POA: Diagnosis not present

## 2011-11-09 DIAGNOSIS — D539 Nutritional anemia, unspecified: Secondary | ICD-10-CM | POA: Diagnosis not present

## 2011-11-09 DIAGNOSIS — E039 Hypothyroidism, unspecified: Secondary | ICD-10-CM | POA: Diagnosis not present

## 2011-11-09 DIAGNOSIS — I872 Venous insufficiency (chronic) (peripheral): Secondary | ICD-10-CM | POA: Diagnosis not present

## 2011-11-15 ENCOUNTER — Other Ambulatory Visit: Payer: Self-pay | Admitting: Physician Assistant

## 2011-11-15 DIAGNOSIS — I890 Lymphedema, not elsewhere classified: Secondary | ICD-10-CM | POA: Diagnosis not present

## 2011-11-15 DIAGNOSIS — R609 Edema, unspecified: Secondary | ICD-10-CM | POA: Diagnosis not present

## 2011-11-15 DIAGNOSIS — E039 Hypothyroidism, unspecified: Secondary | ICD-10-CM | POA: Diagnosis not present

## 2011-12-12 DIAGNOSIS — R0789 Other chest pain: Secondary | ICD-10-CM | POA: Diagnosis not present

## 2011-12-12 DIAGNOSIS — Z8719 Personal history of other diseases of the digestive system: Secondary | ICD-10-CM | POA: Diagnosis not present

## 2011-12-12 DIAGNOSIS — G43909 Migraine, unspecified, not intractable, without status migrainosus: Secondary | ICD-10-CM | POA: Diagnosis not present

## 2011-12-12 DIAGNOSIS — I10 Essential (primary) hypertension: Secondary | ICD-10-CM | POA: Diagnosis not present

## 2011-12-12 DIAGNOSIS — R609 Edema, unspecified: Secondary | ICD-10-CM | POA: Diagnosis not present

## 2011-12-12 DIAGNOSIS — I4891 Unspecified atrial fibrillation: Secondary | ICD-10-CM | POA: Diagnosis not present

## 2011-12-13 DIAGNOSIS — I4892 Unspecified atrial flutter: Secondary | ICD-10-CM | POA: Diagnosis not present

## 2011-12-13 DIAGNOSIS — R609 Edema, unspecified: Secondary | ICD-10-CM | POA: Diagnosis not present

## 2011-12-13 DIAGNOSIS — R0602 Shortness of breath: Secondary | ICD-10-CM | POA: Diagnosis not present

## 2011-12-13 DIAGNOSIS — I509 Heart failure, unspecified: Secondary | ICD-10-CM | POA: Diagnosis not present

## 2011-12-18 DIAGNOSIS — R609 Edema, unspecified: Secondary | ICD-10-CM | POA: Diagnosis not present

## 2011-12-18 DIAGNOSIS — I059 Rheumatic mitral valve disease, unspecified: Secondary | ICD-10-CM | POA: Diagnosis not present

## 2011-12-18 DIAGNOSIS — I10 Essential (primary) hypertension: Secondary | ICD-10-CM | POA: Diagnosis not present

## 2011-12-18 DIAGNOSIS — I2789 Other specified pulmonary heart diseases: Secondary | ICD-10-CM | POA: Diagnosis not present

## 2011-12-18 DIAGNOSIS — I359 Nonrheumatic aortic valve disorder, unspecified: Secondary | ICD-10-CM | POA: Diagnosis not present

## 2011-12-18 DIAGNOSIS — I517 Cardiomegaly: Secondary | ICD-10-CM | POA: Diagnosis not present

## 2011-12-31 ENCOUNTER — Ambulatory Visit: Payer: Medicare Other | Admitting: Cardiology

## 2012-02-05 DIAGNOSIS — I10 Essential (primary) hypertension: Secondary | ICD-10-CM | POA: Diagnosis not present

## 2012-02-05 DIAGNOSIS — F411 Generalized anxiety disorder: Secondary | ICD-10-CM | POA: Diagnosis not present

## 2012-05-05 DIAGNOSIS — R0602 Shortness of breath: Secondary | ICD-10-CM | POA: Diagnosis not present

## 2012-05-05 DIAGNOSIS — I10 Essential (primary) hypertension: Secondary | ICD-10-CM | POA: Diagnosis not present

## 2012-05-05 DIAGNOSIS — I4891 Unspecified atrial fibrillation: Secondary | ICD-10-CM | POA: Diagnosis not present

## 2012-05-05 DIAGNOSIS — I509 Heart failure, unspecified: Secondary | ICD-10-CM | POA: Diagnosis not present

## 2012-05-05 DIAGNOSIS — D539 Nutritional anemia, unspecified: Secondary | ICD-10-CM | POA: Diagnosis not present

## 2012-05-12 DIAGNOSIS — R609 Edema, unspecified: Secondary | ICD-10-CM | POA: Diagnosis not present

## 2012-05-12 DIAGNOSIS — I509 Heart failure, unspecified: Secondary | ICD-10-CM | POA: Diagnosis not present

## 2012-05-29 DIAGNOSIS — Z1231 Encounter for screening mammogram for malignant neoplasm of breast: Secondary | ICD-10-CM | POA: Diagnosis not present

## 2012-07-15 DIAGNOSIS — I509 Heart failure, unspecified: Secondary | ICD-10-CM | POA: Diagnosis not present

## 2012-07-15 DIAGNOSIS — I10 Essential (primary) hypertension: Secondary | ICD-10-CM | POA: Diagnosis not present

## 2012-07-15 DIAGNOSIS — R05 Cough: Secondary | ICD-10-CM | POA: Diagnosis not present

## 2012-08-05 DIAGNOSIS — I10 Essential (primary) hypertension: Secondary | ICD-10-CM | POA: Diagnosis not present

## 2012-08-05 DIAGNOSIS — F411 Generalized anxiety disorder: Secondary | ICD-10-CM | POA: Diagnosis not present

## 2012-08-05 DIAGNOSIS — E039 Hypothyroidism, unspecified: Secondary | ICD-10-CM | POA: Diagnosis not present

## 2012-08-05 DIAGNOSIS — E785 Hyperlipidemia, unspecified: Secondary | ICD-10-CM | POA: Diagnosis not present

## 2012-08-05 DIAGNOSIS — J209 Acute bronchitis, unspecified: Secondary | ICD-10-CM | POA: Diagnosis not present

## 2012-09-02 DIAGNOSIS — E039 Hypothyroidism, unspecified: Secondary | ICD-10-CM | POA: Diagnosis not present

## 2012-09-02 DIAGNOSIS — I10 Essential (primary) hypertension: Secondary | ICD-10-CM | POA: Diagnosis not present

## 2012-09-02 DIAGNOSIS — I4891 Unspecified atrial fibrillation: Secondary | ICD-10-CM | POA: Diagnosis not present

## 2012-09-10 ENCOUNTER — Other Ambulatory Visit: Payer: Self-pay | Admitting: Physician Assistant

## 2012-09-10 DIAGNOSIS — E785 Hyperlipidemia, unspecified: Secondary | ICD-10-CM

## 2012-09-10 NOTE — Telephone Encounter (Signed)
Patient called stating that the medication Prazastatin 40mg . The directions are for her to take at bedtime. She is stating that the medication is making her "sick as a dog". It is making her dizzy, falling, disoriented, and nausea. Would like something else.

## 2012-09-10 NOTE — Telephone Encounter (Signed)
Please review

## 2012-09-11 NOTE — Addendum Note (Signed)
Addended by: Almeta Monas on: 09/11/2012 04:32 PM   Modules accepted: Orders

## 2012-09-11 NOTE — Telephone Encounter (Signed)
Have her try livalo 2mg  at bedtime

## 2012-09-11 NOTE — Telephone Encounter (Signed)
Pt willing to try Livalo 2mg .  Medication called to [facility], Pt aware.

## 2012-09-12 MED ORDER — PITAVASTATIN CALCIUM 2 MG PO TABS: 2.0000 mg | ORAL_TABLET | Freq: Every day | ORAL | Status: DC

## 2012-09-13 ENCOUNTER — Other Ambulatory Visit: Payer: Self-pay | Admitting: *Deleted

## 2012-09-13 NOTE — Telephone Encounter (Signed)
CHART NOTE SHOWS LASIX 20MG   2 PO IN AM AND 1 AT NOON. please REVIEW. THANKS.

## 2012-09-15 MED ORDER — FUROSEMIDE 20 MG PO TABS: 20.0000 mg | ORAL_TABLET | Freq: Every day | ORAL | Status: DC

## 2012-09-25 ENCOUNTER — Ambulatory Visit (INDEPENDENT_AMBULATORY_CARE_PROVIDER_SITE_OTHER): Payer: Medicare Other | Admitting: Family Medicine

## 2012-09-25 ENCOUNTER — Encounter: Payer: Self-pay | Admitting: Family Medicine

## 2012-09-25 VITALS — BP 167/81 | HR 55 | Temp 98.2°F | Ht 60.0 in | Wt 204.0 lb

## 2012-09-25 DIAGNOSIS — R609 Edema, unspecified: Secondary | ICD-10-CM | POA: Diagnosis not present

## 2012-09-25 DIAGNOSIS — R635 Abnormal weight gain: Secondary | ICD-10-CM

## 2012-09-25 DIAGNOSIS — F341 Dysthymic disorder: Secondary | ICD-10-CM

## 2012-09-25 DIAGNOSIS — I10 Essential (primary) hypertension: Secondary | ICD-10-CM | POA: Diagnosis not present

## 2012-09-25 DIAGNOSIS — F329 Major depressive disorder, single episode, unspecified: Secondary | ICD-10-CM

## 2012-09-25 LAB — CBC WITH DIFFERENTIAL/PLATELET
Basophils Relative: 0.6 % (ref 0.0–3.0)
Eosinophils Relative: 1.2 % (ref 0.0–5.0)
HCT: 39 % (ref 36.0–46.0)
Lymphs Abs: 2.9 10*3/uL (ref 0.7–4.0)
MCV: 80.4 fl (ref 78.0–100.0)
Monocytes Absolute: 0.4 10*3/uL (ref 0.1–1.0)
Monocytes Relative: 5.7 % (ref 3.0–12.0)
Neutrophils Relative %: 51.6 % (ref 43.0–77.0)
RBC: 4.85 Mil/uL (ref 3.87–5.11)
WBC: 7.2 10*3/uL (ref 4.5–10.5)

## 2012-09-25 LAB — TSH: TSH: 2.53 u[IU]/mL (ref 0.35–5.50)

## 2012-09-25 LAB — COMPREHENSIVE METABOLIC PANEL
Albumin: 3.8 g/dL (ref 3.5–5.2)
Alkaline Phosphatase: 91 U/L (ref 39–117)
BUN: 15 mg/dL (ref 6–23)
CO2: 32 mEq/L (ref 19–32)
GFR: 59.35 mL/min — ABNORMAL LOW (ref >60.00)
Glucose, Bld: 89 mg/dL (ref 70–99)
Potassium: 3.6 mEq/L (ref 3.5–5.1)

## 2012-09-25 LAB — MICROALBUMIN / CREATININE URINE RATIO: Microalb, Ur: 11.4 mg/dL — ABNORMAL HIGH (ref 0.0–1.9)

## 2012-09-25 MED ORDER — PAROXETINE HCL 20 MG PO TABS: 20.0000 mg | ORAL_TABLET | ORAL | Status: DC

## 2012-09-25 MED ORDER — CLONIDINE HCL 0.3 MG/24HR TD PTWK: 1.0000 | MEDICATED_PATCH | TRANSDERMAL | Status: DC
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
## 2012-09-26 ENCOUNTER — Ambulatory Visit: Payer: Medicare Other | Admitting: Family Medicine

## 2012-09-27 ENCOUNTER — Encounter: Payer: Self-pay | Admitting: Family Medicine

## 2012-09-27 DIAGNOSIS — I10 Essential (primary) hypertension: Secondary | ICD-10-CM | POA: Insufficient documentation

## 2012-09-27 DIAGNOSIS — F329 Major depressive disorder, single episode, unspecified: Secondary | ICD-10-CM | POA: Insufficient documentation

## 2012-09-27 NOTE — Assessment & Plan Note (Signed)
Chronic LE venous insufficiency. Check labs and if lytes/cr ok then will titrate diuretic up a bit. Reviewed low Na diet, elevation of legs regularly.

## 2012-09-27 NOTE — Assessment & Plan Note (Signed)
Hx of chronic poor control despite multiple meds per pt report. Add clonidine TTS 0.3 mg/24, 1 patch q week. Obtain cardiology records.

## 2012-09-27 NOTE — Assessment & Plan Note (Signed)
Lifelong.  Long hx of poor functioning, emotional trauma, and poor coping skills. She swears she has not had any trial of antidepressant. Will do trial of paxil 20mg  qhs.  Therapeutic expectations and side effect profile of medication discussed today.  Patient's questions answered.

## 2012-10-06 ENCOUNTER — Telehealth: Payer: Self-pay | Admitting: Nurse Practitioner

## 2012-10-06 NOTE — Telephone Encounter (Signed)
Need to know why needs referral

## 2012-10-06 NOTE — Telephone Encounter (Signed)
Please advise 

## 2012-10-06 NOTE — Telephone Encounter (Signed)
Pt seen a provider at [facility]. And they told her she needed to see cardiologist that she has a blockage and needed a referral.

## 2012-10-06 NOTE — Telephone Encounter (Signed)
They need to do referral if they saw her

## 2012-10-07 DIAGNOSIS — R609 Edema, unspecified: Secondary | ICD-10-CM | POA: Diagnosis not present

## 2012-10-07 DIAGNOSIS — Z882 Allergy status to sulfonamides status: Secondary | ICD-10-CM | POA: Diagnosis not present

## 2012-10-07 DIAGNOSIS — Z7901 Long term (current) use of anticoagulants: Secondary | ICD-10-CM | POA: Diagnosis not present

## 2012-10-07 DIAGNOSIS — J45901 Unspecified asthma with (acute) exacerbation: Secondary | ICD-10-CM | POA: Diagnosis not present

## 2012-10-07 DIAGNOSIS — E039 Hypothyroidism, unspecified: Secondary | ICD-10-CM | POA: Diagnosis not present

## 2012-10-07 DIAGNOSIS — Z885 Allergy status to narcotic agent status: Secondary | ICD-10-CM | POA: Diagnosis not present

## 2012-10-07 DIAGNOSIS — E669 Obesity, unspecified: Secondary | ICD-10-CM | POA: Diagnosis not present

## 2012-10-07 DIAGNOSIS — I890 Lymphedema, not elsewhere classified: Secondary | ICD-10-CM | POA: Diagnosis not present

## 2012-10-07 DIAGNOSIS — R0602 Shortness of breath: Secondary | ICD-10-CM | POA: Diagnosis not present

## 2012-10-07 DIAGNOSIS — I519 Heart disease, unspecified: Secondary | ICD-10-CM | POA: Diagnosis not present

## 2012-10-07 DIAGNOSIS — J9819 Other pulmonary collapse: Secondary | ICD-10-CM | POA: Diagnosis not present

## 2012-10-07 DIAGNOSIS — R6889 Other general symptoms and signs: Secondary | ICD-10-CM | POA: Diagnosis not present

## 2012-10-07 DIAGNOSIS — Z79899 Other long term (current) drug therapy: Secondary | ICD-10-CM | POA: Diagnosis not present

## 2012-10-07 DIAGNOSIS — I4891 Unspecified atrial fibrillation: Secondary | ICD-10-CM | POA: Diagnosis not present

## 2012-10-07 DIAGNOSIS — Z6839 Body mass index (BMI) 39.0-39.9, adult: Secondary | ICD-10-CM | POA: Diagnosis not present

## 2012-10-07 DIAGNOSIS — Z8679 Personal history of other diseases of the circulatory system: Secondary | ICD-10-CM | POA: Diagnosis not present

## 2012-10-07 DIAGNOSIS — I495 Sick sinus syndrome: Secondary | ICD-10-CM | POA: Diagnosis not present

## 2012-10-07 DIAGNOSIS — E872 Acidosis: Secondary | ICD-10-CM | POA: Diagnosis not present

## 2012-10-07 DIAGNOSIS — R0989 Other specified symptoms and signs involving the circulatory and respiratory systems: Secondary | ICD-10-CM | POA: Diagnosis not present

## 2012-10-07 DIAGNOSIS — I10 Essential (primary) hypertension: Secondary | ICD-10-CM | POA: Diagnosis not present

## 2012-10-07 DIAGNOSIS — Z87891 Personal history of nicotine dependence: Secondary | ICD-10-CM | POA: Diagnosis not present

## 2012-10-07 DIAGNOSIS — R079 Chest pain, unspecified: Secondary | ICD-10-CM | POA: Diagnosis not present

## 2012-10-07 DIAGNOSIS — J441 Chronic obstructive pulmonary disease with (acute) exacerbation: Secondary | ICD-10-CM | POA: Diagnosis not present

## 2012-10-07 NOTE — Telephone Encounter (Signed)
Patient is very upset hospital told her she needed a stent she is getting out of breath and very upset gave the phone to her husband to talk to me i instructed him to take her to the ER ASAP

## 2012-10-08 ENCOUNTER — Encounter: Payer: Self-pay | Admitting: Cardiology

## 2012-10-08 DIAGNOSIS — I4891 Unspecified atrial fibrillation: Secondary | ICD-10-CM

## 2012-10-08 DIAGNOSIS — R079 Chest pain, unspecified: Secondary | ICD-10-CM | POA: Diagnosis not present

## 2012-10-08 DIAGNOSIS — J45901 Unspecified asthma with (acute) exacerbation: Secondary | ICD-10-CM | POA: Diagnosis not present

## 2012-10-08 DIAGNOSIS — R0602 Shortness of breath: Secondary | ICD-10-CM | POA: Diagnosis not present

## 2012-10-09 ENCOUNTER — Ambulatory Visit: Payer: Medicare Other | Admitting: Family Medicine

## 2012-10-09 DIAGNOSIS — J45901 Unspecified asthma with (acute) exacerbation: Secondary | ICD-10-CM | POA: Diagnosis not present

## 2012-10-09 DIAGNOSIS — I5033 Acute on chronic diastolic (congestive) heart failure: Secondary | ICD-10-CM | POA: Diagnosis not present

## 2012-10-20 ENCOUNTER — Encounter: Payer: Self-pay | Admitting: Nurse Practitioner

## 2012-10-20 ENCOUNTER — Ambulatory Visit (INDEPENDENT_AMBULATORY_CARE_PROVIDER_SITE_OTHER): Payer: Medicare Other | Admitting: Nurse Practitioner

## 2012-10-20 VITALS — BP 138/76 | Temp 97.7°F | Ht 60.0 in | Wt 205.0 lb

## 2012-10-20 DIAGNOSIS — I509 Heart failure, unspecified: Secondary | ICD-10-CM | POA: Diagnosis not present

## 2012-10-20 DIAGNOSIS — R0989 Other specified symptoms and signs involving the circulatory and respiratory systems: Secondary | ICD-10-CM

## 2012-10-20 DIAGNOSIS — I872 Venous insufficiency (chronic) (peripheral): Secondary | ICD-10-CM | POA: Diagnosis not present

## 2012-10-20 DIAGNOSIS — L03119 Cellulitis of unspecified part of limb: Secondary | ICD-10-CM | POA: Diagnosis not present

## 2012-10-20 DIAGNOSIS — R0609 Other forms of dyspnea: Secondary | ICD-10-CM

## 2012-10-20 DIAGNOSIS — I878 Other specified disorders of veins: Secondary | ICD-10-CM

## 2012-10-20 DIAGNOSIS — L02419 Cutaneous abscess of limb, unspecified: Secondary | ICD-10-CM

## 2012-10-20 MED ORDER — CIPROFLOXACIN HCL 500 MG PO TABS: 500.0000 mg | ORAL_TABLET | Freq: Two times a day (BID) | ORAL | Status: DC

## 2012-10-20 MED ORDER — FUROSEMIDE 20 MG PO TABS: ORAL_TABLET | ORAL | Status: DC

## 2012-10-20 NOTE — Progress Notes (Signed)
  Subjective:    Patient ID: Sharon Compton, female    DOB: 1940/11/26, 72 y.o.   MRN: [id number]  HPI - Patient was discharged from hospital 8 days ago. She was admitted for 4 days for SOB. Was given O2 and breathing treatments around the clock while in hospital. She is doing better. Still has some SOB. She has a lot of fluid in her legs and feet. Currently on Lasix 20mg  2 BID.    Review of Systems  Constitutional: Negative.   Respiratory: Positive for shortness of breath.   Cardiovascular: Positive for leg swelling (2+edema).       Objective:   Physical Exam  Constitutional: She is oriented to person, place, and time. She appears well-developed and well-nourished.  Cardiovascular: Normal rate, regular rhythm and intact distal pulses.   Pulmonary/Chest: Effort normal. She has rales (bil lower ext).  Musculoskeletal: Edema: 3+ edema with vesiculer lesions all over lower ext.  Neurological: She is alert and oriented to person, place, and time.   BP 138/76  Temp(Src) 97.7 F (36.5 C) (Oral)  Ht 5' (1.524 m)  Wt 205 lb (92.987 kg)  BMI 40.04 kg/m2        Assessment & Plan:  1. Venous stasis Elevated legs when sitting Continue to wear compression hose Daily weights- if >2lb weight gain in 24hrs- call - furosemide (LASIX) 20 MG tablet; 1 PO qam and 1/2 around 2pm daily  Dispense: 60 tablet; Refill: 2  2. Lower extremity cellulitis - ciprofloxacin (CIPRO) 500 MG tablet; Take 1 tablet (500 mg total) by mouth 2 (two) times daily.  Dispense: 20 tablet; Refill: 0  3. DOE (dyspnea on exertion) Increased lasix dose - Brain natriuretic peptide  Mary-Margaret Daphine Deutscher, FNP

## 2012-10-20 NOTE — Patient Instructions (Signed)
Peripheral Edema  You have swelling in your legs (peripheral edema). This swelling is due to excess accumulation of salt and water in your body. Edema may be a sign of heart, kidney or liver disease, or a side effect of a medication. It may also be due to problems in the leg veins. Elevating your legs and using special support stockings may be very helpful, if the cause of the swelling is due to poor venous circulation. Avoid long periods of standing, whatever the cause.  Treatment of edema depends on identifying the cause. Chips, pretzels, pickles and other salty foods should be avoided. Restricting salt in your diet is almost always needed. Water pills (diuretics) are often used to remove the excess salt and water from your body via urine. These medicines prevent the kidney from reabsorbing sodium. This increases urine flow.  Diuretic treatment may also result in lowering of potassium levels in your body. Potassium supplements may be needed if you have to use diuretics daily. Daily weights can help you keep track of your progress in clearing your edema. You should call your caregiver for follow up care as recommended.  SEEK IMMEDIATE MEDICAL CARE IF:    You have increased swelling, pain, redness, or heat in your legs.   You develop shortness of breath, especially when lying down.   You develop chest or abdominal pain, weakness, or fainting.   You have a fever.  Document Released: 07/19/2004 Document Revised: 09/03/2011 Document Reviewed: 06/29/2009  ExitCare Patient Information 2013 [employer].

## 2012-10-21 LAB — BRAIN NATRIURETIC PEPTIDE: Brain Natriuretic Peptide: 65.6 pg/mL (ref 0.0–100.0)

## 2012-10-29 ENCOUNTER — Encounter: Payer: Self-pay | Admitting: Nurse Practitioner

## 2012-10-29 ENCOUNTER — Ambulatory Visit (INDEPENDENT_AMBULATORY_CARE_PROVIDER_SITE_OTHER): Payer: Medicare Other | Admitting: Nurse Practitioner

## 2012-10-29 VITALS — BP 118/66 | HR 62 | Temp 98.9°F | Ht 60.0 in | Wt 207.0 lb

## 2012-10-29 DIAGNOSIS — I872 Venous insufficiency (chronic) (peripheral): Secondary | ICD-10-CM

## 2012-10-29 DIAGNOSIS — I878 Other specified disorders of veins: Secondary | ICD-10-CM

## 2012-10-29 MED ORDER — FUROSEMIDE 40 MG PO TABS: 40.0000 mg | ORAL_TABLET | Freq: Two times a day (BID) | ORAL | Status: DC

## 2012-10-29 NOTE — Progress Notes (Signed)
  Subjective:    Patient ID: Sharon Compton, female    DOB: 07-Jun-1941, 72 y.o.   MRN: [id number]  HPI  Patient in fro follow-up of Venous stasis and SOB. We increased her lasix at last visit and told patient to wear compression hose daily. Still SOB    Review of Systems  Constitutional: Negative.   Respiratory: Negative for cough and shortness of breath.   Cardiovascular: Positive for leg swelling. Negative for chest pain and palpitations.  Hematological: Negative.   Psychiatric/Behavioral: Negative.        Objective:   Physical Exam  Constitutional: She appears well-developed and well-nourished.  Cardiovascular: Normal rate, normal heart sounds and intact distal pulses.   Pulmonary/Chest: Effort normal and breath sounds normal. She has no wheezes. She has no rales.  Musculoskeletal: She exhibits edema (2+ edema bil lower ext).   BP 118/66  Pulse 62  Temp(Src) 98.9 F (37.2 C) (Oral)  Ht 5' (1.524 m)  Wt 207 lb (93.895 kg)  BMI 40.43 kg/m2        Assessment & Plan:  unresolving venous stasis bil Lower ext  Increase Lasix to 40mg  1 PO BID #60 3 refills  Elevate legs when sitting  Compression hose daily  Daily weights- If increase of >2lbs in 24 hours  Mary-Margaret Daphine Deutscher, FNP

## 2012-10-29 NOTE — Patient Instructions (Signed)
Venous Stasis and Chronic Venous Insufficiency As people age, the veins located in their legs may weaken and stretch. When veins weaken and lose the ability to pump blood effectively, the condition is called chronic venous insufficiency (CVI) or venous stasis. Almost all veins return blood back to the heart. This happens by:  The force of the heart pumping fresh blood pushes blood back to the heart.  Blood flowing to the heart from the force of gravity. In the deep veins of the legs, blood has to fight gravity and flow upstream back to the heart. Here, the leg muscles contract to pump blood back toward the heart. Vein walls are elastic, and many veins have small valves that only allow blood to flow in one direction. When leg muscles contract, they push inward against the elastic vein walls. This squeezes blood upward, opens the valves, and moves blood toward the heart. When leg muscles relax, the vein wall also relaxes and the valves inside the vein close to prevent blood from flowing backward. This method of pumping blood out of the legs is called the venous pump. CAUSES  The venous pump works best while walking and leg muscles are contracting. But when a person sits or stands, blood pressure in leg veins can build. Deep veins are usually able to withstand short periods of inactivity, but long periods of inactivity (and increased pressure) can stretch, weaken, and damage vein walls. High blood pressure can also stretch and damage vein walls. The veins may no longer be able to pump blood back to the heart. Venous hypertension (high blood pressure inside veins) that lasts over time is a primary cause of CVI. CVI can also be caused by:   Deep vein thrombosis, a condition where a thrombus (blood clot) blocks blood flow in a vein.  Phlebitis, an inflammation of a superficial vein that causes a blood clot to form. Other risk factors for CVI may include:   Heredity.  Obesity.  Pregnancy.  Sedentary  lifestyle.  Smoking.  Jobs requiring long periods of standing or sitting in one place.  Age and gender:  Women in their 40's and 50's and men in their 70's are more prone to developing CVI. SYMPTOMS  Symptoms of CVI may include:   Varicose veins.  Ulceration or skin breakdown.  Lipodermatosclerosis, a condition that affects the skin just above the ankle, usually on the inside surface. Over time the skin becomes brown, smooth, tight and often painful. Those with this condition have a high risk of developing skin ulcers.  Reddened or discolored skin on the leg.  Swelling. DIAGNOSIS  Your caregiver can diagnose CVI after performing a careful medical history and physical examination. To confirm the diagnosis, the following tests may also be ordered:   Duplex ultrasound.  Plethysmography (tests blood flow).  Venograms (x-ray using a special dye). TREATMENT The goals of treatment for CVI are to restore a person to an active life and to minimize pain or disability. Typically, CVI does not pose a serious threat to life or limb, and with proper treatment most people with this condition can continue to lead active lives. In most cases, mild CVI can be treated on an outpatient basis with simple procedures. Treatment methods include:   Elastic compression socks.  Sclerotherapy, a procedure involving an injection of a material that "dissolves" the damaged veins. Other veins in the network of blood vessels take over the function of the damaged veins.  Vein stripping (an older procedure less commonly used).    Laser Ablation surgery.  Valve repair. HOME CARE INSTRUCTIONS   Elastic compression socks must be worn every day. They can help with symptoms and lower the chances of the problem getting worse, but they do not cure the problem.  Only take over-the-counter or prescription medicines for pain, discomfort, or fever as directed by your caregiver.  Your caregiver will review your other  medications with you. SEEK MEDICAL CARE IF:   You are confused about how to take your medications.  There is redness, swelling, or increasing pain in the affected area.  There is a red streak or line that extends up or down from the affected area.  There is a breakdown or loss of skin in the affected area, even if the breakdown is small.  You develop an unexplained oral temperature above 102 F (38.9 C).  There is an injury to the affected area. SEEK IMMEDIATE MEDICAL CARE IF:   There is an injury and open wound to the affected area.  Pain is not adequately relieved with pain medication prescribed or becomes severe.  An oral temperature above 102 F (38.9 C) develops.  The foot/ankle below the affected area becomes suddenly numb or the area feels weak and hard to move. MAKE SURE YOU:   Understand these instructions.  Will watch your condition.  Will get help right away if you are not doing well or get worse. Document Released: 10/15/2006 Document Revised: 09/03/2011 Document Reviewed: 12/23/2006 ExitCare Patient Information 2013 [employer].  

## 2012-11-03 ENCOUNTER — Encounter: Payer: Self-pay | Admitting: Nurse Practitioner

## 2012-11-03 ENCOUNTER — Ambulatory Visit (INDEPENDENT_AMBULATORY_CARE_PROVIDER_SITE_OTHER): Payer: Medicare Other | Admitting: Nurse Practitioner

## 2012-11-03 VITALS — BP 165/78 | HR 66 | Temp 98.0°F | Wt 204.0 lb

## 2012-11-03 DIAGNOSIS — R609 Edema, unspecified: Secondary | ICD-10-CM

## 2012-11-03 DIAGNOSIS — M1990 Unspecified osteoarthritis, unspecified site: Secondary | ICD-10-CM

## 2012-11-03 DIAGNOSIS — M129 Arthropathy, unspecified: Secondary | ICD-10-CM

## 2012-11-03 NOTE — Patient Instructions (Signed)
Peripheral Edema  You have swelling in your legs (peripheral edema). This swelling is due to excess accumulation of salt and water in your body. Edema may be a sign of heart, kidney or liver disease, or a side effect of a medication. It may also be due to problems in the leg veins. Elevating your legs and using special support stockings may be very helpful, if the cause of the swelling is due to poor venous circulation. Avoid long periods of standing, whatever the cause.  Treatment of edema depends on identifying the cause. Chips, pretzels, pickles and other salty foods should be avoided. Restricting salt in your diet is almost always needed. Water pills (diuretics) are often used to remove the excess salt and water from your body via urine. These medicines prevent the kidney from reabsorbing sodium. This increases urine flow.  Diuretic treatment may also result in lowering of potassium levels in your body. Potassium supplements may be needed if you have to use diuretics daily. Daily weights can help you keep track of your progress in clearing your edema. You should call your caregiver for follow up care as recommended.  SEEK IMMEDIATE MEDICAL CARE IF:    You have increased swelling, pain, redness, or heat in your legs.   You develop shortness of breath, especially when lying down.   You develop chest or abdominal pain, weakness, or fainting.   You have a fever.  Document Released: 07/19/2004 Document Revised: 09/03/2011 Document Reviewed: 06/29/2009  ExitCare Patient Information 2013 [employer].

## 2012-11-03 NOTE — Progress Notes (Signed)
  Subjective:    Patient ID: Sharon Compton, female    DOB: March 05, 1941, 72 y.o.   MRN: [id number]  HPI  Patient here today for follow-up of fluid retention. We frequently increased her lasix to 40 Mg daily. Patient says she has lost 5 lbs and was able to get her shoes on much easier today. Still has some DOE but has gotten better. * Patient c//o bil knee pain- Use to see dr. Janeth Rase in [place] but he has left. Needs new orthopedist.   Review of Systems  Constitutional: Positive for fatigue. Negative for activity change and appetite change.  Respiratory: Positive for shortness of breath. Cough: slight.   Cardiovascular: Negative.   Gastrointestinal: Negative.        Objective:   Physical Exam  Constitutional: She is oriented to person, place, and time. She appears well-developed and well-nourished.  Cardiovascular: Normal rate, normal heart sounds and intact distal pulses.   Pulmonary/Chest: Effort normal and breath sounds normal.  Musculoskeletal: Normal range of motion. She exhibits edema (2+ edema bil).  Neurological: She is alert and oriented to person, place, and time.   BP 165/78  Pulse 66  Temp(Src) 98 F (36.7 C) (Oral)  Wt 204 lb (92.534 kg)  BMI 39.84 kg/m2       Assessment & Plan:  1. Peripheral edema Elevate legs when sitting Continue compression hose Lasix as RX  2. Arthritis rest - Ambulatory referral to Orthopedic Surgery  Mary-Margaret Daphine Deutscher, FNP

## 2012-11-06 ENCOUNTER — Other Ambulatory Visit: Payer: Self-pay

## 2012-11-06 MED ORDER — ALPRAZOLAM 1 MG PO TABS: 1.0000 mg | ORAL_TABLET | Freq: Four times a day (QID) | ORAL | Status: DC | PRN

## 2012-11-06 MED ORDER — GABAPENTIN 400 MG PO CAPS: 400.0000 mg | ORAL_CAPSULE | Freq: Four times a day (QID) | ORAL | Status: DC

## 2012-11-06 NOTE — Telephone Encounter (Signed)
Last seen 11/03/12   Call in both meds and have nurse call patient to let her know

## 2012-11-06 NOTE — Telephone Encounter (Signed)
Please call in xanax rx 

## 2012-11-07 ENCOUNTER — Telehealth: Payer: Self-pay | Admitting: Nurse Practitioner

## 2012-11-07 NOTE — Telephone Encounter (Signed)
Scripts called to [facility].

## 2012-11-11 ENCOUNTER — Other Ambulatory Visit: Payer: Self-pay

## 2012-11-11 MED ORDER — DILTIAZEM HCL ER COATED BEADS 240 MG PO CP24: 240.0000 mg | ORAL_CAPSULE | Freq: Every day | ORAL | Status: DC

## 2012-12-08 ENCOUNTER — Other Ambulatory Visit: Payer: Self-pay | Admitting: Nurse Practitioner

## 2012-12-11 NOTE — Telephone Encounter (Signed)
Last seen 11/03/12  Last filled 11/06/12   Phone in and have nurse notify patient

## 2012-12-11 NOTE — Telephone Encounter (Signed)
RX called to vm. 

## 2012-12-11 NOTE — Telephone Encounter (Signed)
Call in xanax rx with 1 refill

## 2012-12-12 ENCOUNTER — Other Ambulatory Visit: Payer: Self-pay

## 2012-12-12 MED ORDER — LISINOPRIL 20 MG PO TABS: 20.0000 mg | ORAL_TABLET | Freq: Every day | ORAL | Status: DC

## 2012-12-19 ENCOUNTER — Ambulatory Visit: Payer: Medicare Other | Admitting: Nurse Practitioner

## 2013-01-07 ENCOUNTER — Other Ambulatory Visit: Payer: Self-pay | Admitting: Nurse Practitioner

## 2013-02-05 ENCOUNTER — Other Ambulatory Visit: Payer: Self-pay | Admitting: Nurse Practitioner

## 2013-02-06 ENCOUNTER — Encounter: Payer: Self-pay | Admitting: Cardiovascular Disease

## 2013-02-06 ENCOUNTER — Telehealth: Payer: Self-pay | Admitting: Cardiovascular Disease

## 2013-02-06 ENCOUNTER — Ambulatory Visit (INDEPENDENT_AMBULATORY_CARE_PROVIDER_SITE_OTHER): Payer: Medicare Other | Admitting: Cardiovascular Disease

## 2013-02-06 VITALS — BP 138/78 | HR 65 | Ht 60.0 in | Wt 207.4 lb

## 2013-02-06 DIAGNOSIS — I4891 Unspecified atrial fibrillation: Secondary | ICD-10-CM

## 2013-02-06 DIAGNOSIS — I10 Essential (primary) hypertension: Secondary | ICD-10-CM | POA: Diagnosis not present

## 2013-02-06 DIAGNOSIS — R609 Edema, unspecified: Secondary | ICD-10-CM

## 2013-02-06 DIAGNOSIS — R600 Localized edema: Secondary | ICD-10-CM

## 2013-02-06 DIAGNOSIS — I878 Other specified disorders of veins: Secondary | ICD-10-CM

## 2013-02-06 DIAGNOSIS — I5032 Chronic diastolic (congestive) heart failure: Secondary | ICD-10-CM

## 2013-02-06 DIAGNOSIS — I872 Venous insufficiency (chronic) (peripheral): Secondary | ICD-10-CM

## 2013-02-06 LAB — BASIC METABOLIC PANEL
BUN: 19 mg/dL (ref 6–23)
CO2: 32 mEq/L (ref 19–32)
Calcium: 9.4 mg/dL (ref 8.4–10.5)
Chloride: 98 mEq/L (ref 96–112)
Creatinine, Ser: 1.1 mg/dL (ref 0.4–1.2)
Glucose, Bld: 86 mg/dL (ref 70–99)

## 2013-02-06 MED ORDER — FUROSEMIDE 40 MG PO TABS: 80.0000 mg | ORAL_TABLET | Freq: Two times a day (BID) | ORAL | Status: DC

## 2013-02-06 MED ORDER — POTASSIUM CHLORIDE CRYS ER 20 MEQ PO TBCR: 20.0000 meq | EXTENDED_RELEASE_TABLET | Freq: Every day | ORAL | Status: DC

## 2013-02-06 NOTE — Telephone Encounter (Signed)
Records rec From [facility] gave to [facility] 02/06/13/KM

## 2013-02-06 NOTE — Patient Instructions (Addendum)
Your physician recommends that you schedule a follow-up appointment in:  3 weeks. --February 26, 2013 at 2:00  Your physician has recommended you make the following change in your medication:  Increase furosemide to 80 mg by mouth two times daily. Start Potassium 20 meq by mouth daily.
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
## 2013-02-06 NOTE — Telephone Encounter (Signed)
ROI faxed to [facility]  At [phone] 02/06/13/KM

## 2013-02-09 ENCOUNTER — Telehealth: Payer: Self-pay | Admitting: Nurse Practitioner

## 2013-02-09 NOTE — Telephone Encounter (Signed)
Last seen 11/03/12  MMM  Last filled 12/08/12   If approved phone in and route to nurse

## 2013-02-09 NOTE — Telephone Encounter (Signed)
Please call in xanax rx with 1 refill 

## 2013-02-09 NOTE — Telephone Encounter (Signed)
Last seen 11/03/12  MMM   Last filled 12/08/12   If approved route to nurse and phone in

## 2013-02-10 NOTE — Telephone Encounter (Signed)
RX CALLED IN TO [facility]

## 2013-02-10 NOTE — Telephone Encounter (Signed)
Rx called in 

## 2013-02-11 ENCOUNTER — Other Ambulatory Visit: Payer: Self-pay | Admitting: Family Medicine

## 2013-02-23 DIAGNOSIS — I2510 Atherosclerotic heart disease of native coronary artery without angina pectoris: Secondary | ICD-10-CM

## 2013-02-23 HISTORY — DX: Atherosclerotic heart disease of native coronary artery without angina pectoris: I25.10

## 2013-02-23 HISTORY — PX: PERCUTANEOUS CORONARY STENT INTERVENTION (PCI-S): SHX6016

## 2013-02-23 HISTORY — PX: CORONARY ANGIOPLASTY: SHX604

## 2013-02-26 ENCOUNTER — Encounter: Payer: Self-pay | Admitting: *Deleted

## 2013-02-26 ENCOUNTER — Encounter (HOSPITAL_COMMUNITY): Payer: Self-pay | Admitting: Pharmacy Technician

## 2013-02-26 ENCOUNTER — Encounter: Payer: Self-pay | Admitting: Cardiovascular Disease

## 2013-02-26 ENCOUNTER — Ambulatory Visit (INDEPENDENT_AMBULATORY_CARE_PROVIDER_SITE_OTHER): Payer: Medicare Other | Admitting: Cardiovascular Disease

## 2013-02-26 VITALS — BP 140/77 | HR 60 | Ht 60.0 in | Wt 205.0 lb

## 2013-02-26 DIAGNOSIS — I872 Venous insufficiency (chronic) (peripheral): Secondary | ICD-10-CM

## 2013-02-26 DIAGNOSIS — I4891 Unspecified atrial fibrillation: Secondary | ICD-10-CM

## 2013-02-26 DIAGNOSIS — R600 Localized edema: Secondary | ICD-10-CM

## 2013-02-26 DIAGNOSIS — I5032 Chronic diastolic (congestive) heart failure: Secondary | ICD-10-CM | POA: Diagnosis not present

## 2013-02-26 DIAGNOSIS — I878 Other specified disorders of veins: Secondary | ICD-10-CM

## 2013-02-26 DIAGNOSIS — R609 Edema, unspecified: Secondary | ICD-10-CM | POA: Diagnosis not present

## 2013-02-26 LAB — CBC WITH DIFFERENTIAL/PLATELET
Basophils Relative: 0.5 % (ref 0.0–3.0)
Eosinophils Absolute: 0.1 10*3/uL (ref 0.0–0.7)
HCT: 41.1 % (ref 36.0–46.0)
Hemoglobin: 13.6 g/dL (ref 12.0–15.0)
Lymphocytes Relative: 43.3 % (ref 12.0–46.0)
MCHC: 33.2 g/dL (ref 30.0–36.0)
MCV: 81.2 fl (ref 78.0–100.0)
Monocytes Absolute: 0.4 10*3/uL (ref 0.1–1.0)
Neutro Abs: 3.8 10*3/uL (ref 1.4–7.7)
RBC: 5.06 Mil/uL (ref 3.87–5.11)

## 2013-02-26 LAB — BASIC METABOLIC PANEL
BUN: 20 mg/dL (ref 6–23)
Creatinine, Ser: 1.1 mg/dL (ref 0.4–1.2)
Glucose, Bld: 94 mg/dL (ref 70–99)

## 2013-02-26 MED ORDER — PREDNISONE 20 MG PO TABS: ORAL_TABLET | ORAL | Status: DC
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
## 2013-02-26 NOTE — Patient Instructions (Addendum)
Your physician recommends that you schedule a follow-up appointment in: about 8 weeks.   Your physician has requested that you have a cardiac catheterization. Cardiac catheterization is used to diagnose and/or treat various heart conditions. Doctors may recommend this procedure for a number of different reasons. The most common reason is to evaluate chest pain. Chest pain can be a symptom of coronary artery disease (CAD), and cardiac catheterization can show whether plaque is narrowing or blocking your heart's arteries. This procedure is also used to evaluate the valves, as well as measure the blood flow and oxygen levels in different parts of your heart. For further information please visit [email or web address] Please follow instruction sheet, as given. Scheduled for March 03, 2013   Coronary Angiography Coronary angiography is an X-ray procedure used to look at the arteries in the heart. In this procedure, a dye is injected through a long, hollow tube (catheter). The catheter is about the size of a piece of cooked spaghetti. The catheter injects a dye into an artery in your groin. X-rays are then taken to show if there is a blockage in the arteries of your heart. BEFORE THE PROCEDURE   Let your caregiver know if you have allergies to shellfish or contrast dye. Also let your caregiver know if you have kidney problems or failure.  Do not eat or drink starting from midnight up to the time of the procedure, or as directed.  You may drink enough water to take your medications the morning of the procedure if you were instructed to do so.  You should be at the hospital or outpatient facility where the procedure is to be done 60 minutes prior to the procedure or as directed. PROCEDURE  You may be given an IV medication to help you relax before the procedure.  You will be prepared for the procedure by washing and shaving the area where the catheter will be inserted. This is usually done in the groin  but may be done in the fold of your arm by your elbow.  A medicine will be given to numb your groin where the catheter will be inserted.  A specially trained doctor will insert the catheter into an artery in your groin. The catheter is guided by using a special type of X-ray (fluoroscopy) to the blood vessel being examined.  A special dye is then injected into the catheter and X-rays are taken. The dye helps to show where any narrowing or blockages are located in the heart arteries. AFTER THE PROCEDURE   After the procedure you will be kept in bed lying flat for several hours. You will be instructed to not bend or cross your legs.  The groin insertion site will be watched and checked frequently.  The pulse in your feet will be checked frequently.  Additional blood tests, X-rays and an EKG may be done.  You may stay in the hospital overnight for observation. SEEK IMMEDIATE MEDICAL CARE IF:   You develop chest pain, shortness of breath, feel faint, or pass out.  There is bleeding, swelling, or drainage from the catheter insertion site.  You develop pain, discoloration, coldness, or severe bruising in the leg or area where the catheter was inserted.  You have a fever. Document Released: 12/16/2002 Document Revised: 09/03/2011 Document Reviewed: 02/04/2008 [facility] Patient Information 2014 [place], [place].

## 2013-02-27 ENCOUNTER — Telehealth: Payer: Self-pay | Admitting: Cardiovascular Disease

## 2013-02-27 ENCOUNTER — Encounter: Payer: Self-pay | Admitting: *Deleted

## 2013-02-27 ENCOUNTER — Encounter: Payer: Self-pay | Admitting: Nurse Practitioner

## 2013-02-27 NOTE — Telephone Encounter (Signed)
New problem    Need something in written showing patient has procedure in hospital on  9/9 in order for patient be pick up. Office phone  [phone].

## 2013-02-27 NOTE — Telephone Encounter (Signed)
Letter written. Will fax to [facility] at [phone].

## 2013-02-27 NOTE — Telephone Encounter (Signed)
Spoke with patient's husband who states [facility] needs verification of appointment for 9/9.  I called [facility] at [phone] to find out what was needed for transportation to be arranged.  Representative states that they need a letter stating patient has procedure scheduled that cannot be changed.  I routed message to Charlotte Crumb, RN per her request.

## 2013-03-03 ENCOUNTER — Ambulatory Visit (HOSPITAL_COMMUNITY)
Admission: RE | Admit: 2013-03-03 | Discharge: 2013-03-05 | Disposition: A | Discharge status: Home / Self Care | Payer: Medicare Other | Source: Ambulatory Visit | Attending: Cardiology | Admitting: Cardiology

## 2013-03-03 ENCOUNTER — Encounter (HOSPITAL_COMMUNITY)
Admission: RE | Disposition: A | Discharge status: Home / Self Care | Payer: Medicare Other | Source: Ambulatory Visit | Attending: Cardiology

## 2013-03-03 DIAGNOSIS — E669 Obesity, unspecified: Secondary | ICD-10-CM | POA: Diagnosis not present

## 2013-03-03 DIAGNOSIS — J449 Chronic obstructive pulmonary disease, unspecified: Secondary | ICD-10-CM | POA: Insufficient documentation

## 2013-03-03 DIAGNOSIS — I10 Essential (primary) hypertension: Secondary | ICD-10-CM | POA: Diagnosis not present

## 2013-03-03 DIAGNOSIS — R9439 Abnormal result of other cardiovascular function study: Secondary | ICD-10-CM | POA: Insufficient documentation

## 2013-03-03 DIAGNOSIS — Z6841 Body Mass Index (BMI) 40.0 and over, adult: Secondary | ICD-10-CM | POA: Insufficient documentation

## 2013-03-03 DIAGNOSIS — F3289 Other specified depressive episodes: Secondary | ICD-10-CM | POA: Insufficient documentation

## 2013-03-03 DIAGNOSIS — I5033 Acute on chronic diastolic (congestive) heart failure: Secondary | ICD-10-CM | POA: Diagnosis present

## 2013-03-03 DIAGNOSIS — I509 Heart failure, unspecified: Secondary | ICD-10-CM | POA: Insufficient documentation

## 2013-03-03 DIAGNOSIS — R031 Nonspecific low blood-pressure reading: Secondary | ICD-10-CM | POA: Insufficient documentation

## 2013-03-03 DIAGNOSIS — F411 Generalized anxiety disorder: Secondary | ICD-10-CM | POA: Insufficient documentation

## 2013-03-03 DIAGNOSIS — E785 Hyperlipidemia, unspecified: Secondary | ICD-10-CM | POA: Diagnosis not present

## 2013-03-03 DIAGNOSIS — Z79899 Other long term (current) drug therapy: Secondary | ICD-10-CM | POA: Diagnosis not present

## 2013-03-03 DIAGNOSIS — R0989 Other specified symptoms and signs involving the circulatory and respiratory systems: Secondary | ICD-10-CM | POA: Insufficient documentation

## 2013-03-03 DIAGNOSIS — E876 Hypokalemia: Secondary | ICD-10-CM | POA: Diagnosis not present

## 2013-03-03 DIAGNOSIS — I209 Angina pectoris, unspecified: Secondary | ICD-10-CM | POA: Diagnosis not present

## 2013-03-03 DIAGNOSIS — J4489 Other specified chronic obstructive pulmonary disease: Secondary | ICD-10-CM | POA: Insufficient documentation

## 2013-03-03 DIAGNOSIS — R0609 Other forms of dyspnea: Secondary | ICD-10-CM | POA: Diagnosis not present

## 2013-03-03 DIAGNOSIS — I5032 Chronic diastolic (congestive) heart failure: Secondary | ICD-10-CM

## 2013-03-03 DIAGNOSIS — F329 Major depressive disorder, single episode, unspecified: Secondary | ICD-10-CM | POA: Diagnosis not present

## 2013-03-03 DIAGNOSIS — I2510 Atherosclerotic heart disease of native coronary artery without angina pectoris: Secondary | ICD-10-CM | POA: Diagnosis not present

## 2013-03-03 DIAGNOSIS — I4891 Unspecified atrial fibrillation: Secondary | ICD-10-CM | POA: Diagnosis not present

## 2013-03-03 HISTORY — PX: LEFT AND RIGHT HEART CATHETERIZATION WITH CORONARY ANGIOGRAM: SHX5449

## 2013-03-03 LAB — POCT I-STAT 3, VENOUS BLOOD GAS (G3P V)
Bicarbonate: 25.1 mEq/L — ABNORMAL HIGH (ref 20.0–24.0)
O2 Saturation: 65 %
TCO2: 27 mmol/L (ref 0–100)
pCO2, Ven: 49.7 mmHg (ref 45.0–50.0)
pH, Ven: 7.312 — ABNORMAL HIGH (ref 7.250–7.300)

## 2013-03-03 LAB — POCT I-STAT 3, ART BLOOD GAS (G3+)
O2 Saturation: 88 %
TCO2: 29 mmol/L (ref 0–100)
pCO2 arterial: 46.8 mmHg — ABNORMAL HIGH (ref 35.0–45.0)
pH, Arterial: 7.377 (ref 7.350–7.450)
pO2, Arterial: 56 mmHg — ABNORMAL LOW (ref 80.0–100.0)

## 2013-03-03 SURGERY — LEFT AND RIGHT HEART CATHETERIZATION WITH CORONARY ANGIOGRAM
Anesthesia: LOCAL

## 2013-03-03 MED ORDER — FAMOTIDINE IN NACL 20-0.9 MG/50ML-% IV SOLN
INTRAVENOUS | Status: AC
  Filled 2013-03-03: qty 50

## 2013-03-03 MED ORDER — ASPIRIN EC 81 MG PO TBEC
81.0000 mg | DELAYED_RELEASE_TABLET | Freq: Every day | ORAL | Status: DC
  Administered 2013-03-04 – 2013-03-05 (×2): 81 mg via ORAL
  Filled 2013-03-03: qty 1

## 2013-03-03 MED ORDER — MIDAZOLAM HCL 2 MG/2ML IJ SOLN
INTRAMUSCULAR | Status: AC
  Filled 2013-03-03: qty 2

## 2013-03-03 MED ORDER — POLYSACCHARIDE IRON COMPLEX 150 MG PO CAPS
150.0000 mg | ORAL_CAPSULE | Freq: Every day | ORAL | Status: DC
  Administered 2013-03-03 – 2013-03-04 (×2): 150 mg via ORAL
  Filled 2013-03-03 (×3): qty 1

## 2013-03-03 MED ORDER — ONDANSETRON HCL 4 MG/2ML IJ SOLN
4.0000 mg | Freq: Four times a day (QID) | INTRAMUSCULAR | Status: DC | PRN
  Administered 2013-03-04: 4 mg via INTRAVENOUS
  Filled 2013-03-03: qty 2

## 2013-03-03 MED ORDER — NITROGLYCERIN 0.2 MG/ML ON CALL CATH LAB
INTRAVENOUS | Status: AC
  Filled 2013-03-03: qty 1

## 2013-03-03 MED ORDER — SODIUM CHLORIDE 0.9 % IV SOLN
INTRAVENOUS | Status: DC
  Administered 2013-03-03: 11:00:00 via INTRAVENOUS

## 2013-03-03 MED ORDER — FENTANYL CITRATE 0.05 MG/ML IJ SOLN
INTRAMUSCULAR | Status: AC
  Filled 2013-03-03: qty 2

## 2013-03-03 MED ORDER — GABAPENTIN 400 MG PO CAPS
400.0000 mg | ORAL_CAPSULE | Freq: Four times a day (QID) | ORAL | Status: DC
  Administered 2013-03-03 – 2013-03-05 (×6): 400 mg via ORAL
  Filled 2013-03-03 (×6): qty 1

## 2013-03-03 MED ORDER — DIPHENHYDRAMINE HCL 50 MG/ML IJ SOLN
INTRAMUSCULAR | Status: AC
  Filled 2013-03-03: qty 1

## 2013-03-03 MED ORDER — ACETAMINOPHEN 325 MG PO TABS: 650.0000 mg | ORAL_TABLET | ORAL | Status: DC | PRN

## 2013-03-03 MED ORDER — ALPRAZOLAM 0.25 MG PO TABS
1.0000 mg | ORAL_TABLET | Freq: Four times a day (QID) | ORAL | Status: DC | PRN
  Administered 2013-03-03 – 2013-03-05 (×5): 1 mg via ORAL
  Filled 2013-03-03 (×5): qty 4

## 2013-03-03 MED ORDER — CLOPIDOGREL BISULFATE 75 MG PO TABS
75.0000 mg | ORAL_TABLET | Freq: Every day | ORAL | Status: DC
  Administered 2013-03-04 – 2013-03-05 (×2): 75 mg via ORAL
  Filled 2013-03-03 (×2): qty 1

## 2013-03-03 MED ORDER — POTASSIUM CHLORIDE CRYS ER 20 MEQ PO TBCR
20.0000 meq | EXTENDED_RELEASE_TABLET | Freq: Every day | ORAL | Status: DC
  Administered 2013-03-03 – 2013-03-05 (×3): 20 meq via ORAL
  Filled 2013-03-03 (×4): qty 1

## 2013-03-03 MED ORDER — SODIUM CHLORIDE 0.9 % IV SOLN: 250.0000 mL | INTRAVENOUS | Status: DC | PRN

## 2013-03-03 MED ORDER — LIDOCAINE HCL (PF) 1 % IJ SOLN
INTRAMUSCULAR | Status: AC
  Filled 2013-03-03: qty 30

## 2013-03-03 MED ORDER — DIAZEPAM 5 MG PO TABS
ORAL_TABLET | ORAL | Status: AC
  Filled 2013-03-03: qty 1

## 2013-03-03 MED ORDER — DILTIAZEM HCL ER COATED BEADS 240 MG PO CP24: 240.0000 mg | ORAL_CAPSULE | Freq: Every day | ORAL | Status: DC

## 2013-03-03 MED ORDER — ASPIRIN 81 MG PO CHEW
CHEWABLE_TABLET | ORAL | Status: AC
  Filled 2013-03-03: qty 4

## 2013-03-03 MED ORDER — SODIUM CHLORIDE 0.9 % IJ SOLN: 3.0000 mL | INTRAMUSCULAR | Status: DC | PRN

## 2013-03-03 MED ORDER — HEPARIN (PORCINE) IN NACL 2-0.9 UNIT/ML-% IJ SOLN
INTRAMUSCULAR | Status: AC
  Filled 2013-03-03: qty 1000

## 2013-03-03 MED ORDER — ZOLPIDEM TARTRATE 5 MG PO TABS
5.0000 mg | ORAL_TABLET | Freq: Every evening | ORAL | Status: DC | PRN
  Administered 2013-03-03 – 2013-03-04 (×2): 5 mg via ORAL
  Filled 2013-03-03 (×2): qty 1

## 2013-03-03 MED ORDER — BIVALIRUDIN 250 MG IV SOLR
INTRAVENOUS | Status: AC
  Filled 2013-03-03: qty 250

## 2013-03-03 MED ORDER — ASPIRIN 81 MG PO CHEW
324.0000 mg | CHEWABLE_TABLET | ORAL | Status: AC
  Administered 2013-03-03: 324 mg via ORAL

## 2013-03-03 MED ORDER — SIMVASTATIN 20 MG PO TABS
20.0000 mg | ORAL_TABLET | Freq: Every day | ORAL | Status: DC
  Administered 2013-03-03: 20 mg via ORAL
  Filled 2013-03-03: qty 1

## 2013-03-03 MED ORDER — DIAZEPAM 5 MG PO TABS
5.0000 mg | ORAL_TABLET | ORAL | Status: AC
  Administered 2013-03-03: 5 mg via ORAL

## 2013-03-03 MED ORDER — METOPROLOL TARTRATE 50 MG PO TABS
50.0000 mg | ORAL_TABLET | Freq: Two times a day (BID) | ORAL | Status: DC
  Administered 2013-03-03: 50 mg via ORAL
  Filled 2013-03-03: qty 2

## 2013-03-03 MED ORDER — DIPHENHYDRAMINE HCL 50 MG/ML IJ SOLN
25.0000 mg | INTRAMUSCULAR | Status: AC
  Administered 2013-03-03: 25 mg via INTRAVENOUS

## 2013-03-03 MED ORDER — CLOPIDOGREL BISULFATE 300 MG PO TABS
ORAL_TABLET | ORAL | Status: AC
  Filled 2013-03-03: qty 2

## 2013-03-03 MED ORDER — LISINOPRIL 20 MG PO TABS: 20.0000 mg | ORAL_TABLET | Freq: Every day | ORAL | Status: DC

## 2013-03-03 MED ORDER — SODIUM CHLORIDE 0.9 % IJ SOLN: 3.0000 mL | Freq: Two times a day (BID) | INTRAMUSCULAR | Status: DC

## 2013-03-03 MED ORDER — SODIUM CHLORIDE 0.9 % IV SOLN: INTRAVENOUS | Status: AC

## 2013-03-03 MED ORDER — FUROSEMIDE 10 MG/ML IJ SOLN
80.0000 mg | Freq: Three times a day (TID) | INTRAMUSCULAR | Status: DC
  Administered 2013-03-03 – 2013-03-05 (×6): 80 mg via INTRAVENOUS
  Filled 2013-03-03 (×5): qty 8
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
## 2013-03-03 NOTE — Progress Notes (Signed)
Patient on bedrest post sheath removal at 1855. Stepped out to get po medications after telling the patient and found patient sitting up in bed with her drink. Right groin site checked and found it was bleeding. Pressure held for 15 minutes and q 5 minute blood pressures. Blood pressures stable 140's /50-60's. Pressure dressing gauze secured with medipore tape applied. No hematoma noted. Right groin area soft. Pedal pulse +2 without change. Instructed patient not to sit up and to call if noting any warmth or wetness at groin site. She again agreed to comply with post sheath pull instructions. Rechecked groin at 1930 without change, dressing dry and intact. Theodore Demark PA notified at 1940 via phone.

## 2013-03-03 NOTE — Progress Notes (Signed)
Site area: right groin  Site Prior to Removal:  Level 0  Pressure Applied For 20 MINUTES    Minutes Beginning at 1600  Manual:   yes  Patient Status During Pull:  stable  Post Pull Groin Site:  Level 0  Post Pull Instructions Given:  yes, instructed to keep right leg straight to hold pressure on right groin prior and during any coughing or sneezing and then call so groin can be checked, report any wetness or warm feeling at groin site. Patient agreed to report above symptoms and keep right leg straight.  Post Pull Pulses Present:  yes, doppler right pedal  Dressing Applied:  yes, Gauze pressure dressing secured with medipore tape.  Comments:  Rechecked groin site at 1635 and 1650 without change. Dressing dry and intact. Doppler pulse present.

## 2013-03-03 NOTE — Interval H&P Note (Signed)
History and Physical Interval Note:  03/03/2013 11:48 AM  Sharon Compton  has presented today for cardiac cath with the diagnosis of CHF.  The various methods of treatment have been discussed with the patient and family. After consideration of risks, benefits and other options for treatment, the patient has consented to  Procedure(s): LEFT AND RIGHT HEART CATHETERIZATION WITH CORONARY ANGIOGRAM (N/A) as a surgical intervention .  The patient's history has been reviewed, patient examined, no change in status, stable for surgery.  I have reviewed the patient's chart and labs.  Questions were answered to the patient's satisfaction.    Cath Lab Visit (complete for each Cath Lab visit)  Clinical Evaluation Leading to the Procedure:   ACS: no  Non-ACS:    Anginal Classification: Class II  Anti-ischemic medical therapy: Minimal Therapy (1 class of medications)  Non-Invasive Test Results: Low-risk stress test findings: cardiac mortality <1%/year  Prior CABG: No previous CABG        Sharon Compton

## 2013-03-03 NOTE — CV Procedure (Signed)
Cardiac Catheterization Operative Report  Sharon Compton [id number] 9/9/20141:15 PM Rudi Heap, MD  Procedure Performed:  1. Left Heart Catheterization 2. Selective Coronary Angiography 3. Right Heart Catheterization 4. Left ventricular angiogram 5. PTCA/bare metal stent x 1 distal RCA  Operator: Verne Carrow, MD  Indication:72 yo female with history of GERD, HTN, HLD, atrial fibrillation, GI bleeding on anti-coagulation (?AVM) patient of Dr. Dietrich Pates who is here today for cardiac cath. She was seen in [facility] in April 2013 by Dr. Dietrich Pates and Diona Browner. She had been followed in [place] by [facility] cardiology but her doctor left [place]. Her history is very complex and her recollection of past events is scattered. Reports dyspnea with minimal exertion. Weight is up significantly over last 2 years per pt. She reports history of atrial fibrillation. She had what sounds like a TEE guided cardioversion at [facility] February 2014. I have none of those records. It sounds like she was started on Xarelto and had onset of GI bleeding so this was stopped. She was admitted to [facility] April 2014 with GI bleeding. She had undergone testing for GI bleed in March 2013. She underwent a colonoscopy and EGD in March 2013. This revealed gastric erosions and H. pylori serologies were sent. She also had colonoscopy, which revealed cecal AVMs. During admission April 2013, she was given 4 units of FFP and 3 units of packed red blood cells. GI was consulted. Her Xarelto was stopped. She had serial cardiac enzymes drawn for some chest discomfort arm pain and dyspnea. Her peak troponin was 1.86. Cardiology was consulted (Dr. Dietrich Pates) and this was felt to be demand ischemia in setting of profound anemia. Echocardiogram was done. Patient was subsequently cleared for procedure. She had poor prep. Repeat was attempted, but she developed chest pain pre-procedure. Stress test could not  unequivocally rule out ischemia showing possible distal septal and inferolateral scar, normal wall motion. Catheterization was recommended, but patient declined. She also declined any further testing including colonoscopy.  Echo April 2014 at [facility] with normal LV systolic function, LVEF=65%, grade 1 diastolic dysfunction, no signficant valvular disease.  She has recently had worsened dyspnea with minimal exertion/mild chest pain c/w class III angina. Also massive volume overload, slowly increasing for two years.                                   Procedure Details: The risks, benefits, complications, treatment options, and expected outcomes were discussed with the patient. The patient and/or family concurred with the proposed plan, giving informed consent. The patient was brought to the cath lab after IV hydration was begun and oral premedication was given. The patient was further sedated with Versed and Fentanyl. The right groin was prepped and draped in the usual manner. Using the modified Seldinger access technique, a 5 French sheath was placed in the right femoral artery. A 6 French sheath was inserted into the right femoral vein. A balloon tipped catheter was used to perform a right heart catheterization. Standard diagnostic catheters were used to perform selective coronary angiography. A pigtail catheter was used to perform a left ventricular angiogram. The patient was found to have severe stenosis of the distal RCA. PCI was planned given her symptoms of profound dyspnea, chest pain (class III angina) with ischemia on stress testing.   PCI Note: The sheath was upsized to a 6 [place] system. She was given a  bolus of Angiomax and a drip was started. She was given Plavix 600 mg po x 1. I then engaged the RCA with a JR4 guide. When the ACT was over 200, I passed a BMW wire down the RCA. I then used a 2.5 x 15 Sprinter balloon to pre-dilate the stenosis. I then deployed a 2.75 x 32 mm Rebel bare metal  stent in the distal RCA. I post-dilated the distal stented segment with a Euphora 2.75 x 12 mm Lynn balloon x 2. I then post-dilated the proximal stented segment with a 3.0 x 15 mm Euphora Allgood balloon x 2. There was an excellent result. The small PDA was jailed by the stent but there was excellent flow down this branch.   There were no immediate complications. The patient was taken to the recovery area in stable condition.   Hemodynamic Findings: Ao: 187/87          LV: 178/15/26 RA:   10             RV: 40/11/15 PA:  35/20 (mean 27)       PCWP:  22 Fick Cardiac Output: 5.91 L/min Fick Cardiac Index: 3.13 L/min/m2 Central Aortic Saturation: 88% Pulmonary Artery Saturation: 65%  Angiographic Findings:  Left main: No obstructive disease.   Left Anterior Descending Artery: Large caliber vessel that courses to the apex. The mid vessel has a 30% stenosis. The first diagonal branch has mild plaque. The second diagonal branch is small to moderate in caliber with 50% ostial stenosis.   Circumflex Artery: Moderate caliber vessel with no obstructive disease.   Right Coronary Artery: Large dominant vessel with serial 30% proximal and mid stenoses. The distal vessel has a 99% stenosis followed by a 80% stenosis just before the bifurcation. The Posterolateral branch is the larger of the two distal branches and has mild diffuse plaque. The PDA is small in caliber with 40% ostial stenosis.   Left Ventricular Angiogram:LVEF=65-70%  Impression: 1. Severe single vessel CAD presenting with dyspnea on exertion/chest pain c/w class III angina 2. Abnormal stress test with evidence of ischemia 3. Preserved LV systolic function 4. Successful PTCA/bare metal stent x 1 distal RCA  Recommendations: She will need ASA and Plavix for at least one month but for one year if she tolerates. Will continue statin, beta blocker.        Complications:  None; patient tolerated the procedure well.

## 2013-03-04 ENCOUNTER — Encounter (HOSPITAL_COMMUNITY): Payer: Self-pay | Admitting: *Deleted

## 2013-03-04 DIAGNOSIS — I5033 Acute on chronic diastolic (congestive) heart failure: Secondary | ICD-10-CM

## 2013-03-04 DIAGNOSIS — I2510 Atherosclerotic heart disease of native coronary artery without angina pectoris: Secondary | ICD-10-CM

## 2013-03-04 DIAGNOSIS — I209 Angina pectoris, unspecified: Secondary | ICD-10-CM | POA: Diagnosis not present

## 2013-03-04 DIAGNOSIS — I509 Heart failure, unspecified: Secondary | ICD-10-CM | POA: Diagnosis not present

## 2013-03-04 LAB — CBC
HCT: 36.8 % (ref 36.0–46.0)
Hemoglobin: 12.1 g/dL (ref 12.0–15.0)
MCH: 27 pg (ref 26.0–34.0)
MCHC: 32.9 g/dL (ref 30.0–36.0)
MCV: 82.1 fL (ref 78.0–100.0)
RBC: 4.48 MIL/uL (ref 3.87–5.11)

## 2013-03-04 LAB — BASIC METABOLIC PANEL
BUN: 16 mg/dL (ref 6–23)
CO2: 27 mEq/L (ref 19–32)
Calcium: 8.8 mg/dL (ref 8.4–10.5)
Creatinine, Ser: 1.05 mg/dL (ref 0.50–1.10)
GFR calc non Af Amer: 52 mL/min — ABNORMAL LOW (ref >90)
Glucose, Bld: 111 mg/dL — ABNORMAL HIGH (ref 70–99)
Sodium: 137 mEq/L (ref 135–145)

## 2013-03-04 MED ORDER — PRAVASTATIN SODIUM 40 MG PO TABS
40.0000 mg | ORAL_TABLET | Freq: Every day | ORAL | Status: DC
  Filled 2013-03-04: qty 1

## 2013-03-04 MED ORDER — DILTIAZEM HCL ER COATED BEADS 120 MG PO CP24
120.0000 mg | ORAL_CAPSULE | Freq: Every day | ORAL | Status: DC
  Administered 2013-03-04 – 2013-03-05 (×2): 120 mg via ORAL
  Filled 2013-03-04 (×2): qty 1

## 2013-03-04 MED ORDER — METOPROLOL TARTRATE 25 MG PO TABS
25.0000 mg | ORAL_TABLET | Freq: Two times a day (BID) | ORAL | Status: DC
  Administered 2013-03-04 – 2013-03-05 (×3): 25 mg via ORAL
  Filled 2013-03-04 (×4): qty 1

## 2013-03-04 MED ORDER — LISINOPRIL 5 MG PO TABS
5.0000 mg | ORAL_TABLET | Freq: Every day | ORAL | Status: DC
  Administered 2013-03-04 – 2013-03-05 (×2): 5 mg via ORAL
  Filled 2013-03-04 (×2): qty 1

## 2013-03-04 MED ORDER — PANTOPRAZOLE SODIUM 40 MG PO TBEC
40.0000 mg | DELAYED_RELEASE_TABLET | Freq: Every day | ORAL | Status: DC
  Administered 2013-03-04 – 2013-03-05 (×2): 40 mg via ORAL
  Filled 2013-03-04 (×2): qty 1

## 2013-03-04 MED ORDER — ATORVASTATIN CALCIUM 40 MG PO TABS
40.0000 mg | ORAL_TABLET | Freq: Every day | ORAL | Status: DC
  Administered 2013-03-04: 19:00:00 40 mg via ORAL
  Filled 2013-03-04 (×2): qty 1

## 2013-03-04 MED ORDER — ALUM & MAG HYDROXIDE-SIMETH 200-200-20 MG/5ML PO SUSP
30.0000 mL | ORAL | Status: DC | PRN
  Administered 2013-03-04: 30 mL via ORAL
  Filled 2013-03-04: qty 30

## 2013-03-04 MED FILL — Sodium Chloride IV Soln 0.9%: INTRAVENOUS | Qty: 50 | Status: AC

## 2013-03-04 NOTE — Progress Notes (Signed)
CARDIAC REHAB PHASE I   PRE:  Rate/Rhythm: 53SB  BP:  Supine: 113/64  Sitting:   Standing:    SaO2: 94%RA  MODE:  Ambulation: 100 ft   POST:  Rate/Rhythm: 72SR  BP:  Supine:   Sitting: 148/70  Standing:    SaO2: 93%RA [phone] Pt stated very weak and not able to do much. Pt able to walk 100 ft on RA with rolling walker and asst x 1. Gait steady. C/o left knee weak. "bone on bone." to recliner after walk. No CP but stated slightly SOB. Feet very edematous and put on pillow to elevate. Call bell in reach. Set up for breakfast. Pt has had many tragic events in life and discussed these.emotional support given. Not able to do much ed. Reviewed stent and plavix. Will need to do ed in small amounts so pt will comprehend.    Luetta Nutting, RN BSN  03/04/2013 8:51 AM

## 2013-03-04 NOTE — Progress Notes (Signed)
Patient ID: Sharon Compton, female   DOB: 04-12-1941, 72 y.o.   MRN: [id number]    SUBJECTIVE: No chest pain.  Gets dyspneic with walking but actually did pretty well with rehab.  BP soft today.   [name] aspirin EC  81 mg Oral Daily  . atorvastatin  40 mg Oral q1800  . clopidogrel  75 mg Oral Q breakfast  . diltiazem  120 mg Oral Daily  . furosemide  80 mg Intravenous Q8H  . gabapentin  400 mg Oral QID  . iron polysaccharides  150 mg Oral Daily  . lisinopril  5 mg Oral Daily  . metoprolol  25 mg Oral BID  . potassium chloride SA  20 mEq Oral Daily     Filed Vitals:   03/04/13 0100 03/04/13 0500 03/04/13 0800 03/04/13 0833  BP: 101/35 96/43  148/70  Pulse: 52 59 58 55  Temp:  97.6 F (36.4 C)  97.7 F (36.5 C)  TempSrc:  Oral  Oral  Resp:  20  18  Height:      Weight:  93.7 kg (206 lb 9.1 oz)    SpO2: 90% 95% 92% 92%    Intake/Output Summary (Last 24 hours) at 03/04/13 0842 Last data filed at 03/04/13 0555  Gross per 24 hour  Intake    240 ml  Output   2475 ml  Net  -2235 ml    LABS: Basic Metabolic Panel:  Recent Labs  40/98/11 0635  NA 137  K 4.6  CL 99  CO2 27  GLUCOSE 111*  BUN 16  CREATININE 1.05  CALCIUM 8.8   Liver Function Tests: No results found for this basename: AST, ALT, ALKPHOS, BILITOT, PROT, ALBUMIN,  in the last 72 hours No results found for this basename: LIPASE, AMYLASE,  in the last 72 hours CBC:  Recent Labs  03/04/13 0635  WBC 10.6*  HGB 12.1  HCT 36.8  MCV 82.1  PLT 207   RADIOLOGY: No results found.  PHYSICAL EXAM General: NAD Neck: JVP difficult, no thyromegaly or thyroid nodule.  Lungs: Slight crackles at bases.  CV: Nondisplaced PMI.  Heart regular S1/S2, no S3/S4, no murmur.  2+ edema in feet and 1/2 up lower legs.  Looks to be a component of lymphedema.  No carotid bruit.   Abdomen: Soft, nontender, no hepatosplenomegaly, no distention.  Neurologic: Alert and oriented x 3.  Psych: Normal affect. Extremities: No  clubbing or cyanosis.   TELEMETRY: Reviewed telemetry pt in NSR  ASSESSMENT AND PLAN: 72 yo with history of PAF, GI bleed, chronic diastolic CHF with chronic exertional dyspnea had RHC/LHC yesterday and had BMS to distal RCA.  1. CAD: Stable, no chest pain.  BMS to distal RCA yesterday.  Continue ASA, Plavix.  Will change pravastatin to atorvastatin 40.  Cardiac rehab as outpatient. 2. Acute on chronic diastolic CHF: She has a lot of lower extremity edema and likely a component of lymphedema.  RA pressure and PCWP mild to moderately elevated on RHC yesterday.  - Continue Lasix 80 mg IV tid today.  However, do not think we will be able to get all of the swelling off given probable lymphedema component.   - Compression stockings.  3. Low blood pressure: Pressure running low overnight.  Will cut doses of diltiazem, metoprolol, and lisinopril and follow.  4. PAF: In NSR here.  Not anticoagulated with GI bleeding history.    Marca Ancona 03/04/2013 8:48 AM    @ME1 @

## 2013-03-05 DIAGNOSIS — I5033 Acute on chronic diastolic (congestive) heart failure: Secondary | ICD-10-CM | POA: Diagnosis not present

## 2013-03-05 DIAGNOSIS — E876 Hypokalemia: Secondary | ICD-10-CM | POA: Diagnosis not present

## 2013-03-05 DIAGNOSIS — I2510 Atherosclerotic heart disease of native coronary artery without angina pectoris: Secondary | ICD-10-CM | POA: Diagnosis present

## 2013-03-05 DIAGNOSIS — I509 Heart failure, unspecified: Secondary | ICD-10-CM | POA: Diagnosis not present

## 2013-03-05 DIAGNOSIS — I209 Angina pectoris, unspecified: Secondary | ICD-10-CM | POA: Diagnosis present

## 2013-03-05 LAB — BASIC METABOLIC PANEL
BUN: 22 mg/dL (ref 6–23)
Calcium: 8.9 mg/dL (ref 8.4–10.5)
Creatinine, Ser: 1.3 mg/dL — ABNORMAL HIGH (ref 0.50–1.10)
GFR calc Af Amer: 46 mL/min — ABNORMAL LOW (ref >90)
GFR calc non Af Amer: 40 mL/min — ABNORMAL LOW (ref >90)
Glucose, Bld: 111 mg/dL — ABNORMAL HIGH (ref 70–99)
Potassium: 3.1 mEq/L — ABNORMAL LOW (ref 3.5–5.1)

## 2013-03-05 MED ORDER — CLOPIDOGREL BISULFATE 75 MG PO TABS: 75.0000 mg | ORAL_TABLET | Freq: Every day | ORAL | Status: DC

## 2013-03-05 MED ORDER — DILTIAZEM HCL ER COATED BEADS 120 MG PO CP24: 120.0000 mg | ORAL_CAPSULE | Freq: Every day | ORAL | Status: DC

## 2013-03-05 MED ORDER — LISINOPRIL 5 MG PO TABS: 5.0000 mg | ORAL_TABLET | Freq: Every day | ORAL | Status: DC

## 2013-03-05 MED ORDER — METOPROLOL TARTRATE 25 MG PO TABS: 25.0000 mg | ORAL_TABLET | Freq: Two times a day (BID) | ORAL | Status: DC

## 2013-03-05 MED ORDER — POTASSIUM CHLORIDE CRYS ER 20 MEQ PO TBCR
40.0000 meq | EXTENDED_RELEASE_TABLET | Freq: Once | ORAL | Status: AC
  Administered 2013-03-05: 16:00:00 40 meq via ORAL
  Filled 2013-03-05: qty 2

## 2013-03-05 MED ORDER — ATORVASTATIN CALCIUM 40 MG PO TABS: 40.0000 mg | ORAL_TABLET | Freq: Every day | ORAL | Status: DC

## 2013-03-05 NOTE — Discharge Summary (Signed)
CARDIOLOGY DISCHARGE SUMMARY   Patient ID: YOALI CONRY MRN: [id number] DOB/AGE: [birth year] 72 y.o.  Admit date: 03/03/2013 Discharge date: 03/05/2013  Primary Discharge Diagnosis:    Angina, class III  Secondary Discharge Diagnosis:    HTN (hypertension), benign   Acute on Chronic diastolic CHF (congestive heart failure), NYHA class 3   CAD (coronary artery disease), native coronary artery   Hypokalemia  Procedure Performed:  1. Left Heart Catheterization 2. Selective Coronary Angiography 3. Right Heart Catheterization 4. Left ventricular angiogram 5. PTCA/bare metal stent x 1 distal RCA  Hospital Course: QUEEN ABBETT is a 72 y.o. female with a history of CHF and PAF. Not anticoagulated secondary to GI bleed. She was evaluated by Dr. Clifton James and a right/left heart cath was recommended to guide therapy. She came to the hospital for this on 9/92014.   The results are below. She had a BMS to the RCA and tolerated the procedure well. She had some elevation in her right heart pressures and was started on IV Lasix. She diuresed over 3 liters during her stay. She had some problems with hypokalemia, but was supplemented. Her edema and SOB improved. By discharge, her oxygen saturations were 94% on room air.   On 03/05/2013, Ms. Candee was seen by Dr. Gala Romney. She was also seen by cardiac rehab. She had some DOE with ambulation but was improved from admission. She had no chest pain. Dr. Gala Romney reviewed all data and felt Ms. Lansdowne was improved and was appropriate for discharge, to follow up as an outpatient.    Labs:  Lab Results  Component Value Date   WBC 10.6* 03/04/2013   HGB 12.1 03/04/2013   HCT 36.8 03/04/2013   MCV 82.1 03/04/2013   PLT 207 03/04/2013     Recent Labs Lab 03/05/13 0515  NA 140  K 3.1*  CL 99  CO2 35*  BUN 22  CREATININE 1.30*  CALCIUM 8.9  GLUCOSE 111*      Cardiac Cath: 03/03/2013 Hemodynamic Findings:  Ao: 187/87  LV: 178/15/26  RA: 10    RV: 40/11/15  PA: 35/20 (mean 27)  PCWP: 22  Fick Cardiac Output: 5.91 L/min  Fick Cardiac Index: 3.13 L/min/m2  Central Aortic Saturation: 88%  Pulmonary Artery Saturation: 65%  Angiographic Findings:  Left main: No obstructive disease.  Left Anterior Descending Artery: Large caliber vessel that courses to the apex. The mid vessel has a 30% stenosis. The first diagonal branch has mild plaque. The second diagonal branch is small to moderate in caliber with 50% ostial stenosis.  Circumflex Artery: Moderate caliber vessel with no obstructive disease.  Right Coronary Artery: Large dominant vessel with serial 30% proximal and mid stenoses. The distal vessel has a 99% stenosis followed by a 80% stenosis just before the bifurcation. The Posterolateral branch is the larger of the two distal branches and has mild diffuse plaque. The PDA is small in caliber with 40% ostial stenosis.  Left Ventricular Angiogram:LVEF=65-70%  Impression:  1. Severe single vessel CAD presenting with dyspnea on exertion/chest pain c/w class III angina  2. Abnormal stress test with evidence of ischemia  3. Preserved LV systolic function  4. Successful PTCA/bare metal stent x 1 distal RCA  Recommendations: She will need ASA and Plavix for at least one month but for one year if she tolerates.   FOLLOW UP PLANS AND APPOINTMENTS Allergies  Allergen Reactions  . Dye Fdc Red [Red Dye]   . Sulfa Antibiotics Itching  . Codeine  Itching and Palpitations     Medication List    STOP taking these medications       pravastatin 40 MG tablet  Commonly known as:  PRAVACHOL     predniSONE 20 MG tablet  Commonly known as:  DELTASONE      TAKE these medications       ALPRAZolam 1 MG tablet  Commonly known as:  XANAX  Take 1 mg by mouth 4 (four) times daily as needed for anxiety.     aspirin EC 81 MG tablet  Take 81 mg by mouth daily.     atorvastatin 40 MG tablet  Commonly known as:  LIPITOR  Take 1 tablet (40 mg  total) by mouth daily at 6 PM.     clopidogrel 75 MG tablet  Commonly known as:  PLAVIX  Take 1 tablet (75 mg total) by mouth daily with breakfast.     diltiazem 120 MG 24 hr capsule  Commonly known as:  CARDIZEM CD  Take 1 capsule (120 mg total) by mouth daily.     fish oil-omega-3 fatty acids 1000 MG capsule  Take 1 g by mouth daily.     furosemide 40 MG tablet  Commonly known as:  LASIX  Take 2 tablets (80 mg total) by mouth 2 (two) times daily.     gabapentin 400 MG capsule  Commonly known as:  NEURONTIN  Take 400 mg by mouth 4 (four) times daily.     iron polysaccharides 150 MG capsule  Commonly known as:  NIFEREX  Take 150 mg by mouth daily.     lisinopril 5 MG tablet  Commonly known as:  PRINIVIL,ZESTRIL  Take 1 tablet (5 mg total) by mouth daily.     metoprolol tartrate 25 MG tablet  Commonly known as:  LOPRESSOR  Take 1 tablet (25 mg total) by mouth 2 (two) times daily.     potassium chloride SA 20 MEQ tablet  Commonly known as:  K-DUR,KLOR-CON  Take 1 tablet (20 mEq total) by mouth daily.     PROTONIX PO  Take 40 mg by mouth daily.        Discharge Orders   Future Appointments Provider Department Dept Phone   03/19/2013 1:50 PM Jodelle Gross, NP [facility] [place] [phone]   Future Orders Complete By Expires   (HEART FAILURE PATIENTS) Call MD:  Anytime you have any of the following symptoms: 1) 3 pound weight gain in 24 hours or 5 pounds in 1 week 2) shortness of breath, with or without a dry hacking cough 3) swelling in the hands, feet or stomach 4) if you have to sleep on extra pillows at night in order to breathe.  As directed    Diet - low sodium heart healthy  As directed    Increase activity slowly  As directed      Follow-up Information   Follow up with Joni Reining, NP On 03/19/2013. (at 1:50 pm. Lab work that day also.)    Specialty:  Programmer, multimedia information:   [address] [place]. [place]  [address] [phone]       BRING ALL MEDICATIONS WITH YOU TO FOLLOW UP APPOINTMENTS  Time spent with patient to include physician time: 36 min Signed: Theodore Demark, PA-C 03/05/2013, 3:47 PM Co-Sign MD

## 2013-03-05 NOTE — Progress Notes (Signed)
Patient ID: Sharon Compton, female   DOB: 1941-03-03, 72 y.o.   MRN: [id number]    SUBJECTIVE:   Weight down 4 pounds over night. Breathing better. Anxious to go home. K+ 3.1   . aspirin EC  81 mg Oral Daily  . atorvastatin  40 mg Oral q1800  . clopidogrel  75 mg Oral Q breakfast  . diltiazem  120 mg Oral Daily  . furosemide  80 mg Intravenous Q8H  . gabapentin  400 mg Oral QID  . iron polysaccharides  150 mg Oral Daily  . lisinopril  5 mg Oral Daily  . metoprolol  25 mg Oral BID  . pantoprazole  40 mg Oral Q1200  . potassium chloride SA  20 mEq Oral Daily     Filed Vitals:   03/05/13 0530 03/05/13 0540 03/05/13 0806 03/05/13 1300  BP: 110/51 110/51 110/59 114/52  Pulse: 66  73 65  Temp: 98.2 F (36.8 C)  97.6 F (36.4 C) 97.7 F (36.5 C)  TempSrc: Oral  Oral Oral  Resp: 20  20 18   Height:      Weight:      SpO2: 92%  94% 94%    Intake/Output Summary (Last 24 hours) at 03/05/13 1406 Last data filed at 03/05/13 1300  Gross per 24 hour  Intake   1440 ml  Output   2400 ml  Net   -960 ml    LABS: Basic Metabolic Panel:  Recent Labs  30/86/57 0635 03/05/13 0515  NA 137 140  K 4.6 3.1*  CL 99 99  CO2 27 35*  GLUCOSE 111* 111*  BUN 16 22  CREATININE 1.05 1.30*  CALCIUM 8.8 8.9   Liver Function Tests: No results found for this basename: AST, ALT, ALKPHOS, BILITOT, PROT, ALBUMIN,  in the last 72 hours No results found for this basename: LIPASE, AMYLASE,  in the last 72 hours CBC:  Recent Labs  03/04/13 0635  WBC 10.6*  HGB 12.1  HCT 36.8  MCV 82.1  PLT 207   RADIOLOGY: No results found.  PHYSICAL EXAM General: NAD Neck: JVP difficult, no thyromegaly or thyroid nodule.  Lungs: Slight crackles at bases.  CV: Nondisplaced PMI.  Heart regular S1/S2, no S3/S4, no murmur.  2+ edema in feet and 1/2 up lower legs.  Looks to be a component of lymphedema.  No carotid bruit.   Abdomen: Soft, nontender, no hepatosplenomegaly, no distention.  Neurologic:  Alert and oriented x 3.  Psych: Normal affect. Extremities: No clubbing or cyanosis.   TELEMETRY: Reviewed telemetry pt in NSR  ASSESSMENT AND PLAN: 72 yo with history of PAF, GI bleed, chronic diastolic CHF with chronic exertional dyspnea had RHC/LHC yesterday and had BMS to distal RCA.  1. CAD: Stable, no chest pain.  BMS to distal RCA this admit Continue ASA, Plavix.  Will change pravastatin to atorvastatin 40.  Cardiac rehab as outpatient. 2. Acute on chronic diastolic CHF: Volume status much improved. She likely has a component of lymphedema.  - Compression stockings.  3. Hypokalemia - will sup 4. PAF: In NSR here.  Not anticoagulated with GI bleeding history.   5. Obesity  Ok for d/c today. Volume status improved. Continue home regimen of 80 po bid with kcl 20. If still struggling with volume overload can switch to torsemide as outpatient. Needs f/u with PCP and [facility]. Need to be compliant with Plavix has been stressed. Consider Cardiac Rehab referral.   [name] 03/05/2013 2:06 PM

## 2013-03-05 NOTE — Progress Notes (Signed)
CARDIAC REHAB PHASE I   PRE:  Rate/Rhythm: 63 SR    BP: sitting 105/48    SaO2:   MODE:  Ambulation: 210 ft   POST:  Rate/Rhythm: 90 SR    BP: sitting 110/59     SaO2: 95 RA  Pt asleep upon entering, sts she did not sleep last night. Encouraged to walk. Able to walk independently with RW, fairly steady. C/o fatigue about 150 ft. And returned to room. Vss, to recliner. Reviewed ed, gave diet sheet. Pt sts she knows all about diet and NTG. Encouraged to increase ex tolerance by walking x3 qd in house or driveway. Pt became irritated by her instructions. Pt sts she has a RW at home. Will f/u tomorrow for ambulation if no d/c. [phone]   Elissa Lovett Garland CES, ACSM 03/05/2013 8:21 AM

## 2013-03-06 IMAGING — CR DG CHEST 2V
2 series · 2 of 2 positions shown · non-contrast
Comparison: 06/24/2011

CLINICAL DATA: Productive cough

CHEST - 2 VIEW

[view not recorded (1 of 2)]
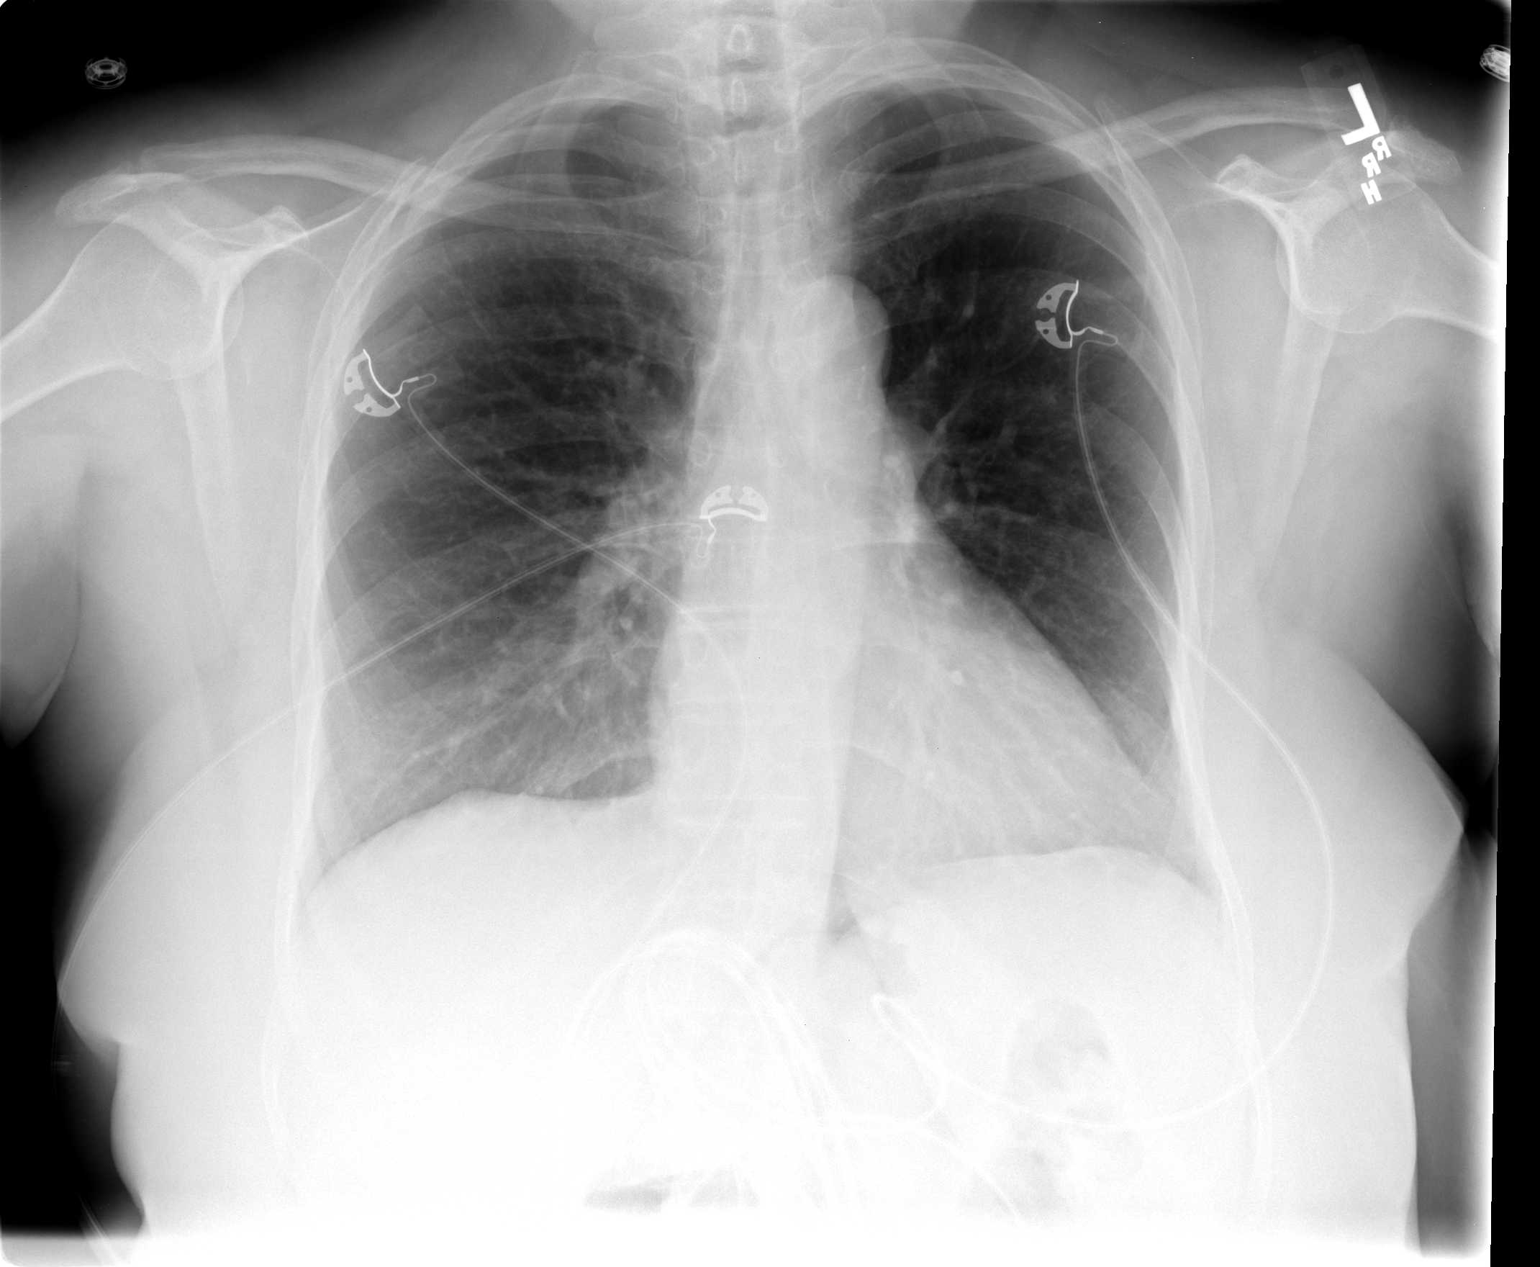

[view not recorded (2 of 2)]
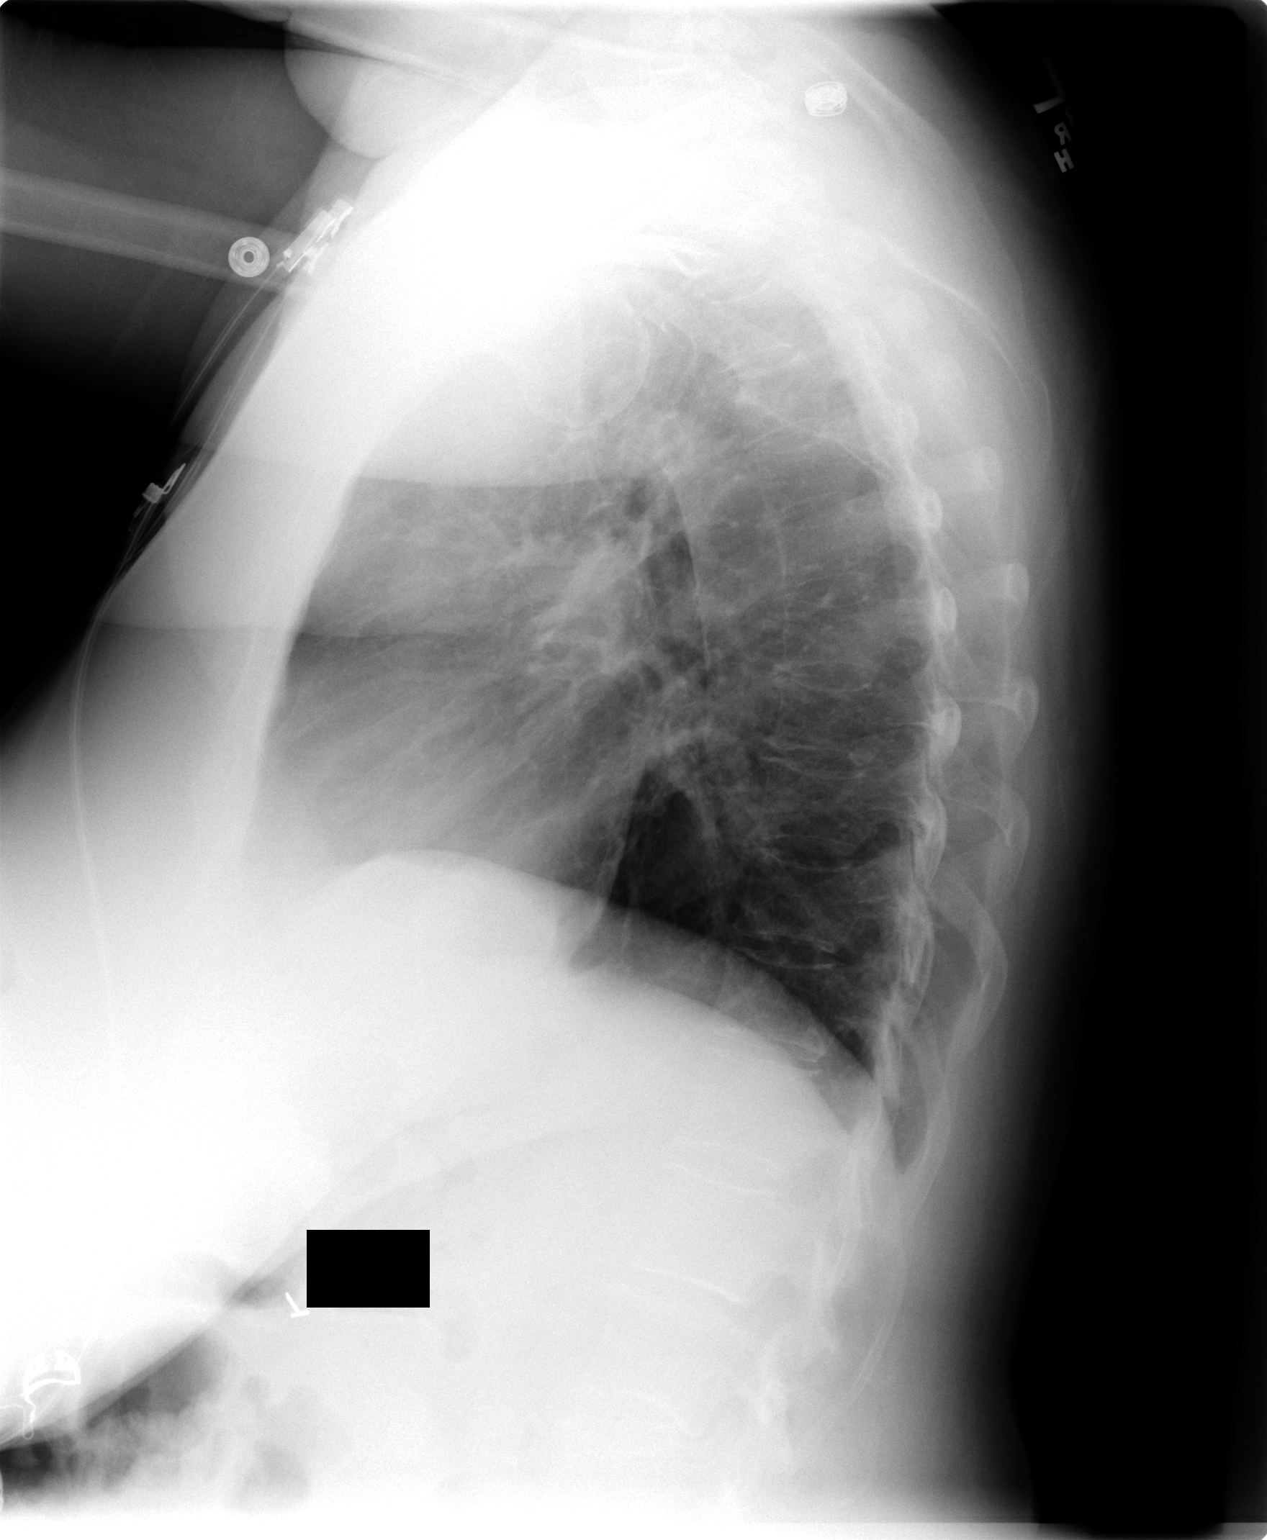

[2 of 2 positions shown; findings below may reference images not displayed]

FINDINGS: Cardiomediastinal silhouette is stable.  No acute
infiltrate or pleural effusion.  No pulmonary edema.  Bony thorax
is stable.
IMPRESSION: No active disease.  No significant change.

## 2013-03-09 ENCOUNTER — Other Ambulatory Visit: Payer: Self-pay | Admitting: Nurse Practitioner

## 2013-03-11 NOTE — Telephone Encounter (Signed)
Called in.

## 2013-03-11 NOTE — Telephone Encounter (Signed)
Last filled 02/10/13, last seen 11/03/12, if approved, route to pool B so nurse can call into [facility]

## 2013-03-11 NOTE — Telephone Encounter (Signed)
Please call in xanax rx with 1 refill 

## 2013-03-19 ENCOUNTER — Encounter: Payer: Self-pay | Admitting: Adult Health

## 2013-03-19 ENCOUNTER — Ambulatory Visit (INDEPENDENT_AMBULATORY_CARE_PROVIDER_SITE_OTHER): Payer: Medicare Other | Admitting: Adult Health

## 2013-03-19 VITALS — BP 128/80 | HR 60 | Ht 60.0 in | Wt 205.0 lb

## 2013-03-19 DIAGNOSIS — I2510 Atherosclerotic heart disease of native coronary artery without angina pectoris: Secondary | ICD-10-CM

## 2013-03-19 DIAGNOSIS — I5033 Acute on chronic diastolic (congestive) heart failure: Secondary | ICD-10-CM | POA: Diagnosis not present

## 2013-03-19 DIAGNOSIS — I10 Essential (primary) hypertension: Secondary | ICD-10-CM

## 2013-03-19 DIAGNOSIS — I509 Heart failure, unspecified: Secondary | ICD-10-CM | POA: Diagnosis not present

## 2013-03-19 NOTE — Progress Notes (Deleted)
Name: Sharon Compton    DOB: [birth year]  Age: 72 y.o.  MR#: [id number]       PCP:  Rudi Heap, MD      Insurance: Payor: MEDICARE / Plan: MEDICARE PART A AND B / Product Type: *No Product type* /   CC:    Chief Complaint  Patient presents with  . Congestive Heart Failure    Diastolic  . Atrial Fibrillation    VS Filed Vitals:   03/19/13 1339  BP: 128/80  Pulse: 60  Height: 5' (1.524 m)  Weight: 205 lb (92.987 kg)    Weights Current Weight  03/19/13 205 lb (92.987 kg)  03/05/13 202 lb 2.6 oz (91.7 kg)  03/05/13 202 lb 2.6 oz (91.7 kg)    Blood Pressure  BP Readings from Last 3 Encounters:  03/19/13 128/80  03/05/13 114/52  03/05/13 114/52     Admit date:  (Not on file) Last encounter with RMR:  Visit date not found   Allergy Dye fdc red; Sulfa antibiotics; and Codeine  Current Outpatient Prescriptions  Medication Sig Dispense Refill  . ALPRAZolam (XANAX) 1 MG tablet TAKE ONE TABLET BY MOUTH 4 TIMES DAILY AS NEEDED FOR ANXIETY  120 tablet  0  . aspirin EC 81 MG tablet Take 81 mg by mouth daily.      [name] atorvastatin (LIPITOR) 40 MG tablet Take 1 tablet (40 mg total) by mouth daily at 6 PM.  30 tablet  11  . ciprofloxacin (CIPRO) 500 MG tablet Take 500 mg by mouth 2 (two) times daily.      . clopidogrel (PLAVIX) 75 MG tablet Take 1 tablet (75 mg total) by mouth daily with breakfast.  30 tablet  11  . diltiazem (CARDIZEM CD) 120 MG 24 hr capsule Take 1 capsule (120 mg total) by mouth daily.  30 capsule  5  . fish oil-omega-3 fatty acids 1000 MG capsule Take 1 g by mouth daily.      . furosemide (LASIX) 40 MG tablet Take 2 tablets (80 mg total) by mouth 2 (two) times daily.  120 tablet  3  . gabapentin (NEURONTIN) 400 MG capsule Take 400 mg by mouth 4 (four) times daily.      . iron polysaccharides (NIFEREX) 150 MG capsule Take 150 mg by mouth daily.      [name] lisinopril (PRINIVIL,ZESTRIL) 5 MG tablet Take 1 tablet (5 mg total) by mouth daily.  30 tablet  6  . metoprolol  (LOPRESSOR) 25 MG tablet Take 1 tablet (25 mg total) by mouth 2 (two) times daily.  60 tablet  11  . nitroGLYCERIN (NITROSTAT) 0.4 MG SL tablet Place 0.4 mg under the tongue every 5 (five) minutes as needed for chest pain.      . Pantoprazole Sodium (PROTONIX PO) Take 40 mg by mouth daily.       . potassium chloride SA (K-DUR,KLOR-CON) 20 MEQ tablet Take 1 tablet (20 mEq total) by mouth daily.  30 tablet  3   No current facility-administered medications for this visit.    Discontinued Meds:   There are no discontinued medications.  Patient Active Problem List   Diagnosis Date Noted  . Acute on chronic diastolic CHF (congestive heart failure), NYHA class 3 03/05/2013  . CAD (coronary artery disease), native coronary artery 03/05/2013  . Angina, class III 03/05/2013  . Hypokalemia 03/05/2013  . HTN (hypertension), benign 09/27/2012  . Anxiety and depression 09/27/2012  . Hyperglycemia 10/03/2011  . MI, acute, non  ST segment elevation 10/02/2011  . Asthma 10/01/2011  . Campath-induced atrial fibrillation 10/01/2011  . 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 Diverticulosis 10/01/2011  . Peripheral edema 08/23/2011  . Elevated LFTs 08/22/2011  . Anxiety disorder 08/21/2011  . GI bleed 08/20/2011  . Acute blood loss anemia 08/20/2011  . Obesity 08/20/2011    LABS    Component Value Date/Time   NA 140 03/05/2013 0515   NA 137 03/04/2013 0635   NA 140 02/26/2013 1511   K 3.1* 03/05/2013 0515   K 4.6 03/04/2013 0635   K 4.1 02/26/2013 1511   CL 99 03/05/2013 0515   CL 99 03/04/2013 0635   CL 102 02/26/2013 1511   CO2 35* 03/05/2013 0515   CO2 27 03/04/2013 0635   CO2 33* 02/26/2013 1511   GLUCOSE 111* 03/05/2013 0515   GLUCOSE 111* 03/04/2013 0635   GLUCOSE 94 02/26/2013 1511   BUN 22 03/05/2013 0515   BUN 16 03/04/2013 0635   BUN 20 02/26/2013 1511   CREATININE 1.30* 03/05/2013 0515   CREATININE 1.05 03/04/2013 0635   CREATININE 1.1 02/26/2013 1511   CALCIUM 8.9 03/05/2013 0515    CALCIUM 8.8 03/04/2013 0635   CALCIUM 9.2 02/26/2013 1511   GFRNONAA 40* 03/05/2013 0515   GFRNONAA 52* 03/04/2013 0635   GFRNONAA 68* 10/06/2011 0512   GFRAA 46* 03/05/2013 0515   GFRAA 60* 03/04/2013 0635   GFRAA 79* 10/06/2011 0512   CMP     Component Value Date/Time   NA 140 03/05/2013 0515   K 3.1* 03/05/2013 0515   CL 99 03/05/2013 0515   CO2 35* 03/05/2013 0515   GLUCOSE 111* 03/05/2013 0515   BUN 22 03/05/2013 0515   CREATININE 1.30* 03/05/2013 0515   CALCIUM 8.9 03/05/2013 0515   PROT 7.4 09/25/2012 1435   ALBUMIN 3.8 09/25/2012 1435   AST 18 09/25/2012 1435   ALT 14 09/25/2012 1435   ALKPHOS 91 09/25/2012 1435   BILITOT 0.3 09/25/2012 1435   GFRNONAA 40* 03/05/2013 0515   GFRAA 46* 03/05/2013 0515       Component Value Date/Time   WBC 10.6* 03/04/2013 0635   WBC 7.8 02/26/2013 1511   WBC 7.2 09/25/2012 1435   HGB 12.1 03/04/2013 0635   HGB 13.6 02/26/2013 1511   HGB 12.9 09/25/2012 1435   HCT 36.8 03/04/2013 0635   HCT 41.1 02/26/2013 1511   HCT 39.0 09/25/2012 1435   MCV 82.1 03/04/2013 0635   MCV 81.2 02/26/2013 1511   MCV 80.4 09/25/2012 1435    Lipid Panel     Component Value Date/Time   CHOL 200 10/02/2011 0826   TRIG 111 10/02/2011 0826   HDL 47 10/02/2011 0826   CHOLHDL 4.3 10/02/2011 0826   VLDL 22 10/02/2011 0826   LDLCALC 131* 10/02/2011 0826    ABG    Component Value Date/Time   PHART 7.377 03/03/2013 1228   PCO2ART 46.8* 03/03/2013 1228   PO2ART 56.0* 03/03/2013 1228   HCO3 27.5* 03/03/2013 1228   TCO2 29 03/03/2013 1228   ACIDBASEDEF 2.0 03/03/2013 1226   O2SAT 88.0 03/03/2013 1228     Lab Results  Component Value Date   TSH 2.53 09/25/2012   BNP (last 3 results) No results found for this basename: PROBNP,  in the last 8760 hours Cardiac Panel (last 3 results) No results found for this basename: CKTOTAL, CKMB, TROPONINI, RELINDX,  in the last 72 hours  Iron/TIBC/Ferritin No results found for this basename: iron, tibc,  ferritin     EKG Orders placed in visit on 03/19/13  . EKG 12-LEAD      Prior Assessment and Plan Problem List as of 03/19/2013     Cardiovascular and Mediastinum   Campath-induced atrial fibrillation   AVM (arteriovenous malformation) of colon   MI, acute, non ST segment elevation   HTN (hypertension), benign   Last Assessment & Plan   09/25/2012 Office Visit Written 09/27/2012  2:31 PM by Jeoffrey Massed, MD     Hx of chronic poor control despite multiple meds per pt report. Add clonidine TTS 0.3 mg/24, 1 patch q week. Obtain cardiology records.    Acute on chronic diastolic CHF (congestive heart failure), NYHA class 3   CAD (coronary artery disease), native coronary artery   Angina, class III     Respiratory   Asthma     Digestive   Diverticulosis   GI bleed     Other   Obesity   Anxiety disorder   Acute blood loss anemia   Elevated LFTs   Peripheral edema   Last Assessment & Plan   09/25/2012 Office Visit Written 09/27/2012  2:33 PM by Jeoffrey Massed, MD     Chronic LE venous insufficiency. Check labs and if lytes/cr ok then will titrate diuretic up a bit. Reviewed low Na diet, elevation of legs regularly.     Hyperglycemia   Anxiety and depression   Last Assessment & Plan   09/25/2012 Office Visit Written 09/27/2012  2:35 PM by Jeoffrey Massed, MD     Lifelong.  Long hx of poor functioning, emotional trauma, and poor coping skills. She swears she has not had any trial of antidepressant. Will do trial of paxil 20mg  qhs.  Therapeutic expectations and side effect profile of medication discussed today.  Patient's questions answered.     Hypokalemia       Imaging: No results found.

## 2013-03-19 NOTE — Assessment & Plan Note (Signed)
She is without complaint of chest pain or DOE. She is obese, so there is some component of mild dyspnea with exertion. She denies bleeding issues on DAPT. Will leave her on this as she is tolerating it so well. Will see her in 3 months unless symptomatic.

## 2013-03-19 NOTE — Patient Instructions (Signed)
Your physician recommends that you schedule a follow-up appointment in: 4 months You will receive a reminder letter two months in advance reminding you to call and schedule your appointment. If you don't receive this letter, please contact our office.  Your physician recommends that you continue on your current medications as directed. Please refer to the Current Medication list given to you today.   

## 2013-03-19 NOTE — Assessment & Plan Note (Signed)
She appears well compensated today. Lungs are clear and no over LEE, but she does have a lot of obesity in LE. She continues to diurese on Lasix with potassium replacement. She is avoiding salt.

## 2013-03-19 NOTE — Assessment & Plan Note (Signed)
Blood pressure is well controlled currently. Continue current regimen.

## 2013-03-19 NOTE — Progress Notes (Signed)
HPI: Sharon Compton is a 72 year old former patient of Dr. Dietrich Pates we are following for ongoing assessment and management of hypertension, acute on chronic diastolic CHF, CAD, and atrial fibrillation not a anticoagulation candidate secondary to GI bleed. The patient was seen in the hospital in April of 2014 with a followup appointment with Dr. Clifton James in early September. The patient was scheduled for a cardiac catheterization which was completed on 03/03/2013. This is a right and left heart catheterization demonstrating severe single-vessel CAD of the right coronary artery, with a 99% stenosis followed by an 80% stenosis just before the bifurcation. The patient had a successful PTCA/bare-metal stent x1. She was started on aspirin and Plavix for a minimum of 30 days for recommendations for one year if she is tolerating it. Most recent weight recorded was 207 pounds.  Allergies  Allergen Reactions  . Dye Fdc Red [Red Dye]   . Sulfa Antibiotics Itching  . Codeine Itching and Palpitations    Current Outpatient Prescriptions  Medication Sig Dispense Refill  . ALPRAZolam (XANAX) 1 MG tablet TAKE ONE TABLET BY MOUTH 4 TIMES DAILY AS NEEDED FOR ANXIETY  120 tablet  0  . aspirin EC 81 MG tablet Take 81 mg by mouth daily.      [name] atorvastatin (LIPITOR) 40 MG tablet Take 1 tablet (40 mg total) by mouth daily at 6 PM.  30 tablet  11  . ciprofloxacin (CIPRO) 500 MG tablet Take 500 mg by mouth 2 (two) times daily.      . clopidogrel (PLAVIX) 75 MG tablet Take 1 tablet (75 mg total) by mouth daily with breakfast.  30 tablet  11  . diltiazem (CARDIZEM CD) 120 MG 24 hr capsule Take 1 capsule (120 mg total) by mouth daily.  30 capsule  5  . fish oil-omega-3 fatty acids 1000 MG capsule Take 1 g by mouth daily.      . furosemide (LASIX) 40 MG tablet Take 2 tablets (80 mg total) by mouth 2 (two) times daily.  120 tablet  3  . gabapentin (NEURONTIN) 400 MG capsule Take 400 mg by mouth 4 (four) times daily.      .  iron polysaccharides (NIFEREX) 150 MG capsule Take 150 mg by mouth daily.      [name] lisinopril (PRINIVIL,ZESTRIL) 5 MG tablet Take 1 tablet (5 mg total) by mouth daily.  30 tablet  6  . metoprolol (LOPRESSOR) 25 MG tablet Take 1 tablet (25 mg total) by mouth 2 (two) times daily.  60 tablet  11  . nitroGLYCERIN (NITROSTAT) 0.4 MG SL tablet Place 0.4 mg under the tongue every 5 (five) minutes as needed for chest pain.      . Pantoprazole Sodium (PROTONIX PO) Take 40 mg by mouth daily.       . potassium chloride SA (K-DUR,KLOR-CON) 20 MEQ tablet Take 1 tablet (20 mEq total) by mouth daily.  30 tablet  3   No current facility-administered medications for this visit.    Past Medical History  Diagnosis Date  . Hypertension   . Anxiety and depression   . Asthma   . Fibromyalgia   . History of pneumonia   . Peripheral edema   . Peptic ulcer disease   . Hiatal hernia   . GERD (gastroesophageal reflux disease)   . A-fib     cardioversion and [facility]; pt reports DCCV 2014 at [facility] as well.  . 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 Diverticular disease 10/01/2011  .  MI, acute, non ST segment elevation 10/02/2011  . GI bleed     ?recurrent--therefore, no anticoagulant  . COPD (chronic obstructive pulmonary disease)     Past Surgical History  Procedure Laterality Date  . Abdominal hysterectomy  age 71    nonmalignant reason  . Cholecystectomy    . Abdominal exploration surgery    . Abd tumor removed      states was 10 lbs, benign  . Knee surgery    . Bladder stent    . Esophagogastroduodenoscopy  08/24/2011    Procedure: ESOPHAGOGASTRODUODENOSCOPY (EGD);  Surgeon: Corbin Ade, MD;  [facility]: [facility];  Service: Endoscopy;  Laterality: N/A;  give phenergan 12.5mg  iv 30 mins prior to procedure  . Colonoscopy  08/25/2011    Procedure: COLONOSCOPY;  Surgeon: Corbin Ade, MD;  Location: [facility];  Service: Endoscopy;  Laterality: N/A;  . Colonoscopy  10/03/2011     Procedure: COLONOSCOPY;  Surgeon: Corbin Ade, MD;  Location: [facility];  Service: Endoscopy;  Laterality: N/A;  NEEDS PHENERGAN 25 MG IV ON CALL  . Colonoscopy  10/04/2011    Procedure: COLONOSCOPY;  Surgeon: Corbin Ade, MD;  Location: [facility];  Service: Endoscopy;  Laterality: N/A;  Phenergan 12.5 mg ON CALL  . Transthoracic echocardiogram  09/2011    EF 60-65%, septal hypokinesia  . Cardiovascular stress test  09/2011    equivocal result, most likely low risk; pt refused the recommended cardiac cath to follow this up.    ROS: Review of systems complete and found to be negative unless listed above  PHYSICAL EXAM BP 128/80  Pulse 60  Ht 5' (1.524 m)  Wt 205 lb (92.987 kg)  BMI 40.04 kg/m2  General: Well developed, well nourished, in no acute distress Head: Eyes PERRLA, No xanthomas.   Normal cephalic and atramatic  Lungs: Clear bilaterally to auscultation and percussion. Heart: HRRR S1 S2,  1/6 systolic murmur.  Pulses are 2+ & equal.            No carotid bruit. No JVD.  No abdominal bruits. No femoral bruits. Abdomen: Bowel sounds are positive, abdomen soft and non-tender without masses or                  Hernia's noted. Msk:  Back normal, normal gait. Normal strength and tone for age. Extremities: No clubbing, cyanosis or, significant non-pitting edema. LE obesity.  DP +1 Neuro: Alert and oriented X 3. Psych:  Good affect, responds appropriately  EKG: NSR rate of 60 bpm.  ASSESSMENT AND PLAN

## 2013-03-20 ENCOUNTER — Telehealth: Payer: Self-pay | Admitting: Nurse Practitioner

## 2013-03-20 NOTE — Telephone Encounter (Signed)
Remus Loffler is not on her list will ntbs

## 2013-03-23 NOTE — Telephone Encounter (Signed)
Patient aware.

## 2013-04-18 IMAGING — CR DG CHEST 1V PORT
1 series · 1 of 1 positions shown · non-contrast
Comparison: 08/21/2011

CLINICAL DATA: Wheezing

PORTABLE CHEST - 1 VIEW

[view not recorded]
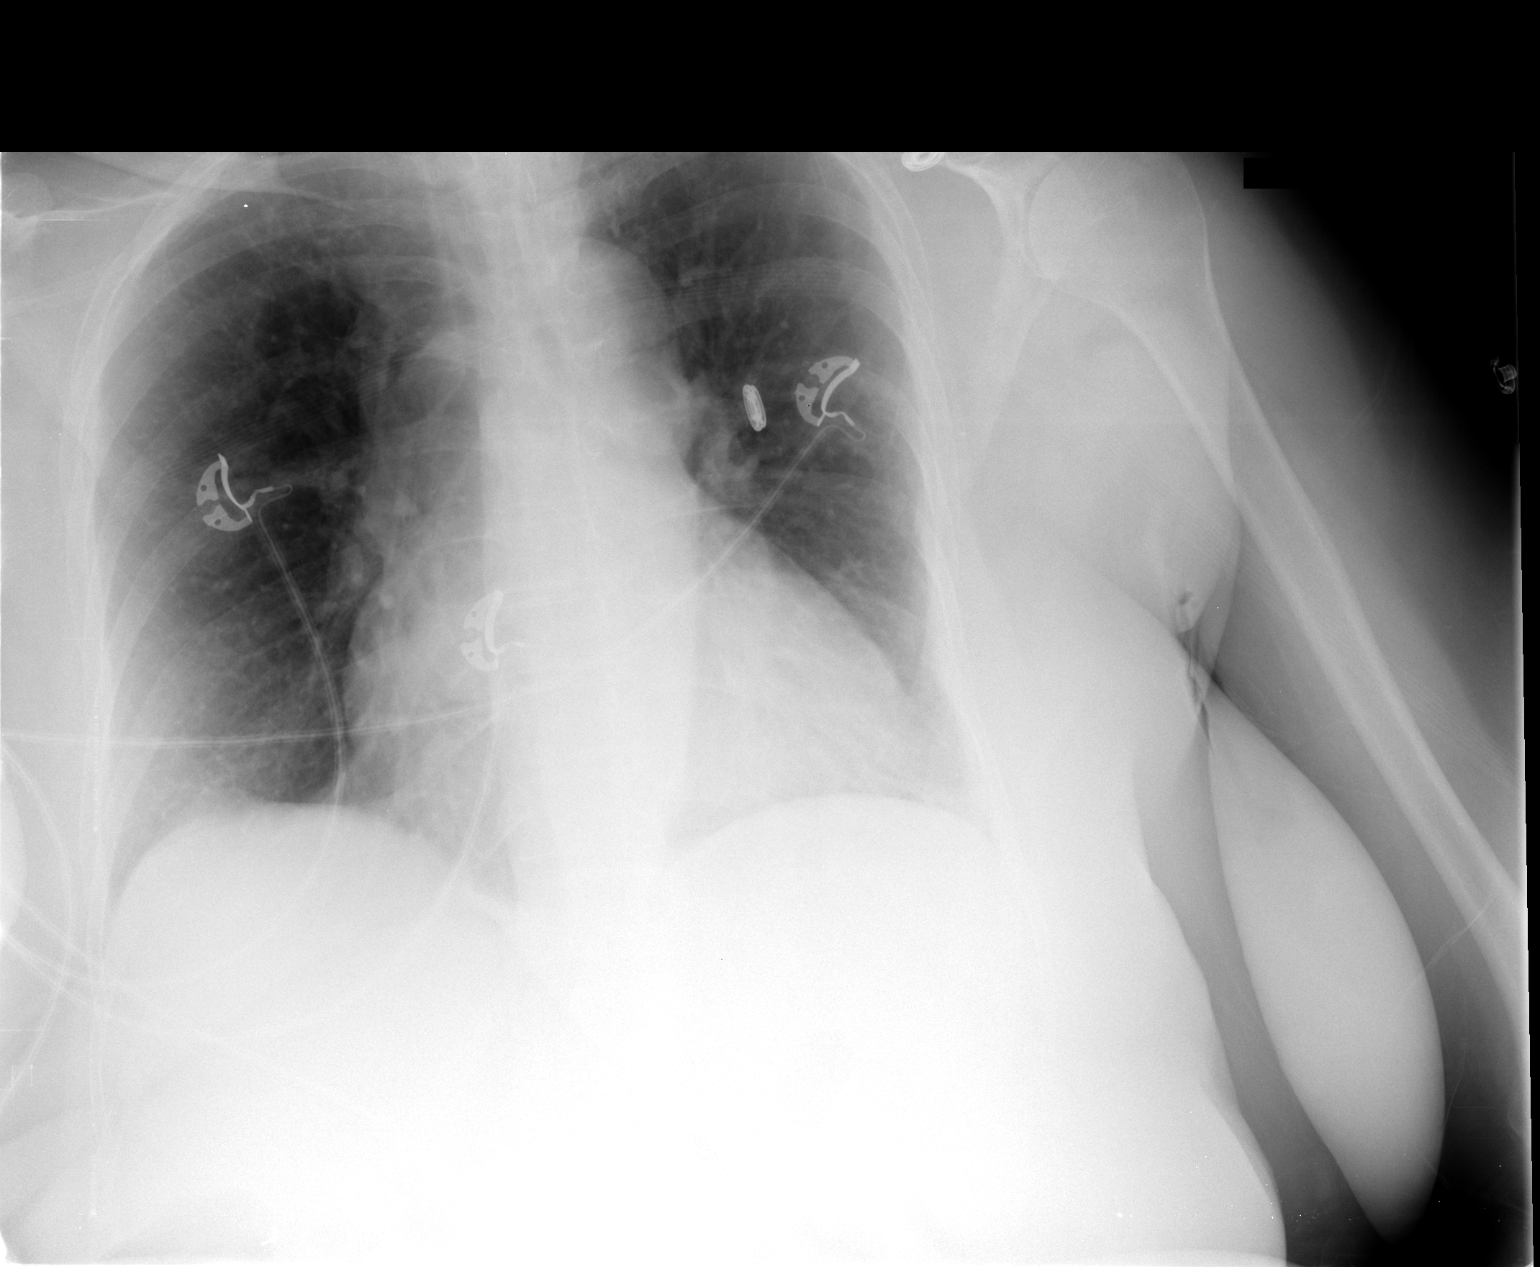

[1 of 1 positions shown; findings below may reference images not displayed]

FINDINGS: Numerous leads and wires project over the chest.  Midline
trachea.  Borderline cardiomegaly.  Minimal right hemidiaphragm
elevation. No pleural effusion or pneumothorax. Mildly low lung
volumes. Clear lungs.
IMPRESSION: Borderline cardiomegaly and low lung volumes. No acute findings.

## 2013-04-19 IMAGING — CR DG CHEST 1V PORT
1 series · 1 of 1 positions shown · non-contrast
Comparison: 10/03/2011

CLINICAL DATA: Right PICC line placement.

PORTABLE CHEST - 1 VIEW

[view not recorded]
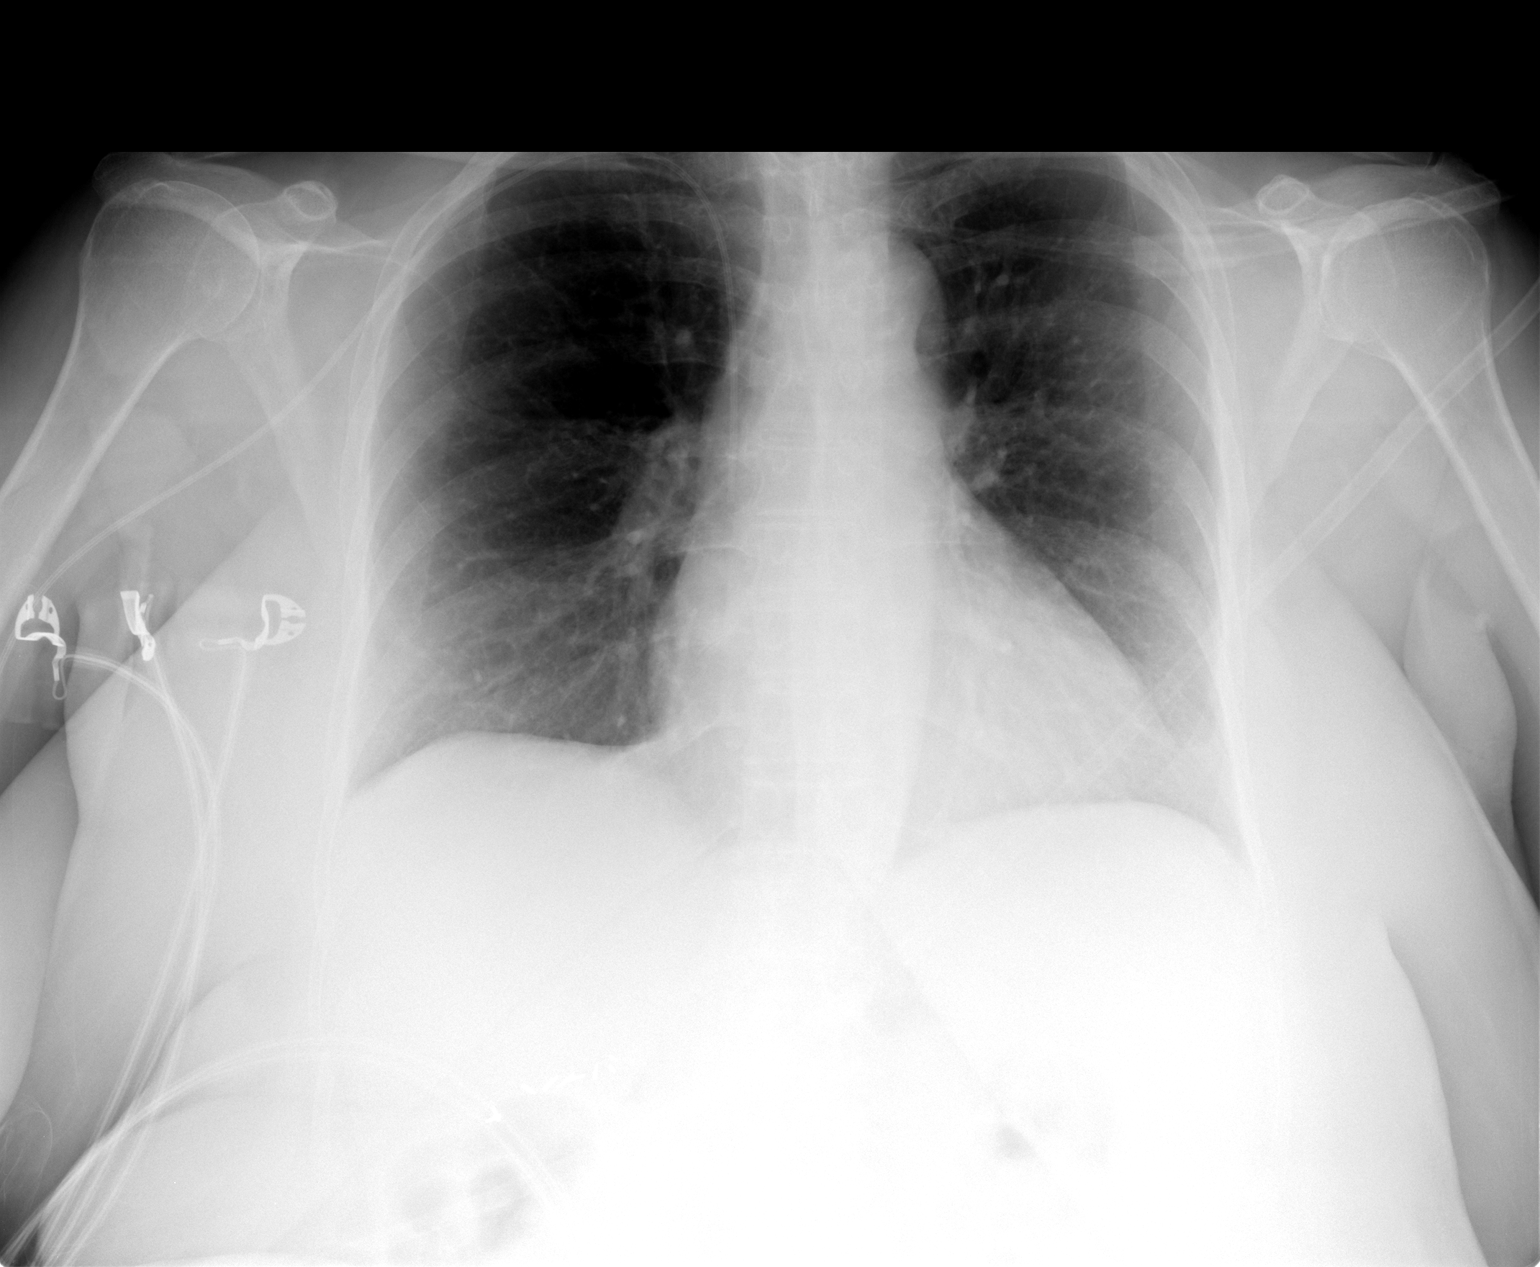

[1 of 1 positions shown; findings below may reference images not displayed]

FINDINGS: Right PICC line tip is in the lower SVC.  Heart is upper
limits normal in size.  Lungs are clear.  No effusions or acute
bony abnormality.
IMPRESSION: Right PICC line tip in the lower SVC.  No active disease.

## 2013-04-20 IMAGING — NM NM MYOCAR SINGLE W/SPECT W/WALL MOTION & EF
2 series · 12 of 12 positions shown · non-contrast
Comparison: none

nm myoview pharmacologic stress

Ordering Physician: CATHERINA BAUMGARTEN
Nasrin Physician: [REDACTED]al Data: 70-year-old woman with chest pain and EKG
abnormalities during psychologic stress.
NUCLEAR MEDICINE ADENOSINE STRESS MYOVIEW STUDY WITH SPECT AND LEFT
VENTRIUCLAR EJECTION FRACTION
Radionuclide Data: One-day rest/stress protocol performed with
[DATE] mCi of Pc-YYm Myoview.
Stress Data: Regadenoson infusion resulted in a moderate increase
in heart rate with no significant change in blood pressure. Chest
discomfort developed following drug administration. No arrhythmias
occurred.
EKG: Normal sinus rhythm, nondiagnostic inferior Q-waves,
nonspecific ST-T wave abnormality.  With pharmacologic stress, 1 mm
of downsloping ST-segment depression appeared in leads I and avL
with slight ST-segment elevation in lead III.   This resolved at 3
minutes into recovery.
Scintigraphic Data: Acquisition performed with arms down.  Mild to
moderate breast attenuation was noted.  Left ventricular size was
normal.  On tomographic images reconstructed in standard planes,
there was a mild and minimally sized focal defect in the distal
septum for which no reversibility was apparent.  A moderate-sized
inferolateral defect of mild to moderate intensity was also
present, also without reversibility.  The numeric analysis
indicated mild transient ischemic dilatation, but this was not
apparent by visual inspection.  The gated reconstruction
demonstrated normal to hyperdynamic regional and global left
ventricular systolic function as well as normal systolic
accentuation of activity throughout.  Estimated ejection fraction
exceeded 65%.

[Series 1: cr cardiac tc low dose · 6.41mm/px · 6 of 64 frames shown]
[frame 6/64]
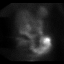
[frame 16/64]
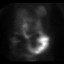
[frame 27/64]
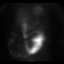
[frame 38/64]
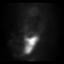
[frame 48/64]
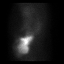
[frame 59/64]
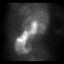

[Series 1: cs cardiac tc hi dose · 6.41mm/px · 6 of 512 frames shown]
[frame 43/512]
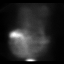
[frame 128/512]
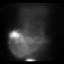
[frame 214/512]
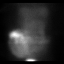
[frame 299/512]
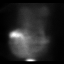
[frame 384/512]
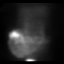
[frame 470/512]
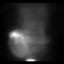

[12 of 12 positions shown; findings below may reference images not displayed]

IMPRESSION: Abnormal pharmacologic stress nuclear study revealing significant
stress-induced EKG abnormalities, normal left ventricular size and
normal left ventricular systolic function.  By scintigraphic
imaging, there was a borderline distal septal defect of uncertain
significance as well as a substantial persistent area of
hypoperfusion inferolaterally.  While this could represent breast
attenuation, the possibility of scarring in this region must be
considered.  Moreover, although there was no objective evidence for
ischemia, severe ischemia could produce this pattern.  Normal wall
motion and systolic accentuation of activity in this region
indicates substantial functional myocardium.

## 2013-05-06 ENCOUNTER — Telehealth: Payer: Self-pay | Admitting: Nurse Practitioner

## 2013-05-06 ENCOUNTER — Other Ambulatory Visit: Payer: Self-pay | Admitting: Physician Assistant

## 2013-05-08 ENCOUNTER — Other Ambulatory Visit: Payer: Self-pay | Admitting: Nurse Practitioner

## 2013-05-11 NOTE — Telephone Encounter (Signed)
Last filled 03/09/13, last seen 11/03/12, call into [facility]

## 2013-05-11 NOTE — Telephone Encounter (Signed)
Phoned into [facility].

## 2013-05-11 NOTE — Telephone Encounter (Signed)
Please refill aprazolam

## 2013-05-13 ENCOUNTER — Other Ambulatory Visit: Payer: Self-pay | Admitting: *Deleted

## 2013-05-13 MED ORDER — GABAPENTIN 400 MG PO CAPS: 400.0000 mg | ORAL_CAPSULE | Freq: Four times a day (QID) | ORAL | Status: DC

## 2013-05-13 NOTE — Telephone Encounter (Signed)
LAST OV 11/03/12. LAST RF 04/13/13.

## 2013-05-13 NOTE — Telephone Encounter (Signed)
ntbs

## 2013-05-18 ENCOUNTER — Other Ambulatory Visit: Payer: Self-pay | Admitting: Physician Assistant

## 2013-05-22 ENCOUNTER — Telehealth: Payer: Self-pay | Admitting: Nurse Practitioner

## 2013-05-25 ENCOUNTER — Telehealth: Payer: Self-pay | Admitting: Cardiovascular Disease

## 2013-05-25 NOTE — Telephone Encounter (Signed)
Called [facility] and pt is taking 25 mg BID.

## 2013-05-25 NOTE — Telephone Encounter (Signed)
New problem   Need to know which medication dosage pt need to be taking for Metoprolol. Please advise.she is taking 25mg  and 50mg  from two doctors.

## 2013-06-05 ENCOUNTER — Other Ambulatory Visit: Payer: Self-pay | Admitting: Nurse Practitioner

## 2013-06-08 NOTE — Telephone Encounter (Signed)
Last seen 11/03/12  MMM  If approved route to nurse to call into [facility]

## 2013-06-08 NOTE — Telephone Encounter (Signed)
NTBS for future refills- please call in xanax 0 refills

## 2013-06-09 ENCOUNTER — Telehealth: Payer: Self-pay | Admitting: Nurse Practitioner

## 2013-06-09 MED ORDER — ALPRAZOLAM 1 MG PO TABS: 1.0000 mg | ORAL_TABLET | Freq: Four times a day (QID) | ORAL | Status: DC

## 2013-06-09 NOTE — Telephone Encounter (Signed)
Please call in xanax 1mg  1 po BID #120 0 refills

## 2013-06-09 NOTE — Telephone Encounter (Signed)
Called in.

## 2013-06-10 NOTE — Telephone Encounter (Signed)
rx called into pharmacy

## 2013-06-11 ENCOUNTER — Telehealth: Payer: Self-pay | Admitting: Cardiovascular Disease

## 2013-06-11 NOTE — Telephone Encounter (Signed)
Patient states she has insomnia and she has had three "nervous breakdowns" and "numerous other problems". She states she is needing a medication to help her sleep. Patient spent over 15 minutes explaining numerous traumatic events in her life that she states keeps her having nervous breakdowns. She states she has seen psychiatrist previously but patient does not feel they are helpful for her situation. She states that she is poor and she does not trust her Primary Care Physicians. States she does not have a support system. She no longer goes to church and has no social interactions. She is married but her husband is "disabled and unable to assist her with this problem". She is looking for advisement or a prescription to help her sleep. Discussed her current medications with patient, especially Xanax. Patient states she never takes too much of it and that it does not make her sleepy anyway - that "all it does is help me not be so nervous".   Patient was tearful during phone call and verbalized that she would prefer for "God to just take her and let her go on to [place]". Patient denies suicidal ideation because she states "God does not take a dead sacrifice. If I kill myself then I could not go to [place], so I would never hurt myself or kill myself".  Patient states when asked that she does feel safe in her environment because her husband and she gets along "okay and have been married a long time and he is disabled". Discussed with patient the option to be seen by her PCP, Urgent Care or Emergency Room or even that she could call 911 for transportation to the Emergency Room for evaluation for both the sleeplessness and what she termed "my depression". Patient states she would be willing to see a PCP but when she tried to get a new PCP a couple years ago, she stated nine practices turned her down saying they were not taking new patients. She states she does not want to contact her curent PCP because "I never get to  see a real doctor anyway". Asked her to consider calling her previous PCP, Dr. Nicoletta Ba, at [facility] in [place], [place]. She stated she would be willing to do that but she would have to arrange transportation services with a [place] that might take two weeks.  I offered to call Dr. Samul Dada office to inform them that this patient would be calling them to get an appointment. Patient agreed and gave permission for me to call them with her voiced needs for depression and sleeplessness. Further advised patient to call current PCP for assistance and/or Psychiatric Evaluation. Also reiterated that patient could call 911 for transport to Emergency Room to be evaluated for her concerns.  Patient acknowledged that it was an option. She stated that she had "no friends, no family, no one that cares". Emphasized to patient that Dr. Clifton James would be notified of patient's request for sleep aid and further advisement on other issues and concerns above. After speaking with patient at length (approximately 20-30 minutes), patient agreed to hang up and call Dr. Samul Dada office immediately to make an appointment. Dr. Samul Dada office was called too by this office Shela Nevin, RN) to inform them that patient would be calling them immediately to make an appointment for evaluation of significant mental status/depression and sleeplessness.  Spoke with Misty Stanley at Dr. Samul Dada office. She stated she would be on the lookout for this patient's call. Stated that patient did  not need a referral for psychiatric evaluation. Informed Lisa at Dr. Samul Dada office that patient had been instructed and encouraged to consider going to Emergency Room (even if by 911 transport) for appropriate evaluation but that patient wanted to be seen by PCP to assist her overall with everything. Routed to Dr. Letta Moynahan, RN/Gina Lesli Albee, RN.

## 2013-06-11 NOTE — Telephone Encounter (Signed)
New problem:  Pt is c/o lack of sleep. Pt is wanting her doctor to call her in something to help her sleep. Pt is requesting Dr. Clifton James call something into [facility] in [place].

## 2013-06-12 ENCOUNTER — Emergency Department (HOSPITAL_COMMUNITY)
Admission: EM | Admit: 2013-06-12 | Discharge: 2013-06-12 | Disposition: A | Discharge status: Home / Self Care | Payer: Medicare Other | Source: Home / Self Care

## 2013-06-12 ENCOUNTER — Encounter (HOSPITAL_COMMUNITY): Payer: Self-pay

## 2013-06-12 ENCOUNTER — Encounter (HOSPITAL_COMMUNITY): Payer: Self-pay | Admitting: Emergency Medicine

## 2013-06-12 ENCOUNTER — Inpatient Hospital Stay (HOSPITAL_COMMUNITY)
Admission: AD | Admit: 2013-06-12 | Discharge: 2013-06-17 | DRG: 885 | Disposition: A | Discharge status: Home / Self Care | Payer: Medicare Other | Source: Intra-hospital | Attending: Psychiatry | Admitting: Psychiatry

## 2013-06-12 DIAGNOSIS — I5033 Acute on chronic diastolic (congestive) heart failure: Secondary | ICD-10-CM

## 2013-06-12 DIAGNOSIS — K5790 Diverticulosis of intestine, part unspecified, without perforation or abscess without bleeding: Secondary | ICD-10-CM

## 2013-06-12 DIAGNOSIS — F329 Major depressive disorder, single episode, unspecified: Secondary | ICD-10-CM | POA: Diagnosis not present

## 2013-06-12 DIAGNOSIS — Z7982 Long term (current) use of aspirin: Secondary | ICD-10-CM | POA: Insufficient documentation

## 2013-06-12 DIAGNOSIS — Z792 Long term (current) use of antibiotics: Secondary | ICD-10-CM | POA: Insufficient documentation

## 2013-06-12 DIAGNOSIS — F32A Depression, unspecified: Secondary | ICD-10-CM | POA: Diagnosis present

## 2013-06-12 DIAGNOSIS — F411 Generalized anxiety disorder: Secondary | ICD-10-CM | POA: Diagnosis present

## 2013-06-12 DIAGNOSIS — I10 Essential (primary) hypertension: Secondary | ICD-10-CM | POA: Insufficient documentation

## 2013-06-12 DIAGNOSIS — J4489 Other specified chronic obstructive pulmonary disease: Secondary | ICD-10-CM | POA: Insufficient documentation

## 2013-06-12 DIAGNOSIS — I4891 Unspecified atrial fibrillation: Secondary | ICD-10-CM | POA: Diagnosis present

## 2013-06-12 DIAGNOSIS — F4310 Post-traumatic stress disorder, unspecified: Secondary | ICD-10-CM

## 2013-06-12 DIAGNOSIS — R7989 Other specified abnormal findings of blood chemistry: Secondary | ICD-10-CM

## 2013-06-12 DIAGNOSIS — R609 Edema, unspecified: Secondary | ICD-10-CM

## 2013-06-12 DIAGNOSIS — K922 Gastrointestinal hemorrhage, unspecified: Secondary | ICD-10-CM

## 2013-06-12 DIAGNOSIS — J449 Chronic obstructive pulmonary disease, unspecified: Secondary | ICD-10-CM | POA: Insufficient documentation

## 2013-06-12 DIAGNOSIS — Z8711 Personal history of peptic ulcer disease: Secondary | ICD-10-CM | POA: Insufficient documentation

## 2013-06-12 DIAGNOSIS — F332 Major depressive disorder, recurrent severe without psychotic features: Principal | ICD-10-CM

## 2013-06-12 DIAGNOSIS — J45909 Unspecified asthma, uncomplicated: Secondary | ICD-10-CM | POA: Diagnosis present

## 2013-06-12 DIAGNOSIS — Z79899 Other long term (current) drug therapy: Secondary | ICD-10-CM | POA: Diagnosis not present

## 2013-06-12 DIAGNOSIS — K5520 Angiodysplasia of colon without hemorrhage: Secondary | ICD-10-CM

## 2013-06-12 DIAGNOSIS — F419 Anxiety disorder, unspecified: Secondary | ICD-10-CM

## 2013-06-12 DIAGNOSIS — E669 Obesity, unspecified: Secondary | ICD-10-CM

## 2013-06-12 DIAGNOSIS — I2510 Atherosclerotic heart disease of native coronary artery without angina pectoris: Secondary | ICD-10-CM

## 2013-06-12 DIAGNOSIS — R45851 Suicidal ideations: Secondary | ICD-10-CM | POA: Insufficient documentation

## 2013-06-12 DIAGNOSIS — Z87891 Personal history of nicotine dependence: Secondary | ICD-10-CM | POA: Insufficient documentation

## 2013-06-12 DIAGNOSIS — R600 Localized edema: Secondary | ICD-10-CM

## 2013-06-12 DIAGNOSIS — D62 Acute posthemorrhagic anemia: Secondary | ICD-10-CM

## 2013-06-12 DIAGNOSIS — I252 Old myocardial infarction: Secondary | ICD-10-CM | POA: Insufficient documentation

## 2013-06-12 DIAGNOSIS — Q2733 Arteriovenous malformation of digestive system vessel: Secondary | ICD-10-CM | POA: Insufficient documentation

## 2013-06-12 DIAGNOSIS — F3289 Other specified depressive episodes: Secondary | ICD-10-CM | POA: Diagnosis not present

## 2013-06-12 DIAGNOSIS — I214 Non-ST elevation (NSTEMI) myocardial infarction: Secondary | ICD-10-CM

## 2013-06-12 DIAGNOSIS — Z7902 Long term (current) use of antithrombotics/antiplatelets: Secondary | ICD-10-CM | POA: Insufficient documentation

## 2013-06-12 DIAGNOSIS — I209 Angina pectoris, unspecified: Secondary | ICD-10-CM

## 2013-06-12 DIAGNOSIS — K219 Gastro-esophageal reflux disease without esophagitis: Secondary | ICD-10-CM | POA: Diagnosis present

## 2013-06-12 DIAGNOSIS — R739 Hyperglycemia, unspecified: Secondary | ICD-10-CM

## 2013-06-12 DIAGNOSIS — E876 Hypokalemia: Secondary | ICD-10-CM

## 2013-06-12 DIAGNOSIS — IMO0001 Reserved for inherently not codable concepts without codable children: Secondary | ICD-10-CM | POA: Diagnosis present

## 2013-06-12 DIAGNOSIS — Z8701 Personal history of pneumonia (recurrent): Secondary | ICD-10-CM | POA: Insufficient documentation

## 2013-06-12 LAB — URINALYSIS, ROUTINE W REFLEX MICROSCOPIC
Bilirubin Urine: NEGATIVE
Ketones, ur: NEGATIVE mg/dL
Nitrite: NEGATIVE
Urobilinogen, UA: 0.2 mg/dL (ref 0.0–1.0)
pH: 6 (ref 5.0–8.0)

## 2013-06-12 LAB — CBC WITH DIFFERENTIAL/PLATELET
Basophils Relative: 1 % (ref 0–1)
Eosinophils Absolute: 0.1 10*3/uL (ref 0.0–0.7)
HCT: 41.8 % (ref 36.0–46.0)
Hemoglobin: 13.4 g/dL (ref 12.0–15.0)
Lymphocytes Relative: 46 % (ref 12–46)
MCH: 27 pg (ref 26.0–34.0)
MCHC: 32.1 g/dL (ref 30.0–36.0)
Monocytes Absolute: 0.3 10*3/uL (ref 0.1–1.0)
Monocytes Relative: 6 % (ref 3–12)
Neutrophils Relative %: 44 % (ref 43–77)
Platelets: 188 10*3/uL (ref 150–400)
RDW: 15.2 % (ref 11.5–15.5)
WBC: 4.8 10*3/uL (ref 4.0–10.5)

## 2013-06-12 LAB — BASIC METABOLIC PANEL
CO2: 30 mEq/L (ref 19–32)
Chloride: 105 mEq/L (ref 96–112)
Sodium: 143 mEq/L (ref 135–145)

## 2013-06-12 LAB — RAPID URINE DRUG SCREEN, HOSP PERFORMED
Barbiturates: NOT DETECTED
Benzodiazepines: POSITIVE — AB
Opiates: NOT DETECTED

## 2013-06-12 LAB — ETHANOL: Alcohol, Ethyl (B): <11 mg/dL (ref 0–11)

## 2013-06-12 LAB — URINE MICROSCOPIC-ADD ON

## 2013-06-12 MED ORDER — POTASSIUM CHLORIDE CRYS ER 20 MEQ PO TBCR
20.0000 meq | EXTENDED_RELEASE_TABLET | Freq: Every day | ORAL | Status: DC
  Administered 2013-06-13 – 2013-06-17 (×5): 20 meq via ORAL
  Filled 2013-06-12 (×7): qty 1

## 2013-06-12 MED ORDER — ALPRAZOLAM 0.5 MG PO TABS
1.0000 mg | ORAL_TABLET | Freq: Two times a day (BID) | ORAL | Status: DC | PRN
  Administered 2013-06-12 – 2013-06-17 (×12): 1 mg via ORAL
  Filled 2013-06-12 (×12): qty 2

## 2013-06-12 MED ORDER — ASPIRIN EC 81 MG PO TBEC
81.0000 mg | DELAYED_RELEASE_TABLET | Freq: Every day | ORAL | Status: DC
  Administered 2013-06-13 – 2013-06-17 (×5): 81 mg via ORAL
  Filled 2013-06-12 (×7): qty 1

## 2013-06-12 MED ORDER — ACETAMINOPHEN 325 MG PO TABS
650.0000 mg | ORAL_TABLET | Freq: Four times a day (QID) | ORAL | Status: DC | PRN
  Administered 2013-06-16: 650 mg via ORAL
  Filled 2013-06-12: qty 2

## 2013-06-12 MED ORDER — ONDANSETRON HCL 4 MG PO TABS: 4.0000 mg | ORAL_TABLET | Freq: Three times a day (TID) | ORAL | Status: DC | PRN

## 2013-06-12 MED ORDER — PANTOPRAZOLE SODIUM 40 MG PO TBEC
40.0000 mg | DELAYED_RELEASE_TABLET | Freq: Every day | ORAL | Status: DC
  Administered 2013-06-13 – 2013-06-17 (×5): 40 mg via ORAL
  Filled 2013-06-12 (×7): qty 1

## 2013-06-12 MED ORDER — POLYSACCHARIDE IRON COMPLEX 150 MG PO CAPS
150.0000 mg | ORAL_CAPSULE | Freq: Every day | ORAL | Status: DC
  Administered 2013-06-13 – 2013-06-17 (×5): 150 mg via ORAL
  Filled 2013-06-12 (×7): qty 1

## 2013-06-12 MED ORDER — FUROSEMIDE 40 MG PO TABS
20.0000 mg | ORAL_TABLET | Freq: Every day | ORAL | Status: DC | PRN
  Filled 2013-06-12: qty 1

## 2013-06-12 MED ORDER — DILTIAZEM HCL ER COATED BEADS 120 MG PO CP24
120.0000 mg | ORAL_CAPSULE | Freq: Every day | ORAL | Status: DC
  Administered 2013-06-12: 120 mg via ORAL
  Filled 2013-06-12 (×5): qty 1

## 2013-06-12 MED ORDER — TRAZODONE HCL 50 MG PO TABS
50.0000 mg | ORAL_TABLET | Freq: Every evening | ORAL | Status: DC | PRN
  Administered 2013-06-12 – 2013-06-17 (×5): 50 mg via ORAL
  Filled 2013-06-12 (×6): qty 1

## 2013-06-12 MED ORDER — MAGNESIUM HYDROXIDE 400 MG/5ML PO SUSP: 30.0000 mL | Freq: Every day | ORAL | Status: DC | PRN

## 2013-06-12 MED ORDER — LORAZEPAM 1 MG PO TABS
1.0000 mg | ORAL_TABLET | Freq: Three times a day (TID) | ORAL | Status: DC | PRN
  Administered 2013-06-12: 1 mg via ORAL
  Filled 2013-06-12: qty 1

## 2013-06-12 MED ORDER — LISINOPRIL 5 MG PO TABS
5.0000 mg | ORAL_TABLET | Freq: Every day | ORAL | Status: DC
  Administered 2013-06-13 – 2013-06-17 (×5): 5 mg via ORAL
  Filled 2013-06-12 (×7): qty 1

## 2013-06-12 MED ORDER — ASPIRIN EC 81 MG PO TBEC
81.0000 mg | DELAYED_RELEASE_TABLET | Freq: Every day | ORAL | Status: DC
  Administered 2013-06-12: 81 mg via ORAL
  Filled 2013-06-12 (×3): qty 1

## 2013-06-12 MED ORDER — GABAPENTIN 400 MG PO CAPS
400.0000 mg | ORAL_CAPSULE | Freq: Four times a day (QID) | ORAL | Status: DC
  Administered 2013-06-12 – 2013-06-17 (×17): 400 mg via ORAL
  Filled 2013-06-12 (×27): qty 1

## 2013-06-12 MED ORDER — ACETAMINOPHEN 325 MG PO TABS: 650.0000 mg | ORAL_TABLET | ORAL | Status: DC | PRN

## 2013-06-12 MED ORDER — CLOPIDOGREL BISULFATE 75 MG PO TABS
75.0000 mg | ORAL_TABLET | Freq: Every day | ORAL | Status: DC
  Administered 2013-06-13 – 2013-06-17 (×5): 75 mg via ORAL
  Filled 2013-06-12 (×7): qty 1

## 2013-06-12 MED ORDER — INFLUENZA VAC SPLIT QUAD 0.5 ML IM SUSP
0.5000 mL | INTRAMUSCULAR | Status: AC
  Administered 2013-06-14: 0.5 mL via INTRAMUSCULAR
  Filled 2013-06-12: qty 0.5

## 2013-06-12 MED ORDER — LISINOPRIL 5 MG PO TABS
5.0000 mg | ORAL_TABLET | Freq: Every day | ORAL | Status: DC
  Administered 2013-06-12: 5 mg via ORAL
  Filled 2013-06-12: qty 1

## 2013-06-12 MED ORDER — ATORVASTATIN CALCIUM 40 MG PO TABS
40.0000 mg | ORAL_TABLET | Freq: Every day | ORAL | Status: DC
  Administered 2013-06-14 – 2013-06-16 (×3): 40 mg via ORAL
  Filled 2013-06-12 (×6): qty 1

## 2013-06-12 MED ORDER — METOPROLOL TARTRATE 25 MG PO TABS
25.0000 mg | ORAL_TABLET | Freq: Two times a day (BID) | ORAL | Status: DC
  Administered 2013-06-13 – 2013-06-17 (×8): 25 mg via ORAL
  Filled 2013-06-12 (×13): qty 1

## 2013-06-12 MED ORDER — ALPRAZOLAM 0.5 MG PO TABS
1.0000 mg | ORAL_TABLET | Freq: Once | ORAL | Status: AC
  Administered 2013-06-12: 1 mg via ORAL
  Filled 2013-06-12: qty 2

## 2013-06-12 MED ORDER — CLOPIDOGREL BISULFATE 75 MG PO TABS: 75.0000 mg | ORAL_TABLET | Freq: Every day | ORAL | Status: DC

## 2013-06-12 MED ORDER — DILTIAZEM HCL ER COATED BEADS 120 MG PO CP24
120.0000 mg | ORAL_CAPSULE | Freq: Every day | ORAL | Status: DC
  Administered 2013-06-13 – 2013-06-17 (×5): 120 mg via ORAL
  Filled 2013-06-12 (×7): qty 1

## 2013-06-12 NOTE — Telephone Encounter (Signed)
After receiving forwarded  call documentation of 06/11/13 at 3:59 pm from [facility] cardiology office.It was determined that this situation  needed to be addressed by EMS and police, myself and Pearlean Brownie, RN called 911 dispatch for [facility]  Informed dispatcher that the pt had three Previous "nervous breakdowns" and has seen a psychiatrist, tearful during phone call  and would prefer "that God to just take her and let her go on to heaven" and requested medication for sleep. Per documentation from Adron Bene , RN. They recommended that the pt call PCP yesterday 06/11/13. This nurse called Dr. Milinda Cave Patient's PCP without response. Per Joni Reining, NP advised to take the next step to call 911. Dispatcher reports that the police and EMS will evaluate pt at her home and call here at [place] office with disposition.

## 2013-06-12 NOTE — ED Provider Notes (Signed)
CSN: [id number]     Arrival date & time 06/12/13  1046 History   First MD Initiated Contact with Patient 06/12/13 1252     Chief Complaint  Patient presents with  . Medical Clearance   Patient is a 72 y.o. female presenting with mental health disorder. The history is provided by the patient.  Mental Health Problem Presenting symptoms: suicidal thoughts   Degree of incapacity (severity):  Moderate Onset quality:  Gradual Timing:  Constant Progression:  Worsening Chronicity:  New Relieved by:  Nothing Worsened by:  Nothing tried Associated symptoms: no abdominal pain and no chest pain   pt reports increasing thoughts of harming herself and is feeling more depressed She does not have an active plan at this time   Past Medical History  Diagnosis Date  . Hypertension   . Anxiety and depression   . Asthma   . Fibromyalgia   . History of pneumonia   . Peripheral edema   . Peptic ulcer disease   . Hiatal hernia   . GERD (gastroesophageal reflux disease)   . A-fib     cardioversion and TEE at [facility]; pt reports DCCV 2014 at [facility] as well.  . 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 Diverticular disease 10/01/2011  . MI, acute, non ST segment elevation 10/02/2011  . GI bleed     ?recurrent--therefore, no anticoagulant  . COPD (chronic obstructive pulmonary disease)    Past Surgical History  Procedure Laterality Date  . Abdominal hysterectomy  age 53    nonmalignant reason  . Cholecystectomy    . Abdominal exploration surgery    . Abd tumor removed      states was 10 lbs, benign  . Knee surgery    . Bladder stent    . Esophagogastroduodenoscopy  08/24/2011    Procedure: ESOPHAGOGASTRODUODENOSCOPY (EGD);  Surgeon: Corbin Ade, MD;  [facility];  Service: Endoscopy;  Laterality: N/A;  give phenergan 12.5mg  iv 30 mins prior to procedure  . Colonoscopy  08/25/2011    Procedure: COLONOSCOPY;  Surgeon: Corbin Ade, MD;  Location: [facility];  Service:  Endoscopy;  Laterality: N/A;  . Colonoscopy  10/03/2011    Procedure: COLONOSCOPY;  Surgeon: Corbin Ade, MD;  Location: [facility];  Service: Endoscopy;  Laterality: N/A;  NEEDS PHENERGAN 25 MG IV ON CALL  . Colonoscopy  10/04/2011    Procedure: COLONOSCOPY;  Surgeon: Corbin Ade, MD;  Location: [facility];  Service: Endoscopy;  Laterality: N/A;  Phenergan 12.5 mg ON CALL  . Transthoracic echocardiogram  09/2011    EF 60-65%, septal hypokinesia  . Cardiovascular stress test  09/2011    equivocal result, most likely low risk; pt refused the recommended cardiac cath to follow this up.   Family History  Problem Relation Age of Onset  . Diabetes Mother     Deceased  . Hypertension Mother   . Cancer Father     Deceased  . Colon cancer Neg Hx   . Coronary artery disease Mother   . Heart failure Mother    History  Substance Use Topics  . Smoking status: Former Smoker -- 1.00 packs/day for 15 years    Types: Cigarettes    Quit date: 06/26/1995  . Smokeless tobacco: Never Used  . Alcohol Use: No   OB History   Grav Para Term Preterm Abortions TAB SAB Ect Mult Living   3 3 3        3  Review of Systems  Constitutional: Negative for fever.  Respiratory: Negative for shortness of breath.   Cardiovascular: Negative for chest pain.  Gastrointestinal: Negative for abdominal pain.  Psychiatric/Behavioral: Positive for suicidal ideas.  All other systems reviewed and are negative.    Allergies  Dye fdc red; Sulfa antibiotics; and Codeine  Home Medications   Current Outpatient Rx  Name  Route  Sig  Dispense  Refill  . ALPRAZolam (XANAX) 1 MG tablet   Oral   Take 1 tablet (1 mg total) by mouth 4 (four) times daily.   120 tablet   0   . aspirin EC 81 MG tablet   Oral   Take 81 mg by mouth daily.         [name] atorvastatin (LIPITOR) 40 MG tablet   Oral   Take 1 tablet (40 mg total) by mouth daily at 6 PM.   30 tablet   11   . ciprofloxacin (CIPRO) 500 MG tablet    Oral   Take 500 mg by mouth 2 (two) times daily.         . clopidogrel (PLAVIX) 75 MG tablet   Oral   Take 1 tablet (75 mg total) by mouth daily with breakfast.   30 tablet   11   . diltiazem (CARDIZEM CD) 120 MG 24 hr capsule   Oral   Take 1 capsule (120 mg total) by mouth daily.   30 capsule   5   . fish oil-omega-3 fatty acids 1000 MG capsule   Oral   Take 1 g by mouth daily.         . furosemide (LASIX) 40 MG tablet   Oral   Take 2 tablets (80 mg total) by mouth 2 (two) times daily.   120 tablet   3   . gabapentin (NEURONTIN) 400 MG capsule   Oral   Take 1 capsule (400 mg total) by mouth 4 (four) times daily.   120 capsule   0   . iron polysaccharides (NIFEREX) 150 MG capsule   Oral   Take 150 mg by mouth daily.         [name] lisinopril (PRINIVIL,ZESTRIL) 5 MG tablet   Oral   Take 1 tablet (5 mg total) by mouth daily.   30 tablet   6   . metoprolol (LOPRESSOR) 25 MG tablet   Oral   Take 1 tablet (25 mg total) by mouth 2 (two) times daily.   60 tablet   11   . nitroGLYCERIN (NITROSTAT) 0.4 MG SL tablet   Sublingual   Place 0.4 mg under the tongue every 5 (five) minutes as needed for chest pain.         . pantoprazole (PROTONIX) 40 MG tablet      TAKE ONE TABLET BY MOUTH TWICE DAILY   60 tablet   0   . Pantoprazole Sodium (PROTONIX PO)   Oral   Take 40 mg by mouth daily.          . potassium chloride SA (K-DUR,KLOR-CON) 20 MEQ tablet   Oral   Take 1 tablet (20 mEq total) by mouth daily.   30 tablet   3    BP 190/82  Pulse 70  Temp(Src) 97.7 F (36.5 C) (Oral)  Resp 20  Ht 5' (1.524 m)  Wt 205 lb (92.987 kg)  BMI 40.04 kg/m2  SpO2 99% Physical Exam CONSTITUTIONAL: Well developed/well nourished HEAD: Normocephalic/atraumatic EYES: EOMI/PERRL ENMT: Mucous membranes moist NECK: supple  no meningeal signs CV: S1/S2 noted, no murmurs/rubs/gallops noted LUNGS: Lungs are clear to auscultation bilaterally, no apparent  distress ABDOMEN: soft, nontender, no rebound or guarding NEURO: Pt is awake/alert, moves all extremitiesx4 EXTREMITIES: pulses normal, full ROM, chronic symmetric edema to bilateral LE, no evidence of cellulitis. SKIN: warm, color normal PSYCH: she is anxious  ED Course  Procedures (including critical care time)  1:21 PM Pt presents for increased depression and suicidal thoughts She reported to cardiology providers via phone that she was depressed and law enforcement was involved Pt presents with law enforcement She is currently stable and in no distress Will need telepsych 2:06 PM Pt medically stable Will need telepsych and would likely benefit from admission  Labs Review Labs Reviewed  BASIC METABOLIC PANEL - Abnormal; Notable for the following:    Glucose, Bld 142 (*)    GFR calc non Af Amer 64 (*)    GFR calc Af Amer 74 (*)    All other components within normal limits  CBC WITH DIFFERENTIAL  ETHANOL  URINALYSIS, ROUTINE W REFLEX MICROSCOPIC  URINE RAPID DRUG SCREEN (HOSP PERFORMED)   Imaging Review No results found.  EKG Interpretation   None       MDM  No diagnosis found. Nursing notes including past medical history and social history reviewed and considered in documentation Labs/vital reviewed and considered     Joya Gaskins, MD 06/12/13 1407

## 2013-06-12 NOTE — Tx Team (Signed)
Initial Interdisciplinary Treatment Plan  PATIENT STRENGTHS: (choose at least two) Active sense of humor  PATIENT STRESSORS: Marital or family conflict   PROBLEM LIST: Problem List/Patient Goals Date to be addressed Date deferred Reason deferred Estimated date of resolution  Depression       Anxiety                                                 DISCHARGE CRITERIA:  Ability to meet basic life and health needs Improved stabilization in mood, thinking, and/or behavior Safe-care adequate arrangements made Verbal commitment to aftercare and medication compliance  PRELIMINARY DISCHARGE PLAN: Attend aftercare/continuing care group Return to previous living arrangement  PATIENT/FAMIILY INVOLVEMENT: This treatment plan has been presented to and reviewed with the patient, Sharon Compton, and/or family member.  The patient and family have been given the opportunity to ask questions and make suggestions.  Sharon Compton 06/12/2013, 11:16 PM

## 2013-06-12 NOTE — Telephone Encounter (Signed)
Outstanding work. 

## 2013-06-12 NOTE — Telephone Encounter (Signed)
Pat, I would prefer that this be handled by primary care. It looks like she may have transferred care to our [place] office as last note was by Joni Reining, NP in September. I agree with the recommendations outlined in the phone note by Shela Nevin. This is not a cardiology issue but she clearly needs to seek assistance for her psychiatric issues. chris
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
## 2013-06-12 NOTE — Telephone Encounter (Signed)
Spoke with my CMA, Faythe Ghee, and she says she received a call about this 06/11/13 and asked what specifically we needed to do (such as call pt and arrange appt) and she was informed that the patient would contact us.  There was no report of the urgency of the situation to [place].
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
## 2013-06-12 NOTE — ED Notes (Signed)
Pt brought in by [employer] for emergency commitment, no papers with officer, ER to fill papers out.

## 2013-06-12 NOTE — Telephone Encounter (Signed)
Called PCP, Dr. Nicoletta Ba, at [facility] in [place], [place], [phone]) left a message for Misty Stanley told them [place] at front desk it was urgent.  [place] states that Misty Stanley will [employer] office as soon as she gets out of a pt's room. Joni Reining, NP is made aware.
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
## 2013-06-12 NOTE — ED Notes (Signed)
Report given to [facility] with [facility]

## 2013-06-12 NOTE — Telephone Encounter (Signed)
Pt is seen in [place] office. I have discussed this phone note with [facility] in [place] and will forward to her for follow up.

## 2013-06-13 NOTE — Progress Notes (Signed)
[facility] MD Progress Note  06/13/2013 2:02 PM Sharon Compton  MRN:  [id number] Subjective: Sharon Compton 72 year old female, with hx of depression. Pt reports that she  isnt doing to good, still doesn't know why she is here. She states she called the doctors office to ask about her medical history and while talking with the nurse told them about feeling depressed and how she has no one to support her.States she is glad to be here because she can spread the word of God to these people in here. Pt denies voicing any reports of SI/HI or hallucinations. She rates her depression at 7/10, anxiety 4/10. She is not eating well and/or sleeping swell, and wishes to have something for sleep. She is having a flare up of her IBS at this time, currently drinking liquids and eating minimum amounts without aggravating her stomach.    Diagnosis:   DSM5: Schizophrenia Disorders:  Obsessive-Compulsive Disorders:   Trauma-Stressor Disorders:  Posttraumatic Stress Disorder (309.81) Substance/Addictive Disorders: Depressive Disorders:  Major Depressive Disorder - Moderate (296.22)  Axis I: Major Depression, Recurrent severe and Post Traumatic Stress Disorder Axis II: Deferred Axis III:  Past Medical History  Diagnosis Date  . Hypertension   . Anxiety and depression   . Asthma   . Fibromyalgia   . History of pneumonia   . Peripheral edema   . Peptic ulcer disease   . Hiatal hernia   . GERD (gastroesophageal reflux disease)   . A-fib     cardioversion and TEE at [facility]; pt reports DCCV 2014 at [facility] as well.  . 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 Diverticular disease 10/01/2011  . MI, acute, non ST segment elevation 10/02/2011  . GI bleed     ?recurrent--therefore, no anticoagulant  . COPD (chronic obstructive pulmonary disease)    Axis IV: occupational problems, other psychosocial or environmental problems, problems related to social environment, problems with primary support group and financial  issues Axis V: 51-60 moderate symptoms  ADL's:  Intact  Sleep: Poor  Appetite:  Poor  Suicidal Ideation:  Plan:  denies Intent:  Denies Means:  Denies Homicidal Ideation:  Plan:  Denies Intent:  Denies Means:  Denies AEB (as evidenced by):  Psychiatric Specialty Exam: Review of Systems  Gastrointestinal: Positive for abdominal pain and diarrhea.       Hx of IBS  Psychiatric/Behavioral: Positive for depression. Negative for suicidal ideas, hallucinations and substance abuse. The patient is nervous/anxious and has insomnia.   All other systems reviewed and are negative.    Blood pressure 118/80, pulse 101, temperature 97.9 F (36.6 C), temperature source Oral, resp. rate 20, height 5' (1.524 m), weight 87.998 kg (194 lb), SpO2 98.00%.Body mass index is 37.89 kg/(m^2).  General Appearance: Fairly Groomed  Patent attorney::  Good  Speech:  Normal Rate  Volume:  Normal  Mood:  Depressed and Hopeless  Affect:  Flat and Tearful  Thought Process:  Tangential  Orientation:  Full (Time, Place, and Person)  Thought Content:  Rumination  Suicidal Thoughts:  No  Homicidal Thoughts:  No  Memory:  Immediate;   Good Recent;   Good Remote;   Good  Judgement:  Fair  Insight:  Fair  Psychomotor Activity:  Normal  Concentration:  Fair  Recall:  Good  Akathisia:  No  Handed:  Right  AIMS (if indicated):     Assets:  Desire for Improvement Housing Transportation  Sleep:  Number of Hours: 2.75   Current Medications: Current  Facility-Administered Medications  Medication Dose Route Frequency Provider Last Rate Last Dose  . acetaminophen (TYLENOL) tablet 650 mg  650 mg Oral Q6H PRN Kristeen Mans, NP      . ALPRAZolam Prudy Feeler) tablet 1 mg  1 mg Oral BID PRN Kristeen Mans, NP   1 mg at 06/13/13 0912  . aspirin EC tablet 81 mg  81 mg Oral Daily Kristeen Mans, NP   81 mg at 06/13/13 1610  . atorvastatin (LIPITOR) tablet 40 mg  40 mg Oral q1800 Kristeen Mans, NP      . clopidogrel (PLAVIX)  tablet 75 mg  75 mg Oral Q breakfast Kristeen Mans, NP   75 mg at 06/13/13 0906  . diltiazem (CARDIZEM CD) 24 hr capsule 120 mg  120 mg Oral Daily Kristeen Mans, NP   120 mg at 06/13/13 9604  . furosemide (LASIX) tablet 20 mg  20 mg Oral Daily PRN Kristeen Mans, NP      . gabapentin (NEURONTIN) capsule 400 mg  400 mg Oral QID Kristeen Mans, NP   400 mg at 06/13/13 0907  . influenza vac split quadrivalent PF (FLUARIX) injection 0.5 mL  0.5 mL Intramuscular Tomorrow-1000 Nehemiah Settle, MD      . iron polysaccharides (NIFEREX) capsule 150 mg  150 mg Oral Daily Kristeen Mans, NP   150 mg at 06/13/13 0907  . lisinopril (PRINIVIL,ZESTRIL) tablet 5 mg  5 mg Oral Daily Kristeen Mans, NP   5 mg at 06/13/13 0908  . magnesium hydroxide (MILK OF MAGNESIA) suspension 30 mL  30 mL Oral Daily PRN Kristeen Mans, NP      . metoprolol tartrate (LOPRESSOR) tablet 25 mg  25 mg Oral BID Kristeen Mans, NP   25 mg at 06/13/13 0908  . pantoprazole (PROTONIX) EC tablet 40 mg  40 mg Oral Daily Kristeen Mans, NP   40 mg at 06/13/13 5409  . potassium chloride SA (K-DUR,KLOR-CON) CR tablet 20 mEq  20 mEq Oral Daily Kristeen Mans, NP   20 mEq at 06/13/13 8119  . traZODone (DESYREL) tablet 50 mg  50 mg Oral QHS PRN Kristeen Mans, NP   50 mg at 06/12/13 2351    Lab Results:  Results for orders placed during the hospital encounter of 06/12/13 (from the past 48 hour(s))  CBC WITH DIFFERENTIAL     Status: None   Collection Time    06/12/13 12:17 PM      Result Value Range   WBC 4.8  4.0 - 10.5 K/uL   RBC 4.96  3.87 - 5.11 MIL/uL   Hemoglobin 13.4  12.0 - 15.0 g/dL   HCT 14.7  82.9 - 56.2 %   MCV 84.3  78.0 - 100.0 fL   MCH 27.0  26.0 - 34.0 pg   MCHC 32.1  30.0 - 36.0 g/dL   RDW 13.0  86.5 - 78.4 %   Platelets 188  150 - 400 K/uL   Neutrophils Relative % 44  43 - 77 %   Neutro Abs 2.1  1.7 - 7.7 K/uL   Lymphocytes Relative 46  12 - 46 %   Lymphs Abs 2.2  0.7 - 4.0 K/uL   Monocytes Relative 6  3 - 12 %    Monocytes Absolute 0.3  0.1 - 1.0 K/uL   Eosinophils Relative 2  0 - 5 %   Eosinophils Absolute 0.1  0.0 - 0.7 K/uL  Basophils Relative 1  0 - 1 %   Basophils Absolute 0.1  0.0 - 0.1 K/uL  BASIC METABOLIC PANEL     Status: Abnormal   Collection Time    06/12/13 12:17 PM      Result Value Range   Sodium 143  135 - 145 mEq/L   Potassium 3.8  3.5 - 5.1 mEq/L   Chloride 105  96 - 112 mEq/L   CO2 30  19 - 32 mEq/L   Glucose, Bld 142 (*) 70 - 99 mg/dL   BUN 14  6 - 23 mg/dL   Creatinine, Ser 4.09  0.50 - 1.10 mg/dL   Calcium 9.2  8.4 - 81.1 mg/dL   GFR calc non Af Amer 64 (*) >90 mL/min   GFR calc Af Amer 74 (*) >90 mL/min   Comment: (NOTE)     The eGFR has been calculated using the CKD EPI equation.     This calculation has not been validated in all clinical situations.     eGFR's persistently <90 mL/min signify possible Chronic Kidney     Disease.  ETHANOL     Status: None   Collection Time    06/12/13 12:17 PM      Result Value Range   Alcohol, Ethyl (B) <11  0 - 11 mg/dL   Comment:            LOWEST DETECTABLE LIMIT FOR     SERUM ALCOHOL IS 11 mg/dL     FOR MEDICAL PURPOSES ONLY  URINALYSIS, ROUTINE W REFLEX MICROSCOPIC     Status: Abnormal   Collection Time    06/12/13  1:18 PM      Result Value Range   Color, Urine YELLOW  YELLOW   APPearance HAZY (*) CLEAR   Specific Gravity, Urine >1.030 (*) 1.005 - 1.030   pH 6.0  5.0 - 8.0   Glucose, UA NEGATIVE  NEGATIVE mg/dL   Hgb urine dipstick TRACE (*) NEGATIVE   Bilirubin Urine NEGATIVE  NEGATIVE   Ketones, ur NEGATIVE  NEGATIVE mg/dL   Protein, ur TRACE (*) NEGATIVE mg/dL   Urobilinogen, UA 0.2  0.0 - 1.0 mg/dL   Nitrite NEGATIVE  NEGATIVE   Leukocytes, UA NEGATIVE  NEGATIVE  URINE RAPID DRUG SCREEN (HOSP PERFORMED)     Status: Abnormal   Collection Time    06/12/13  1:18 PM      Result Value Range   Opiates NONE DETECTED  NONE DETECTED   Cocaine NONE DETECTED  NONE DETECTED   Benzodiazepines POSITIVE (*) NONE  DETECTED   Amphetamines NONE DETECTED  NONE DETECTED   Tetrahydrocannabinol NONE DETECTED  NONE DETECTED   Barbiturates NONE DETECTED  NONE DETECTED   Comment:            DRUG SCREEN FOR MEDICAL PURPOSES     ONLY.  IF CONFIRMATION IS NEEDED     FOR ANY PURPOSE, NOTIFY LAB     WITHIN 5 DAYS.                LOWEST DETECTABLE LIMITS     FOR URINE DRUG SCREEN     Drug Class       Cutoff (ng/mL)     Amphetamine      1000     Barbiturate      200     Benzodiazepine   200     Tricyclics       300     Opiates  300     Cocaine          300     THC              50  URINE MICROSCOPIC-ADD ON     Status: Abnormal   Collection Time    06/12/13  1:18 PM      Result Value Range   Squamous Epithelial / LPF MANY (*) RARE   RBC / HPF 0-2  <3 RBC/hpf   Crystals CA OXALATE CRYSTALS (*) NEGATIVE    Physical Findings: AIMS: Facial and Oral Movements Muscles of Facial Expression: None, normal Lips and Perioral Area: None, normal Jaw: None, normal Tongue: None, normal,Extremity Movements Upper (arms, wrists, hands, fingers): None, normal Lower (legs, knees, ankles, toes): None, normal, Trunk Movements Neck, shoulders, hips: None, normal, Overall Severity Severity of abnormal movements (highest score from questions above): None, normal Incapacitation due to abnormal movements: None, normal Patient's awareness of abnormal movements (rate only patient's report): No Awareness, Dental Status Current problems with teeth and/or dentures?: No Does patient usually wear dentures?: No  CIWA:  CIWA-Ar Total: 1 COWS:  COWS Total Score: 0  Treatment Plan Summary: Daily contact with patient to assess and evaluate symptoms and progress in treatment Medication management  Plan: Treatment Plan/Recommendations:  1 Individual and group therapy. 2 Medication management for depression, and anxiety to reduce current symptoms to base line and improve the overall levels of functioning: Medications reviewed  with the patient and she stated no untoward effects, home medications in place.  3 Coping skills for depression and anxiety developing.  4 Continue crisis stabilization and management.  5 Address health issues- monitor vital signs, stable; CHF currently stable--may consider consult.  6 Treatment plan in progress to prevent relapse prevention and self care.  7 Psychosocial education regarding relapse prevention and self care 8 Heath care follow up as needed for any health concerns 9 Call for consult with hospitalist for additional specialty patient services as needed.   Medical Decision Making Problem Points:  Established problem, stable/improving (1) and Review of psycho-social stressors (1) Data Points:  Review or order clinical lab tests (1) Review or order medicine tests (1) Review of medication regiment & side effects (2)  I certify that inpatient services furnished can reasonably be expected to improve the patient's condition.   Malachy Chamber S FNP 06/13/2013, 2:02 PM

## 2013-06-13 NOTE — [facility] Group Notes (Signed)
[facility] Group Notes: (Clinical Social Work)   06/13/2013      Type of Therapy:  Group Therapy   Participation Level:  Did Not Attend    Ambrose Mantle, LCSW 06/13/2013, 4:48 PM

## 2013-06-13 NOTE — Progress Notes (Signed)
Psychoeducational Group Note  Date: 06/13/2013 Time:  1015  Group Topic/Focus:  Identifying Needs:   The focus of this group is to help patients identify their personal needs that have been historically problematic and identify healthy behaviors to address their needs.  Participation Level:  Active  Participation Quality:  Attentive  Affect:  Appropriate  Cognitive:  Oriented  Insight:  Improving  Engagement in Group:  Engaged  Additional Comments:    Marlane Hirschmann A 

## 2013-06-13 NOTE — Progress Notes (Signed)
The focus of this group is to help patients review their daily goal of treatment and discuss progress on daily workbooks. Pt did not attend the evening group. 

## 2013-06-13 NOTE — Progress Notes (Signed)
Report received from Levi Aland RN. Writer spoke with patient who was lying in her bed when Clinical research associate entered her room. She c/o insomnia and reported "my nerves are bad". Patient received medications , trazadone,  xanax and a snack. Writer encouraged her to try and rest. Safety maintained on unit with 15 min checks. She currently denies si/hi/a/v hallucinations.

## 2013-06-13 NOTE — Progress Notes (Signed)
.  Psychoeducational Group Note    Date: 06/13/2013 Time: 0930   Goal Setting Purpose of Group: To be able to set a goal that is measurable and that can be accomplished in one day Participation Level: Did not attend  Dione Housekeeper

## 2013-06-14 DIAGNOSIS — F4310 Post-traumatic stress disorder, unspecified: Secondary | ICD-10-CM | POA: Diagnosis present

## 2013-06-14 DIAGNOSIS — F411 Generalized anxiety disorder: Secondary | ICD-10-CM

## 2013-06-14 DIAGNOSIS — F332 Major depressive disorder, recurrent severe without psychotic features: Principal | ICD-10-CM | POA: Diagnosis present

## 2013-06-14 MED ORDER — DICLOXACILLIN SODIUM 250 MG PO CAPS
250.0000 mg | ORAL_CAPSULE | Freq: Four times a day (QID) | ORAL | Status: DC
  Administered 2013-06-14 – 2013-06-17 (×13): 250 mg via ORAL
  Filled 2013-06-14 (×20): qty 1

## 2013-06-14 MED ORDER — INFLUENZA VAC SPLIT QUAD 0.5 ML IM SUSP
0.5000 mL | INTRAMUSCULAR | Status: DC
Start: 2013-06-15 — End: 2013-06-15
  Filled 2013-06-14: qty 0.5

## 2013-06-14 NOTE — Progress Notes (Signed)
D Hend is seen on the unit today. She remains  sad, depressed and flat. She interacts with her peers and staff, saying  " Think this is one of the best hospitals I've ever been in..."    A She completes her self inventory and on it she writes she denies SI within the past 24 hrs, she rates her depression and hopelessness "2/1" and writes on her self inventory " I took care of myself at home" when she continues to expand on days when she was in better physical health and more agile.    R She is given prn Xanax twice, and says " I wish I wouldve never started taking those pills..." She attends her groups, is engaged in the [place] and safety is in place.

## 2013-06-14 NOTE — Care Management Utilization Note (Signed)
Per State Regulation 482.30  The chart was reviewed for necessity with respect to the patient's Admission/ Duration of stay.Admission 06/12/13  Next Review Date:06/15/13  Lacinda Axon, RN, BSN

## 2013-06-14 NOTE — Progress Notes (Signed)
D.  Pt pleasant on approach, denies complaints at this time.  Interacting with peers on the unit, denies SI/HI/hallucinations at this time.  Positive for evening wrap up group.  A.  Support and encouragement offered  R.  Pt remains safe on unit, will continue to monitor.
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
## 2013-06-14 NOTE — [facility] Group Notes (Signed)
[facility] Group Notes:  (Clinical Social Work)  06/14/2013   1:15-2:20PM  Summary of Progress/Problems:  The main focus of today's process group was to   identify the patient's current support system and decide on other supports that can be put in place.  The picture on workbook was used to discuss why additional supports are needed.  An emphasis was placed on using counselor, doctor, therapy groups, 12-step groups, and problem-specific support groups to expand supports.   There was also an extensive discussion about what constitutes a healthy support versus an unhealthy support.  The patient expressed full comprehension of the concepts presented, and agreed that there is a need to add more supports.  The patient reported a desire for more support.  She asked what to do when her husband constantly tells her that he is in more pain than she is.  The group recommended therapy.  Type of Therapy:  Process Group  Participation Level:  Active  Participation Quality:  Attentive and Sharing  Affect:  Blunted and Depressed  Cognitive:  Appropriate and Oriented  Insight:  Engaged  Engagement in Therapy:  Engaged  Modes of Intervention:  Education,  Support and [employer], LCSW 06/14/2013, 4:00pm

## 2013-06-14 NOTE — Progress Notes (Signed)
Writer has observed patient in her room talking with her roommate and after group she was in the dayroom watching tv and talking to peers. Patient did not attend group this evening. Patient reports to writer at the medication window that she does not how she ended up here. Writer informed patient of report received that possibly got her here and patient reports that is not true. Writer encouraged her to speak with the doctor on tomorrow. Patient appears anxious and is very talkative not only to Clinical research associate but to her roommate and other peers also. Patient curently denies si/hi/a/v hallucinations.

## 2013-06-14 NOTE — Progress Notes (Signed)
Psychoeducational Group Note  Date: 06/14/2013 Time:  0930  Group Topic/Focus:  Gratefulness:  The focus of this group is to help patients identify what two things they are most grateful for in their lives. What helps ground them and to center them on their work to their recovery.  Participation Level:  Partisipated  Participation Quality:  Appropriate  Affect:  Appropriate  Cognitive:  Oriented  Insight:  Improving  Engagement in Group:  Engaged  Additional Comments:    Sharon Compton A  

## 2013-06-14 NOTE — Progress Notes (Signed)
Psychoeducational Group Note  Date:  06/14/2013 Time:  1015  Group Topic/Focus:  Making Healthy Choices:   The focus of this group is to help patients identify negative/unhealthy choices they were using prior to admission and identify positive/healthier coping strategies to replace them upon discharge.  Participation Level:  Active  Participation Quality:  Appropriate  Affect:  Appropriate  Cognitive:  Oriented  Insight:  Improving  Engagement in Group:  Engaged  Additional Comments:    Damauri Minion A 06/14/2013 

## 2013-06-15 DIAGNOSIS — R45851 Suicidal ideations: Secondary | ICD-10-CM

## 2013-06-15 MED ORDER — CLONAZEPAM 0.5 MG PO TABS
0.5000 mg | ORAL_TABLET | Freq: Two times a day (BID) | ORAL | Status: DC
  Administered 2013-06-15: 0.5 mg via ORAL
  Filled 2013-06-15 (×3): qty 1

## 2013-06-15 MED ORDER — FLUOXETINE HCL 20 MG PO CAPS
20.0000 mg | ORAL_CAPSULE | Freq: Every day | ORAL | Status: DC
  Administered 2013-06-15 – 2013-06-17 (×3): 20 mg via ORAL
  Filled 2013-06-15 (×5): qty 1

## 2013-06-15 NOTE — Progress Notes (Signed)
Recreation Therapy Notes  Date: 12.22.2014 Time: 3:00pm Location: 100 [address] Dayroom   Group Topic: Self-Esteem  Goal Area(s) Addresses:  Patient will identify how self-esteem can effect decision making. Patient will identify how self-esteem can effect wellness.    Behavioral Response: Appropriate   Intervention: Art  Activity: Coat of Arms. Patients were asked to identify items to satisfy the following categories: Something I do well, My best trait/feature, Something I value, A turning point in my life, Things I'd like to do, Things I'd like to stop doing.   Education:  Self-Esteem, Wellness, Building control surveyor.   Education Outcome: Acknowledges understanding  Clinical Observations/Feedback: Patient actively engaged in group activity, identifying items to fit each category. Patient contributed to group discussion, connecting her wellness to her self-esteem. Patient additionally highlighted to impact her self-esteem has on her relationship with her husband.    Marykay Lex Galit Urich, LRT/CTRS  Jearl Klinefelter 06/15/2013 4:27 PM

## 2013-06-15 NOTE — Progress Notes (Signed)
Adult Psychoeducational Group Note  Date:  06/15/2013 Time:  10:23 PM  Group Topic/Focus:  Wrap-Up Group:   The focus of this group is to help patients review their daily goal of treatment and discuss progress on daily workbooks.  Participation Level:  Active  Participation Quality:  Appropriate  Affect:  Appropriate  Cognitive:  Appropriate  Insight: Appropriate  Engagement in Group:  Engaged  Modes of Intervention:  Support  Additional Comments:  Pt stated that she learned to love herself today and that she was able to carry on and joke with her peers. She was also supportive of her peers.   Poppy Mcafee 06/15/2013, 10:23 PM

## 2013-06-15 NOTE — Progress Notes (Signed)
Patient ID: Sharon Compton, female   DOB: [birth year], 72 y.o.   MRN: [id number] PER STATE REGULATIONS 482.30  THIS CHART WAS REVIEWED FOR MEDICAL NECESSITY WITH RESPECT TO THE PATIENT'S ADMISSION/ DURATION OF STAY.  NEXT REVIEW DATE: 06/19/2013  Willa Rough, RN, BSN CASE MANAGER

## 2013-06-15 NOTE — Progress Notes (Signed)
Adult Psychoeducational Group Note  Date:  06/15/2013 Time:  4:38 AM  Group Topic/Focus:  Wrap-Up Group:   The focus of this group is to help patients review their daily goal of treatment and discuss progress on daily workbooks.  Participation Level:  Active  Participation Quality:  Appropriate  Affect:  Appropriate  Cognitive:  Alert  Insight: Good  Engagement in Group:  Engaged  Modes of Intervention:  Discussion  Additional Comments:  Patient stated that she had a good day and that she was going home tomorrow. Pt. Says that she had no plans for discharge in place.  [facility] 06/15/2013, 4:38 AM

## 2013-06-15 NOTE — Progress Notes (Signed)
D: Patient in the dayroom eating a snack on approach.  Patient states she had a wonderful day.  Patient states she has spent most of her day laughing and getting to know the other patients.  Patient states she is not going to let what happened in her life stop her.  Patient states her lower extremities bother her.  Patient has 2+ pitting edema to bilateral lower extremities.  Patient states it has been this way for two years.   Patient denies SI/HI and denies AVH.  A: Staff to monitor Q 15 mins for safety.  Encouragement and support offered.  Scheduled medications administered per orders.  Writer encouraged patient to elevate her feet at night. R: Patient remains safe on the unit.  Patient attended group tonight.  Patient visible on the unit and interacting with peers.  Patient taking administered medications.

## 2013-06-15 NOTE — B[facility] Suicide Risk Assessment (Signed)
Suicide Risk Assessment  Admission Assessment     Nursing information obtained from:  Patient Demographic factors:  Age 72 or older;Caucasian Current Mental Status:  NA Loss Factors:  NA Historical Factors:  NA Risk Reduction Factors:  NA  CLINICAL FACTORS:   Severe Anxiety and/or Agitation Depression:   Anhedonia Hopelessness Impulsivity Insomnia Recent sense of peace/wellbeing Severe Unstable or Poor Therapeutic Relationship Previous Psychiatric Diagnoses and Treatments Medical Diagnoses and Treatments/Surgeries  COGNITIVE FEATURES THAT CONTRIBUTE TO RISK:  Closed-mindedness Loss of executive function Polarized thinking    SUICIDE RISK:   Moderate:  Frequent suicidal ideation with limited intensity, and duration, some specificity in terms of plans, no associated intent, good self-control, limited dysphoria/symptomatology, some risk factors present, and identifiable protective factors, including available and accessible social support.  PLAN OF CARE: Admit for crisis stabilization, safety monitoring and medication management for major depressive disorder and posttraumatic stress disorder.  I certify that inpatient services furnished can reasonably be expected to improve the patient's condition.  Caidan Hubbert,JANARDHAHA R. 06/15/2013, 12:51 PM
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
## 2013-06-15 NOTE — Progress Notes (Signed)
Patient ID: Sharon Compton, female   DOB: 08-31-1940, 72 y.o.   MRN: [id number] D-Patient reports her sleep was fair and her appetite, "don't have one".  Her energy level is low and her ability to pay attention is good.  She is rating her depression at 3/10 and her hopelessness at 8/10.  She denies thoughts of self harm.  This am, patient stated "I feel weak, I've never felt this weak.  I don't know what caused it"  This was one cause of her hopeless feeling.  She has felt better as the day progressed.  She is a fall risk. A- Call bell put in place and patient instructed to call if she feels dizzy,  Offered patient a wheelchair to use but she refused.  Offered her a walker which she agreed to use and is using correctly.  She did go to lunch and was able to eat better.  She is no longer feeling nauseated.  Patient's ankles feet and lower legs continue to be very swollen and red. Haig Prophet, NP rechecked her legs today. R- Patient is elevating legs when not in group.  She is disappointed not to be going home today and hopes to go home by Christmas.
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
## 2013-06-15 NOTE — [facility] Group Notes (Signed)
[facility] LCSW Group Therapy        Overcoming Obstacles 1:15 2:30 PM         06/15/2013 3:15 PM  Type of Therapy:  Group Therapy  Participation Level:  Did Not Attend Summary of Progress/Problems:  Wynn Banker 06/15/2013, 3:15 PM

## 2013-06-15 NOTE — [facility] Group Notes (Signed)
[facility] LCSW Aftercare Discharge Planning Group Note   06/15/2013 3:15 PM  Participation Quality:  Patient did not attend group.   Jenasia Dolinar, Joesph July

## 2013-06-16 MED ORDER — DICLOXACILLIN SODIUM 250 MG PO CAPS: 250.0000 mg | ORAL_CAPSULE | Freq: Four times a day (QID) | ORAL | Status: DC

## 2013-06-16 MED ORDER — PANTOPRAZOLE SODIUM 40 MG PO TBEC: 40.0000 mg | DELAYED_RELEASE_TABLET | Freq: Every day | ORAL | Status: DC

## 2013-06-16 MED ORDER — POTASSIUM CHLORIDE CRYS ER 20 MEQ PO TBCR: 20.0000 meq | EXTENDED_RELEASE_TABLET | Freq: Every day | ORAL | Status: DC

## 2013-06-16 MED ORDER — FLUOXETINE HCL 20 MG PO CAPS: 20.0000 mg | ORAL_CAPSULE | Freq: Every day | ORAL | Status: DC

## 2013-06-16 MED ORDER — FUROSEMIDE 20 MG PO TABS: 20.0000 mg | ORAL_TABLET | Freq: Every day | ORAL | Status: DC

## 2013-06-16 MED ORDER — GABAPENTIN 400 MG PO CAPS: 400.0000 mg | ORAL_CAPSULE | Freq: Four times a day (QID) | ORAL | Status: DC

## 2013-06-16 MED ORDER — TRAZODONE HCL 50 MG PO TABS: 50.0000 mg | ORAL_TABLET | Freq: Every evening | ORAL | Status: DC | PRN

## 2013-06-16 MED ORDER — CLONAZEPAM 0.5 MG PO TABS: 0.5000 mg | ORAL_TABLET | Freq: Two times a day (BID) | ORAL | Status: DC

## 2013-06-16 NOTE — Progress Notes (Signed)
Adult Psychoeducational Group Note  Date:  06/16/2013 Time:  11:00am Group Topic/Focus:  Recovery Goals:   The focus of this group is to identify appropriate goals for recovery and establish a plan to achieve them.  Participation Level:  Did Not Attend  Participation Quality:    Affect:    Cognitive:    Insight:   Engagement in Group:    Modes of Intervention:   Additional Comments:  Pt did not attend.  Shelly Bombard D 06/16/2013, 1:41 PM

## 2013-06-16 NOTE — [facility] Group Notes (Deleted)
[facility] LCSW Group Therapy  Emotional Regulation 1:15 - 2: 30 PM        06/16/2013  4:18 PM   Type of Therapy:  Group Therapy  Participation Level:  Appropriate  Participation Quality:  Appropriate  Affect:  Appropriate  Cognitive:  Attentive Appropriate  Insight:  Developing/Improving Engaged  Engagement in Therapy:  Developing/Improving Engaged  Modes of Intervention:  Discussion Exploration Problem-Solving Supportive  Summary of Progress/Problems:  Group topic was emotional regulations.  Patient participated in the discussion and was able to identify an emotion that needed to regulated.  Patient was able to identify approprite coping skills.  Sharon Compton 06/16/2013 4:18 PM

## 2013-06-16 NOTE — Progress Notes (Signed)
Patient ID: Sharon Compton, female   DOB: December 21, 1940, 72 y.o.   MRN: [id number] Patient was able to sleep last night with medication.  Her appetite is improving.  Her energy level is low.  She is rating depression at1/10 and feels that she is ready to go.  She continues to have knee pain because her l knee "is bone on bone.  Her feet continue to be swollen but she says she got up five times last night to void and feels she is passing water well.  A- Offered pat prn lasix this am but she declined. R- Patient talked with MD and although disappointed about not leaving today she has been in good spirits and has shown a sense of humor.

## 2013-06-16 NOTE — [facility] Suicide Risk Assessment (Addendum)
[facility] INPATIENT:  Family/Significant Other Suicide Prevention Education  Suicide Prevention Education:  Patient Refusal for Family/Significant Other Suicide Prevention Education: The patient Sharon Compton has refused to provide written consent for family/significant other to be provided Family/Significant Other Suicide Prevention Education during admission and/or prior to discharge.  Physician notified.  Patient advised her husband has his own mental health problems.  Wynn Banker 06/16/2013, 1:18 PM

## 2013-06-16 NOTE — Progress Notes (Signed)
The focus of this group is to educate the patient on the purpose and policies of crisis stabilization and provide a format to answer questions about their admission.  The group details unit policies and expectations of patients while admitted.  Patient did not attend 0900 nurse education orientation group this morning, patient stayed in bed. 
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
## 2013-06-16 NOTE — [facility] Group Notes (Signed)
[facility] LCSW Group Therapy      Feelings About Diagnosis 1:15 - 2:30 PM         06/16/2013  4:19 PM    Type of Therapy:  Group Therapy  Participation Level:  Active  Participation Quality:  Appropriate  Affect:  Appropriate  Cognitive:  Alert and Appropriate  Insight:  Developing/Improving and Engaged  Engagement in Therapy:  Developing/Improving and Engaged  Modes of Intervention:  Discussion, Education, Exploration, Problem-Solving, Rapport Building, Support  Summary of Progress/Problems:  Patient actively participated in group. Patient discussed past and present diagnosis and the effects it has had on  life.  Patient talked about family and society being judgmental and the stigma associated with having a mental health diagnosis.  She shared she accepts her diagnosis.  Wynn Banker 06/16/2013  4:19 PM

## 2013-06-16 NOTE — Progress Notes (Signed)
Recreation Therapy Notes  Animal-Assisted Activity/Therapy (AAA/T) Program Checklist/Progress Notes Patient Eligibility Criteria Checklist & Daily Group note for Rec Tx Intervention  Date: 12.23.2014 Time: 2:45pm Location: [address]   AAA/T Program Assumption of Risk Form signed by Patient/ or Parent Legal Guardian yes  Patient is free of allergies or sever asthma yes  Patient reports no fear of animals yes  Patient reports no history of cruelty to animals yes   Patient understands his/her participation is voluntary yes  Patient washes hands before animal contact yes  Patient washes hands after animal contact yes  Behavioral Response: Appropriate   Education: Hand Washing, Appropriate Animal Interaction   Education Outcome: Acknowledges understanding  Clinical Observations/Feedback: Patient actively engaged in group session with therapeutic dog team.   Jearl Klinefelter, LRT/CTRS  Leyanna Bittman L 06/16/2013 4:18 PM
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
## 2013-06-16 NOTE — Progress Notes (Signed)
D: Patient in the dayroom on approach.  Patient states she had a better day today.  Patient states she has has been having conversations with her peers.  Patient states she never used to do that.  Patient states, "I love everybody."  Patient denies SI/HI and denies AVH.   A: Staff to monitor Q 15 mins for safety.  Encouragement and support offered.  Scheduled medications administered per orders. R: Patient remains safe on the unit.  Patient attended group tonight.  Patient visible on the unit and interacting with peers.  Patient taking administered medications.

## 2013-06-17 MED ORDER — TRAZODONE HCL 100 MG PO TABS: 100.0000 mg | ORAL_TABLET | Freq: Every evening | ORAL | Status: DC | PRN

## 2013-06-17 NOTE — [facility] Group Notes (Signed)
[facility] LCSW Aftercare Discharge Planning Group Note   06/17/2013 10:31 AM    Participation Quality:  Appropraite  Mood/Affect:  Appropriate  Depression Rating:  1  Anxiety Rating:  1  Thoughts of Suicide:  No  Will you contract for safety?   NA  Current AVH:  No  Plan for Discharge/Comments:  Patient attended discharge planning group and actively participated in group. She reports doing well today and being ready to discharge home today.  She will follow up with [facility].  CSW provided all participants with daily workbook.   Transportation Means: Patient has transportation.   Supports:  Patient has a support system.   Catelyn Friel, Joesph July     Plan for Discharge/Comments:    [employer]:   Supports:  Camauri Fleece, Joesph July

## 2013-06-17 NOTE — Progress Notes (Signed)
Adult Psychoeducational Group Note  Date:  06/17/2013 Time:  11:20 AM  Group Topic/Focus:  Personal Choices and Values:   The focus of this group is to help patients assess and explore the importance of values in their lives, how their values affect their decisions, how they express their values and what opposes their expression.  Participation Level:  Active  Participation Quality:  Appropriate and Attentive  Affect:  Appropriate  Cognitive:  Alert and Appropriate  Insight: Appropriate  Engagement in Group:  Engaged  Modes of Intervention:  Discussion and Education  Additional Comments:  Pt attended group. Discussion was on recognizing depression.    Shelly Bombard D 06/17/2013, 11:20 AM

## 2013-06-17 NOTE — Progress Notes (Signed)
[facility] Adult Case Management Discharge Plan :  Will you be returning to the same living situation after discharge: Yes,  Patient is returning to her home. At discharge, do you have transportation home?:Yes,  Patient to arrange transportation home. Do you have the ability to pay for your medications:Yes,  Patient has [employer] and Medicaid  Release of information consent forms completed and in the chart;  Patient's signature needed at discharge.  Patient to Follow up at:  [facility]   Tuesday, Decembe 30, 2014 at 9 AM  [address]  [place], [place] [phone]  Patient denies SI/HI:   Patient no longer endorsing SI/HI or other thoughts of self harm.     Safety Planning and Suicide Prevention discussed:  .Reviewed with all patients during discharge planning group   Jacorian Golaszewski, Joesph July 06/17/2013, 9:34 AM

## 2013-06-17 NOTE — [facility] Group Notes (Signed)
[facility] LCSW Group Therapy      Feelings About Diagnosis 1:15 - 2:30 PM         06/17/2013  8:38 AM    Type of Therapy:  Group Therapy  Participation Level:  Active  Participation Quality:  Appropriate  Affect:  Appropriate  Cognitive:  Alert and Appropriate  Insight:  Developing/Improving and Engaged  Engagement in Therapy:  Developing/Improving and Engaged  Modes of Intervention:  Discussion, Education, Exploration, Problem-Solving, Rapport Building, Support  Summary of Progress/Problems:  Patient actively participated in group. Patient discussed past and present diagnosis and the effects it has had on  life.  Patient talked about family and society being judgmental and the stigma associated with having a mental health diagnosis.  She shared having mental having diagnosis causes her to feel isolated, as though she is in a box.  Wynn Banker 06/17/2013  8:38 AM

## 2013-06-17 NOTE — [facility] Suicide Risk Assessment (Signed)
Suicide Risk Assessment  Discharge Assessment     Demographic Factors:  Age 72 or older, Caucasian and Low socioeconomic status  Mental Status Per Nursing Assessment::   On Admission:  NA  Current Mental Status by Physician: Patient is calm and cooperative. Patient has good mood with the appropriate a bright and full affect. Patient has normal speech and thought process. Patient has no suicidal, homicidal ideation, intention or plans. Patient has no evidence of psychotic symptoms. Patient has fair insight judgment and impulse control.  Loss Factors: Financial problems/change in socioeconomic status  Historical Factors: Prior suicide attempts, Family history of mental illness or substance abuse and Impulsivity  Risk Reduction Factors:   Sense of responsibility to family, Religious beliefs about death, Living with another person, especially a relative, Positive social support, Positive therapeutic relationship and Positive coping skills or problem solving skills  Continued Clinical Symptoms:  Depression:   Recent sense of peace/wellbeing Previous Psychiatric Diagnoses and Treatments Medical Diagnoses and Treatments/Surgeries  Cognitive Features That Contribute To Risk:  Polarized thinking    Suicide Risk:  Minimal: No identifiable suicidal ideation.  Patients presenting with no risk factors but with morbid ruminations; may be classified as minimal risk based on the severity of the depressive symptoms  Discharge Diagnoses:   AXIS I:  Major Depression, Recurrent severe and Post Traumatic Stress Disorder AXIS II:  Deferred AXIS III:   Past Medical History  Diagnosis Date  . Hypertension   . Anxiety and depression   . Asthma   . Fibromyalgia   . History of pneumonia   . Peripheral edema   . Peptic ulcer disease   . Hiatal hernia   . GERD (gastroesophageal reflux disease)   . A-fib     cardioversion and TEE at [facility]; pt reports DCCV 2014 at [facility] as well.  . AVM  (arteriovenous malformation) of colon 10/01/2011  . Diverticular disease 10/01/2011  . MI, acute, non ST segment elevation 10/02/2011  . GI bleed     ?recurrent--therefore, no anticoagulant  . COPD (chronic obstructive pulmonary disease)    AXIS IV:  economic problems, other psychosocial or environmental problems, problems related to social environment and problems with primary support group AXIS V:  61-70 mild symptoms  Plan Of Care/Follow-up recommendations:  Activity:  As tolerated Diet:  Regular  Is patient on multiple antipsychotic therapies at discharge:  No   Has Patient had three or more failed trials of antipsychotic monotherapy by history:  No  Recommended Plan for Multiple Antipsychotic Therapies: NA  Sharon Compton,JANARDHAHA R. 06/17/2013, 12:30 PM

## 2013-06-17 NOTE — Progress Notes (Signed)
Adult Psychoeducational Group Note  Date:  06/16/2013 Time:  2030  Group Topic/Focus:  Wrap-Up Group:   The focus of this group is to help patients review their daily goal of treatment and discuss progress on daily workbooks.  Participation Level:  Active  Participation Quality:  Appropriate  Affect:  Appropriate  Cognitive:  Appropriate  Insight: Appropriate  Engagement in Group:  Engaged  Modes of Intervention:  Discussion  Additional Comments:  Pt excited to be going home for the holidays and spend it with her husband. Wants to continue to work on healthy communication with family (husband)  Sharon Compton A 06/17/2013, 10:23 AM

## 2013-06-17 NOTE — Progress Notes (Signed)
Pt was discharged home today.  She denied any S/I H/I or A/V hallucinations.    She was given f/u appointment, rx, hotline info booklet, and taxi voucher provided by the case manager.  She voiced understanding to all instructions provided. She declined the need for smoking cessation materials.

## 2013-06-17 NOTE — Tx Team (Signed)
Interdisciplinary Treatment Plan Update   Date Reviewed:  06/17/2013  Time Reviewed:  8:38 AM  Progress in Treatment:   Attending groups: Yes Participating in groups: Yes Taking medication as prescribed: Yes  Tolerating medication: Yes Family/Significant other contact made: No, patient declined collateral contact. Patient understands diagnosis: Yes  Discussing patient identified problems/goals with staff: Yes Medical problems stabilized or resolved: Yes Denies suicidal/homicidal ideation: Yes Patient has not harmed self or others: Yes  For review of initial/current patient goals, please see plan of care.  Estimated Length of Stay:  Discharge today  Reasons for Continued Hospitalization:    New Problems/Goals identified:    Discharge Plan or Barriers:   Home with outpatient follow up with [facility]  Additional Comments:  Attendees:  Patient:  06/17/2013 8:38 AM   Signature: Mervyn Gay, MD 06/17/2013 8:38 AM  Signature:  Robbie Louis, RN 06/17/2013 8:38 AM  Signature:  Richelle Ito, LCSW, Clinical Social Worker 06/17/2013 8:38 AM  Signature: 06/17/2013 8:38 AM  Signature:   06/17/2013 8:38 AM  Signature:  Juline Patch, LCSW 06/17/2013 8:38 AM  Signature:   06/17/2013 8:38 AM  Signature:   06/17/2013 8:38 AM  Signature:   06/17/2013 8:38 AM  Signature:  06/17/2013  8:38 AM  Signature:   06/17/2013  8:38 AM  Signature:   06/17/2013  8:38 AM    Scribe for Treatment Team:   Juline Patch,  06/17/2013 8:38 AM

## 2013-06-22 NOTE — Progress Notes (Signed)
Patient Discharge Instructions:  After Visit Summary (AVS):   Faxed to:  06/22/13 Discharge Summary Note:   Faxed to:  06/22/13 Psychiatric Admission Assessment Note:   Faxed to:  06/22/13 Suicide Risk Assessment - Discharge Assessment:   Faxed to:  06/22/13 Faxed/Sent to the Next Level Care provider:  06/22/13 Faxed to [facility] @ [phone]  Jerelene Redden, 06/22/2013, 4:31 PM

## 2013-07-02 NOTE — Telephone Encounter (Signed)
Routed to Emeline Darling, Therapist, sports.

## 2013-07-09 ENCOUNTER — Other Ambulatory Visit: Payer: Self-pay | Admitting: Nurse Practitioner

## 2013-07-11 NOTE — Telephone Encounter (Signed)
Called our office requesting medication .  She had not been seen at our office since May 2014.    Patient instructed to call her surgeon for nerve medication.

## 2013-07-13 ENCOUNTER — Telehealth: Payer: Self-pay | Admitting: Nurse Practitioner

## 2013-07-13 ENCOUNTER — Other Ambulatory Visit: Payer: Self-pay | Admitting: Nurse Practitioner

## 2013-07-13 NOTE — Telephone Encounter (Signed)
PT NEEDED APPT WITH [facility] FOR [facility] REFILLS APPT SCHEDULED

## 2013-07-16 ENCOUNTER — Ambulatory Visit (INDEPENDENT_AMBULATORY_CARE_PROVIDER_SITE_OTHER): Payer: Medicare Other | Admitting: Nurse Practitioner

## 2013-07-16 ENCOUNTER — Encounter: Payer: Self-pay | Admitting: Nurse Practitioner

## 2013-07-16 VITALS — BP 188/90 | HR 79 | Temp 99.3°F | Ht 60.0 in | Wt 203.0 lb

## 2013-07-16 DIAGNOSIS — F411 Generalized anxiety disorder: Secondary | ICD-10-CM

## 2013-07-16 DIAGNOSIS — R7309 Other abnormal glucose: Secondary | ICD-10-CM | POA: Diagnosis not present

## 2013-07-16 DIAGNOSIS — F419 Anxiety disorder, unspecified: Secondary | ICD-10-CM

## 2013-07-16 DIAGNOSIS — I2510 Atherosclerotic heart disease of native coronary artery without angina pectoris: Secondary | ICD-10-CM | POA: Diagnosis not present

## 2013-07-16 DIAGNOSIS — I10 Essential (primary) hypertension: Secondary | ICD-10-CM | POA: Diagnosis not present

## 2013-07-16 DIAGNOSIS — D649 Anemia, unspecified: Secondary | ICD-10-CM | POA: Diagnosis not present

## 2013-07-16 DIAGNOSIS — E669 Obesity, unspecified: Secondary | ICD-10-CM

## 2013-07-16 DIAGNOSIS — R635 Abnormal weight gain: Secondary | ICD-10-CM

## 2013-07-16 DIAGNOSIS — G4700 Insomnia, unspecified: Secondary | ICD-10-CM | POA: Insufficient documentation

## 2013-07-16 DIAGNOSIS — R609 Edema, unspecified: Secondary | ICD-10-CM

## 2013-07-16 DIAGNOSIS — E876 Hypokalemia: Secondary | ICD-10-CM

## 2013-07-16 DIAGNOSIS — Z79899 Other long term (current) drug therapy: Secondary | ICD-10-CM | POA: Diagnosis not present

## 2013-07-16 DIAGNOSIS — R739 Hyperglycemia, unspecified: Secondary | ICD-10-CM

## 2013-07-16 DIAGNOSIS — E559 Vitamin D deficiency, unspecified: Secondary | ICD-10-CM | POA: Diagnosis not present

## 2013-07-16 MED ORDER — ALPRAZOLAM 1 MG PO TABS: 1.0000 mg | ORAL_TABLET | Freq: Four times a day (QID) | ORAL | Status: DC | PRN

## 2013-07-16 MED ORDER — TRAZODONE HCL 50 MG PO TABS: 50.0000 mg | ORAL_TABLET | Freq: Every evening | ORAL | Status: DC | PRN

## 2013-07-16 MED ORDER — TRAZODONE HCL 50 MG PO TABS: ORAL_TABLET | ORAL | Status: DC

## 2013-07-16 NOTE — Progress Notes (Signed)
Subjective:    Patient ID: Sharon Compton, female    DOB: 29-Jun-1940, 73 y.o.   MRN: [id number]  HPI  Patient here today fro follow up- she is doing well-her feet are really swollen from venous stasis- she is currently on antibiotic for cellulitis of both legs- Doing much better with cellulitis. Patient is on lasix but does not take everyday. Says some days she just doesn't want to take. SHe has  No other complaints today. Patient Active Problem List   Diagnosis Date Noted  . Major depressive disorder, recurrent episode, severe, without mention of psychotic behavior 06/14/2013  . Posttraumatic stress disorder 06/14/2013  . Acute on chronic diastolic CHF (congestive heart failure), NYHA class 3 03/05/2013  . CAD (coronary artery disease), native coronary artery 03/05/2013  . Angina, class III 03/05/2013  . Hypokalemia 03/05/2013  . HTN (hypertension), benign 09/27/2012  . Hyperglycemia 10/03/2011  . MI, acute, non ST segment elevation 10/02/2011  . Asthma 10/01/2011  . Campath-induced atrial fibrillation 10/01/2011  . 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 Diverticulosis 10/01/2011  . Peripheral edema 08/23/2011  . Elevated LFTs 08/22/2011  . Anxiety disorder 08/21/2011  . GI bleed 08/20/2011  . Acute blood loss anemia 08/20/2011  . Obesity 08/20/2011   Outpatient Encounter Prescriptions as of 07/16/2013  Medication Sig  . ALPRAZolam (XANAX) 1 MG tablet Take 1 mg by mouth 4 (four) times daily as needed for anxiety.  [name] aspirin EC 81 MG tablet Take 1 tablet (81 mg total) by mouth daily.  [name] atorvastatin (LIPITOR) 40 MG tablet Take 1 tablet (40 mg total) by mouth daily at 6 PM.  . clopidogrel (PLAVIX) 75 MG tablet Take 1 tablet (75 mg total) by mouth daily with breakfast.  . dicloxacillin (DYNAPEN) 250 MG capsule Take 1 capsule (250 mg total) by mouth every 6 (six) hours.  [name] diltiazem (CARDIZEM CD) 120 MG 24 hr capsule Take 1 capsule (120 mg total) by mouth daily.  .  furosemide (LASIX) 20 MG tablet Take 1 tablet (20 mg total) by mouth daily.  [name] gabapentin (NEURONTIN) 400 MG capsule Take 1 capsule (400 mg total) by mouth 4 (four) times daily.  . iron polysaccharides (NIFEREX) 150 MG capsule Take 1 capsule (150 mg total) by mouth daily.  [name] lisinopril (PRINIVIL,ZESTRIL) 5 MG tablet Take 1 tablet (5 mg total) by mouth daily.  . metoprolol tartrate (LOPRESSOR) 25 MG tablet Take 1 tablet (25 mg total) by mouth 2 (two) times daily.  . nitroGLYCERIN (NITROSTAT) 0.4 MG SL tablet Place 0.4 mg under the tongue every 5 (five) minutes as needed for chest pain.  . pantoprazole (PROTONIX) 40 MG tablet Take 1 tablet (40 mg total) by mouth daily.  . potassium chloride SA (K-DUR,KLOR-CON) 20 MEQ tablet Take 1 tablet (20 mEq total) by mouth daily.  . traZODone (DESYREL) 50 MG tablet Take 1 tablet (50 mg total) by mouth at bedtime as needed for sleep.  . [DISCONTINUED] cholecalciferol (VITAMIN D) 1000 UNITS tablet Take 2 tablets (2,000 Units total) by mouth daily.  . [DISCONTINUED] clonazePAM (KLONOPIN) 0.5 MG tablet Take 1 tablet (0.5 mg total) by mouth 2 (two) times daily.  . [DISCONTINUED] FLUoxetine (PROZAC) 20 MG capsule Take 1 capsule (20 mg total) by mouth daily.       Review of Systems  Constitutional: Negative.   HENT: Negative.   Respiratory: Negative.   Cardiovascular: Positive for leg swelling.  Gastrointestinal: Negative.   Genitourinary: Negative.   Musculoskeletal: Negative.  All other systems reviewed and are negative.       Objective:   Physical Exam  Constitutional: She is oriented to person, place, and time. She appears well-developed and well-nourished.  HENT:  Nose: Nose normal.  Mouth/Throat: Oropharynx is clear and moist.  Eyes: EOM are normal.  Neck: Trachea normal, normal range of motion and full passive range of motion without pain. Neck supple. No JVD present. Carotid bruit is not present. No thyromegaly present.  Cardiovascular:  Normal rate, regular rhythm, normal heart sounds and intact distal pulses.  Exam reveals no gallop and no friction rub.   No murmur heard. Pulmonary/Chest: Effort normal and breath sounds normal.  Abdominal: Soft. Bowel sounds are normal. She exhibits no distension and no mass. There is no tenderness.  Musculoskeletal: Normal range of motion. She exhibits edema (3+ edema bil feet).  Lymphadenopathy:    She has no cervical adenopathy.  Neurological: She is alert and oriented to person, place, and time. She has normal reflexes.  Skin: Skin is warm and dry.  Mild erythema bil lower ext.  Psychiatric: She has a normal mood and affect. Her behavior is normal. Judgment and thought content normal.   BP 188/90  Pulse 79  Temp(Src) 99.3 F (37.4 C) (Oral)  Ht 5' (1.524 m)  Wt 203 lb (92.08 kg)  BMI 39.65 kg/m2        Assessment & Plan:   1. Peripheral edema   2. Obesity   3. Hypokalemia   4. Hyperglycemia   5. HTN (hypertension), benign   6. CAD (coronary artery disease), native coronary artery   7. Anxiety disorder   8. Insomnia   9. Anemia    Orders Placed This Encounter  Procedures  . Anemia Profile B  . CMP14+EGFR  . NMR, lipoprofile   Meds ordered this encounter  Medications  . DISCONTD: ALPRAZolam (XANAX) 1 MG tablet    Sig: Take 1 mg by mouth 4 (four) times daily as needed for anxiety.  . ALPRAZolam (XANAX) 1 MG tablet    Sig: Take 1 tablet (1 mg total) by mouth 4 (four) times daily as needed for anxiety.    Dispense:  120 tablet    Refill:  0    Order Specific Question:  Supervising Provider    Answer:  Chipper Herb [[id number]]  . traZODone (DESYREL) 50 MG tablet    Sig: Take 1 tablet (50 mg total) by mouth at bedtime as needed for sleep.    Dispense:  30 tablet    Refill:  0    Order Specific Question:  Supervising Provider    Answer:  Durward Parcel [[id number]]   Patient told to take her lasix BID everyday and elevate legs when sitting Labs  pending Health maintenance reviewed Diet and exercise encouraged Continue all meds Follow up  In 3 months   [place], FNP

## 2013-07-16 NOTE — Patient Instructions (Signed)
Stress Management Stress is a state of physical or mental tension that often results from changes in your life or normal routine. Some common causes of stress are:  Death of a loved one.  Injuries or severe illnesses.  Getting fired or changing jobs.  Moving into a new home. Other causes may be:  Sexual problems.  Business or financial losses.  Taking on a large debt.  Regular conflict with someone at home or at work.  Constant tiredness from lack of sleep. It is not just bad things that are stressful. It may be stressful to:  Win the lottery.  Get married.  Buy a new car. The amount of stress that can be easily tolerated varies from person to person. Changes generally cause stress, regardless of the types of change. Too much stress can affect your health. It may lead to physical or emotional problems. Too little stress (boredom) may also become stressful. SUGGESTIONS TO REDUCE STRESS:  Talk things over with your family and friends. It often is helpful to share your concerns and worries. If you feel your problem is serious, you may want to get help from a professional counselor.  Consider your problems one at a time instead of lumping them all together. Trying to take care of everything at once may seem impossible. List all the things you need to do and then start with the most important one. Set a goal to accomplish 2 or 3 things each day. If you expect to do too many in a single day you will naturally fail, causing you to feel even more stressed.  Do not use alcohol or drugs to relieve stress. Although you may feel better for a short time, they do not remove the problems that caused the stress. They can also be habit forming.  Exercise regularly - at least 3 times per week. Physical exercise can help to relieve that "uptight" feeling and will relax you.  The shortest distance between despair and hope is often a good night's sleep.  Go to bed and get up on time allowing  yourself time for appointments without being rushed.  Take a short "time-out" period from any stressful situation that occurs during the day. Close your eyes and take some deep breaths. Starting with the muscles in your face, tense them, hold it for a few seconds, then relax. Repeat this with the muscles in your neck, shoulders, hand, stomach, back and legs.  Take good care of yourself. Eat a balanced diet and get plenty of rest.  Schedule time for having fun. Take a break from your daily routine to relax. HOME CARE INSTRUCTIONS   Call if you feel overwhelmed by your problems and feel you can no longer manage them on your own.  Return immediately if you feel like hurting yourself or someone else. Document Released: 12/05/2000 Document Revised: 09/03/2011 Document Reviewed: 02/03/2013 [employer] Patient Information 2014 [employer].  

## 2013-07-17 LAB — NMR, LIPOPROFILE
CHOLESTEROL: 177 mg/dL (ref <200)
HDL Cholesterol by NMR: 47 mg/dL (ref >=40)
HDL Particle Number: 28 umol/L — ABNORMAL LOW (ref >=30.5)
LDL Particle Number: 1716 nmol/L — ABNORMAL HIGH (ref <1000)
LDL Size: 20.7 nm (ref >20.5)
LDLC SERPL CALC-MCNC: 100 mg/dL — ABNORMAL HIGH (ref <100)
LP-IR Score: 45 (ref <=45)
SMALL LDL PARTICLE NUMBER: 986 nmol/L — AB (ref <=527)
TRIGLYCERIDES BY NMR: 149 mg/dL (ref <150)

## 2013-07-17 LAB — ANEMIA PROFILE B
BASOS ABS: 0.1 10*3/uL (ref 0.0–0.2)
Basos: 1 %
Eos: 2 %
Eosinophils Absolute: 0.1 10*3/uL (ref 0.0–0.4)
Ferritin: 16 ng/mL (ref 15–150)
Folate: 14.8 ng/mL (ref >3.0)
HEMATOCRIT: 38.5 % (ref 34.0–46.6)
Hemoglobin: 12.6 g/dL (ref 11.1–15.9)
Immature Grans (Abs): 0 10*3/uL (ref 0.0–0.1)
Immature Granulocytes: 0 %
Iron Saturation: 21 % (ref 15–55)
Iron: 71 ug/dL (ref 35–155)
LYMPHS ABS: 2.7 10*3/uL (ref 0.7–3.1)
Lymphs: 46 %
MCH: 26.3 pg — ABNORMAL LOW (ref 26.6–33.0)
MCHC: 32.7 g/dL (ref 31.5–35.7)
MCV: 80 fL (ref 79–97)
MONOCYTES: 7 %
Monocytes Absolute: 0.4 10*3/uL (ref 0.1–0.9)
NEUTROS ABS: 2.7 10*3/uL (ref 1.4–7.0)
Neutrophils Relative %: 44 %
Platelets: 212 10*3/uL (ref 150–379)
RBC: 4.8 x10E6/uL (ref 3.77–5.28)
RDW: 16 % — ABNORMAL HIGH (ref 12.3–15.4)
Retic Ct Pct: 1.9 % (ref 0.6–2.6)
TIBC: 332 ug/dL (ref 250–450)
UIBC: 261 ug/dL (ref 150–375)
Vitamin B-12: 573 pg/mL (ref 211–946)
WBC: 6 10*3/uL (ref 3.4–10.8)

## 2013-07-17 LAB — CMP14+EGFR
A/G RATIO: 1.8 (ref 1.1–2.5)
ALBUMIN: 4.2 g/dL (ref 3.5–4.8)
ALT: 11 IU/L (ref 0–32)
AST: 18 IU/L (ref 0–40)
Alkaline Phosphatase: 92 IU/L (ref 39–117)
BUN/Creatinine Ratio: 13 (ref 11–26)
BUN: 12 mg/dL (ref 8–27)
CO2: 26 mmol/L (ref 18–29)
CREATININE: 0.91 mg/dL (ref 0.57–1.00)
Calcium: 9.2 mg/dL (ref 8.7–10.3)
Chloride: 101 mmol/L (ref 97–108)
GFR calc non Af Amer: 63 mL/min/{1.73_m2} (ref >59)
GFR, EST AFRICAN AMERICAN: 73 mL/min/{1.73_m2} (ref >59)
GLUCOSE: 85 mg/dL (ref 65–99)
Globulin, Total: 2.4 g/dL (ref 1.5–4.5)
POTASSIUM: 3.9 mmol/L (ref 3.5–5.2)
Sodium: 143 mmol/L (ref 134–144)
TOTAL PROTEIN: 6.6 g/dL (ref 6.0–8.5)
Total Bilirubin: 0.3 mg/dL (ref 0.0–1.2)

## 2013-07-28 ENCOUNTER — Other Ambulatory Visit: Payer: Self-pay | Admitting: Nurse Practitioner

## 2013-08-05 ENCOUNTER — Telehealth: Payer: Self-pay | Admitting: Nurse Practitioner

## 2013-08-07 NOTE — Telephone Encounter (Signed)
Wants you to refill it for her

## 2013-08-07 NOTE — Telephone Encounter (Signed)
Dr.Jamison put her on an antibiotic dicloxacill 250mg  for her legs

## 2013-08-09 NOTE — Telephone Encounter (Signed)
ntbs

## 2013-08-10 NOTE — Telephone Encounter (Signed)
Patient completed antibiotics 2 weeks ago.  She continues to have some leg swelling and blistering. Appt scheduled for 2/18. Patient will go to the ED if symptoms worsen.

## 2013-08-12 ENCOUNTER — Ambulatory Visit: Payer: Medicare Other | Admitting: Nurse Practitioner

## 2013-08-13 ENCOUNTER — Other Ambulatory Visit: Payer: Self-pay | Admitting: Nurse Practitioner

## 2013-08-14 NOTE — Telephone Encounter (Signed)
Called into pharmacy

## 2013-08-14 NOTE — Telephone Encounter (Signed)
Please call in xanax with 1 refills 

## 2013-08-27 ENCOUNTER — Telehealth: Payer: Self-pay

## 2013-08-27 NOTE — Telephone Encounter (Signed)
Patient called trying to find Baldo Ash, asking about a yellow pill-R333, one in the am and one in the pm, that she is supposed to be taking and Suanne Marker would know.     She is not specific about question, states has refill, but not sure why she has to take them and states [facility] is confused.  Transferring call the LPN.

## 2013-08-27 NOTE — Telephone Encounter (Signed)
Pt is really confused about her medications. She wanted to know about one certain pill. This nurse looked up the pill and and looked at her medication list. Pantoprozole looks like it needs to be refilled. Pharmacist states the pt has been taking the medications out of the bubble pack and puts in old bottle containers. This nurse will forward this note to doctor Laurance Flatten, as this is a concern that pt appears confused even using the self pre packaged pills.

## 2013-09-02 ENCOUNTER — Other Ambulatory Visit: Payer: Self-pay | Admitting: Nurse Practitioner

## 2013-09-03 ENCOUNTER — Telehealth: Payer: Self-pay | Admitting: *Deleted

## 2013-09-03 NOTE — Telephone Encounter (Signed)
PT NEEDS RX FOR DILTIAZEM 120 MG CALLED IN TO [facility] IN [place]. THIS IS A NEW RX FROM HOSPITAL PER PT.

## 2013-09-04 ENCOUNTER — Telehealth: Payer: Self-pay | Admitting: Adult Health

## 2013-09-04 MED ORDER — DILTIAZEM HCL ER COATED BEADS 120 MG PO CP24: 120.0000 mg | ORAL_CAPSULE | Freq: Every day | ORAL | Status: DC

## 2013-09-04 NOTE — Telephone Encounter (Signed)
cardizem refilled.

## 2013-09-04 NOTE — Telephone Encounter (Signed)
Patient needs refill Diltiazem sent to [facility] in [place]/tgs

## 2013-09-10 ENCOUNTER — Encounter: Payer: Self-pay | Admitting: Adult Health

## 2013-09-10 ENCOUNTER — Ambulatory Visit (HOSPITAL_COMMUNITY)
Admission: RE | Admit: 2013-09-10 | Discharge: 2013-09-10 | Disposition: A | Discharge status: Home / Self Care | Payer: Medicare Other | Source: Ambulatory Visit | Attending: Adult Health | Admitting: Adult Health

## 2013-09-10 ENCOUNTER — Ambulatory Visit (INDEPENDENT_AMBULATORY_CARE_PROVIDER_SITE_OTHER): Payer: Medicare Other | Admitting: Adult Health

## 2013-09-10 VITALS — BP 157/84 | HR 64 | Ht 60.0 in | Wt 200.0 lb

## 2013-09-10 DIAGNOSIS — R059 Cough, unspecified: Secondary | ICD-10-CM

## 2013-09-10 DIAGNOSIS — R609 Edema, unspecified: Secondary | ICD-10-CM

## 2013-09-10 DIAGNOSIS — E8779 Other fluid overload: Secondary | ICD-10-CM | POA: Diagnosis not present

## 2013-09-10 DIAGNOSIS — R05 Cough: Secondary | ICD-10-CM | POA: Insufficient documentation

## 2013-09-10 DIAGNOSIS — I2510 Atherosclerotic heart disease of native coronary artery without angina pectoris: Secondary | ICD-10-CM | POA: Diagnosis not present

## 2013-09-10 DIAGNOSIS — I10 Essential (primary) hypertension: Secondary | ICD-10-CM

## 2013-09-10 DIAGNOSIS — I5033 Acute on chronic diastolic (congestive) heart failure: Secondary | ICD-10-CM

## 2013-09-10 DIAGNOSIS — I509 Heart failure, unspecified: Secondary | ICD-10-CM

## 2013-09-10 MED ORDER — METOPROLOL TARTRATE 25 MG PO TABS: 25.0000 mg | ORAL_TABLET | Freq: Every day | ORAL | Status: DC

## 2013-09-10 MED ORDER — TORSEMIDE 20 MG PO TABS: 20.0000 mg | ORAL_TABLET | Freq: Every day | ORAL | Status: DC

## 2013-09-10 NOTE — Progress Notes (Signed)
HPI: [name] is a 73 year old patient be est. with Dr. Pennelope Bracken workup on for ongoing assessment and management of hypertension, chronic diastolic CHF, CAD, atrial fibrillation not on anticoagulation due to GI bleed.    The patient was last seen in the office in September of 2014 after undergoing cardiac catheterization. This is a right and left heart catheterization demonstrating severe single-vessel CAD of the right coronary artery with a 99% stenosis followed by an 80% stenosis just before the bifurcation. She discussed we will PTCA with bare-metal stent times one placed to the RCA.      She was continued on aspirin Plavix for 30 days for longer she were tolerating it. She was continued on dual antiplatelet therapy when seen last and she was tolerating medications well.   Denied complaints, to include bruising, and generalized fatigue. She talks openly about major psychological issues she is experiencing, along with posttraumatic stress disorder. The patient denies any recurrent chest pain, but is complaining of lower extremity edema which has worsened.  Allergies  Allergen Reactions  . Dye Fdc Red [Red Dye]   . Sulfa Antibiotics Itching  . Codeine Itching and Palpitations    Current Outpatient Prescriptions  Medication Sig Dispense Refill  . ALPRAZolam (XANAX) 1 MG tablet TAKE ONE TABLET BY MOUTH 4 TIMES DAILY AS NEEDED FOR ANXIETY  120 tablet  0  . aspirin EC 81 MG tablet Take 1 tablet (81 mg total) by mouth daily.  30 tablet  0  . atorvastatin (LIPITOR) 40 MG tablet Take 1 tablet (40 mg total) by mouth daily at 6 PM.  30 tablet  11  . clopidogrel (PLAVIX) 75 MG tablet Take 1 tablet (75 mg total) by mouth daily with breakfast.  30 tablet  11  . dicloxacillin (DYNAPEN) 250 MG capsule Take 1 capsule (250 mg total) by mouth every 6 (six) hours.  28 capsule  0  . diltiazem (CARDIZEM CD) 120 MG 24 hr capsule Take 1 capsule (120 mg total) by mouth daily.  90 capsule  3  . gabapentin  (NEURONTIN) 400 MG capsule Take 1 capsule (400 mg total) by mouth 4 (four) times daily.  120 capsule  0  . gabapentin (NEURONTIN) 400 MG capsule TAKE ONE CAPSULE BY MOUTH 4 TIMES DAILY  120 capsule  0  . iron polysaccharides (NIFEREX) 150 MG capsule Take 1 capsule (150 mg total) by mouth daily.  30 capsule    . lisinopril (PRINIVIL,ZESTRIL) 5 MG tablet Take 1 tablet (5 mg total) by mouth daily.  30 tablet  6  . metoprolol tartrate (LOPRESSOR) 25 MG tablet Take 1 tablet (25 mg total) by mouth 2 (two) times daily.      . nitroGLYCERIN (NITROSTAT) 0.4 MG SL tablet Place 0.4 mg under the tongue every 5 (five) minutes as needed for chest pain.      . pantoprazole (PROTONIX) 40 MG tablet Take 1 tablet (40 mg total) by mouth daily.  30 tablet  0  . potassium chloride SA (K-DUR,KLOR-CON) 20 MEQ tablet Take 1 tablet (20 mEq total) by mouth daily.  30 tablet  3  . traZODone (DESYREL) 50 MG tablet 2 po qhs  60 tablet  3   No current facility-administered medications for this visit.    Past Medical History  Diagnosis Date  . Hypertension   . Anxiety and depression   . Asthma   . Fibromyalgia   . History of pneumonia   . Peripheral edema   . Peptic  ulcer disease   . Hiatal hernia   . GERD (gastroesophageal reflux disease)   . A-fib     cardioversion and TEE at [facility]; pt reports D[facility]V 2014 at [facility] as well.  . 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 Diverticular disease 10/01/2011  . MI, acute, non ST segment elevation 10/02/2011  . GI bleed     ?recurrent--therefore, no anticoagulant  . COPD (chronic obstructive pulmonary disease)     Past Surgical History  Procedure Laterality Date  . Abdominal hysterectomy  age 39    nonmalignant reason  . Cholecystectomy    . Abdominal exploration surgery    . Abd tumor removed      states was 10 lbs, benign  . Knee surgery    . Bladder stent    . Esophagogastroduodenoscopy  08/24/2011    Procedure: ESOPHAGOGASTRODUODENOSCOPY (EGD);   Surgeon: Daneil Dolin, MD;  Location: [facility];  Service: Endoscopy;  Laterality: N/A;  give phenergan 12.5mg  iv 30 mins prior to procedure  . Colonoscopy  08/25/2011    Procedure: COLONOSCOPY;  Surgeon: Daneil Dolin, MD;  Location: [facility];  Service: Endoscopy;  Laterality: N/A;  . Colonoscopy  10/03/2011    Procedure: COLONOSCOPY;  Surgeon: Daneil Dolin, MD;  Location: [facility];  Service: Endoscopy;  Laterality: N/A;  NEEDS PHENERGAN 25 MG IV ON CALL  . Colonoscopy  10/04/2011    Procedure: COLONOSCOPY;  Surgeon: Daneil Dolin, MD;  Location: [facility];  Service: Endoscopy;  Laterality: N/A;  Phenergan 12.5 mg ON CALL  . Transthoracic echocardiogram  09/2011    EF 60-65%, septal hypokinesia  . Cardiovascular stress test  09/2011    equivocal result, most likely low risk; pt refused the recommended cardiac cath to follow this up.    TIW:PYKDXI of systems complete and found to be negative unless listed above  PHYSICAL EXAM BP 157/84  Pulse 64  Ht 5' (1.524 m)  Wt 200 lb (90.719 kg)  BMI 39.06 kg/m2  General: Well developed, well nourished, in no acute distress, obese. Head: Eyes PERRLA, No xanthomas.   Normal cephalic and atramatic  Lungs: Bilateral crackles, not cleared with coughing. No wheezes or rhonchi. Heart: HRRR S1 S2, without MRG.  Pulses are 2+ & equal.            No carotid bruit. No JVD.  No abdominal bruits. No femoral bruits. Abdomen: Bowel sounds are positive, abdomen soft and non-tender without masses or                  Hernia's noted. Msk:  Back normal, normal gait. Normal strength and tone for age. Extremities: No clubbing, cyanosis, 1+ pitting pretibial edema. Venous stasis skin changes and thickening pretibial as well some bruising on the forearms. Neuro: Alert and oriented X 3. Psych:  Good affect, responds appropriately    ASSESSMENT AND PLAN

## 2013-09-10 NOTE — Patient Instructions (Signed)
Your physician recommends that you schedule a follow-up appointment in: 6 months with Dr Virgina Jock will receive a reminder letter two months in advance reminding you to call and schedule your appointment. If you don't receive this letter, please contact our office.  A chest x-ray takes a picture of the organs and structures inside the chest, including the heart, lungs, and blood vessels. This test can show several things, including, whether the heart is enlarges; whether fluid is building up in the lungs; and whether pacemaker / defibrillator leads are still in place.  Your physician recommends that you return for lab work today. BMET, Pro-BNP  Your physician has recommended you make the following change in your medication:  1. Stop Lasix Start Torsemide 20 mg Daily 2. Take Metoprolol 25 mg at bed time

## 2013-09-10 NOTE — Assessment & Plan Note (Signed)
No recurrent chest pain currently. She is complaining of overall fatigue. Will change her metoprolol 25 mg twice a day 2 metoprolol 25 mg XL which he can take at night.  This may be helpful for daytime fatigue. She is provided with a 90 day supply of her medications.  Concerning dual antiplatelet therapy, she wishes to remain on Plavix at this time. Can consider taking her off of it in September and she will have been on a one year. She was initially to be on it for one month, but was recommended for longer, with multiple stents. We will readdress this in September of 2015 on followup appointment.

## 2013-09-10 NOTE — Assessment & Plan Note (Signed)
She has gained approximately 6 pounds since being seen last in December. She is complaining of lower extremity edema and there is congestion noted in her lungs on exam. She states she is not getting a lot of results from her Lasix tablets. I am not certain how compliant she is concerned her low sodium diet.  I will change her Lasix to torsemide 20 mg daily for better bioavailability. A followup BMET pro BNP and chest x-ray is ordered. She will followup in 6 months unless symptomatic.

## 2013-09-10 NOTE — Progress Notes (Deleted)
Name: Sharon Compton    DOB: 07-09-1940  Age: 73 y.o.  MR#: [id number]       PCP:  Redge Gainer, MD      Insurance: Payor: MEDICARE / Plan: MEDICARE PART A AND B / Product Type: *No Product type* /   CC:    Chief Complaint  Patient presents with  . Coronary Artery Disease  . Hypertension  . Congestive Heart Failure    VS Filed Vitals:   09/10/13 1255  BP: 157/84  Pulse: 64  Height: 5' (1.524 m)  Weight: 200 lb (90.719 kg)    Weights Current Weight  09/10/13 200 lb (90.719 kg)  07/16/13 203 lb (92.08 kg)  06/12/13 194 lb (87.998 kg)    Blood Pressure  BP Readings from Last 3 Encounters:  09/10/13 157/84  07/16/13 188/90  06/17/13 125/81     Admit date:  (Not on file) Last encounter with RMR:  09/04/2013   Allergy Dye fdc red; Sulfa antibiotics; and Codeine  Current Outpatient Prescriptions  Medication Sig Dispense Refill  . ALPRAZolam (XANAX) 1 MG tablet TAKE ONE TABLET BY MOUTH 4 TIMES DAILY AS NEEDED FOR ANXIETY  120 tablet  0  . aspirin EC 81 MG tablet Take 1 tablet (81 mg total) by mouth daily.  30 tablet  0  . atorvastatin (LIPITOR) 40 MG tablet Take 1 tablet (40 mg total) by mouth daily at 6 PM.  30 tablet  11  . clopidogrel (PLAVIX) 75 MG tablet Take 1 tablet (75 mg total) by mouth daily with breakfast.  30 tablet  11  . dicloxacillin (DYNAPEN) 250 MG capsule Take 1 capsule (250 mg total) by mouth every 6 (six) hours.  28 capsule  0  . diltiazem (CARDIZEM CD) 120 MG 24 hr capsule Take 1 capsule (120 mg total) by mouth daily.  90 capsule  3  . furosemide (LASIX) 20 MG tablet Take 1 tablet (20 mg total) by mouth daily.  30 tablet  0  . gabapentin (NEURONTIN) 400 MG capsule Take 1 capsule (400 mg total) by mouth 4 (four) times daily.  120 capsule  0  . gabapentin (NEURONTIN) 400 MG capsule TAKE ONE CAPSULE BY MOUTH 4 TIMES DAILY  120 capsule  0  . iron polysaccharides (NIFEREX) 150 MG capsule Take 1 capsule (150 mg total) by mouth daily.  30 capsule    .  lisinopril (PRINIVIL,ZESTRIL) 5 MG tablet Take 1 tablet (5 mg total) by mouth daily.  30 tablet  6  . metoprolol tartrate (LOPRESSOR) 25 MG tablet Take 1 tablet (25 mg total) by mouth 2 (two) times daily.      . nitroGLYCERIN (NITROSTAT) 0.4 MG SL tablet Place 0.4 mg under the tongue every 5 (five) minutes as needed for chest pain.      . pantoprazole (PROTONIX) 40 MG tablet Take 1 tablet (40 mg total) by mouth daily.  30 tablet  0  . potassium chloride SA (K-DUR,KLOR-CON) 20 MEQ tablet Take 1 tablet (20 mEq total) by mouth daily.  30 tablet  3  . traZODone (DESYREL) 50 MG tablet 2 po qhs  60 tablet  3   No current facility-administered medications for this visit.    Discontinued Meds:   There are no discontinued medications.  Patient Active Problem List   Diagnosis Date Noted  . Insomnia 07/16/2013  . Major depressive disorder, recurrent episode, severe, without mention of psychotic behavior 06/14/2013  . Posttraumatic stress disorder 06/14/2013  . Acute on chronic  diastolic CHF (congestive heart failure), NYHA class 3 03/05/2013  . CAD (coronary artery disease), native coronary artery 03/05/2013  . Angina, class III 03/05/2013  . Hypokalemia 03/05/2013  . HTN (hypertension), benign 09/27/2012  . Hyperglycemia 10/03/2011  . MI, acute, non ST segment elevation 10/02/2011  . Asthma 10/01/2011  . Campath-induced atrial fibrillation 10/01/2011  . 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 Diverticulosis 10/01/2011  . Peripheral edema 08/23/2011  . Elevated LFTs 08/22/2011  . Anxiety disorder 08/21/2011  . GI bleed 08/20/2011  . Acute blood loss anemia 08/20/2011  . Obesity 08/20/2011    LABS    Component Value Date/Time   NA 143 07/16/2013 1449   NA 143 06/12/2013 1217   NA 140 03/05/2013 0515   NA 137 03/04/2013 0635   K 3.9 07/16/2013 1449   K 3.8 06/12/2013 1217   K 3.1* 03/05/2013 0515   CL 101 07/16/2013 1449   CL 105 06/12/2013 1217   CL 99 03/05/2013 0515    CO2 26 07/16/2013 1449   CO2 30 06/12/2013 1217   CO2 35* 03/05/2013 0515   GLUCOSE 85 07/16/2013 1449   GLUCOSE 142* 06/12/2013 1217   GLUCOSE 111* 03/05/2013 0515   GLUCOSE 111* 03/04/2013 0635   BUN 12 07/16/2013 1449   BUN 14 06/12/2013 1217   BUN 22 03/05/2013 0515   BUN 16 03/04/2013 0635   CREATININE 0.91 07/16/2013 1449   CREATININE 0.88 06/12/2013 1217   CREATININE 1.30* 03/05/2013 0515   CALCIUM 9.2 07/16/2013 1449   CALCIUM 9.2 06/12/2013 1217   CALCIUM 8.9 03/05/2013 0515   GFRNONAA 63 07/16/2013 1449   GFRNONAA 64* 06/12/2013 1217   GFRNONAA 40* 03/05/2013 0515   GFRAA 73 07/16/2013 1449   GFRAA 74* 06/12/2013 1217   GFRAA 46* 03/05/2013 0515   CMP     Component Value Date/Time   NA 143 07/16/2013 1449   NA 143 06/12/2013 1217   K 3.9 07/16/2013 1449   CL 101 07/16/2013 1449   CO2 26 07/16/2013 1449   GLUCOSE 85 07/16/2013 1449   GLUCOSE 142* 06/12/2013 1217   BUN 12 07/16/2013 1449   BUN 14 06/12/2013 1217   CREATININE 0.91 07/16/2013 1449   CALCIUM 9.2 07/16/2013 1449   PROT 6.6 07/16/2013 1449   PROT 7.4 09/25/2012 1435   ALBUMIN 3.8 09/25/2012 1435   AST 18 07/16/2013 1449   ALT 11 07/16/2013 1449   ALKPHOS 92 07/16/2013 1449   BILITOT 0.3 07/16/2013 1449   GFRNONAA 63 07/16/2013 1449   GFRAA 73 07/16/2013 1449       Component Value Date/Time   WBC 6.0 07/16/2013 1449   WBC 4.8 06/12/2013 1217   WBC 10.6* 03/04/2013 0635   WBC 7.8 02/26/2013 1511   HGB 12.6 07/16/2013 1449   HGB 13.4 06/12/2013 1217   HGB 12.1 03/04/2013 0635   HCT 38.5 07/16/2013 1449   HCT 41.8 06/12/2013 1217   HCT 36.8 03/04/2013 0635   MCV 80 07/16/2013 1449   MCV 84.3 06/12/2013 1217   MCV 82.1 03/04/2013 0635    Lipid Panel     Component Value Date/Time   CHOL 177 07/16/2013 1449   TRIG 111 10/02/2011 0826   HDL 47 10/02/2011 0826   CHOLHDL 4.3 10/02/2011 0826   VLDL 22 10/02/2011 0826   LDLCALC 131* 10/02/2011 0826    ABG    Component Value Date/Time   PHART 7.377 03/03/2013 1228   PCO2ART 46.8*  03/03/2013 1228  PO2ART 56.0* 03/03/2013 1228   HCO3 27.5* 03/03/2013 1228   TCO2 29 03/03/2013 1228   ACIDBASEDEF 2.0 03/03/2013 1226   O2SAT 88.0 03/03/2013 1228     Lab Results  Component Value Date   TSH 2.53 09/25/2012   BNP (last 3 results) No results found for this basename: PROBNP,  in the last 8760 hours Cardiac Panel (last 3 results) No results found for this basename: CKTOTAL, CKMB, TROPONINI, RELINDX,  in the last 72 hours  Iron/TIBC/Ferritin    Component Value Date/Time   IRON 71 07/16/2013 1449   TIBC 332 07/16/2013 1449   FERRITIN 16 07/16/2013 1449     EKG Orders placed in visit on 03/19/13  . EKG 12-LEAD     Prior Assessment and Plan Problem List as of 09/10/2013     Cardiovascular and Mediastinum   Campath-induced atrial fibrillation   AVM (arteriovenous malformation) of colon   MI, acute, non ST segment elevation   HTN (hypertension), benign   Last Assessment & Plan   03/19/2013 Office Visit Written 03/19/2013  2:08 PM by Lendon Colonel, NP     Blood pressure is well controlled currently. Continue current regimen.    Acute on chronic diastolic CHF (congestive heart failure), NYHA class 3   Last Assessment & Plan   03/19/2013 Office Visit Written 03/19/2013  2:09 PM by Lendon Colonel, NP     She appears well compensated today. Lungs are clear and no over LEE, but she does have a lot of obesity in LE. She continues to diurese on Lasix with potassium replacement. She is avoiding salt.     CAD (coronary artery disease), native coronary artery   Last Assessment & Plan   03/19/2013 Office Visit Written 03/19/2013  2:08 PM by Lendon Colonel, NP     She is without complaint of chest pain or DOE. She is obese, so there is some component of mild dyspnea with exertion. She denies bleeding issues on DAPT. Will leave her on this as she is tolerating it so well. Will see her in 3 months unless symptomatic.    Angina, class III     Respiratory   Asthma     Digestive    Diverticulosis   GI bleed     Other   Obesity   Anxiety disorder   Acute blood loss anemia   Elevated LFTs   Peripheral edema   Last Assessment & Plan   09/25/2012 Office Visit Written 09/27/2012  2:33 PM by Tammi Sou, MD     Chronic LE venous insufficiency. Check labs and if lytes/cr ok then will titrate diuretic up a bit. Reviewed low Na diet, elevation of legs regularly.     Hyperglycemia   Hypokalemia   Major depressive disorder, recurrent episode, severe, without mention of psychotic behavior   Posttraumatic stress disorder   Insomnia       Imaging: No results found.

## 2013-09-11 ENCOUNTER — Encounter: Payer: Self-pay | Admitting: *Deleted

## 2013-09-11 LAB — BASIC METABOLIC PANEL
BUN: 17 mg/dL (ref 6–23)
CHLORIDE: 103 meq/L (ref 96–112)
CO2: 28 mEq/L (ref 19–32)
CREATININE: 0.98 mg/dL (ref 0.50–1.10)
Calcium: 8.8 mg/dL (ref 8.4–10.5)
Glucose, Bld: 89 mg/dL (ref 70–99)
Potassium: 3.8 mEq/L (ref 3.5–5.3)
Sodium: 144 mEq/L (ref 135–145)

## 2013-09-15 ENCOUNTER — Telehealth: Payer: Self-pay | Admitting: Nurse Practitioner

## 2013-09-15 NOTE — Telephone Encounter (Signed)
Spoke with patient and encouraged Mammogram

## 2013-09-16 ENCOUNTER — Telehealth: Payer: Self-pay | Admitting: Nurse Practitioner

## 2013-09-16 NOTE — Telephone Encounter (Signed)
Pt aware.

## 2013-09-24 ENCOUNTER — Telehealth: Payer: Self-pay | Admitting: Adult Health

## 2013-09-24 NOTE — Telephone Encounter (Signed)
Pt would like protonix sent to [facility] in [place]. She would like 2 or 3 months please.

## 2013-09-24 NOTE — Telephone Encounter (Signed)
Per NP,we refer pt's to their pcp for any further refills of PPI's

## 2013-09-25 ENCOUNTER — Telehealth: Payer: Self-pay | Admitting: Nurse Practitioner

## 2013-09-25 NOTE — Telephone Encounter (Signed)
Patient originally requested this from Jory Sims, NP with [facility].  Patient was told to call her PCP to have this medication refilled but she stated the office was closed.  I told her that I will send a telephone note to the office so that you all will get it when you return.  She would like this called in to the [facility] in [place].

## 2013-09-28 MED ORDER — PANTOPRAZOLE SODIUM 40 MG PO TBEC: 40.0000 mg | DELAYED_RELEASE_TABLET | Freq: Every day | ORAL | Status: DC

## 2013-09-29 ENCOUNTER — Telehealth: Payer: Self-pay | Admitting: Nurse Practitioner

## 2013-10-07 ENCOUNTER — Telehealth: Payer: Self-pay | Admitting: Nurse Practitioner

## 2013-10-08 ENCOUNTER — Telehealth: Payer: Self-pay | Admitting: Nurse Practitioner

## 2013-10-08 MED ORDER — POLYSACCHARIDE IRON COMPLEX 150 MG PO CAPS: 150.0000 mg | ORAL_CAPSULE | Freq: Every day | ORAL | Status: DC

## 2013-10-08 MED ORDER — LISINOPRIL 5 MG PO TABS: 5.0000 mg | ORAL_TABLET | Freq: Every day | ORAL | Status: DC

## 2013-10-08 NOTE — Telephone Encounter (Signed)
done

## 2013-10-08 NOTE — Telephone Encounter (Signed)
rx sent to pharmacy

## 2013-10-10 ENCOUNTER — Other Ambulatory Visit: Payer: Self-pay | Admitting: Nurse Practitioner

## 2013-10-13 NOTE — Telephone Encounter (Signed)
Left refill on voicemail 

## 2013-10-13 NOTE — Telephone Encounter (Signed)
Please call in xanax with 1 refills 

## 2013-10-13 NOTE — Telephone Encounter (Signed)
Last seen 07/16/13, last filled 09/13/13. Call into [facility]

## 2013-10-16 ENCOUNTER — Ambulatory Visit: Payer: Medicare Other | Admitting: Nurse Practitioner

## 2013-10-21 ENCOUNTER — Encounter (INDEPENDENT_AMBULATORY_CARE_PROVIDER_SITE_OTHER): Payer: Self-pay

## 2013-10-21 ENCOUNTER — Ambulatory Visit (INDEPENDENT_AMBULATORY_CARE_PROVIDER_SITE_OTHER): Payer: Medicare Other | Admitting: Nurse Practitioner

## 2013-10-21 ENCOUNTER — Encounter: Payer: Self-pay | Admitting: Nurse Practitioner

## 2013-10-21 VITALS — BP 151/80 | HR 82 | Temp 97.4°F

## 2013-10-21 DIAGNOSIS — Z9861 Coronary angioplasty status: Secondary | ICD-10-CM

## 2013-10-21 DIAGNOSIS — G4700 Insomnia, unspecified: Secondary | ICD-10-CM

## 2013-10-21 DIAGNOSIS — R7309 Other abnormal glucose: Secondary | ICD-10-CM | POA: Diagnosis not present

## 2013-10-21 DIAGNOSIS — K219 Gastro-esophageal reflux disease without esophagitis: Secondary | ICD-10-CM

## 2013-10-21 DIAGNOSIS — E669 Obesity, unspecified: Secondary | ICD-10-CM

## 2013-10-21 DIAGNOSIS — I5033 Acute on chronic diastolic (congestive) heart failure: Secondary | ICD-10-CM

## 2013-10-21 DIAGNOSIS — I872 Venous insufficiency (chronic) (peripheral): Secondary | ICD-10-CM

## 2013-10-21 DIAGNOSIS — F419 Anxiety disorder, unspecified: Secondary | ICD-10-CM

## 2013-10-21 DIAGNOSIS — R739 Hyperglycemia, unspecified: Secondary | ICD-10-CM

## 2013-10-21 DIAGNOSIS — E876 Hypokalemia: Secondary | ICD-10-CM

## 2013-10-21 DIAGNOSIS — I10 Essential (primary) hypertension: Secondary | ICD-10-CM

## 2013-10-21 DIAGNOSIS — F332 Major depressive disorder, recurrent severe without psychotic features: Secondary | ICD-10-CM | POA: Diagnosis not present

## 2013-10-21 DIAGNOSIS — R609 Edema, unspecified: Secondary | ICD-10-CM | POA: Diagnosis not present

## 2013-10-21 DIAGNOSIS — I8310 Varicose veins of unspecified lower extremity with inflammation: Secondary | ICD-10-CM

## 2013-10-21 DIAGNOSIS — E785 Hyperlipidemia, unspecified: Secondary | ICD-10-CM | POA: Diagnosis not present

## 2013-10-21 DIAGNOSIS — I2510 Atherosclerotic heart disease of native coronary artery without angina pectoris: Secondary | ICD-10-CM

## 2013-10-21 DIAGNOSIS — I509 Heart failure, unspecified: Secondary | ICD-10-CM

## 2013-10-21 DIAGNOSIS — F411 Generalized anxiety disorder: Secondary | ICD-10-CM

## 2013-10-21 DIAGNOSIS — Z955 Presence of coronary angioplasty implant and graft: Secondary | ICD-10-CM

## 2013-10-21 DIAGNOSIS — R600 Localized edema: Secondary | ICD-10-CM

## 2013-10-21 MED ORDER — ATORVASTATIN CALCIUM 40 MG PO TABS: 40.0000 mg | ORAL_TABLET | Freq: Every day | ORAL | Status: DC

## 2013-10-21 MED ORDER — NITROGLYCERIN 0.4 MG SL SUBL: 0.4000 mg | SUBLINGUAL_TABLET | SUBLINGUAL | Status: DC | PRN

## 2013-10-21 MED ORDER — CEPHALEXIN 500 MG PO CAPS: 500.0000 mg | ORAL_CAPSULE | Freq: Four times a day (QID) | ORAL | Status: DC

## 2013-10-21 MED ORDER — DILTIAZEM HCL ER COATED BEADS 120 MG PO CP24: 120.0000 mg | ORAL_CAPSULE | Freq: Every day | ORAL | Status: DC

## 2013-10-21 MED ORDER — PANTOPRAZOLE SODIUM 40 MG PO TBEC: 40.0000 mg | DELAYED_RELEASE_TABLET | Freq: Every day | ORAL | Status: DC

## 2013-10-21 NOTE — Progress Notes (Signed)
Subjective:    Patient ID: Sharon Compton, female    DOB: 02/25/1941, 73 y.o.   MRN: [id number]  HPI Patient here today for follow up of chronic medical problems. She has multiple medical problems- She is doing well all except for the swelling in both feet which won't seem to resolve- Saw cardiologist 1 month ago and he took her off of lasix and put here on demadex and that has helped with her swelling tremendously. Patient Active Problem List   Diagnosis Date Noted  . Insomnia 07/16/2013  . Major depressive disorder, recurrent episode, severe, without mention of psychotic behavior 06/14/2013  . Posttraumatic stress disorder 06/14/2013  . Acute on chronic diastolic CHF (congestive heart failure), NYHA class 3 03/05/2013  . CAD (coronary artery disease), native coronary artery 03/05/2013  . Angina, class III 03/05/2013  . Hypokalemia 03/05/2013  . HTN (hypertension), benign 09/27/2012  . Hyperglycemia 10/03/2011  . Asthma 10/01/2011  . Campath-induced atrial fibrillation 10/01/2011  . 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 Diverticulosis 10/01/2011  . Peripheral edema 08/23/2011  . Elevated LFTs 08/22/2011  . Anxiety disorder 08/21/2011  . GI bleed 08/20/2011  . Acute blood loss anemia 08/20/2011  . Obesity 08/20/2011   Outpatient Encounter Prescriptions as of 10/21/2013  Medication Sig  . ALPRAZolam (XANAX) 1 MG tablet TAKE ONE TABLET BY MOUTH 4 TIMES DAILY AS NEEDED  . aspirin EC 81 MG tablet Take 1 tablet (81 mg total) by mouth daily.  [name] atorvastatin (LIPITOR) 40 MG tablet Take 1 tablet (40 mg total) by mouth daily at 6 PM.  . clopidogrel (PLAVIX) 75 MG tablet Take 1 tablet (75 mg total) by mouth daily with breakfast.  . diltiazem (CARDIZEM CD) 120 MG 24 hr capsule Take 1 capsule (120 mg total) by mouth daily.  [name] gabapentin (NEURONTIN) 400 MG capsule Take 1 capsule (400 mg total) by mouth 4 (four) times daily.  . iron polysaccharides (NIFEREX) 150 MG capsule  Take 1 capsule (150 mg total) by mouth daily.  [name] lisinopril (PRINIVIL,ZESTRIL) 5 MG tablet Take 1 tablet (5 mg total) by mouth daily.  . metoprolol tartrate (LOPRESSOR) 25 MG tablet Take 1 tablet (25 mg total) by mouth at bedtime.  . nitroGLYCERIN (NITROSTAT) 0.4 MG SL tablet Place 0.4 mg under the tongue every 5 (five) minutes as needed for chest pain.  . pantoprazole (PROTONIX) 40 MG tablet Take 1 tablet (40 mg total) by mouth daily.  . potassium chloride SA (K-DUR,KLOR-CON) 20 MEQ tablet Take 1 tablet (20 mEq total) by mouth daily.  [name] torsemide (DEMADEX) 20 MG tablet Take 1 tablet (20 mg total) by mouth daily.  . traZODone (DESYREL) 50 MG tablet 2 po qhs  . [DISCONTINUED] dicloxacillin (DYNAPEN) 250 MG capsule Take 1 capsule (250 mg total) by mouth every 6 (six) hours.  . [DISCONTINUED] gabapentin (NEURONTIN) 400 MG capsule TAKE ONE CAPSULE BY MOUTH 4 TIMES DAILY       Review of Systems  Constitutional: Negative.   HENT: Negative.   Respiratory: Positive for shortness of breath.   Cardiovascular: Positive for leg swelling.  Gastrointestinal: Negative.   Genitourinary: Negative.   Neurological: Negative.   Psychiatric/Behavioral: Negative.   All other systems reviewed and are negative.      Objective:   Physical Exam  Constitutional: She is oriented to person, place, and time. She appears well-developed and well-nourished.  HENT:  Nose: Nose normal.  Mouth/Throat: Oropharynx is clear and moist.  Eyes: EOM are normal.  Neck: Trachea normal, normal range of motion and full passive range of motion without pain. Neck supple. No JVD present. Carotid bruit is not present. No thyromegaly present.  Cardiovascular: Normal rate, regular rhythm, normal heart sounds and intact distal pulses.  Exam reveals no gallop and no friction rub.   No murmur heard. Pulmonary/Chest: Effort normal and breath sounds normal.  Abdominal: Soft. Bowel sounds are normal. She exhibits no distension and no  mass. There is no tenderness.  Musculoskeletal: Normal range of motion. She exhibits edema (3+ edema bil feet).  Lymphadenopathy:    She has no cervical adenopathy.  Neurological: She is alert and oriented to person, place, and time. She has normal reflexes.  Skin: Skin is warm and dry.  Psychiatric: She has a normal mood and affect. Her behavior is normal. Judgment and thought content normal.    BP 151/80  Pulse 82  Temp(Src) 97.4 F (36.3 C) (Oral)       Assessment & Plan:   1. Peripheral edema   2. Obesity   3. Major depressive disorder, recurrent episode, severe, without mention of psychotic behavior   4. Insomnia   5. Hypokalemia   6. Hyperglycemia   7. HTN (hypertension), benign   8. Anxiety disorder   9. Acute on chronic diastolic CHF (congestive heart failure), NYHA class 3   10. Chronic venous stasis dermatitis   11. Hx of right coronary artery stent placement   12. GERD (gastroesophageal reflux disease)   13. Hyperlipidemia LDL goal < 100    Orders Placed This Encounter  Procedures  . CMP14+EGFR  . NMR, lipoprofile   Meds ordered this encounter  Medications  . nitroGLYCERIN (NITROSTAT) 0.4 MG SL tablet    Sig: Place 1 tablet (0.4 mg total) under the tongue every 5 (five) minutes as needed for chest pain.    Dispense:  30 tablet    Refill:  0    Order Specific Question:  Supervising Provider    Answer:  Chipper Herb [[id number]]  . diltiazem (CARDIZEM CD) 120 MG 24 hr capsule    Sig: Take 1 capsule (120 mg total) by mouth daily.    Dispense:  90 capsule    Refill:  3    Order Specific Question:  Supervising Provider    Answer:  Lauree Chandler D [[id number]]  . pantoprazole (PROTONIX) 40 MG tablet    Sig: Take 1 tablet (40 mg total) by mouth daily.    Dispense:  90 tablet    Refill:  1    Let patient know when rx is ready for pick up    Order Specific Question:  Supervising Provider    Answer:  Lisette Grinder R [[id number]]  . cephALEXin (KEFLEX)  500 MG capsule    Sig: Take 1 capsule (500 mg total) by mouth 4 (four) times daily.    Dispense:  30 capsule    Refill:  0    Order Specific Question:  Supervising Provider    Answer:  Chipper Herb [[id number]]  . atorvastatin (LIPITOR) 40 MG tablet    Sig: Take 1 tablet (40 mg total) by mouth daily at 6 PM.    Dispense:  30 tablet    Refill:  11    Order Specific Question:  Supervising Provider    Answer:  Lauree Chandler D [[id number]]    Labs pending Health maintenance reviewed Diet and exercise encouraged Continue all meds Follow up  In 3 months Elevated legs when sitting Keep follow  up appointment with cardiologist    Mary-Margaret Hassell Done, FNP

## 2013-10-21 NOTE — Patient Instructions (Signed)
Venous Stasis or Chronic Venous Insufficiency Chronic venous insufficiency, also called venous stasis, is a condition that affects the veins in the legs. The condition prevents blood from being pumped through these veins effectively. Blood may no longer be pumped effectively from the legs back to the heart. This condition can range from mild to severe. With proper treatment, you should be able to continue with an active life. CAUSES  Chronic venous insufficiency occurs when the vein walls become stretched, weakened, or damaged or when valves within the vein are damaged. Some common causes of this include:  High blood pressure inside the veins (venous hypertension).  Increased blood pressure in the leg veins from long periods of sitting or standing.  A blood clot that blocks blood flow in a vein (deep vein thrombosis).  Inflammation of a superficial vein (phlebitis) that causes a blood clot to form. RISK FACTORS Various things can make you more likely to develop chronic venous insufficiency, including:  Family history of this condition.  Obesity.  Pregnancy.  Sedentary lifestyle.  Smoking.  Jobs requiring long periods of standing or sitting in one place.  Being a certain age. Women in their 44s and 91s and men in their 56s are more likely to develop this condition. SIGNS AND SYMPTOMS  Symptoms may include:   Varicose veins.  Skin breakdown or ulcers.  Reddened or discolored skin on the leg.  Brown, smooth, tight, and painful skin just above the ankle, usually on the inside surface (lipodermatosclerosis).  Swelling. DIAGNOSIS  To diagnose this condition, your health care provider will take a medical history and do a physical exam. The following tests may be ordered to confirm the diagnosis:  Duplex ultrasound A procedure that produces a picture of a blood vessel and nearby organs and also provides information on blood flow through the blood vessel.  Plethysmography A  procedure that tests blood flow.  A venogram, or venography A procedure used to look at the veins using X-ray and dye. TREATMENT The goals of treatment are to help you return to an active life and to minimize pain or disability. Treatment will depend on the severity of the condition. Medical procedures may be needed for severe cases. Treatment options may include:   Use of compression stockings. These can help with symptoms and lower the chances of the problem getting worse, but they do not cure the problem.  Sclerotherapy A procedure involving an injection of a material that "dissolves" the damaged veins. Other veins in the network of blood vessels take over the function of the damaged veins.  Surgery to remove the vein or cut off blood flow through the vein (vein stripping or laser ablation surgery).  Surgery to repair a valve. HOME CARE INSTRUCTIONS   Wear compression stockings as directed by your health care provider.  Only take over-the-counter or prescription medicines for pain, discomfort, or fever as directed by your health care provider.  Follow up with your health care provider as directed. SEEK MEDICAL CARE IF:   You have redness, swelling, or increasing pain in the affected area.  You see a red streak or line that extends up or down from the affected area.  You have a breakdown or loss of skin in the affected area, even if the breakdown is small.  You have an injury to the affected area. SEEK IMMEDIATE MEDICAL CARE IF:   You have an injury and open wound in the affected area.  Your pain is severe and does not improve with  medicine.  You have sudden numbness or weakness in the foot or ankle below the affected area, or you have trouble moving your foot or ankle.  You have a fever or persistent symptoms for more than 2 3 days.  You have a fever and your symptoms suddenly get worse. MAKE SURE YOU:   Understand these instructions.  Will watch your condition.  Will  get help right away if you are not doing well or get worse. Document Released: 10/15/2006 Document Revised: 04/01/2013 Document Reviewed: 02/16/2013 [facility] Patient Information 2014 [place].

## 2013-10-22 LAB — CMP14+EGFR
ALBUMIN: 4.1 g/dL (ref 3.5–4.8)
ALT: 18 IU/L (ref 0–32)
AST: 20 IU/L (ref 0–40)
Albumin/Globulin Ratio: 1.6 (ref 1.1–2.5)
Alkaline Phosphatase: 107 IU/L (ref 39–117)
BUN / CREAT RATIO: 18 (ref 11–26)
BUN: 18 mg/dL (ref 8–27)
CALCIUM: 9.4 mg/dL (ref 8.7–10.3)
CHLORIDE: 101 mmol/L (ref 97–108)
CO2: 26 mmol/L (ref 18–29)
Creatinine, Ser: 0.98 mg/dL (ref 0.57–1.00)
GFR calc Af Amer: 67 mL/min/{1.73_m2} (ref >59)
GFR calc non Af Amer: 58 mL/min/{1.73_m2} — ABNORMAL LOW (ref >59)
Globulin, Total: 2.5 g/dL (ref 1.5–4.5)
Glucose: 93 mg/dL (ref 65–99)
Potassium: 4.2 mmol/L (ref 3.5–5.2)
Sodium: 144 mmol/L (ref 134–144)
Total Bilirubin: 0.2 mg/dL (ref 0.0–1.2)
Total Protein: 6.6 g/dL (ref 6.0–8.5)

## 2013-10-22 LAB — NMR, LIPOPROFILE
CHOLESTEROL: 195 mg/dL (ref <200)
HDL Cholesterol by NMR: 40 mg/dL (ref >=40)
HDL Particle Number: 27 umol/L — ABNORMAL LOW (ref >=30.5)
LDL PARTICLE NUMBER: 1658 nmol/L — AB (ref <1000)
LDL Size: 20.2 nm (ref >20.5)
LDLC SERPL CALC-MCNC: 116 mg/dL — AB (ref <100)
LP-IR Score: 61 — ABNORMAL HIGH (ref <=45)
Small LDL Particle Number: 1062 nmol/L — ABNORMAL HIGH (ref <=527)
Triglycerides by NMR: 193 mg/dL — ABNORMAL HIGH (ref <150)

## 2013-10-23 ENCOUNTER — Telehealth: Payer: Self-pay | Admitting: Nurse Practitioner

## 2013-10-25 MED ORDER — GABAPENTIN 400 MG PO CAPS: 400.0000 mg | ORAL_CAPSULE | Freq: Four times a day (QID) | ORAL | Status: DC

## 2013-10-25 NOTE — Telephone Encounter (Signed)
abapentin was sent to pharmacy. Can get calcium OTC Really needs to be on a statin- willl she try another rstatin?

## 2013-10-26 NOTE — Telephone Encounter (Signed)
Patient aware will call back when wife wakes up

## 2013-10-27 ENCOUNTER — Telehealth: Payer: Self-pay | Admitting: Nurse Practitioner

## 2013-10-28 NOTE — Telephone Encounter (Signed)
Patient concerned because of increase in bruising over the past few months.  She is taking an aspirin a day. Explained that this thins the blood and could be the cause. She has been taking it for about 1 year.  What do you suggest?

## 2013-10-28 NOTE — Telephone Encounter (Signed)
Discussed Lipitor. She is ok with continuing this. She didn't realize she had been taking it for a while because she didn't know the generic name.

## 2013-11-03 ENCOUNTER — Emergency Department (HOSPITAL_COMMUNITY): Payer: Medicare Other

## 2013-11-03 ENCOUNTER — Encounter (HOSPITAL_COMMUNITY): Payer: Self-pay | Admitting: Emergency Medicine

## 2013-11-03 ENCOUNTER — Inpatient Hospital Stay (HOSPITAL_COMMUNITY)
Admission: EM | Admit: 2013-11-03 | Discharge: 2013-11-06 | DRG: 308 | Disposition: A | Discharge status: Home / Self Care | Payer: Medicare Other | Attending: Internal Medicine | Admitting: Internal Medicine

## 2013-11-03 ENCOUNTER — Telehealth: Payer: Self-pay | Admitting: Nurse Practitioner

## 2013-11-03 DIAGNOSIS — IMO0001 Reserved for inherently not codable concepts without codable children: Secondary | ICD-10-CM | POA: Diagnosis present

## 2013-11-03 DIAGNOSIS — J449 Chronic obstructive pulmonary disease, unspecified: Secondary | ICD-10-CM | POA: Diagnosis present

## 2013-11-03 DIAGNOSIS — Z833 Family history of diabetes mellitus: Secondary | ICD-10-CM

## 2013-11-03 DIAGNOSIS — R079 Chest pain, unspecified: Secondary | ICD-10-CM

## 2013-11-03 DIAGNOSIS — Z8249 Family history of ischemic heart disease and other diseases of the circulatory system: Secondary | ICD-10-CM

## 2013-11-03 DIAGNOSIS — Z7902 Long term (current) use of antithrombotics/antiplatelets: Secondary | ICD-10-CM

## 2013-11-03 DIAGNOSIS — Z87891 Personal history of nicotine dependence: Secondary | ICD-10-CM

## 2013-11-03 DIAGNOSIS — I509 Heart failure, unspecified: Secondary | ICD-10-CM

## 2013-11-03 DIAGNOSIS — K219 Gastro-esophageal reflux disease without esophagitis: Secondary | ICD-10-CM | POA: Diagnosis present

## 2013-11-03 DIAGNOSIS — I959 Hypotension, unspecified: Secondary | ICD-10-CM | POA: Diagnosis present

## 2013-11-03 DIAGNOSIS — F411 Generalized anxiety disorder: Secondary | ICD-10-CM | POA: Diagnosis present

## 2013-11-03 DIAGNOSIS — I252 Old myocardial infarction: Secondary | ICD-10-CM

## 2013-11-03 DIAGNOSIS — J4489 Other specified chronic obstructive pulmonary disease: Secondary | ICD-10-CM | POA: Diagnosis present

## 2013-11-03 DIAGNOSIS — R600 Localized edema: Secondary | ICD-10-CM | POA: Diagnosis present

## 2013-11-03 DIAGNOSIS — Z8701 Personal history of pneumonia (recurrent): Secondary | ICD-10-CM

## 2013-11-03 DIAGNOSIS — F419 Anxiety disorder, unspecified: Secondary | ICD-10-CM | POA: Diagnosis present

## 2013-11-03 DIAGNOSIS — I5033 Acute on chronic diastolic (congestive) heart failure: Secondary | ICD-10-CM | POA: Diagnosis present

## 2013-11-03 DIAGNOSIS — R000 Tachycardia, unspecified: Secondary | ICD-10-CM | POA: Diagnosis not present

## 2013-11-03 DIAGNOSIS — I2510 Atherosclerotic heart disease of native coronary artery without angina pectoris: Secondary | ICD-10-CM | POA: Diagnosis present

## 2013-11-03 DIAGNOSIS — Z7982 Long term (current) use of aspirin: Secondary | ICD-10-CM | POA: Diagnosis not present

## 2013-11-03 DIAGNOSIS — E785 Hyperlipidemia, unspecified: Secondary | ICD-10-CM | POA: Diagnosis present

## 2013-11-03 DIAGNOSIS — Z79899 Other long term (current) drug therapy: Secondary | ICD-10-CM | POA: Diagnosis not present

## 2013-11-03 DIAGNOSIS — I10 Essential (primary) hypertension: Secondary | ICD-10-CM | POA: Diagnosis present

## 2013-11-03 DIAGNOSIS — R002 Palpitations: Secondary | ICD-10-CM | POA: Diagnosis not present

## 2013-11-03 DIAGNOSIS — I4891 Unspecified atrial fibrillation: Secondary | ICD-10-CM | POA: Diagnosis not present

## 2013-11-03 DIAGNOSIS — R609 Edema, unspecified: Secondary | ICD-10-CM | POA: Diagnosis present

## 2013-11-03 HISTORY — DX: Atherosclerotic heart disease of native coronary artery without angina pectoris: I25.10

## 2013-11-03 HISTORY — DX: Lymphedema, not elsewhere classified: I89.0

## 2013-11-03 LAB — BASIC METABOLIC PANEL
BUN: 15 mg/dL (ref 6–23)
CHLORIDE: 103 meq/L (ref 96–112)
CO2: 27 meq/L (ref 19–32)
CREATININE: 1 mg/dL (ref 0.50–1.10)
Calcium: 9.3 mg/dL (ref 8.4–10.5)
GFR calc Af Amer: 64 mL/min — ABNORMAL LOW (ref >90)
GFR calc non Af Amer: 55 mL/min — ABNORMAL LOW (ref >90)
Glucose, Bld: 141 mg/dL — ABNORMAL HIGH (ref 70–99)
POTASSIUM: 4.3 meq/L (ref 3.7–5.3)
Sodium: 141 mEq/L (ref 137–147)

## 2013-11-03 LAB — LIPID PANEL
CHOL/HDL RATIO: 4.7 ratio
CHOLESTEROL: 201 mg/dL — AB (ref 0–200)
HDL: 43 mg/dL (ref >39)
LDL Cholesterol: 122 mg/dL — ABNORMAL HIGH (ref 0–99)
Triglycerides: 180 mg/dL — ABNORMAL HIGH (ref <150)
VLDL: 36 mg/dL (ref 0–40)

## 2013-11-03 LAB — CBC WITH DIFFERENTIAL/PLATELET
Basophils Absolute: 0 K/uL (ref 0.0–0.1)
Basophils Relative: 1 % (ref 0–1)
Eosinophils Absolute: 0.1 K/uL (ref 0.0–0.7)
Eosinophils Relative: 2 % (ref 0–5)
HCT: 42.1 % (ref 36.0–46.0)
Hemoglobin: 13.6 g/dL (ref 12.0–15.0)
Lymphocytes Relative: 39 % (ref 12–46)
Lymphs Abs: 2.3 K/uL (ref 0.7–4.0)
MCH: 26.8 pg (ref 26.0–34.0)
MCHC: 32.3 g/dL (ref 30.0–36.0)
MCV: 83 fL (ref 78.0–100.0)
Monocytes Absolute: 0.4 K/uL (ref 0.1–1.0)
Monocytes Relative: 7 % (ref 3–12)
Neutro Abs: 3.1 K/uL (ref 1.7–7.7)
Neutrophils Relative %: 51 % (ref 43–77)
Platelets: 190 K/uL (ref 150–400)
RBC: 5.07 MIL/uL (ref 3.87–5.11)
RDW: 16.2 % — ABNORMAL HIGH (ref 11.5–15.5)
WBC: 5.9 K/uL (ref 4.0–10.5)

## 2013-11-03 LAB — MRSA PCR SCREENING: MRSA by PCR: NEGATIVE

## 2013-11-03 LAB — TROPONIN I
Troponin I: <0.3 ng/mL
Troponin I: <0.3 ng/mL (ref <0.30)

## 2013-11-03 MED ORDER — ASPIRIN 81 MG PO CHEW
324.0000 mg | CHEWABLE_TABLET | Freq: Once | ORAL | Status: DC
  Filled 2013-11-03: qty 4

## 2013-11-03 MED ORDER — DILTIAZEM HCL 100 MG IV SOLR
5.0000 mg/h | INTRAVENOUS | Status: DC
  Administered 2013-11-03: 10 mg/h via INTRAVENOUS

## 2013-11-03 MED ORDER — POLYSACCHARIDE IRON COMPLEX 150 MG PO CAPS
150.0000 mg | ORAL_CAPSULE | Freq: Every day | ORAL | Status: DC
  Administered 2013-11-04 – 2013-11-06 (×3): 150 mg via ORAL
  Filled 2013-11-03 (×4): qty 1

## 2013-11-03 MED ORDER — SODIUM CHLORIDE 0.9 % IV SOLN: 250.0000 mL | INTRAVENOUS | Status: DC | PRN

## 2013-11-03 MED ORDER — DILTIAZEM HCL 100 MG IV SOLR
5.0000 mg/h | Freq: Once | INTRAVENOUS | Status: AC
  Administered 2013-11-03: 10 mg/h via INTRAVENOUS
  Filled 2013-11-03: qty 100

## 2013-11-03 MED ORDER — ONDANSETRON HCL 4 MG PO TABS: 4.0000 mg | ORAL_TABLET | Freq: Four times a day (QID) | ORAL | Status: DC | PRN

## 2013-11-03 MED ORDER — ALUM & MAG HYDROXIDE-SIMETH 200-200-20 MG/5ML PO SUSP: 30.0000 mL | Freq: Four times a day (QID) | ORAL | Status: DC | PRN

## 2013-11-03 MED ORDER — ONDANSETRON HCL 4 MG/2ML IJ SOLN
4.0000 mg | Freq: Four times a day (QID) | INTRAMUSCULAR | Status: DC | PRN
  Administered 2013-11-04: 4 mg via INTRAVENOUS
  Filled 2013-11-03: qty 2

## 2013-11-03 MED ORDER — BISACODYL 10 MG RE SUPP: 10.0000 mg | Freq: Every day | RECTAL | Status: DC | PRN

## 2013-11-03 MED ORDER — GABAPENTIN 400 MG PO CAPS
400.0000 mg | ORAL_CAPSULE | Freq: Four times a day (QID) | ORAL | Status: DC
  Administered 2013-11-03 – 2013-11-06 (×12): 400 mg via ORAL
  Filled 2013-11-03 (×12): qty 1

## 2013-11-03 MED ORDER — ONDANSETRON 4 MG PO TBDP
4.0000 mg | ORAL_TABLET | Freq: Once | ORAL | Status: AC
  Administered 2013-11-03: 4 mg via ORAL
  Filled 2013-11-03: qty 1

## 2013-11-03 MED ORDER — SODIUM CHLORIDE 0.9 % IJ SOLN
3.0000 mL | Freq: Two times a day (BID) | INTRAMUSCULAR | Status: DC
  Administered 2013-11-04 – 2013-11-05 (×3): 3 mL via INTRAVENOUS

## 2013-11-03 MED ORDER — SODIUM CHLORIDE 0.9 % IJ SOLN
3.0000 mL | INTRAMUSCULAR | Status: DC | PRN
  Administered 2013-11-04 (×3): 3 mL via INTRAVENOUS

## 2013-11-03 MED ORDER — TORSEMIDE 20 MG PO TABS
20.0000 mg | ORAL_TABLET | Freq: Every day | ORAL | Status: DC
  Administered 2013-11-03: 20 mg via ORAL
  Filled 2013-11-03: qty 1

## 2013-11-03 MED ORDER — PANTOPRAZOLE SODIUM 40 MG PO TBEC
40.0000 mg | DELAYED_RELEASE_TABLET | Freq: Every day | ORAL | Status: DC
  Administered 2013-11-03 – 2013-11-06 (×4): 40 mg via ORAL
  Filled 2013-11-03 (×4): qty 1

## 2013-11-03 MED ORDER — ALPRAZOLAM 0.5 MG PO TABS
1.0000 mg | ORAL_TABLET | Freq: Two times a day (BID) | ORAL | Status: DC | PRN
  Administered 2013-11-03 – 2013-11-04 (×2): 1 mg via ORAL
  Filled 2013-11-03 (×2): qty 2

## 2013-11-03 MED ORDER — ACETAMINOPHEN 325 MG PO TABS
650.0000 mg | ORAL_TABLET | Freq: Four times a day (QID) | ORAL | Status: DC | PRN
  Administered 2013-11-04 – 2013-11-06 (×3): 650 mg via ORAL
  Filled 2013-11-03 (×3): qty 2

## 2013-11-03 MED ORDER — POTASSIUM CHLORIDE CRYS ER 20 MEQ PO TBCR
20.0000 meq | EXTENDED_RELEASE_TABLET | Freq: Every day | ORAL | Status: DC
  Administered 2013-11-03 – 2013-11-04 (×2): 20 meq via ORAL
  Filled 2013-11-03 (×2): qty 1

## 2013-11-03 MED ORDER — ATORVASTATIN CALCIUM 40 MG PO TABS
40.0000 mg | ORAL_TABLET | Freq: Every day | ORAL | Status: DC
  Administered 2013-11-03: 40 mg via ORAL
  Filled 2013-11-03: qty 1

## 2013-11-03 MED ORDER — ENOXAPARIN SODIUM 40 MG/0.4ML ~~LOC~~ SOLN
40.0000 mg | SUBCUTANEOUS | Status: DC
  Administered 2013-11-03 – 2013-11-05 (×3): 40 mg via SUBCUTANEOUS
  Filled 2013-11-03 (×3): qty 0.4

## 2013-11-03 MED ORDER — LISINOPRIL 5 MG PO TABS
5.0000 mg | ORAL_TABLET | Freq: Every day | ORAL | Status: DC
  Filled 2013-11-03: qty 1

## 2013-11-03 MED ORDER — MORPHINE SULFATE 2 MG/ML IJ SOLN: 1.0000 mg | INTRAMUSCULAR | Status: DC | PRN

## 2013-11-03 MED ORDER — ACETAMINOPHEN 650 MG RE SUPP: 650.0000 mg | Freq: Four times a day (QID) | RECTAL | Status: DC | PRN

## 2013-11-03 MED ORDER — TRAZODONE HCL 50 MG PO TABS
100.0000 mg | ORAL_TABLET | Freq: Every evening | ORAL | Status: DC | PRN
  Administered 2013-11-03 – 2013-11-05 (×3): 100 mg via ORAL
  Filled 2013-11-03 (×3): qty 2

## 2013-11-03 NOTE — Progress Notes (Signed)
UR chart review completed.  
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
## 2013-11-03 NOTE — ED Provider Notes (Addendum)
CSN: [id number]     Arrival date & time 11/03/13  1008 History  This chart was scribed for Merryl Hacker, MD by Eston Mould, ED Scribe. This patient was seen in room APA07/APA07 and the patient's care was started at 10:23 AM.  Chief Complaint  Patient presents with  . Chest Pain   The history is provided by the patient. No language interpreter was used.   HPI Comments: Sharon Compton is a 73 y.o. female with hx of A-fib and stent placed 6 months ago who presents to the Emergency Department complaining of CP, palpitations, and weakness that began 4 hours ago. Reports having SOB that began 4 hours ago (6 am) and states "she is having a hard time catching her breath". Pt states she went to bed feeling well but states she woke up "feeling weird and unable to fully describe". Cardiologist is in [place]. Pt states she was admitted to ICU for 6 days and reports needing to be shocked. Pt states she was in A-fib while in [place], [place]. States she has taken 4 asprins this mroning. Pt takes Metoprolol and diltiazem. States she has a hx of GERD and edema to lower extremities. States her current edema is the same compared to her baseline. Was taking Lasix for her edema but meds were changed to [place].  I have reviewed the patient's chart. Patient had stents placed in September of 2014. She has followed up with an NP in Dr. Court Joy office.   Past Medical History  Diagnosis Date  . Hypertension   . Anxiety and depression   . Asthma   . Fibromyalgia   . History of pneumonia   . Peripheral edema   . Peptic ulcer disease   . Hiatal hernia   . GERD (gastroesophageal reflux disease)   . A-fib     cardioversion and TEE at [facility]; pt reports DCCV 2014 at [facility] as well.  . 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 Diverticular disease 10/01/2011  . MI, acute, non ST segment elevation 10/02/2011  . GI bleed     ?recurrent--therefore, no anticoagulant  . COPD (chronic  obstructive pulmonary disease)    Past Surgical History  Procedure Laterality Date  . Abdominal hysterectomy  age 74    nonmalignant reason  . Cholecystectomy    . Abdominal exploration surgery    . Abd tumor removed      states was 10 lbs, benign  . Knee surgery    . Bladder stent    . Esophagogastroduodenoscopy  08/24/2011    Procedure: ESOPHAGOGASTRODUODENOSCOPY (EGD);  Surgeon: Daneil Dolin, MD;  [facility];  Service: Endoscopy;  Laterality: N/A;  give phenergan 12.5mg  iv 30 mins prior to procedure  . Colonoscopy  08/25/2011    Procedure: COLONOSCOPY;  Surgeon: Daneil Dolin, MD;  [facility];  Service: Endoscopy;  Laterality: N/A;  . Colonoscopy  10/03/2011    Procedure: COLONOSCOPY;  Surgeon: Daneil Dolin, MD;  [facility];  Service: Endoscopy;  Laterality: N/A;  NEEDS PHENERGAN 25 MG IV ON CALL  . Colonoscopy  10/04/2011    Procedure: COLONOSCOPY;  Surgeon: Daneil Dolin, MD;  [facility];  Service: Endoscopy;  Laterality: N/A;  Phenergan 12.5 mg ON CALL  . Transthoracic echocardiogram  09/2011    EF 60-65%, septal hypokinesia  . Cardiovascular stress test  09/2011    equivocal result, most likely low risk; pt refused the recommended cardiac cath to  follow this up.   Family History  Problem Relation Age of Onset  . Diabetes Mother     Deceased  . Hypertension Mother   . Cancer Father     Deceased  . Colon cancer Neg Hx   . Coronary artery disease Mother   . Heart failure Mother    History  Substance Use Topics  . Smoking status: Former Smoker -- 1.00 packs/day for 15 years    Types: Cigarettes    Quit date: 06/26/1995  . Smokeless tobacco: Never Used  . Alcohol Use: No   OB History   Grav Para Term Preterm Abortions TAB SAB Ect Mult Living   3 3 3       3      Review of Systems  Constitutional: Negative for fever.  Respiratory: Positive for shortness of breath. Negative for cough and chest tightness.    Cardiovascular: Positive for chest pain, palpitations and leg swelling.  Gastrointestinal: Negative for nausea, vomiting and abdominal pain.  Genitourinary: Negative for dysuria.  Musculoskeletal: Negative for back pain.  Skin: Negative for wound.  Neurological: Negative for light-headedness and headaches.  Psychiatric/Behavioral: Negative for confusion.  All other systems reviewed and are negative.   Allergies  Dye fdc red; Sulfa antibiotics; and Codeine  Home Medications   Prior to Admission medications   Medication Sig Start Date End Date Taking? Authorizing Provider  ALPRAZolam (XANAX) 1 MG tablet TAKE ONE TABLET BY MOUTH 4 TIMES DAILY AS NEEDED    Mary-Margaret Hassell Done, FNP  aspirin EC 81 MG tablet Take 1 tablet (81 mg total) by mouth daily. 06/16/13   Waylan Boga, NP  atorvastatin (LIPITOR) 40 MG tablet Take 1 tablet (40 mg total) by mouth daily at 6 PM. 10/21/13   Mary-Margaret Hassell Done, FNP  cephALEXin (KEFLEX) 500 MG capsule Take 1 capsule (500 mg total) by mouth 4 (four) times daily. 10/21/13   Mary-Margaret Hassell Done, FNP  clopidogrel (PLAVIX) 75 MG tablet Take 1 tablet (75 mg total) by mouth daily with breakfast. 06/16/13   Waylan Boga, NP  diltiazem (CARDIZEM CD) 120 MG 24 hr capsule Take 1 capsule (120 mg total) by mouth daily. 10/21/13   Mary-Margaret Hassell Done, FNP  gabapentin (NEURONTIN) 400 MG capsule Take 1 capsule (400 mg total) by mouth 4 (four) times daily. 10/25/13   Mary-Margaret Hassell Done, FNP  iron polysaccharides (NIFEREX) 150 MG capsule Take 1 capsule (150 mg total) by mouth daily. 10/08/13   Mary-Margaret Hassell Done, FNP  lisinopril (PRINIVIL,ZESTRIL) 5 MG tablet Take 1 tablet (5 mg total) by mouth daily. 10/08/13   Mary-Margaret Hassell Done, FNP  metoprolol tartrate (LOPRESSOR) 25 MG tablet Take 1 tablet (25 mg total) by mouth at bedtime. 09/10/13   Lendon Colonel, NP  nitroGLYCERIN (NITROSTAT) 0.4 MG SL tablet Place 1 tablet (0.4 mg total) under the tongue every 5 (five) minutes  as needed for chest pain. 10/21/13   Mary-Margaret Hassell Done, FNP  pantoprazole (PROTONIX) 40 MG tablet Take 1 tablet (40 mg total) by mouth daily. 10/21/13   Mary-Margaret Hassell Done, FNP  potassium chloride SA (K-DUR,KLOR-CON) 20 MEQ tablet Take 1 tablet (20 mEq total) by mouth daily. 06/16/13   Waylan Boga, NP  torsemide (DEMADEX) 20 MG tablet Take 1 tablet (20 mg total) by mouth daily. 09/10/13   Lendon Colonel, NP  traZODone (DESYREL) 50 MG tablet 2 po qhs 07/16/13   Mary-Margaret Martin, FNP   BP 106/49  Pulse 112  Temp(Src) 98.1 F (36.7 C) (Oral)  Resp 20  Ht 5' (  1.524 m)  Wt 200 lb (90.719 kg)  BMI 39.06 kg/m2  SpO2 94%  Physical Exam  Nursing note and vitals reviewed. Constitutional: She is oriented to person, place, and time. No distress.  Overweight  HENT:  Head: Normocephalic and atraumatic.  Mouth/Throat: Oropharynx is clear and moist.  Cardiovascular: Normal heart sounds.   Tachycardia, irregular rhythm  Pulmonary/Chest: Effort normal and breath sounds normal. No respiratory distress. She has no wheezes.  Abdominal: Soft. Bowel sounds are normal. There is no tenderness. There is no rebound.  Musculoskeletal: She exhibits edema.  3+ symmetric BLE edema  Neurological: She is alert and oriented to person, place, and time.  Skin: Skin is warm and dry.  Psychiatric: She has a normal mood and affect.    ED Course  Procedures   CRITICAL CARE Performed by: Merryl Hacker   Total critical care time: 35 min  Critical care time was exclusive of separately billable procedures and treating other patients.  Critical care was necessary to treat or prevent imminent or life-threatening deterioration.  Critical care was time spent personally by me on the following activities: development of treatment plan with patient and/or surrogate as well as nursing, discussions with consultants, evaluation of patient's response to treatment, examination of patient, obtaining history from  patient or surrogate, ordering and performing treatments and interventions, ordering and review of laboratory studies, ordering and review of radiographic studies, pulse oximetry and re-evaluation of patient's condition.  DIAGNOSTIC STUDIES: Oxygen Saturation is 98% on RA, nomral by my interpretation.    COORDINATION OF CARE: 10:27 AM-Discussed treatment plan which includes labs and x-rays. Pt agreed to plan.   11:54 AM-Consulted with on-call cardiologist.  Labs Review Labs Reviewed  CBC WITH DIFFERENTIAL - Abnormal; Notable for the following:    RDW 16.2 (*)    All other components within normal limits  BASIC METABOLIC PANEL - Abnormal; Notable for the following:    Glucose, Bld 141 (*)    GFR calc non Af Amer 55 (*)    GFR calc Af Amer 64 (*)    All other components within normal limits  TROPONIN I   Imaging Review Dg Chest Portable 1 View  11/03/2013   CLINICAL DATA:  Chest pain and tachycardia  EXAM: PORTABLE CHEST - 1 VIEW  COMPARISON:  September 10, 2013  FINDINGS: Lungs are clear. Heart size and pulmonary vascularity are normal. No adenopathy. No bone lesions. No pneumothorax.  IMPRESSION: No edema or consolidation.   Electronically Signed   By: Lowella Grip M.D.   On: 11/03/2013 10:35     EKG Interpretation   Date/Time:  Tuesday Nov 03 2013 10:11:58 EDT Ventricular Rate:  140 PR Interval:    QRS Duration: 85 QT Interval:  322 QTC Calculation: 491 R Axis:   81 Text Interpretation:  Atrial fibrillation Borderline right axis deviation  Repolarization abnormality, prob rate related Borderline prolonged QT  interval Confirmed by Lucie Friedlander  MD, [place] ([id number]) on 11/03/2013 10:15:55  AM      MDM   Final diagnoses:  Atrial fibrillation with RVR    Patient presents with shortness of breath, chest pain, and palpitations. Noted to be in atrial fibrillation with a rate in the 150s. She is otherwise nontoxic.  Patient bolus diltiazem and started on a diltiazem drip.  Current rate on 10 of diltiazem is in the 90s.  Patient reports improvement of symptoms. Chest x-ray without evidence of edema. Basic labwork obtained and reassuring including troponin. Discussed with Dr. Harl Bowie  who is on-call for cardiology.  Will admit patient to medicine for further management.  I personally performed the services described in this documentation, which was scribed in my presence. The recorded information has been reviewed and is accurate.    Merryl Hacker, MD 11/03/13 1856  Merryl Hacker, MD 11/03/13 1235

## 2013-11-03 NOTE — H&P (Signed)
Patient seen and examined. Above note reviewed.  Patient is admitted with shortness of breath she was noted to be severely tachycardic. She was found to be in rapid atrial fibrillation. She does have a chronic history of atrial fib. She's been started on Cardizem infusion. We'll continue this overnight and plan on transitioning to oral Cardizem tomorrow. She's not a candidate for anticoagulation due to GI bleed. She does have chronic lower extremity edema, which likely has a large component of lymphedema. She would likely benefit from referral to lymphedema clinic at [facility] discharge home the next 24-48 hours  [facility]

## 2013-11-03 NOTE — ED Notes (Signed)
Chest pain since 6 am.  Worse with palpation.  States she feels like she can't catch her breath.  Rate 150's a fib with ems.  ems gave one ntg.  Pt took 5 baby aspirin at home.

## 2013-11-03 NOTE — H&P (Signed)
[facility] History and Physical  Sharon Compton XBM:[id number] DOB: 1941-05-29 DOA: 11/03/2013  Referring physician:  PCP: Redge Gainer, MD   Chief Complaint: Chest pain/shortness of breath/palpitations  HPI: Sharon Compton is a 73 y.o. female past medical history that includes A. fib and CAD status post stent placement 6 months ago presents to the emergency department with the chief complaint of chest pain, palpitation and shortness of breath. Initial evaluation in the emergency department includes an EKG revealing atrial fibrillation with rapid ventricular response at a rate of 144. She was started on a Cardizem drip in the emergency department.  Patient reports she was awakened at about 3 AM this morning with left chest pain and palpitations. She describes the pain as sharp located just under her left breast. Associated symptoms include shortness of breath, diaphoresis, nausea with no vomiting. She also reports chronic lower extremity edema that is slightly worse. She denies abdominal pain fever chills dysuria hematuria frequency or urgency. She denies constipation diarrhea melena or hematochezia.   Workup in the emergency department includes basic metabolic panel that is unremarkable, a complete blood count is unremarkable except for a serum glucose of 141. Initial troponin is negative. Chest x-ray without any acute cardiopulmonary disease. She is hemodynamically stable afebrile and not hypoxic.    Review of Systems:  10 point review of systems completed and all systems are negative except as indicated in the history of present illness     Past Medical History  Diagnosis Date  . Hypertension   . Anxiety and depression   . Asthma   . Fibromyalgia   . History of pneumonia   . Peripheral edema   . Peptic ulcer disease   . Hiatal hernia   . GERD (gastroesophageal reflux disease)   . A-fib     cardioversion and TEE at [facility]; pt reports DCCV 2014 at [facility] as well.  . AVM  (arteriovenous malformation) of colon 10/01/2011  . Diverticular disease 10/01/2011  . MI, acute, non ST segment elevation 10/02/2011  . GI bleed     ?recurrent--therefore, no anticoagulant  . COPD (chronic obstructive pulmonary disease)    Past Surgical History  Procedure Laterality Date  . Abdominal hysterectomy  age 50    nonmalignant reason  . Cholecystectomy    . Abdominal exploration surgery    . Abd tumor removed      states was 10 lbs, benign  . Knee surgery    . Bladder stent    . Esophagogastroduodenoscopy  08/24/2011    Procedure: ESOPHAGOGASTRODUODENOSCOPY (EGD);  Surgeon: Daneil Dolin, MD;  [facility];  Service: Endoscopy;  Laterality: N/A;  give phenergan 12.5mg  iv 30 mins prior to procedure  . Colonoscopy  08/25/2011    Procedure: COLONOSCOPY;  Surgeon: Daneil Dolin, MD;  Location: [facility];  Service: Endoscopy;  Laterality: N/A;  . Colonoscopy  10/03/2011    Procedure: COLONOSCOPY;  Surgeon: Daneil Dolin, MD;  Location: [facility];  Service: Endoscopy;  Laterality: N/A;  NEEDS PHENERGAN 25 MG IV ON CALL  . Colonoscopy  10/04/2011    Procedure: COLONOSCOPY;  Surgeon: Daneil Dolin, MD;  Location: [facility];  Service: Endoscopy;  Laterality: N/A;  Phenergan 12.5 mg ON CALL  . Transthoracic echocardiogram  09/2011    EF 60-65%, septal hypokinesia  . Cardiovascular stress test  09/2011    equivocal result, most likely low risk; pt refused the recommended cardiac cath to follow this up.  Social History:  reports that she quit smoking about 18 years ago. Her smoking use included Cigarettes. She has a 15 pack-year smoking history. She has never used smokeless tobacco. She reports that she does not drink alcohol or use illicit drugs. She is a retired Armed forces logistics/support/administrative officer. She is married and lives at home with her husband who is a heavy smoker. She is independent with her ADLs Allergies  Allergen Reactions  . Dye Fdc Red [Red Dye]   . Sulfa Antibiotics Itching  .  Codeine Itching and Palpitations    Family History  Problem Relation Age of Onset  . Diabetes Mother     Deceased  . Hypertension Mother   . Cancer Father     Deceased  . Colon cancer Neg Hx   . Coronary artery disease Mother   . Heart failure Mother      Prior to Admission medications   Medication Sig Start Date End Date Taking? Authorizing Provider  ALPRAZolam (XANAX) 1 MG tablet TAKE ONE TABLET BY MOUTH 4 TIMES DAILY AS NEEDED    Mary-Margaret Hassell Done, FNP  aspirin EC 81 MG tablet Take 1 tablet (81 mg total) by mouth daily. 06/16/13   Waylan Boga, NP  atorvastatin (LIPITOR) 40 MG tablet Take 1 tablet (40 mg total) by mouth daily at 6 PM. 10/21/13   Mary-Margaret Hassell Done, FNP  cephALEXin (KEFLEX) 500 MG capsule Take 1 capsule (500 mg total) by mouth 4 (four) times daily. 10/21/13   Mary-Margaret Hassell Done, FNP  clopidogrel (PLAVIX) 75 MG tablet Take 1 tablet (75 mg total) by mouth daily with breakfast. 06/16/13   Waylan Boga, NP  diltiazem (CARDIZEM CD) 120 MG 24 hr capsule Take 1 capsule (120 mg total) by mouth daily. 10/21/13   Mary-Margaret Hassell Done, FNP  gabapentin (NEURONTIN) 400 MG capsule Take 1 capsule (400 mg total) by mouth 4 (four) times daily. 10/25/13   Mary-Margaret Hassell Done, FNP  iron polysaccharides (NIFEREX) 150 MG capsule Take 1 capsule (150 mg total) by mouth daily. 10/08/13   Mary-Margaret Hassell Done, FNP  lisinopril (PRINIVIL,ZESTRIL) 5 MG tablet Take 1 tablet (5 mg total) by mouth daily. 10/08/13   Mary-Margaret Hassell Done, FNP  metoprolol tartrate (LOPRESSOR) 25 MG tablet Take 1 tablet (25 mg total) by mouth at bedtime. 09/10/13   Lendon Colonel, NP  nitroGLYCERIN (NITROSTAT) 0.4 MG SL tablet Place 1 tablet (0.4 mg total) under the tongue every 5 (five) minutes as needed for chest pain. 10/21/13   Mary-Margaret Hassell Done, FNP  pantoprazole (PROTONIX) 40 MG tablet Take 1 tablet (40 mg total) by mouth daily. 10/21/13   Mary-Margaret Hassell Done, FNP  potassium chloride SA (K-DUR,KLOR-CON) 20  MEQ tablet Take 1 tablet (20 mEq total) by mouth daily. 06/16/13   Waylan Boga, NP  torsemide (DEMADEX) 20 MG tablet Take 1 tablet (20 mg total) by mouth daily. 09/10/13   Lendon Colonel, NP  traZODone (DESYREL) 50 MG tablet 2 po qhs 07/16/13   Mary-Margaret Hassell Done, FNP   Physical Exam: Filed Vitals:   11/03/13 1250  BP: 154/94  Pulse: 84  Temp: 97.7 F (36.5 C)  Resp: 16    BP 154/94  Pulse 84  Temp(Src) 97.7 F (36.5 C) (Oral)  Resp 16  Ht 5' (1.524 m)  Wt 90.719 kg (200 lb)  BMI 39.06 kg/m2  SpO2 96%  General:  Appears calm and comfortable Eyes: PERRL, normal lids, irises & conjunctiva ENT: Ears are clear nose without drainage oropharynx without erythema or exudate. Mucous membranes of her mouth  are pink and moist Neck: no LAD, masses or thyromegaly Cardiovascular: Irregularly irregular and tachycardia, I hear no murmur no gallop no rub. Bilateral lower extremities with erythema and 3+ edema from knee to foot. Venous stasis skin changes Telemetry: SR, no arrhythmias  Respiratory: Normal effort breath sounds are clear bilaterally no wheeze no crackles Abdomen: Obese soft positive bowel sounds throughout nontender to palpation no mass organomegaly noted Skin: Some venous stasis changes in bilateral lower extremities. Some bruising on her upper arms Musculoskeletal: Joints without swelling/erythema. Moves all extremities. Fair muscle tone Psychiatric: grossly normal mood and affect, speech fluent and appropriate Neurologic: grossly non-focal. Speech is clear facial symmetry cranial nerves II through XII grossly intact           Labs on Admission:  Basic Metabolic Panel:  Recent Labs Lab 11/03/13 1026  NA 141  K 4.3  CL 103  CO2 27  GLUCOSE 141*  BUN 15  CREATININE 1.00  CALCIUM 9.3   Liver Function Tests: No results found for this basename: AST, ALT, ALKPHOS, BILITOT, PROT, ALBUMIN,  in the last 168 hours No results found for this basename: LIPASE, AMYLASE,   in the last 168 hours No results found for this basename: AMMONIA,  in the last 168 hours CBC:  Recent Labs Lab 11/03/13 1026  WBC 5.9  NEUTROABS 3.1  HGB 13.6  HCT 42.1  MCV 83.0  PLT 190   Cardiac Enzymes:  Recent Labs Lab 11/03/13 1026  TROPONINI <0.30    BNP (last 3 results) No results found for this basename: PROBNP,  in the last 8760 hours CBG: No results found for this basename: GLUCAP,  in the last 168 hours  Radiological Exams on Admission: Dg Chest Portable 1 View  11/03/2013   CLINICAL DATA:  Chest pain and tachycardia  EXAM: PORTABLE CHEST - 1 VIEW  COMPARISON:  September 10, 2013  FINDINGS: Lungs are clear. Heart size and pulmonary vascularity are normal. No adenopathy. No bone lesions. No pneumothorax.  IMPRESSION: No edema or consolidation.   Electronically Signed   By: Lowella Grip M.D.   On: 11/03/2013 10:35    EKG: Independently reviewed and were fibrillation  Assessment/Plan Principal Problem:   Atrial fibrillation with RVR: Trigger unclear. History of same, not on anticoagulation due to GI bleed. Admit to step down. Will continue Cardizem drip that was initiated in the emergency department. At the time of my exam her rate range 98-110. I medication home medications include Cardizem 120 mg daily and Lopressor 25 mg daily at bedtime. Patient is a very poor historian regarding her medications and what she takes for now once his been taking it. Chart review indicates at one time she was on anticoagulation but this was stopped to do to GI bleed. She takes 81 mg of aspirin. Emergency department physician requested a cardiology consult. Will await recommendations  Active Problems: Acute on chronic diastolic CHF (congestive heart failure), NYHA class 3: Chart review indicates seen bilateral bower cardiology in March of this year. That time she had weight gain and worsening lower extremity edema as well as congestion in her lungs at that time her Lasix was changed  to torsemide. Will continue this for now. Will monitor daily weight and obtained intake and output. Will defer to cardiology for any medications adjustments needed.  Patient had an echo in April 2014 at [place]. Will repeat.   CAD (coronary artery disease), native coronary artery: Last seen by cardiology in March of this year. At  that  time she was complaining of fatigue and her metoprolol was decreased. See #1 and #2. Initial troponin negative. Will cycle. Will continue aspirin. Patient states that she was on a statin in the past but she has stopped taking it do to side effects.     Peripheral edema: Patient reports some worsening. Will continue home torsemide for now. Patient reports she stopped taking torsemide because "it has Lipitor and it and I don't take Lipitor".     HTN (hypertension), benign: Only fair control in the emergency department. Home meds include Cardizem and lisinopril. Blood pressure during my exam was 154/94. Will continue lisinopril and monitor closely given #1      Anxiety disorder: Appears stable at baseline. Continue home meds      Dr Harl Bowie with cardiology per ED MD.     Code Status: full Family Communication: none present Disposition Plan: home when ready  Time spent: 60 minutes  [facility] Pager [phone]

## 2013-11-03 NOTE — Telephone Encounter (Signed)
Message copied by Cline Crock on Tue Nov 03, 2013 12:25 PM ------      Message from: Chevis Pretty      Created: Thu Oct 22, 2013 12:17 PM       Kidney and liver function stable      ldl particle numbers look bad- make sure take lipitor daily      Continue current meds- low fat diet and exercise and recheck in 3 months       ------

## 2013-11-04 DIAGNOSIS — I509 Heart failure, unspecified: Secondary | ICD-10-CM | POA: Diagnosis not present

## 2013-11-04 DIAGNOSIS — I959 Hypotension, unspecified: Secondary | ICD-10-CM | POA: Diagnosis not present

## 2013-11-04 DIAGNOSIS — I4891 Unspecified atrial fibrillation: Secondary | ICD-10-CM | POA: Diagnosis not present

## 2013-11-04 DIAGNOSIS — F411 Generalized anxiety disorder: Secondary | ICD-10-CM | POA: Diagnosis not present

## 2013-11-04 DIAGNOSIS — I5033 Acute on chronic diastolic (congestive) heart failure: Secondary | ICD-10-CM | POA: Diagnosis not present

## 2013-11-04 LAB — TROPONIN I: Troponin I: <0.3 ng/mL (ref <0.30)

## 2013-11-04 LAB — CBC
HEMATOCRIT: 39.1 % (ref 36.0–46.0)
Hemoglobin: 12.5 g/dL (ref 12.0–15.0)
MCH: 26.8 pg (ref 26.0–34.0)
MCHC: 32 g/dL (ref 30.0–36.0)
MCV: 83.7 fL (ref 78.0–100.0)
Platelets: 194 10*3/uL (ref 150–400)
RBC: 4.67 MIL/uL (ref 3.87–5.11)
RDW: 16.4 % — ABNORMAL HIGH (ref 11.5–15.5)
WBC: 7.2 10*3/uL (ref 4.0–10.5)

## 2013-11-04 LAB — BASIC METABOLIC PANEL
BUN: 14 mg/dL (ref 6–23)
CO2: 31 mEq/L (ref 19–32)
Calcium: 9 mg/dL (ref 8.4–10.5)
Chloride: 103 mEq/L (ref 96–112)
Creatinine, Ser: 1.24 mg/dL — ABNORMAL HIGH (ref 0.50–1.10)
GFR calc Af Amer: 49 mL/min — ABNORMAL LOW (ref >90)
GFR, EST NON AFRICAN AMERICAN: 42 mL/min — AB (ref >90)
GLUCOSE: 145 mg/dL — AB (ref 70–99)
Potassium: 3.7 mEq/L (ref 3.7–5.3)
Sodium: 144 mEq/L (ref 137–147)

## 2013-11-04 MED ORDER — AMIODARONE HCL IN DEXTROSE 360-4.14 MG/200ML-% IV SOLN
60.0000 mg/h | INTRAVENOUS | Status: AC
  Administered 2013-11-04 (×2): 60 mg/h via INTRAVENOUS
  Filled 2013-11-04 (×2): qty 200

## 2013-11-04 MED ORDER — DIGOXIN 0.25 MG/ML IJ SOLN
0.5000 mg | Freq: Once | INTRAMUSCULAR | Status: AC
  Administered 2013-11-04: 0.5 mg via INTRAVENOUS
  Filled 2013-11-04: qty 2

## 2013-11-04 MED ORDER — ALPRAZOLAM 0.5 MG PO TABS
1.0000 mg | ORAL_TABLET | Freq: Four times a day (QID) | ORAL | Status: DC | PRN
  Administered 2013-11-04 – 2013-11-06 (×6): 1 mg via ORAL
  Filled 2013-11-04 (×6): qty 2

## 2013-11-04 MED ORDER — DIGOXIN 0.25 MG/ML IJ SOLN
0.2500 mg | Freq: Four times a day (QID) | INTRAMUSCULAR | Status: DC
  Administered 2013-11-04: 0.25 mg via INTRAVENOUS
  Filled 2013-11-04: qty 2

## 2013-11-04 MED ORDER — ASPIRIN EC 81 MG PO TBEC
81.0000 mg | DELAYED_RELEASE_TABLET | Freq: Every day | ORAL | Status: DC
  Administered 2013-11-04 – 2013-11-06 (×3): 81 mg via ORAL
  Filled 2013-11-04 (×3): qty 1

## 2013-11-04 MED ORDER — DILTIAZEM LOAD VIA INFUSION
10.0000 mg | Freq: Once | INTRAVENOUS | Status: AC
  Administered 2013-11-04: 10 mg via INTRAVENOUS
  Filled 2013-11-04: qty 10

## 2013-11-04 MED ORDER — AMIODARONE HCL IN DEXTROSE 360-4.14 MG/200ML-% IV SOLN
30.0000 mg/h | INTRAVENOUS | Status: AC
  Administered 2013-11-05: 30 mg/h via INTRAVENOUS
  Filled 2013-11-04: qty 200

## 2013-11-04 MED ORDER — AMIODARONE LOAD VIA INFUSION
150.0000 mg | Freq: Once | INTRAVENOUS | Status: AC
  Administered 2013-11-04: 150 mg via INTRAVENOUS
  Filled 2013-11-04: qty 83.34

## 2013-11-04 MED ORDER — FENOFIBRATE 54 MG PO TABS
54.0000 mg | ORAL_TABLET | Freq: Every day | ORAL | Status: DC
  Administered 2013-11-04 – 2013-11-05 (×2): 54 mg via ORAL
  Filled 2013-11-04 (×3): qty 1

## 2013-11-04 NOTE — Progress Notes (Signed)
Dr. Harl Bowie notified of patients continued elevated heart rate. MD to place orders. Will continue to monitor.

## 2013-11-04 NOTE — Progress Notes (Signed)
Dr. Harl Bowie notified that patient had converted to NSR.

## 2013-11-04 NOTE — Progress Notes (Signed)
Patient ID: Sharon Compton, female   DOB: Sep 28, 1940, 73 y.o.   MRN: [id number]      Subjective:    No chest pain or palpitations overnight.  Objective:   Temp:  [97.6 F (36.4 C)-98.1 F (36.7 C)] 97.9 F (36.6 C) (05/13 0400) Pulse Rate:  [50-126] 50 (05/12 1330) Resp:  [14-25] 19 (05/13 0630) BP: (71-159)/(31-131) 90/63 mmHg (05/13 0630) SpO2:  [94 %-98 %] 97 % (05/12 1315) Weight:  [200 lb (90.719 kg)-202 lb 6.1 oz (91.8 kg)] 202 lb 6.1 oz (91.8 kg) (05/13 0500) Last BM Date: 11/02/13  Filed Weights   11/03/13 1013 11/03/13 1415 11/04/13 0500  Weight: 200 lb (90.719 kg) 200 lb 9.9 oz (91 kg) 202 lb 6.1 oz (91.8 kg)    Intake/Output Summary (Last 24 hours) at 11/04/13 0725 Last data filed at 11/04/13 0600  Gross per 24 hour  Intake 400.33 ml  Output   2325 ml  Net -1924.67 ml    Telemetry: afin, rates 80s-130s  Exam:  General: NAD  Resp: CTAB  Cardiac: irreg, rate 110, no m/r/g, no JVD  GI: abdomen soft, NT, ND  MSK: +non-pitting edema  Neuro: no focal deficits  Psych: appropriate affect  Lab Results:  Basic Metabolic Panel:  Recent Labs Lab 11/03/13 1026 11/04/13 0532  NA 141 144  K 4.3 3.7  CL 103 103  CO2 27 31  GLUCOSE 141* 145*  BUN 15 14  CREATININE 1.00 1.24*  CALCIUM 9.3 9.0    Liver Function Tests: No results found for this basename: AST, ALT, ALKPHOS, BILITOT, PROT, ALBUMIN,  in the last 168 hours  CBC:  Recent Labs Lab 11/03/13 1026 11/04/13 0532  WBC 5.9 7.2  HGB 13.6 12.5  HCT 42.1 39.1  MCV 83.0 83.7  PLT 190 194    Cardiac Enzymes:  Recent Labs Lab 11/03/13 1026 11/03/13 1825 11/03/13 2155  TROPONINI <0.30 <0.30 <0.30    BNP: No results found for this basename: PROBNP,  in the last 8760 hours  Coagulation: No results found for this basename: INR,  in the last 168 hours  ECG:   Medications:   Scheduled Medications: . atorvastatin  40 mg Oral q1800  . enoxaparin (LOVENOX) injection  40 mg  Subcutaneous Q24H  . gabapentin  400 mg Oral QID  . iron polysaccharides  150 mg Oral Daily  . lisinopril  5 mg Oral Daily  . pantoprazole  40 mg Oral Daily  . potassium chloride SA  20 mEq Oral Daily  . sodium chloride  3 mL Intravenous Q12H  . torsemide  20 mg Oral Daily     Infusions: . diltiazem (CARDIZEM) infusion 10 mg/hr (11/04/13 0430)     PRN Medications:  sodium chloride, acetaminophen, acetaminophen, ALPRAZolam, alum & mag hydroxide-simeth, bisacodyl, morphine injection, ondansetron (ZOFRAN) IV, ondansetron, sodium chloride, traZODone     Assessment/Plan    1. Afib with RVR - on dilt drip overnight, mixed tolerance due to low blood pressures. Low bp's at times to 80s. - given soft bp's with continue elevated rates, will start digoxin. Will IV load with 0.5mg  IV x 1 now and then 0.25mg  IV q 6 hrs x 2. Likely try to add back oral dilt later today pending blood pressures. Likely start oral dilt for mainentence tomorrow.  - she has not been on anticoag due to prior GI bleed, will not initiate this admission - will hold her lisnopril in setting of low bp's  2. CAD - no evidence  of ACS - continue secondary prevention and risk factor modification  3. Chronic LE edema - negative 1.9 liters yesterday, Cr trending up. Soft blood pressures overnight - will hold toresmide today      Carlyle Dolly, M.D., F.A.C.C.

## 2013-11-04 NOTE — Progress Notes (Signed)
[facility] PROGRESS NOTE  IKHLAS ALBO WGN:[id number] DOB: 27-Feb-1941 DOA: 11/03/2013 PCP: Redge Gainer, MD  Assessment/Plan: 1-A fib RVR; didn't tolerate IV Cardizem Gtt, develops hypotension. Digoxin ordered. Continue with baby aspirin. Appreciate cardiology help.  2-CAD (coronary artery disease), native coronary artery; continue with aspirin/  3-Hyperlipidemia; will try tricor. She doesn't tolerates statins.  4-Acute on chronic diastolic CHF (congestive heart failure), NYHA class 3; holding torsemide due to hypotension. Holding lisinopril due to hypotension.  5-Chest pain. In setting of A fib. Troponin times 3 negative. Had one episode of chest pain, will repeat troponin.      Code Status: Full Code.  Family Communication: Care discussed with patient Disposition Plan: [facility]   Consultants:  Cardiology  Procedures:  none  Antibiotics:  none  HPI/Subjective: Had some chest pain earlier today. Chest pain free now. She needs her nerve pill 4 times a day. She cant not tolerates lipitor, or simvastatin. She does not take plavix only baby aspirin.   Objective: Filed Vitals:   11/04/13 0845  BP: 106/40  Pulse:   Temp:   Resp: 19    Intake/Output Summary (Last 24 hours) at 11/04/13 0925 Last data filed at 11/04/13 0600  Gross per 24 hour  Intake 400.33 ml  Output   2325 ml  Net -1924.67 ml   Filed Weights   11/03/13 1013 11/03/13 1415 11/04/13 0500  Weight: 90.719 kg (200 lb) 91 kg (200 lb 9.9 oz) 91.8 kg (202 lb 6.1 oz)    Exam:   General:  No distress.   Cardiovascular: S 1, S 2 IRR  Respiratory: CTA  Abdomen: Bs present, soft, NT.  Musculoskeletal: plus 2 edema.   Data Reviewed: Basic Metabolic Panel:  Recent Labs Lab 11/03/13 1026 11/04/13 0532  NA 141 144  K 4.3 3.7  CL 103 103  CO2 27 31  GLUCOSE 141* 145*  BUN 15 14  CREATININE 1.00 1.24*  CALCIUM 9.3 9.0   Liver Function Tests: No results found for this basename:  AST, ALT, ALKPHOS, BILITOT, PROT, ALBUMIN,  in the last 168 hours No results found for this basename: LIPASE, AMYLASE,  in the last 168 hours No results found for this basename: AMMONIA,  in the last 168 hours CBC:  Recent Labs Lab 11/03/13 1026 11/04/13 0532  WBC 5.9 7.2  NEUTROABS 3.1  --   HGB 13.6 12.5  HCT 42.1 39.1  MCV 83.0 83.7  PLT 190 194   Cardiac Enzymes:  Recent Labs Lab 11/03/13 1026 11/03/13 1825 11/03/13 2155  TROPONINI <0.30 <0.30 <0.30   BNP (last 3 results) No results found for this basename: PROBNP,  in the last 8760 hours CBG: No results found for this basename: GLUCAP,  in the last 168 hours  Recent Results (from the past 240 hour(s))  MRSA PCR SCREENING     Status: None   Collection Time    11/03/13  2:22 PM      Result Value Ref Range Status   MRSA by PCR NEGATIVE  NEGATIVE Final   Comment:            The GeneXpert MRSA Assay (FDA     approved for NASAL specimens     only), is one component of a     comprehensive MRSA colonization     surveillance program. It is not     intended to diagnose MRSA     infection nor to guide or     monitor treatment for  MRSA infections.     Studies: Dg Chest Portable 1 View  11/03/2013   CLINICAL DATA:  Chest pain and tachycardia  EXAM: PORTABLE CHEST - 1 VIEW  COMPARISON:  September 10, 2013  FINDINGS: Lungs are clear. Heart size and pulmonary vascularity are normal. No adenopathy. No bone lesions. No pneumothorax.  IMPRESSION: No edema or consolidation.   Electronically Signed   By: Lowella Grip M.D.   On: 11/03/2013 10:35    Scheduled Meds: . atorvastatin  40 mg Oral q1800  . digoxin  0.25 mg Intravenous Q6H  . digoxin  0.5 mg Intravenous Once  . enoxaparin (LOVENOX) injection  40 mg Subcutaneous Q24H  . gabapentin  400 mg Oral QID  . iron polysaccharides  150 mg Oral Daily  . pantoprazole  40 mg Oral Daily  . potassium chloride SA  20 mEq Oral Daily  . sodium chloride  3 mL Intravenous Q12H    Continuous Infusions: . diltiazem (CARDIZEM) infusion Stopped (11/04/13 0801)    Principal Problem:   Atrial fibrillation with RVR Active Problems:   Anxiety disorder   Peripheral edema   HTN (hypertension), benign   Acute on chronic diastolic CHF (congestive heart failure), NYHA class 3   CAD (coronary artery disease), native coronary artery    Time spent: 35 minutes.     [facility] Pager [phone]. If 7PM-7AM, please contact night-coverage at [email or web address], password [facility] 11/04/2013, 9:25 AM  LOS: 1 day

## 2013-11-04 NOTE — Progress Notes (Signed)
Dr. Harl Bowie notified of patients continued elevated HR. Orders to give scheduled dose of digoxin 0.25mg  IV now and continue to monitor.

## 2013-11-04 NOTE — Progress Notes (Signed)
Soft blood pressures throughout day, not able to tolerate IV dilt. Given digoxin 0.5mg  IV x 1 along with additional dose later in afternoon at 0.25mg . Rates remain elevated to 130s. Will start amiodarone gtt, with initial bolus followed by continious drip.    Zandra Abts MD

## 2013-11-05 DIAGNOSIS — I5033 Acute on chronic diastolic (congestive) heart failure: Secondary | ICD-10-CM | POA: Diagnosis not present

## 2013-11-05 DIAGNOSIS — I509 Heart failure, unspecified: Secondary | ICD-10-CM | POA: Diagnosis not present

## 2013-11-05 DIAGNOSIS — I4891 Unspecified atrial fibrillation: Secondary | ICD-10-CM | POA: Diagnosis not present

## 2013-11-05 DIAGNOSIS — F411 Generalized anxiety disorder: Secondary | ICD-10-CM | POA: Diagnosis not present

## 2013-11-05 LAB — CBC
HEMATOCRIT: 38.7 % (ref 36.0–46.0)
HEMOGLOBIN: 12.2 g/dL (ref 12.0–15.0)
MCH: 26.8 pg (ref 26.0–34.0)
MCHC: 31.5 g/dL (ref 30.0–36.0)
MCV: 85.1 fL (ref 78.0–100.0)
Platelets: 169 10*3/uL (ref 150–400)
RBC: 4.55 MIL/uL (ref 3.87–5.11)
RDW: 16.5 % — AB (ref 11.5–15.5)
WBC: 6.6 10*3/uL (ref 4.0–10.5)

## 2013-11-05 LAB — BASIC METABOLIC PANEL
BUN: 14 mg/dL (ref 6–23)
CALCIUM: 8.4 mg/dL (ref 8.4–10.5)
CO2: 32 mEq/L (ref 19–32)
CREATININE: 1.12 mg/dL — AB (ref 0.50–1.10)
Chloride: 102 mEq/L (ref 96–112)
GFR calc Af Amer: 55 mL/min — ABNORMAL LOW (ref >90)
GFR calc non Af Amer: 48 mL/min — ABNORMAL LOW (ref >90)
GLUCOSE: 141 mg/dL — AB (ref 70–99)
POTASSIUM: 4 meq/L (ref 3.7–5.3)
Sodium: 142 mEq/L (ref 137–147)

## 2013-11-05 MED ORDER — AMIODARONE HCL 200 MG PO TABS
400.0000 mg | ORAL_TABLET | Freq: Two times a day (BID) | ORAL | Status: DC
  Administered 2013-11-05 – 2013-11-06 (×2): 400 mg via ORAL
  Filled 2013-11-05 (×2): qty 2

## 2013-11-05 NOTE — Care Management Note (Addendum)
    Page 1 of 1   11/06/2013     10:50:03 AM CARE MANAGEMENT NOTE 11/06/2013  Patient:  Sharon Compton, Sharon Compton   Account Number:  [id number]  Date Initiated:  11/05/2013  Documentation initiated by:  Theophilus Kinds  Subjective/Objective Assessment:   Pt admitted from home with a fib. Pt lives with her husband and will return home at discharge. Pt is independent with ADL's.     Action/Plan:   No CM needs noted.   Anticipated DC Date:  11/06/2013   Anticipated DC Plan:  [place]  CM consult      Choice offered to / List presented to:             Status of service:  Completed, signed off Medicare Important Message given?  YES (If response is "NO", the following Medicare IM given date fields will be blank) Date Medicare IM given:  11/06/2013 Date Additional Medicare IM given:    Discharge Disposition:  HOME/SELF CARE  Per UR Regulation:    If discussed at Long Length of Stay Meetings, dates discussed:    Comments:  11/06/13 [place], RN BSN CM Pt discharged home today. No CM needs noted.  11/05/13 [place], RN BSN CM

## 2013-11-05 NOTE — Progress Notes (Signed)
Patient ID: Sharon Compton, female   DOB: 1941/01/27, 73 y.o.   MRN: [id number]     Subjective:    Feels better this morning  Objective:   Temp:  [98.1 F (36.7 C)-100 F (37.8 C)] 100 F (37.8 C) (05/14 0400) Resp:  [14-24] 20 (05/14 0845) BP: (78-147)/(34-129) 129/64 mmHg (05/14 0845) Weight:  [201 lb 1 oz (91.2 kg)] 201 lb 1 oz (91.2 kg) (05/14 0500) Last BM Date: 11/02/13  Filed Weights   11/03/13 1415 11/04/13 0500 11/05/13 0500  Weight: 200 lb 9.9 oz (91 kg) 202 lb 6.1 oz (91.8 kg) 201 lb 1 oz (91.2 kg)    Intake/Output Summary (Last 24 hours) at 11/05/13 0901 Last data filed at 11/05/13 0800  Gross per 24 hour  Intake 460.72 ml  Output    650 ml  Net -189.28 ml    Telemetry: NSR rates 90s  Exam:  General: NAD  Resp: CTAB  Cardiac: RRR, 2/6 systolic murmur LLSB, no JVD  GI: abdomen soft, NT, ND  MSK: no LE edema  Neuro: no focal deficits  Psych: appropriate affect  Lab Results:  Basic Metabolic Panel:  Recent Labs Lab 11/03/13 1026 11/04/13 0532 11/05/13 0443  NA 141 144 142  K 4.3 3.7 4.0  CL 103 103 102  CO2 27 31 32  GLUCOSE 141* 145* 141*  BUN 15 14 14   CREATININE 1.00 1.24* 1.12*  CALCIUM 9.3 9.0 8.4    Liver Function Tests: No results found for this basename: AST, ALT, ALKPHOS, BILITOT, PROT, ALBUMIN,  in the last 168 hours  CBC:  Recent Labs Lab 11/03/13 1026 11/04/13 0532 11/05/13 0443  WBC 5.9 7.2 6.6  HGB 13.6 12.5 12.2  HCT 42.1 39.1 38.7  MCV 83.0 83.7 85.1  PLT 190 194 169    Cardiac Enzymes:  Recent Labs Lab 11/03/13 1825 11/03/13 2155 11/04/13 1004  TROPONINI <0.30 <0.30 <0.30    BNP: No results found for this basename: PROBNP,  in the last 8760 hours  Coagulation: No results found for this basename: INR,  in the last 168 hours  ECG:   Medications:   Scheduled Medications: . aspirin EC  81 mg Oral Daily  . enoxaparin (LOVENOX) injection  40 mg Subcutaneous Q24H  . fenofibrate  54 mg Oral  Daily  . gabapentin  400 mg Oral QID  . iron polysaccharides  150 mg Oral Daily  . pantoprazole  40 mg Oral Daily  . sodium chloride  3 mL Intravenous Q12H     Infusions: . amiodarone 30 mg/hr (11/05/13 0445)     PRN Medications:  sodium chloride, acetaminophen, acetaminophen, ALPRAZolam, alum & mag hydroxide-simeth, bisacodyl, morphine injection, ondansetron (ZOFRAN) IV, ondansetron, sodium chloride, traZODone     Assessment/Plan    1. Afib with RVR  - bp did not tolerate dilt gtt, stopped yesterday. Tried digoxin loading without succesful rate control. Started on amio gtt yesterday later afternoon with conversion to NSR. QTc withint acceptable limits.  - she has not been on anticoag due to prior GI bleed, will not initiate this admission  - holding lisnopril in setting of low bp's  - bps' better this AM at 120/60, continue to follow. May add back low dose dilt if remains stable.  - complete IV amio 24 hour load, then will change to amio oral 400mg  bid x 7 days, then 400mg  qday x 3 weeks, then 200mg  daily for maintence. Will add TSH and liver panel on for tomorrow to establish  baseline.  - if rates and bp remain stable anticipate possible discharge tomorrow  2. CAD  - no evidence of ACS  - continue secondary prevention and risk factor modification   3. Chronic LE edema  - held yesterday after Cr bump, Cr trending down. Consider restarting tomorrow.         Carlyle Dolly, M.D., F.A.C.C.

## 2013-11-05 NOTE — Progress Notes (Signed)
[facility] PROGRESS NOTE  Sharon Compton VQQ:[id number] DOB: 1941-06-22 DOA: 11/03/2013 PCP: Redge Gainer, MD  Assessment/Plan: 1-A fib RVR; didn't tolerate IV Cardizem Gtt, developed hypotension. Continue with digoxin. Continue with baby aspirin. Appreciate cardiology help. Hr better on Amiodarone.   2-CAD (coronary artery disease), native coronary artery; continue with aspirin/  3-Hyperlipidemia; will try tricor. She doesn't tolerates statins.   4-Acute on chronic diastolic CHF (congestive heart failure), NYHA class 3; holding torsemide due to hypotension. Holding lisinopril due to hypotension. Will defer to cardio resuming torsemide.   5-Chest pain. In setting of A fib. Troponin times 3 negative.      Code Status: Full Code.  Family Communication: Care discussed with patient Disposition Plan: [facility]   Consultants:  Cardiology  Procedures:  none  Antibiotics:  none  HPI/Subjective: Feeling better. No chest pain or dyspnea.   Objective: Filed Vitals:   11/05/13 0845  BP: 129/64  Pulse:   Temp:   Resp: 20    Intake/Output Summary (Last 24 hours) at 11/05/13 0903 Last data filed at 11/05/13 0800  Gross per 24 hour  Intake 460.72 ml  Output    650 ml  Net -189.28 ml   Filed Weights   11/03/13 1415 11/04/13 0500 11/05/13 0500  Weight: 91 kg (200 lb 9.9 oz) 91.8 kg (202 lb 6.1 oz) 91.2 kg (201 lb 1 oz)    Exam:   General:  No distress.   Cardiovascular: S 1, S 2 RRR  Respiratory: CTA  Abdomen: Bs present, soft, NT.  Musculoskeletal: plus 2 edema.   Data Reviewed: Basic Metabolic Panel:  Recent Labs Lab 11/03/13 1026 11/04/13 0532 11/05/13 0443  NA 141 144 142  K 4.3 3.7 4.0  CL 103 103 102  CO2 27 31 32  GLUCOSE 141* 145* 141*  BUN 15 14 14   CREATININE 1.00 1.24* 1.12*  CALCIUM 9.3 9.0 8.4   Liver Function Tests: No results found for this basename: AST, ALT, ALKPHOS, BILITOT, PROT, ALBUMIN,  in the last 168 hours No  results found for this basename: LIPASE, AMYLASE,  in the last 168 hours No results found for this basename: AMMONIA,  in the last 168 hours CBC:  Recent Labs Lab 11/03/13 1026 11/04/13 0532 11/05/13 0443  WBC 5.9 7.2 6.6  NEUTROABS 3.1  --   --   HGB 13.6 12.5 12.2  HCT 42.1 39.1 38.7  MCV 83.0 83.7 85.1  PLT 190 194 169   Cardiac Enzymes:  Recent Labs Lab 11/03/13 1026 11/03/13 1825 11/03/13 2155 11/04/13 1004  TROPONINI <0.30 <0.30 <0.30 <0.30   BNP (last 3 results) No results found for this basename: PROBNP,  in the last 8760 hours CBG: No results found for this basename: GLUCAP,  in the last 168 hours  Recent Results (from the past 240 hour(s))  MRSA PCR SCREENING     Status: None   Collection Time    11/03/13  2:22 PM      Result Value Ref Range Status   MRSA by PCR NEGATIVE  NEGATIVE Final   Comment:            The GeneXpert MRSA Assay (FDA     approved for NASAL specimens     only), is one component of a     comprehensive MRSA colonization     surveillance program. It is not     intended to diagnose MRSA     infection nor to guide or  monitor treatment for     MRSA infections.     Studies: Dg Chest Portable 1 View  11/03/2013   CLINICAL DATA:  Chest pain and tachycardia  EXAM: PORTABLE CHEST - 1 VIEW  COMPARISON:  September 10, 2013  FINDINGS: Lungs are clear. Heart size and pulmonary vascularity are normal. No adenopathy. No bone lesions. No pneumothorax.  IMPRESSION: No edema or consolidation.   Electronically Signed   By: Lowella Grip M.D.   On: 11/03/2013 10:35    Scheduled Meds: . aspirin EC  81 mg Oral Daily  . enoxaparin (LOVENOX) injection  40 mg Subcutaneous Q24H  . fenofibrate  54 mg Oral Daily  . gabapentin  400 mg Oral QID  . iron polysaccharides  150 mg Oral Daily  . pantoprazole  40 mg Oral Daily  . sodium chloride  3 mL Intravenous Q12H   Continuous Infusions: . amiodarone 30 mg/hr (11/05/13 0445)    Principal Problem:    Atrial fibrillation with RVR Active Problems:   Anxiety disorder   Peripheral edema   HTN (hypertension), benign   Acute on chronic diastolic CHF (congestive heart failure), NYHA class 3   CAD (coronary artery disease), native coronary artery    Time spent: 30 minutes.     [facility] Pager [phone]. If 7PM-7AM, please contact night-coverage at [email or web address], password [facility] 11/05/2013, 9:03 AM  LOS: 2 days

## 2013-11-06 DIAGNOSIS — I509 Heart failure, unspecified: Secondary | ICD-10-CM | POA: Diagnosis not present

## 2013-11-06 DIAGNOSIS — I5033 Acute on chronic diastolic (congestive) heart failure: Secondary | ICD-10-CM | POA: Diagnosis not present

## 2013-11-06 DIAGNOSIS — I4891 Unspecified atrial fibrillation: Secondary | ICD-10-CM | POA: Diagnosis not present

## 2013-11-06 LAB — TSH: TSH: 5.12 u[IU]/mL — ABNORMAL HIGH (ref 0.350–4.500)

## 2013-11-06 LAB — CBC
HEMATOCRIT: 39 % (ref 36.0–46.0)
HEMOGLOBIN: 12.4 g/dL (ref 12.0–15.0)
MCH: 27 pg (ref 26.0–34.0)
MCHC: 31.8 g/dL (ref 30.0–36.0)
MCV: 84.8 fL (ref 78.0–100.0)
Platelets: 161 10*3/uL (ref 150–400)
RBC: 4.6 MIL/uL (ref 3.87–5.11)
RDW: 16.3 % — ABNORMAL HIGH (ref 11.5–15.5)
WBC: 4.8 10*3/uL (ref 4.0–10.5)

## 2013-11-06 LAB — HEPATIC FUNCTION PANEL
ALK PHOS: 84 U/L (ref 39–117)
ALT: 11 U/L (ref 0–35)
AST: 18 U/L (ref 0–37)
Albumin: 3 g/dL — ABNORMAL LOW (ref 3.5–5.2)
BILIRUBIN TOTAL: 0.3 mg/dL (ref 0.3–1.2)
Bilirubin, Direct: <0.2 mg/dL (ref 0.0–0.3)
Total Protein: 6.4 g/dL (ref 6.0–8.3)

## 2013-11-06 LAB — BASIC METABOLIC PANEL
BUN: 10 mg/dL (ref 6–23)
CALCIUM: 8.9 mg/dL (ref 8.4–10.5)
CO2: 31 mEq/L (ref 19–32)
Chloride: 104 mEq/L (ref 96–112)
Creatinine, Ser: 0.9 mg/dL (ref 0.50–1.10)
GFR calc Af Amer: 72 mL/min — ABNORMAL LOW (ref >90)
GFR, EST NON AFRICAN AMERICAN: 62 mL/min — AB (ref >90)
GLUCOSE: 115 mg/dL — AB (ref 70–99)
Potassium: 4.3 mEq/L (ref 3.7–5.3)
SODIUM: 145 meq/L (ref 137–147)

## 2013-11-06 MED ORDER — AMIODARONE HCL 200 MG PO TABS: ORAL_TABLET | ORAL | Status: DC

## 2013-11-06 MED ORDER — TORSEMIDE 20 MG PO TABS
20.0000 mg | ORAL_TABLET | Freq: Every day | ORAL | Status: DC
  Administered 2013-11-06: 20 mg via ORAL
  Filled 2013-11-06: qty 1

## 2013-11-06 MED ORDER — CLOPIDOGREL BISULFATE 75 MG PO TABS: 75.0000 mg | ORAL_TABLET | Freq: Every day | ORAL | Status: DC

## 2013-11-06 NOTE — Discharge Summary (Signed)
Physician Discharge Summary  Sharon Compton TFT:[id number] DOB: 1941-03-30 DOA: 11/03/2013  PCP: Redge Gainer, MD  Admit date: 11/03/2013 Discharge date: 11/06/2013  Time spent: 35 minutes  Recommendations for Outpatient Follow-up:  Needs to follow up with cardiologist for further adjustment of medications for A fib.   Discharge Diagnoses:    Atrial fibrillation with RVR   Anxiety disorder   Peripheral edema   HTN (hypertension), benign   Acute on chronic diastolic CHF (congestive heart failure), NYHA class 3   CAD (coronary artery disease), native coronary artery   Discharge Condition: Stable.   Diet recommendation: [place] healthy diet.   Filed Weights   11/04/13 0500 11/05/13 0500 11/06/13 0500  Weight: 91.8 kg (202 lb 6.1 oz) 91.2 kg (201 lb 1 oz) 89.9 kg (198 lb 3.1 oz)    History of present illness:  Sharon Compton is a 73 y.o. female past medical history that includes A. fib and CAD status post stent placement 6 months ago presents to the emergency department with the chief complaint of chest pain, palpitation and shortness of breath. Initial evaluation in the emergency department includes an EKG revealing atrial fibrillation with rapid ventricular response at a rate of 144. She was started on a Cardizem drip in the emergency department.  Patient reports she was awakened at about 3 AM this morning with left chest pain and palpitations. She describes the pain as sharp located just under her left breast. Associated symptoms include shortness of breath, diaphoresis, nausea with no vomiting. She also reports chronic lower extremity edema that is slightly worse. She denies abdominal pain fever chills dysuria hematuria frequency or urgency. She denies constipation diarrhea melena or hematochezia.  Workup in the emergency department includes basic metabolic panel that is unremarkable, a complete blood count is unremarkable except for a serum glucose of 141. Initial troponin is negative.  Chest x-ray without any acute cardiopulmonary disease. She is hemodynamically stable afebrile and not hypoxic.   Hospital Course:  1-A fib RVR; didn't tolerate IV Cardizem Gtt, developed hypotension. Her rate was not controlled on digoxin. Continue with baby aspirin. Appreciate cardiology help. She was started on amiodarone Gtt. She was transition to 400 mg PO amidore BID for 7 days then 400 mg daily  For 3 weeks then 200 mg daily.  HR controlled on Amiodarone.   2-CAD (coronary artery disease), native coronary artery; continue with aspirin. Will resume plavix. Unclear why patient was not taking plavix.   3-Hyperlipidemia; she doesn't want to try tricor, give her headaches. She will try diet.   4-Acute on chronic diastolic CHF (congestive heart failure), NYHA class 3; holding torsemide due to hypotension. Holding lisinopril due to hypotension. Will resume torsemide.   5-Chest pain. In setting of A fib. Troponin times 3 negative.    Procedures:  none  Consultations:  Cardiology  Discharge Exam: Filed Vitals:   11/06/13 0600  BP: 95/70  Pulse:   Temp:   Resp: 20    General:No acute distress.  Cardiovascular: S 1, S 2 RRR Respiratory: CTA  Discharge Instructions You were cared for by a hospitalist during your hospital stay. If you have any questions about your discharge medications or the care you received while you were in the hospital after you are discharged, you can call the unit and asked to speak with the hospitalist on call if the hospitalist that took care of you is not available. Once you are discharged, your primary care physician will handle any further medical issues.  Please note that NO REFILLS for any discharge medications will be authorized once you are discharged, as it is imperative that you return to your primary care physician (or establish a relationship with a primary care physician if you do not have one) for your aftercare needs so that they can reassess your  need for medications and monitor your lab values.  Discharge Instructions   Diet - low sodium heart healthy    Complete by:  As directed      Increase activity slowly    Complete by:  As directed             Medication List    STOP taking these medications       diltiazem 120 MG 24 hr capsule  Commonly known as:  CARDIZEM CD     lisinopril 5 MG tablet  Commonly known as:  PRINIVIL,ZESTRIL     metoprolol tartrate 25 MG tablet  Commonly known as:  LOPRESSOR      TAKE these medications       ALPRAZolam 1 MG tablet  Commonly known as:  XANAX  TAKE ONE TABLET BY MOUTH 4 TIMES DAILY AS NEEDED     amiodarone 200 MG tablet  Commonly known as:  PACERONE  400mg  twice a day for 7 days, then 400mg  daily  For 3 weeks, then 200mg  daily for maintence     aspirin EC 81 MG tablet  Take 1 tablet (81 mg total) by mouth daily.     cephALEXin 500 MG capsule  Commonly known as:  KEFLEX  Take 1 capsule (500 mg total) by mouth 4 (four) times daily.     clopidogrel 75 MG tablet  Commonly known as:  PLAVIX  Take 1 tablet (75 mg total) by mouth daily with breakfast.     gabapentin 400 MG capsule  Commonly known as:  NEURONTIN  Take 1 capsule (400 mg total) by mouth 4 (four) times daily.     iron polysaccharides 150 MG capsule  Commonly known as:  NIFEREX  Take 1 capsule (150 mg total) by mouth daily.     nitroGLYCERIN 0.4 MG SL tablet  Commonly known as:  NITROSTAT  Place 1 tablet (0.4 mg total) under the tongue every 5 (five) minutes as needed for chest pain.     pantoprazole 40 MG tablet  Commonly known as:  PROTONIX  Take 1 tablet (40 mg total) by mouth daily.     potassium chloride SA 20 MEQ tablet  Commonly known as:  K-DUR,KLOR-CON  Take 1 tablet (20 mEq total) by mouth daily.     torsemide 20 MG tablet  Commonly known as:  DEMADEX  Take 1 tablet (20 mg total) by mouth daily.     traZODone 50 MG tablet  Commonly known as:  DESYREL  2 po qhs       Allergies   Allergen Reactions  . Dye Fdc Red [Red Dye]   . Sulfa Antibiotics Itching  . Codeine Itching and Palpitations       Follow-up Information   Follow up with Carlyle Dolly, F, MD In 2 weeks. (need appointment with NP, Joline Salt )    Specialty:  Cardiology   Contact information:   [address] [place] [address] [phone]        The results of significant diagnostics from this hospitalization (including imaging, microbiology, ancillary and laboratory) are listed below for reference.    Significant Diagnostic Studies: Dg Chest Portable 1  View  11/03/2013   CLINICAL DATA:  Chest pain and tachycardia  EXAM: PORTABLE CHEST - 1 VIEW  COMPARISON:  September 10, 2013  FINDINGS: Lungs are clear. Heart size and pulmonary vascularity are normal. No adenopathy. No bone lesions. No pneumothorax.  IMPRESSION: No edema or consolidation.   Electronically Signed   By: Lowella Grip M.D.   On: 11/03/2013 10:35    Microbiology: Recent Results (from the past 240 hour(s))  MRSA PCR SCREENING     Status: None   Collection Time    11/03/13  2:22 PM      Result Value Ref Range Status   MRSA by PCR NEGATIVE  NEGATIVE Final   Comment:            The GeneXpert MRSA Assay (FDA     approved for NASAL specimens     only), is one component of a     comprehensive MRSA colonization     surveillance program. It is not     intended to diagnose MRSA     infection nor to guide or     monitor treatment for     MRSA infections.     Labs: Basic Metabolic Panel:  Recent Labs Lab 11/03/13 1026 11/04/13 0532 11/05/13 0443 11/06/13 0440  NA 141 144 142 145  K 4.3 3.7 4.0 4.3  CL 103 103 102 104  CO2 27 31 32 31  GLUCOSE 141* 145* 141* 115*  BUN 15 14 14 10   CREATININE 1.00 1.24* 1.12* 0.90  CALCIUM 9.3 9.0 8.4 8.9   Liver Function Tests:  Recent Labs Lab 11/06/13 0440  AST 18  ALT 11  ALKPHOS 84  BILITOT 0.3  PROT 6.4  ALBUMIN 3.0*   No results found for this  basename: LIPASE, AMYLASE,  in the last 168 hours No results found for this basename: AMMONIA,  in the last 168 hours CBC:  Recent Labs Lab 11/03/13 1026 11/04/13 0532 11/05/13 0443 11/06/13 0440  WBC 5.9 7.2 6.6 4.8  NEUTROABS 3.1  --   --   --   HGB 13.6 12.5 12.2 12.4  HCT 42.1 39.1 38.7 39.0  MCV 83.0 83.7 85.1 84.8  PLT 190 194 169 161   Cardiac Enzymes:  Recent Labs Lab 11/03/13 1026 11/03/13 1825 11/03/13 2155 11/04/13 1004  TROPONINI <0.30 <0.30 <0.30 <0.30   BNP: BNP (last 3 results) No results found for this basename: PROBNP,  in the last 8760 hours CBG: No results found for this basename: GLUCAP,  in the last 168 hours     Signed:  Belkys A Regalado  [facility] 11/06/2013, 9:19 AM

## 2013-11-06 NOTE — Progress Notes (Signed)
Patient discharged to home with husband. Discharge instructions regarding diet, activity, follow-up appointments, and medications given.Review of  CHF care, and preventing readmissions reviewed. Pt was able to teach back information.

## 2013-11-06 NOTE — Progress Notes (Addendum)
Patient ID: Sharon Compton, female   DOB: 01-03-1941, 73 y.o.   MRN: [id number]    Subjective:    No complaints this morning   Objective:   Temp:  [98.4 F (36.9 C)-99 F (37.2 C)] 99 F (37.2 C) (05/14 2000) Resp:  [17-24] 20 (05/15 0600) BP: (95-159)/(45-123) 95/70 mmHg (05/15 0600) Weight:  [198 lb 3.1 oz (89.9 kg)] 198 lb 3.1 oz (89.9 kg) (05/15 0500) Last BM Date: 11/02/13  Filed Weights   11/04/13 0500 11/05/13 0500 11/06/13 0500  Weight: 202 lb 6.1 oz (91.8 kg) 201 lb 1 oz (91.2 kg) 198 lb 3.1 oz (89.9 kg)    Intake/Output Summary (Last 24 hours) at 11/06/13 0846 Last data filed at 11/06/13 0300  Gross per 24 hour  Intake 134.99 ml  Output    800 ml  Net -665.01 ml    Telemetry: NSR and sinus tach low 100s  Exam:  General: NAD  Resp: CTAB  Cardiac: RRR, 2/6 systolic murmur LLSB, no JVD  GI:  Abdomen soft, NT, ND  MSK: bilteral non-pitting edema  Neuro: no focal deficits  Psych: appropriate affect  Lab Results:  Basic Metabolic Panel:  Recent Labs Lab 11/04/13 0532 11/05/13 0443 11/06/13 0440  NA 144 142 145  K 3.7 4.0 4.3  CL 103 102 104  CO2 31 32 31  GLUCOSE 145* 141* 115*  BUN 14 14 10   CREATININE 1.24* 1.12* 0.90  CALCIUM 9.0 8.4 8.9    Liver Function Tests:  Recent Labs Lab 11/06/13 0440  AST 18  ALT 11  ALKPHOS 84  BILITOT 0.3  PROT 6.4  ALBUMIN 3.0*    CBC:  Recent Labs Lab 11/04/13 0532 11/05/13 0443 11/06/13 0440  WBC 7.2 6.6 4.8  HGB 12.5 12.2 12.4  HCT 39.1 38.7 39.0  MCV 83.7 85.1 84.8  PLT 194 169 161    Cardiac Enzymes:  Recent Labs Lab 11/03/13 1825 11/03/13 2155 11/04/13 1004  TROPONINI <0.30 <0.30 <0.30    BNP: No results found for this basename: PROBNP,  in the last 8760 hours  Coagulation: No results found for this basename: INR,  in the last 168 hours  ECG:   Medications:   Scheduled Medications: . amiodarone  400 mg Oral BID  . aspirin EC  81 mg Oral Daily  . enoxaparin  (LOVENOX) injection  40 mg Subcutaneous Q24H  . gabapentin  400 mg Oral QID  . iron polysaccharides  150 mg Oral Daily  . pantoprazole  40 mg Oral Daily  . sodium chloride  3 mL Intravenous Q12H  . torsemide  20 mg Oral Daily     Infusions:     PRN Medications:  sodium chloride, acetaminophen, acetaminophen, ALPRAZolam, alum & mag hydroxide-simeth, bisacodyl, morphine injection, ondansetron (ZOFRAN) IV, ondansetron, sodium chloride, traZODone     Assessment/Plan    1. Afib with RVR  - bp did not tolerate dilt gtt. Tried digoxin loading without succesful rate control. Started on amio gtt with conversion to NSR, converted to oral yestereday. QTc withint acceptable limits. Rates by vitals in 80s.  - she has not been on anticoag due to prior GI bleed, will not initiate this admission  - holding lisnopril in setting of low bp's, they have improved though somewhat up and down.  - continue amio 400mg  bid x 7 days, then 400mg  daily x 3 weeks, then 200mg  daily.  2. CAD  - no evidence of ACS  - continue secondary prevention and risk factor  modification  - she has stopped her plavix at home for unclear reasons, we were originally under the assumption she was still taking. She had a BMS placed in 02/2013 with plans for a minimum of 1 month of DAPT, preferably a year if she can tolerate given her history of bleeding issues. She has not had any bleeding issues, will resume plavix 75mg  daily at home at least until 02/2014  3. Chronic LE edema  - consider retarting toresmide today   No further cardiac testing or interventions planned at this point, she will need to follow up with NP Lawrence in 2 weeks. Will signoff of inpatient care, amiodarone dosing as described above. Call with questions.      Carlyle Dolly, M.D., F.A.C.C.

## 2013-11-09 NOTE — Progress Notes (Signed)
UR chart review completed.  

## 2013-11-10 ENCOUNTER — Other Ambulatory Visit: Payer: Self-pay

## 2013-11-10 ENCOUNTER — Other Ambulatory Visit: Payer: Self-pay | Admitting: Nurse Practitioner

## 2013-11-10 DIAGNOSIS — E039 Hypothyroidism, unspecified: Secondary | ICD-10-CM

## 2013-11-12 LAB — T4, FREE: Free T4: 1.01 ng/dL (ref 0.80–1.80)

## 2013-11-12 LAB — T3: T3 TOTAL: 70.5 ng/dL — AB (ref 80.0–204.0)

## 2013-11-12 NOTE — Telephone Encounter (Signed)
Last filled 10/14/13, last seen 10/21/13

## 2013-11-13 ENCOUNTER — Telehealth: Payer: Self-pay | Admitting: *Deleted

## 2013-11-13 NOTE — Telephone Encounter (Signed)
PT WANTS LAB RESULTS, STATES SHE HAD THEM THREE DAYS /TMJ

## 2013-11-13 NOTE — Telephone Encounter (Signed)
Pt notified that further thyroid tests were ok.I forwarded labs to pcp

## 2013-11-14 ENCOUNTER — Telehealth: Payer: Self-pay | Admitting: Nurse Practitioner

## 2013-11-18 ENCOUNTER — Telehealth: Payer: Self-pay | Admitting: Nurse Practitioner

## 2013-11-18 NOTE — Telephone Encounter (Signed)
appt given for tue 6/2 with mmm

## 2013-11-24 ENCOUNTER — Other Ambulatory Visit: Payer: Self-pay | Admitting: Nurse Practitioner

## 2013-11-24 ENCOUNTER — Encounter: Payer: Self-pay | Admitting: Nurse Practitioner

## 2013-11-24 ENCOUNTER — Ambulatory Visit (INDEPENDENT_AMBULATORY_CARE_PROVIDER_SITE_OTHER): Payer: Medicare Other | Admitting: Nurse Practitioner

## 2013-11-24 VITALS — BP 191/101 | HR 74 | Temp 98.5°F | Ht 60.0 in | Wt 200.0 lb

## 2013-11-24 DIAGNOSIS — I10 Essential (primary) hypertension: Secondary | ICD-10-CM

## 2013-11-24 DIAGNOSIS — Z09 Encounter for follow-up examination after completed treatment for conditions other than malignant neoplasm: Secondary | ICD-10-CM | POA: Diagnosis not present

## 2013-11-24 DIAGNOSIS — I4891 Unspecified atrial fibrillation: Secondary | ICD-10-CM

## 2013-11-24 DIAGNOSIS — R300 Dysuria: Secondary | ICD-10-CM

## 2013-11-24 DIAGNOSIS — I2510 Atherosclerotic heart disease of native coronary artery without angina pectoris: Secondary | ICD-10-CM | POA: Diagnosis not present

## 2013-11-24 MED ORDER — LISINOPRIL 40 MG PO TABS: 40.0000 mg | ORAL_TABLET | Freq: Every day | ORAL | Status: DC

## 2013-11-24 MED ORDER — ZOLPIDEM TARTRATE 5 MG PO TABS: 5.0000 mg | ORAL_TABLET | Freq: Every evening | ORAL | Status: DC | PRN

## 2013-11-24 MED ORDER — TRAZODONE HCL 50 MG PO TABS: ORAL_TABLET | ORAL | Status: DC

## 2013-11-24 NOTE — Progress Notes (Signed)
   Subjective:    Patient ID: Sharon Compton, female    DOB: [birth year], 73 y.o.   MRN: [id number]  HPI Patient in today for hospital follow up- SHe was in ICU for 5 days with atrial fib- They started her on metoprolol in hospital. [facility] says that she is doing well. Her blood pressure is really elevated.    Review of Systems  Constitutional: Negative.   HENT: Negative.   Respiratory: Negative.   Cardiovascular: Positive for leg swelling. Negative for chest pain and palpitations.  Genitourinary: Negative.   Neurological: Negative.   Psychiatric/Behavioral: Negative.   All other systems reviewed and are negative.      Objective:   Physical Exam  Constitutional: She is oriented to person, place, and time. She appears well-developed and well-nourished.  Cardiovascular: Normal rate, regular rhythm and normal heart sounds.   Pulmonary/Chest: Effort normal and breath sounds normal.  Abdominal: Soft.  Neurological: She is alert and oriented to person, place, and time.  Skin: Skin is warm and dry.  Psychiatric: She has a normal mood and affect. Her behavior is normal. Judgment and thought content normal.    BP 191/101  Pulse 74  Temp(Src) 98.5 F (36.9 C) (Oral)  Ht 5' (1.524 m)  Wt 200 lb (90.719 kg)  BMI 39.06 kg/m2       Assessment & Plan:   1. Hospital discharge follow-up   2. Atrial fibrillation   3. Hypertension    Meds ordered this encounter  Medications  . DISCONTD: atorvastatin (LIPITOR) 40 MG tablet    Sig: Take 40 mg by mouth daily.  . metoprolol tartrate (LOPRESSOR) 25 MG tablet    Sig: Take 25 mg by mouth 2 (two) times daily.  [name] lisinopril (PRINIVIL,ZESTRIL) 40 MG tablet    Sig: Take 1 tablet (40 mg total) by mouth daily.    Dispense:  90 tablet    Refill:  1    Order Specific Question:  Supervising Provider    Answer:  Chipper Herb [[id number]]   Recheck in 2 days No Na+ in diet Continue all meds as rx  Mary-Margaret Hassell Done, FNP

## 2013-11-24 NOTE — Patient Instructions (Signed)

## 2013-11-24 NOTE — Addendum Note (Signed)
Addended by: Josephine Cables on: 11/24/2013 02:13 PM   Modules accepted: Orders

## 2013-11-25 ENCOUNTER — Telehealth: Payer: Self-pay | Admitting: *Deleted

## 2013-11-25 NOTE — Telephone Encounter (Signed)
Patient went to [facility] to get lisinopril 40 and they wont get it in til today after 2. She was on diltiazem 120 in the past and has some at home should she take one of these today please advise

## 2013-11-25 NOTE — Telephone Encounter (Signed)
Yes that is ok to take one

## 2013-11-25 NOTE — Telephone Encounter (Signed)
Patient aware.

## 2013-11-26 ENCOUNTER — Encounter: Payer: Self-pay | Admitting: Nurse Practitioner

## 2013-11-26 ENCOUNTER — Ambulatory Visit (INDEPENDENT_AMBULATORY_CARE_PROVIDER_SITE_OTHER): Payer: Medicare Other | Admitting: Nurse Practitioner

## 2013-11-26 VITALS — BP 126/74 | HR 69 | Temp 98.2°F | Ht 60.0 in | Wt 200.0 lb

## 2013-11-26 DIAGNOSIS — I10 Essential (primary) hypertension: Secondary | ICD-10-CM | POA: Diagnosis not present

## 2013-11-26 DIAGNOSIS — I2510 Atherosclerotic heart disease of native coronary artery without angina pectoris: Secondary | ICD-10-CM | POA: Diagnosis not present

## 2013-11-26 NOTE — Progress Notes (Signed)
   Subjective:    Patient ID: Sharon Compton, female    DOB: 1941-02-03, 73 y.o.   MRN: [id number]  HPI Patient was seen 2 days ago for hospital follow up- was in hospital with atrial fib. WHen seen in office her blood pressure was elevated. We started her on lisinopril $RemoveBefor'40mg'[id number]$  which she just got yesterday. No side effects so far.    Review of Systems  Constitutional: Negative.   Respiratory: Negative.   Cardiovascular: Negative.   Genitourinary: Negative.   Psychiatric/Behavioral: Negative.   All other systems reviewed and are negative.      Objective:   Physical Exam  Constitutional: She is oriented to person, place, and time. She appears well-developed and well-nourished.  Cardiovascular: Normal rate and normal heart sounds.   Pulmonary/Chest: Effort normal and breath sounds normal.  Neurological: She is alert and oriented to person, place, and time.  Skin: Skin is warm and dry.  Psychiatric: She has a normal mood and affect. Her behavior is normal. Judgment and thought content normal.   BP 126/74  Pulse 69  Temp(Src) 98.2 F (36.8 C) (Oral)  Ht 5' (1.524 m)  Wt 200 lb (90.719 kg)  BMI 39.06 kg/m2        Assessment & Plan:   1. Hypertension    Orders Placed This Encounter  Procedures  . CMP14+EGFR  . Thyroid Panel With TSH    Continue current meds Low NA+ diet Follow up in 3 months  [place], FNP

## 2013-11-26 NOTE — Patient Instructions (Signed)

## 2013-11-27 LAB — CMP14+EGFR
ALBUMIN: 4.2 g/dL (ref 3.5–4.8)
ALK PHOS: 84 IU/L (ref 39–117)
ALT: 14 IU/L (ref 0–32)
AST: 17 IU/L (ref 0–40)
Albumin/Globulin Ratio: 1.8 (ref 1.1–2.5)
BILIRUBIN TOTAL: 0.3 mg/dL (ref 0.0–1.2)
BUN / CREAT RATIO: 13 (ref 11–26)
BUN: 15 mg/dL (ref 8–27)
CO2: 25 mmol/L (ref 18–29)
Calcium: 9.1 mg/dL (ref 8.7–10.3)
Chloride: 103 mmol/L (ref 97–108)
Creatinine, Ser: 1.17 mg/dL — ABNORMAL HIGH (ref 0.57–1.00)
GFR calc non Af Amer: 47 mL/min/{1.73_m2} — ABNORMAL LOW (ref >59)
GFR, EST AFRICAN AMERICAN: 54 mL/min/{1.73_m2} — AB (ref >59)
Globulin, Total: 2.4 g/dL (ref 1.5–4.5)
Glucose: 100 mg/dL — ABNORMAL HIGH (ref 65–99)
Potassium: 4.2 mmol/L (ref 3.5–5.2)
SODIUM: 143 mmol/L (ref 134–144)
Total Protein: 6.6 g/dL (ref 6.0–8.5)

## 2013-11-27 LAB — THYROID PANEL WITH TSH
Free Thyroxine Index: 1.8 (ref 1.2–4.9)
T3 Uptake Ratio: 27 % (ref 24–39)
T4 TOTAL: 6.6 ug/dL (ref 4.5–12.0)
TSH: 9.54 u[IU]/mL — ABNORMAL HIGH (ref 0.450–4.500)

## 2013-11-30 ENCOUNTER — Other Ambulatory Visit: Payer: Self-pay | Admitting: Nurse Practitioner

## 2013-11-30 MED ORDER — LEVOTHYROXINE SODIUM 50 MCG PO TABS: 50.0000 ug | ORAL_TABLET | Freq: Every day | ORAL | Status: DC

## 2013-12-01 ENCOUNTER — Telehealth: Payer: Self-pay | Admitting: Nurse Practitioner

## 2013-12-02 NOTE — Telephone Encounter (Signed)
Patient aware.

## 2013-12-02 NOTE — Telephone Encounter (Signed)
It is oky for her to take meds prescribed- I am aware of her conditions

## 2013-12-10 ENCOUNTER — Other Ambulatory Visit: Payer: Self-pay | Admitting: Nurse Practitioner

## 2013-12-11 ENCOUNTER — Telehealth: Payer: Self-pay | Admitting: Nurse Practitioner

## 2013-12-11 MED ORDER — NYSTATIN 100000 UNIT/GM EX CREA
1.0000 | TOPICAL_CREAM | Freq: Two times a day (BID) | CUTANEOUS | Status: DC
Start: 2013-12-11 — End: 2014-01-28

## 2013-12-11 NOTE — Telephone Encounter (Signed)
Nystatin rx sent to pharmacy 

## 2013-12-13 NOTE — Telephone Encounter (Signed)
Last seen 11/26/13, xanax last filled 11/12/13. Call into [facility]

## 2013-12-14 ENCOUNTER — Telehealth: Payer: Self-pay | Admitting: *Deleted

## 2013-12-14 ENCOUNTER — Other Ambulatory Visit: Payer: Self-pay | Admitting: *Deleted

## 2013-12-14 DIAGNOSIS — K219 Gastro-esophageal reflux disease without esophagitis: Secondary | ICD-10-CM

## 2013-12-14 MED ORDER — PANTOPRAZOLE SODIUM 40 MG PO TBEC: 40.0000 mg | DELAYED_RELEASE_TABLET | Freq: Every day | ORAL | Status: DC

## 2013-12-14 MED ORDER — AMIODARONE HCL 200 MG PO TABS: 200.0000 mg | ORAL_TABLET | Freq: Every day | ORAL | Status: DC

## 2013-12-14 NOTE — Telephone Encounter (Signed)
She should continue amiodarone 200mg  daily, please put in Rx if she needs it. Can she follow up with Curt Bears next week?   Zandra Abts MD

## 2013-12-14 NOTE — Telephone Encounter (Signed)
Called in.

## 2013-12-14 NOTE — Telephone Encounter (Signed)
Dr Harl Bowie seen pt in hospital, will forward to Dr Harl Bowie to review and advise.

## 2013-12-14 NOTE — Telephone Encounter (Signed)
Please call in xanax with 1 refills 

## 2013-12-14 NOTE — Telephone Encounter (Signed)
Pt states she received a message stating that she needed to have a 4 week f/u. I can not find any notation of this. Pt has not been seen in hospital lately and is not due back to follow up til September 2015.  Pt states she has questions about medications she was given and will call back with the name of those and who wrote them for her.

## 2013-12-14 NOTE — Telephone Encounter (Signed)
Can keep taking metoprolol, can we add her on for me at my next available in [place], or [place] if earier. Ok if 2-3 weeks out  Zandra Abts MD

## 2013-12-14 NOTE — Telephone Encounter (Signed)
Made pt aware to pick up amiodarone at her pharmacy. Also, Metoprolol was d/c at discharge, pt is still taking medication and tells me that her BP and pulse is fine. Pt does not want to see a NP or PA. Please advise

## 2013-12-14 NOTE — Telephone Encounter (Signed)
Pt called back   She was given this in hospital and she doesnt not have refills on it and don't know if she still should be taking them.  Amiodarone 200mg   Metoprolol not sure of the dose she should be on.  Tyrell Antonio romero MD   [facility] in [place].

## 2013-12-14 NOTE — Telephone Encounter (Signed)
Made pt aware, pt has appt with Dr Harl Bowie on July 6th at 4:00pm in [place] office.

## 2013-12-18 ENCOUNTER — Telehealth: Payer: Self-pay | Admitting: Cardiology

## 2013-12-18 NOTE — Telephone Encounter (Signed)
Patient has questions regarding her medications. / tgs

## 2013-12-18 NOTE — Telephone Encounter (Signed)
Made pt aware to take medications as Dr Harl Bowie advised on phone conversation on 6/22.

## 2013-12-22 ENCOUNTER — Telehealth: Payer: Self-pay | Admitting: Nurse Practitioner

## 2013-12-24 NOTE — Telephone Encounter (Signed)
Patient aware.

## 2013-12-24 NOTE — Telephone Encounter (Signed)
Tell her to try gold bond with cream after cream dries

## 2013-12-28 ENCOUNTER — Ambulatory Visit: Payer: Self-pay | Admitting: Cardiology

## 2013-12-28 ENCOUNTER — Ambulatory Visit (INDEPENDENT_AMBULATORY_CARE_PROVIDER_SITE_OTHER): Payer: Medicare Other | Admitting: Cardiology

## 2013-12-28 ENCOUNTER — Encounter: Payer: Self-pay | Admitting: Cardiology

## 2013-12-28 VITALS — BP 195/83 | HR 51 | Ht 60.0 in

## 2013-12-28 DIAGNOSIS — I10 Essential (primary) hypertension: Secondary | ICD-10-CM | POA: Diagnosis not present

## 2013-12-28 DIAGNOSIS — I4891 Unspecified atrial fibrillation: Secondary | ICD-10-CM | POA: Diagnosis not present

## 2013-12-28 DIAGNOSIS — I5032 Chronic diastolic (congestive) heart failure: Secondary | ICD-10-CM

## 2013-12-28 DIAGNOSIS — I2510 Atherosclerotic heart disease of native coronary artery without angina pectoris: Secondary | ICD-10-CM

## 2013-12-28 NOTE — Patient Instructions (Signed)
Your physician recommends that you schedule a follow-up appointment in: 2 months. Your physician has recommended you make the following change in your medication:  Stop amiodarone. Continue all other medications the same.

## 2013-12-28 NOTE — Progress Notes (Signed)
Clinical Summary Ms. Frein is a 73 y.o.female seen today for hospital follow up appointment.   1. Afib - recent admit 10/2013 with Afib with RVR. Soft blood pressures did not tolerate dilt gtt. Digoxin loading did not control her rate. Started on amio gtt with conversion to oral amion with good control - not on anticoag due to GI bleed from notes, history is unclear.  - denies any palpitations.  - compliant with meds - blood work by pcp 11/2013, TSH 9.54. In May 2015 TSH was 5.120.  Amio was started mid 10/2013.   GI Bleed history - initial GI bleed 08/2011 on xarelto. Hgb 8.7, transfused 2 units pRBCs. 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 Recs were if bleeding reoccured to consider IR ablation or possible hemicolectomy. Xarelto was stopped for one week then resumed - from 10/06/11 discharge summary, patient admitted with hemoatchezia after recently having been started on xarelto. She had a prior GI bleed related to cecal AV malformations. Admit Hgb 7, ransfused 4 units pRBCs. Initial colonscopy had poor prep, she refused further testing. Recommendation was not to resume anticoagulation and she has not been on since.   2. HTN - compliant with meds - does not check regularly at home   3. Chronic diastolic heart failure - has some DOE walking up stairs, overall ok on level ground - + LE edema. Mixed compliance with toresmide  4. CAD - BMS to RCA 02/2013 - echo 09/2012 LVEF 60-65%, no WMAs - plan for a year of plavix, during admission it became clear she had stopped taking it at home for no clear reason. It was restarted  Past Medical History  Diagnosis Date  . Hypertension   . Anxiety and depression   . Asthma   . Fibromyalgia   . History of pneumonia   . Peripheral edema   . Peptic ulcer disease   . Hiatal hernia   . GERD (gastroesophageal reflux disease)   . A-fib     cardioversion and TEE at  [facility]; pt reports DCCV 2014 at [facility] as well.  . 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 Diverticular disease 10/01/2011  . MI, acute, non ST segment elevation 10/02/2011  . GI bleed     ?recurrent--therefore, no anticoagulant  . COPD (chronic obstructive pulmonary disease)   . Lymphedema   . CAD (coronary artery disease) 02/2013    2.75 x 32 mm Rebel bare metal stent in the distal RCA.      Allergies  Allergen Reactions  . Dye Fdc Red [Red Dye]   . Sulfa Antibiotics Itching  . Codeine Itching and Palpitations     Current Outpatient Prescriptions  Medication Sig Dispense Refill  . ALPRAZolam (XANAX) 1 MG tablet TAKE ONE TABLET BY MOUTH 4 TIMES DAILY AS NEEDED  120 tablet  1  . amiodarone (PACERONE) 200 MG tablet Take 1 tablet (200 mg total) by mouth daily.  90 tablet  0  . aspirin EC 81 MG tablet Take 1 tablet (81 mg total) by mouth daily.  30 tablet  0  . clopidogrel (PLAVIX) 75 MG tablet Take 1 tablet (75 mg total) by mouth daily with breakfast.  30 tablet  3  . gabapentin (NEURONTIN) 400 MG capsule Take 1 capsule (400 mg total) by mouth 4 (four) times daily.  120 capsule  0  . gabapentin (NEURONTIN) 400 MG capsule TAKE ONE CAPSULE BY MOUTH  4 TIMES DAILY  120 capsule  0  . iron polysaccharides (NIFEREX) 150 MG capsule Take 1 capsule (150 mg total) by mouth daily.  30 capsule  1  . levothyroxine (SYNTHROID, LEVOTHROID) 50 MCG tablet Take 1 tablet (50 mcg total) by mouth daily.  30 tablet  3  . lisinopril (PRINIVIL,ZESTRIL) 40 MG tablet Take 1 tablet (40 mg total) by mouth daily.  90 tablet  1  . metoprolol tartrate (LOPRESSOR) 25 MG tablet Take 25 mg by mouth 2 (two) times daily.      . nitroGLYCERIN (NITROSTAT) 0.4 MG SL tablet Place 1 tablet (0.4 mg total) under the tongue every 5 (five) minutes as needed for chest pain.  30 tablet  0  . nystatin cream (MYCOSTATIN) Apply 1 application topically 2 (two) times daily.  30 g  0  . pantoprazole (PROTONIX) 40 MG tablet Take  1 tablet (40 mg total) by mouth daily.  90 tablet  1  . potassium chloride SA (K-DUR,KLOR-CON) 20 MEQ tablet Take 1 tablet (20 mEq total) by mouth daily.  30 tablet  3  . torsemide (DEMADEX) 20 MG tablet Take 1 tablet (20 mg total) by mouth daily.  90 tablet  3  . zolpidem (AMBIEN) 5 MG tablet Take 1 tablet (5 mg total) by mouth at bedtime as needed for sleep.  30 tablet  1   No current facility-administered medications for this visit.     Past Surgical History  Procedure Laterality Date  . Abdominal hysterectomy  age 36    nonmalignant reason  . Cholecystectomy    . Abdominal exploration surgery    . Abd tumor removed      states was 10 lbs, benign  . Knee surgery    . Bladder stent    . Esophagogastroduodenoscopy  08/24/2011    Procedure: ESOPHAGOGASTRODUODENOSCOPY (EGD);  Surgeon: Daneil Dolin, MD;  Location: [facility];  Service: Endoscopy;  Laterality: N/A;  give phenergan 12.5mg  iv 30 mins prior to procedure  . Colonoscopy  08/25/2011    Procedure: COLONOSCOPY;  Surgeon: Daneil Dolin, MD;  Location: [facility];  Service: Endoscopy;  Laterality: N/A;  . Colonoscopy  10/03/2011    Procedure: COLONOSCOPY;  Surgeon: Daneil Dolin, MD;  Location: [facility];  Service: Endoscopy;  Laterality: N/A;  NEEDS PHENERGAN 25 MG IV ON CALL  . Colonoscopy  10/04/2011    Procedure: COLONOSCOPY;  Surgeon: Daneil Dolin, MD;  Location: [facility];  Service: Endoscopy;  Laterality: N/A;  Phenergan 12.5 mg ON CALL  . Transthoracic echocardiogram  09/2011    EF 60-65%, septal hypokinesia  . Cardiovascular stress test  09/2011    equivocal result, most likely low risk; pt refused the recommended cardiac cath to follow this up.  . Coronary angioplasty  02/2013  . Percutaneous coronary stent intervention (pci-s)  02/2013    2.75 x 32 mm Rebel bare metal stent in the distal RCA.      Allergies  Allergen Reactions  . Dye Fdc Red [Red Dye]   . Sulfa Antibiotics Itching  . Codeine Itching  and Palpitations      Family History  Problem Relation Age of Onset  . Diabetes Mother     Deceased  . Hypertension Mother   . Cancer Father     Deceased  . Colon cancer Neg Hx   . Coronary artery disease Mother   . Heart failure Mother      Social History Ms.  Primiano reports that she quit smoking about 18 years ago. Her smoking use included Cigarettes. She has a 15 pack-year smoking history. She has never used smokeless tobacco. Ms. Fayson reports that she does not drink alcohol.   Review of Systems CONSTITUTIONAL: No weight loss, fever, chills, weakness or fatigue.  HEENT: Eyes: No visual loss, blurred vision, double vision or yellow sclerae.No hearing loss, sneezing, congestion, runny nose or sore throat.  SKIN: No rash or itching.  CARDIOVASCULAR: per HPI RESPIRATORY: No shortness of breath, cough or sputum.  GASTROINTESTINAL: No anorexia, nausea, vomiting or diarrhea. No abdominal pain or blood.  GENITOURINARY: No burning on urination, no polyuria NEUROLOGICAL: No headache, dizziness, syncope, paralysis, ataxia, numbness or tingling in the extremities. No change in bowel or bladder control.  MUSCULOSKELETAL: +LE edema LYMPHATICS: No enlarged nodes. No history of splenectomy.  PSYCHIATRIC: No history of depression or anxiety.  ENDOCRINOLOGIC: No reports of sweating, cold or heat intolerance. No polyuria or polydipsia.  [name]   Physical Examination p 60 bp 150/90 Gen: resting comfortably, no acute distress HEENT: no scleral icterus, pupils equal round and reactive, no palptable cervical adenopathy,  CV: RRR, no m/r/g, no JVD, no carotid bruits Resp: Clear to auscultation bilaterally GI: abdomen is soft, non-tender, non-distended, normal bowel sounds, no hepatosplenomegaly MSK: extremities are warm, 2+ bilateral edema Skin: warm, no rash Neuro:  no focal deficits Psych: appropriate affect   Diagnostic Studies 1. Cardiac Cath 02/2013  Hemodynamic Findings:  Ao:  187/87  LV: 178/15/26  RA: 10  RV: 40/11/15  PA: 35/20 (mean 27)  PCWP: 22  Fick Cardiac Output: 5.91 L/min  Fick Cardiac Index: 3.13 L/min/m2  Central Aortic Saturation: 88%  Pulmonary Artery Saturation: 65%  Angiographic Findings:  Left main: No obstructive disease.  Left Anterior Descending Artery: Large caliber vessel that courses to the apex. The mid vessel has a 30% stenosis. The first diagonal Josecarlos Harriott has mild plaque. The second diagonal Neera Teng is small to moderate in caliber with 50% ostial stenosis.  Circumflex Artery: Moderate caliber vessel with no obstructive disease.  Right Coronary Artery: Large dominant vessel with serial 30% proximal and mid stenoses. The distal vessel has a 99% stenosis followed by a 80% stenosis just before the bifurcation. The Posterolateral Javion Holmer is the larger of the two distal branches and has mild diffuse plaque. The PDA is small in caliber with 40% ostial stenosis.  Left Ventricular Angiogram:LVEF=65-70%  PCI Note: The sheath was upsized to a 6 [place] system. She was given a bolus of Angiomax and a drip was started. She was given Plavix 600 mg po x 1. I then engaged the RCA with a JR4 guide. When the ACT was over 200, I passed a BMW wire down the RCA. I then used a 2.5 x 15 Sprinter balloon to pre-dilate the stenosis. I then deployed a 2.75 x 32 mm Rebel bare metal stent in the distal RCA. I post-dilated the distal stented segment with a Euphora 2.75 x 12 mm Vivian balloon x 2. I then post-dilated the proximal stented segment with a 3.0 x 15 mm Euphora [place] balloon x 2. There was an excellent result. The small PDA was jailed by the stent but there was excellent flow down this Navneet Schmuck.    09/2012 Echo LVEF 60-65%, no WMAs, grade I diastolic dysfunction  Assessment and Plan  1. Afib - elevated TSH on amio, will discontinue and continue metoprolol. She has baseline resting low normal heart rate, unclear if she could potentially require more  intense rate control  therapy. Given her history of CAD she would have some limitations on potential alternative antiarrhythmics - review of GI bleeding history as detailed above, 2 episodes of severe bleeding requiring transfusion when previously on xarelto thought to be related to cecal AVMs. Will not restart anticoag, continue ASA  2. HTN - elevated in clinic, she has had mixed compliance with her diuretic and does have some evidence of volume overload - follow bp with increased diuretic compliance  3. Chronic diastolic heart failure - mixed compliance with torsemide, has fairly significant LE edema. Counseled on compliance with meds  4. CAD - BMS to RCA 02/2013, plan for DAPT at least until 02/2014.    F/u 2 months   Arnoldo Lenis, M.D., F.A.C.C.

## 2014-01-05 ENCOUNTER — Telehealth: Payer: Self-pay | Admitting: *Deleted

## 2014-01-05 NOTE — Telephone Encounter (Signed)
Patient c/o having insomnia, no energy, wanting to sleep all the time.  Stated that Sharon Lewandowsky, NP put her on Ambien recently & stating that she has not felt right since then.  No c/o chest pain, SOB, or dizziness.  Advised patient to call PMD for advice as they prescribed this medication.  Patient verbalized understanding.

## 2014-01-09 ENCOUNTER — Other Ambulatory Visit: Payer: Self-pay | Admitting: Nurse Practitioner

## 2014-01-22 ENCOUNTER — Telehealth: Payer: Self-pay | Admitting: Nurse Practitioner

## 2014-01-22 ENCOUNTER — Telehealth: Payer: Self-pay | Admitting: *Deleted

## 2014-01-22 NOTE — Telephone Encounter (Signed)
Pt stopped taking iron supplement three weeks ago because she could not afford.Has been weak since.She is expecting call from pcp today to discuss options

## 2014-01-22 NOTE — Telephone Encounter (Signed)
Pt husband is calling concerned about his wife never having no energy.

## 2014-01-23 NOTE — Telephone Encounter (Signed)
NTBS if no better. She is trying gold bond cream and has noticed an improvement. She will f/u if it worsens.  Advised to dry area thoroughly.

## 2014-01-25 ENCOUNTER — Telehealth: Payer: Self-pay | Admitting: Nurse Practitioner

## 2014-01-25 NOTE — Telephone Encounter (Signed)
appt scheduled for thurs with mmm

## 2014-01-28 ENCOUNTER — Telehealth: Payer: Self-pay | Admitting: Nurse Practitioner

## 2014-01-28 ENCOUNTER — Ambulatory Visit (INDEPENDENT_AMBULATORY_CARE_PROVIDER_SITE_OTHER): Payer: Medicare Other | Admitting: Nurse Practitioner

## 2014-01-28 ENCOUNTER — Encounter: Payer: Self-pay | Admitting: Nurse Practitioner

## 2014-01-28 VITALS — BP 185/81 | HR 79 | Temp 97.9°F | Ht 60.0 in | Wt 193.6 lb

## 2014-01-28 DIAGNOSIS — I2510 Atherosclerotic heart disease of native coronary artery without angina pectoris: Secondary | ICD-10-CM | POA: Diagnosis not present

## 2014-01-28 DIAGNOSIS — B372 Candidiasis of skin and nail: Secondary | ICD-10-CM

## 2014-01-28 MED ORDER — FLUCONAZOLE 150 MG PO TABS: ORAL_TABLET | ORAL | Status: DC

## 2014-01-28 MED ORDER — NYSTATIN 100000 UNIT/GM EX CREA: 1.0000 "application " | TOPICAL_CREAM | Freq: Two times a day (BID) | CUTANEOUS | Status: DC

## 2014-01-28 NOTE — Patient Instructions (Signed)
Cutaneous Candidiasis Cutaneous candidiasis is a condition in which there is an overgrowth of yeast (candida) on the skin. Yeast normally live on the skin, but in small enough numbers not to cause any symptoms. In certain cases, increased growth of the yeast may cause an actual yeast infection. This kind of infection usually occurs in areas of the skin that are constantly warm and moist, such as the armpits or the groin. Yeast is the most common cause of diaper rash in babies and in people who cannot control their bowel movements (incontinence). CAUSES  The fungus that most often causes cutaneous candidiasis is Candida albicans. Conditions that can increase the risk of getting a yeast infection of the skin include:  Obesity.  Pregnancy.  Diabetes.  Taking antibiotic medicine.  Taking birth control pills.  Taking steroid medicines.  Thyroid disease.  An iron or zinc deficiency.  Problems with the immune system. SYMPTOMS   Red, swollen area of the skin.  Bumps on the skin.  Itchiness. DIAGNOSIS  The diagnosis of cutaneous candidiasis is usually based on its appearance. Light scrapings of the skin may also be taken and viewed under a microscope to identify the presence of yeast. TREATMENT  Antifungal creams may be applied to the infected skin. In severe cases, oral medicines may be needed.  HOME CARE INSTRUCTIONS   Keep your skin clean and dry.  Maintain a healthy weight.  If you have diabetes, keep your blood sugar under control. SEEK IMMEDIATE MEDICAL CARE IF:  Your rash continues to spread despite treatment.  You have a fever, chills, or abdominal pain. Document Released: 02/27/2011 Document Revised: 09/03/2011 Document Reviewed: 02/27/2011 ExitCare Patient Information 2015 [employer]. This information is not intended to replace advice given to you by your health care provider. Make sure you discuss any questions you have with your health care provider.  

## 2014-01-28 NOTE — Progress Notes (Signed)
   Subjective:    Patient ID: Sharon Compton, female    DOB: [birth year], 73 y.o.   MRN: [id number]  HPI Patient in c/o rash on breasts not getting better. Has been there for 1  Month- has tried athletes foot cream OTC and that has not helped- Denies scratching but does itch.    Review of Systems  Constitutional: Negative.   HENT: Negative.   Respiratory: Negative.   Cardiovascular: Negative.   Neurological: Negative.   Psychiatric/Behavioral: Negative.   All other systems reviewed and are negative.      Objective:   Physical Exam  Constitutional: She is oriented to person, place, and time. She appears well-developed and well-nourished.  Cardiovascular: Normal rate, regular rhythm and normal heart sounds.   Pulmonary/Chest: Effort normal and breath sounds normal.  Abdominal: Soft. Bowel sounds are normal.  Neurological: She is alert and oriented to person, place, and time.  Skin: Skin is warm and dry.  Erythematous excoriated moist appearing rash bil mammory fold.  Psychiatric: She has a normal mood and affect. Her behavior is normal. Judgment and thought content normal.   BP 185/81  Pulse 79  Temp(Src) 97.9 F (36.6 C) (Oral)  Ht 5' (1.524 m)  Wt 193 lb 9.6 oz (87.816 kg)  BMI 37.81 kg/m2        Assessment & Plan:   1. Cutaneous candidiasis    Meds ordered this encounter  Medications  . nystatin cream (MYCOSTATIN)    Sig: Apply 1 application topically 2 (two) times daily.    Dispense:  30 g    Refill:  0    Let patient know when rx is ready for pick up    Order Specific Question:  Supervising Provider    Answer:  Chipper Herb [[id number]]  . fluconazole (DIFLUCAN) 150 MG tablet    Sig: 1 po qweek x 4 weeks    Dispense:  4 tablet    Refill:  0    Order Specific Question:  Supervising Provider    Answer:  Laurance Flatten, DONALD W [[id number]]   Gold bond powder after nystatin cream dries Keep area as dry as possible Follow up in 1 month as already  scheduled.   Mary-Margaret Hassell Done, FNP

## 2014-01-28 NOTE — Addendum Note (Signed)
Addended by: Chevis Pretty on: 01/28/2014 02:23 PM   Modules accepted: Orders

## 2014-02-04 ENCOUNTER — Telehealth: Payer: Self-pay | Admitting: Nurse Practitioner

## 2014-02-04 NOTE — Telephone Encounter (Signed)
It is yeast- they are nit going to get better unless she can find some way to get air under there to keep moisture away.

## 2014-02-05 NOTE — Telephone Encounter (Signed)
Patient aware.

## 2014-02-09 ENCOUNTER — Other Ambulatory Visit: Payer: Self-pay | Admitting: Nurse Practitioner

## 2014-02-11 NOTE — Telephone Encounter (Signed)
Please call in xanax with 0 refills 

## 2014-02-11 NOTE — Telephone Encounter (Signed)
Called in.

## 2014-02-11 NOTE — Telephone Encounter (Signed)
Patient last seen in office on 01-28-14. Rx last filled on 01-12-14 for #120. Please advise. If approved please route to Pool B so nurse can phone in to pharmacy

## 2014-03-03 ENCOUNTER — Ambulatory Visit: Payer: Medicare Other | Admitting: Nurse Practitioner

## 2014-03-05 ENCOUNTER — Telehealth: Payer: Self-pay | Admitting: Nurse Practitioner

## 2014-03-05 MED ORDER — POLYSACCHARIDE IRON COMPLEX 150 MG PO CAPS: 150.0000 mg | ORAL_CAPSULE | Freq: Every day | ORAL | Status: DC

## 2014-03-05 NOTE — Telephone Encounter (Signed)
I sent the Rx to the pharmacy.

## 2014-03-09 ENCOUNTER — Encounter: Payer: Self-pay | Admitting: Cardiology

## 2014-03-09 ENCOUNTER — Ambulatory Visit (INDEPENDENT_AMBULATORY_CARE_PROVIDER_SITE_OTHER): Payer: Medicare Other | Admitting: Cardiology

## 2014-03-09 VITALS — BP 169/80 | HR 64 | Ht 63.0 in | Wt 191.1 lb

## 2014-03-09 DIAGNOSIS — I4891 Unspecified atrial fibrillation: Secondary | ICD-10-CM

## 2014-03-09 DIAGNOSIS — I509 Heart failure, unspecified: Secondary | ICD-10-CM | POA: Diagnosis not present

## 2014-03-09 DIAGNOSIS — I10 Essential (primary) hypertension: Secondary | ICD-10-CM

## 2014-03-09 DIAGNOSIS — I2510 Atherosclerotic heart disease of native coronary artery without angina pectoris: Secondary | ICD-10-CM

## 2014-03-09 DIAGNOSIS — E039 Hypothyroidism, unspecified: Secondary | ICD-10-CM | POA: Diagnosis not present

## 2014-03-09 DIAGNOSIS — Z8719 Personal history of other diseases of the digestive system: Secondary | ICD-10-CM

## 2014-03-09 NOTE — Progress Notes (Signed)
Clinical Summary Sharon Compton is a 73 y.o.female seen today for follow up of the following medical problems.   1. Afib  - recent admit 10/2013 with Afib with RVR. Soft blood pressures did not tolerate dilt gtt. Digoxin loading did not control her rate. Started on amio gtt with conversion to oral amion with good control  - not on anticoag due to GI bleed from notes, history is unclear.  - denies any palpitations.  - compliant with meds  - blood work by pcp 11/2013, TSH 9.54. In May 2015 TSH was 5.120. Amio was started mid 10/2013.  GI Bleed history  - initial GI bleed 08/2011 on xarelto. Hgb 8.7, transfused 2 units pRBCs. 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 Recs were if bleeding reoccured to consider IR ablation or possible hemicolectomy. Xarelto was stopped for one week then resumed  - from 10/06/11 discharge summary, patient admitted with hemoatchezia after recently having been started on xarelto. She had a prior GI bleed related to cecal AV malformations. Admit Hgb 7, ransfused 4 units pRBCs. Initial colonscopy had poor prep, she refused further testing. Recommendation was not to resume anticoagulation and she has not been on since.   - last visit stopped amio due to elevated TSH. She was on synthroid but states she was unable to take due to feeling shaky.  - denies any palpitations since last visit  2. HTN  - compliant with meds  - does not check regularly at home   3. Chronic diastolic heart failure  - has some DOE walking up stairs, overall ok on level ground  - + LE edema. Mixed compliance with torsemide. Improving with increased compliance with torsemide  4. CAD  - BMS to RCA 02/2013  - echo 09/2012 LVEF 60-65%, no WMAs  - denies any recent chest pain  Past Medical History  Diagnosis Date  . Hypertension   . Anxiety and depression   . Asthma   . Fibromyalgia   . History of pneumonia   . Peripheral  edema   . Peptic ulcer disease   . Hiatal hernia   . GERD (gastroesophageal reflux disease)   . A-fib     cardioversion and TEE at [facility]; pt reports DCCV 2014 at [facility] as well.  . 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 Diverticular disease 10/01/2011  . MI, acute, non ST segment elevation 10/02/2011  . GI bleed     ?recurrent--therefore, no anticoagulant  . COPD (chronic obstructive pulmonary disease)   . Lymphedema   . CAD (coronary artery disease) 02/2013    2.75 x 32 mm Rebel bare metal stent in the distal RCA.      Allergies  Allergen Reactions  . Dye Fdc Red [Red Dye]   . Sulfa Antibiotics Itching  . Codeine Itching and Palpitations     Current Outpatient Prescriptions  Medication Sig Dispense Refill  . ALPRAZolam (XANAX) 1 MG tablet TAKE ONE TABLET BY MOUTH 4 TIMES DAILY AS NEEDED  120 tablet  0  . aspirin EC 81 MG tablet Take 1 tablet (81 mg total) by mouth daily.  30 tablet  0  . clopidogrel (PLAVIX) 75 MG tablet Take 1 tablet (75 mg total) by mouth daily with breakfast.  30 tablet  3  . fluconazole (DIFLUCAN) 150 MG tablet 1 po qweek x 4 weeks  4 tablet  0  . gabapentin (NEURONTIN) 400 MG capsule  TAKE ONE CAPSULE BY MOUTH 4 TIMES DAILY  120 capsule  3  . iron polysaccharides (NIFEREX) 150 MG capsule Take 1 capsule (150 mg total) by mouth daily.  30 capsule  1  . levothyroxine (SYNTHROID, LEVOTHROID) 50 MCG tablet Take 1 tablet (50 mcg total) by mouth daily.  30 tablet  3  . lisinopril (PRINIVIL,ZESTRIL) 40 MG tablet Take 1 tablet (40 mg total) by mouth daily.  90 tablet  1  . metoprolol tartrate (LOPRESSOR) 25 MG tablet Take 25 mg by mouth 2 (two) times daily.      . nitroGLYCERIN (NITROSTAT) 0.4 MG SL tablet Place 1 tablet (0.4 mg total) under the tongue every 5 (five) minutes as needed for chest pain.  30 tablet  0  . nystatin cream (MYCOSTATIN) Apply 1 application topically 2 (two) times daily.  30 g  0  . pantoprazole (PROTONIX) 40 MG tablet Take 1  tablet (40 mg total) by mouth daily.  90 tablet  1  . potassium chloride SA (K-DUR,KLOR-CON) 20 MEQ tablet Take 1 tablet (20 mEq total) by mouth daily.  30 tablet  3  . torsemide (DEMADEX) 20 MG tablet Take 20 mg by mouth 2 (two) times daily.      [name] zolpidem (AMBIEN) 5 MG tablet Take 1 tablet (5 mg total) by mouth at bedtime as needed for sleep.  30 tablet  1   No current facility-administered medications for this visit.     Past Surgical History  Procedure Laterality Date  . Abdominal hysterectomy  age 39    nonmalignant reason  . Cholecystectomy    . Abdominal exploration surgery    . Abd tumor removed      states was 10 lbs, benign  . Knee surgery    . Bladder stent    . Esophagogastroduodenoscopy  08/24/2011    Procedure: ESOPHAGOGASTRODUODENOSCOPY (EGD);  Surgeon: Daneil Dolin, MD;  Location: [facility];  Service: Endoscopy;  Laterality: N/A;  give phenergan 12.5mg  iv 30 mins prior to procedure  . Colonoscopy  08/25/2011    Procedure: COLONOSCOPY;  Surgeon: Daneil Dolin, MD;  Location: [facility];  Service: Endoscopy;  Laterality: N/A;  . Colonoscopy  10/03/2011    Procedure: COLONOSCOPY;  Surgeon: Daneil Dolin, MD;  Location: [facility];  Service: Endoscopy;  Laterality: N/A;  NEEDS PHENERGAN 25 MG IV ON CALL  . Colonoscopy  10/04/2011    Procedure: COLONOSCOPY;  Surgeon: Daneil Dolin, MD;  Location: [facility];  Service: Endoscopy;  Laterality: N/A;  Phenergan 12.5 mg ON CALL  . Transthoracic echocardiogram  09/2011    EF 60-65%, septal hypokinesia  . Cardiovascular stress test  09/2011    equivocal result, most likely low risk; pt refused the recommended cardiac cath to follow this up.  . Coronary angioplasty  02/2013  . Percutaneous coronary stent intervention (pci-s)  02/2013    2.75 x 32 mm Rebel bare metal stent in the distal RCA.      Allergies  Allergen Reactions  . Dye Fdc Red [Red Dye]   . Sulfa Antibiotics Itching  . Codeine Itching and Palpitations       Family History  Problem Relation Age of Onset  . Diabetes Mother     Deceased  . Hypertension Mother   . Cancer Father     Deceased  . Colon cancer Neg Hx   . Coronary artery disease Mother   . Heart failure Mother  Social History Ms. Hansmann reports that she quit smoking about 18 years ago. Her smoking use included Cigarettes. She has a 15 pack-year smoking history. She has never used smokeless tobacco. Ms. Pommier reports that she does not drink alcohol.   Review of Systems CONSTITUTIONAL: + generalized fatigue HEENT: Eyes: No visual loss, blurred vision, double vision or yellow sclerae.No hearing loss, sneezing, congestion, runny nose or sore throat.  SKIN: No rash or itching.  CARDIOVASCULAR: per HPI RESPIRATORY: No shortness of breath, cough or sputum.  GASTROINTESTINAL: No anorexia, nausea, vomiting or diarrhea. No abdominal pain or blood.  GENITOURINARY: No burning on urination, no polyuria NEUROLOGICAL: No headache, dizziness, syncope, paralysis, ataxia, numbness or tingling in the extremities. No change in bowel or bladder control.  MUSCULOSKELETAL: + leg swelling  LYMPHATICS: No enlarged nodes. No history of splenectomy.  PSYCHIATRIC: No history of depression or anxiety.  ENDOCRINOLOGIC: No reports of sweating, cold or heat intolerance. No polyuria or polydipsia.  [name]   Physical Examination p 66 bp 175/82 Wt 191 lbs BMI 34 Gen: resting comfortably, no acute distress HEENT: no scleral icterus, pupils equal round and reactive, no palptable cervical adenopathy,  CV: RRR, no m/r/g, no JVD, no carotid bruits Resp: Clear to auscultation bilaterally GI: abdomen is soft, non-tender, non-distended, normal bowel sounds, no hepatosplenomegaly MSK: extremities are warm, 1+ bilateral edema Skin: warm, no rash Neuro:  no focal deficits Psych: appropriate affect   Diagnostic Studies 1. Cardiac Cath 02/2013  Hemodynamic Findings:  Ao: 187/87  LV: 178/15/26  RA:  10  RV: 40/11/15  PA: 35/20 (mean 27)  PCWP: 22  Fick Cardiac Output: 5.91 L/min  Fick Cardiac Index: 3.13 L/min/m2  Central Aortic Saturation: 88%  Pulmonary Artery Saturation: 65%  Angiographic Findings:  Left main: No obstructive disease.  Left Anterior Descending Artery: Large caliber vessel that courses to the apex. The mid vessel has a 30% stenosis. The first diagonal branch has mild plaque. The second diagonal branch is small to moderate in caliber with 50% ostial stenosis.  Circumflex Artery: Moderate caliber vessel with no obstructive disease.  Right Coronary Artery: Large dominant vessel with serial 30% proximal and mid stenoses. The distal vessel has a 99% stenosis followed by a 80% stenosis just before the bifurcation. The Posterolateral branch is the larger of the two distal branches and has mild diffuse plaque. The PDA is small in caliber with 40% ostial stenosis.  Left Ventricular Angiogram:LVEF=65-70%  PCI Note: The sheath was upsized to a 6 [place] system. She was given a bolus of Angiomax and a drip was started. She was given Plavix 600 mg po x 1. I then engaged the RCA with a JR4 guide. When the ACT was over 200, I passed a BMW wire down the RCA. I then used a 2.5 x 15 [place] balloon to pre-dilate the stenosis. I then deployed a 2.75 x 32 mm Rebel bare metal stent in the distal RCA. I post-dilated the distal stented segment with a Euphora 2.75 x 12 mm [place] balloon x 2. I then post-dilated the proximal stented segment with a 3.0 x 15 mm Euphora  balloon x 2. There was an excellent result. The small PDA was jailed by the stent but there was excellent flow down this branch.   09/2012 Echo  LVEF 60-65%, no WMAs, grade I diastolic dysfunction     Assessment and Plan  1. Afib  - elevated TSH on amio,  discontinued and continued metoprolol. - denies any recent symptoms - review  of GI bleeding history as detailed above, 2 episodes of severe bleeding requiring transfusion when  previously on xarelto thought to be related to cecal AVMs. Will not restart anticoag, continue ASA   2. HTN  - elevated in clinic, she has had mixed compliance with her diuretic and does have some evidence of volume overload  - follow bp with increased diuretic compliance   3. Acute on Chronic diastolic heart failure  - mixed compliance with torsemide, has fairly significant LE edema. Counseled on compliance with meds   4. CAD  - BMS to RCA 02/2013, she has completed a year of DAPT - stop plavix  5. Hypothyroidism - recheck TSH, free T4, T3 - she is off amio        Arnoldo Lenis, M.D., F.A.C.C.

## 2014-03-09 NOTE — Patient Instructions (Addendum)
   Stop Plavix.  Refill for Protonix sent to pharmacy. Continue all other medications.   Labs for TSH, CBC,BMET, T3, Free T4, MAGNESIUM to be done. Office will contact with results via phone or letter.   Your physician wants you to follow up in:  3 months.  You will receive a reminder letter in the mail one-two months in advance.  If you don't receive a letter, please call our office to schedule the follow up appointment.

## 2014-03-11 ENCOUNTER — Other Ambulatory Visit: Payer: Self-pay | Admitting: Nurse Practitioner

## 2014-03-11 ENCOUNTER — Telehealth: Payer: Self-pay | Admitting: Nurse Practitioner

## 2014-03-11 NOTE — Telephone Encounter (Signed)
Appt scheduled for 03/19/14. Patient aware.

## 2014-03-12 NOTE — Telephone Encounter (Signed)
Patient last seen in office on 01-28-14. Rx last filled on 02-11-14. Please advise on refill.  If approved please route to pool B so nurse can phone in to pharmacy

## 2014-03-14 NOTE — Telephone Encounter (Signed)
Please call in xanax with 1 refills 

## 2014-03-15 NOTE — Telephone Encounter (Signed)
Left authorization on voicemail 

## 2014-03-19 ENCOUNTER — Encounter: Payer: Self-pay | Admitting: Nurse Practitioner

## 2014-03-19 ENCOUNTER — Ambulatory Visit (INDEPENDENT_AMBULATORY_CARE_PROVIDER_SITE_OTHER): Payer: Medicare Other | Admitting: Nurse Practitioner

## 2014-03-19 VITALS — BP 191/88 | HR 87 | Temp 98.0°F | Ht 63.0 in | Wt 188.0 lb

## 2014-03-19 DIAGNOSIS — I10 Essential (primary) hypertension: Secondary | ICD-10-CM

## 2014-03-19 DIAGNOSIS — R609 Edema, unspecified: Secondary | ICD-10-CM

## 2014-03-19 DIAGNOSIS — F332 Major depressive disorder, recurrent severe without psychotic features: Secondary | ICD-10-CM

## 2014-03-19 DIAGNOSIS — R739 Hyperglycemia, unspecified: Secondary | ICD-10-CM

## 2014-03-19 DIAGNOSIS — F411 Generalized anxiety disorder: Secondary | ICD-10-CM

## 2014-03-19 DIAGNOSIS — R5381 Other malaise: Secondary | ICD-10-CM

## 2014-03-19 DIAGNOSIS — E876 Hypokalemia: Secondary | ICD-10-CM

## 2014-03-19 DIAGNOSIS — G4700 Insomnia, unspecified: Secondary | ICD-10-CM | POA: Diagnosis not present

## 2014-03-19 DIAGNOSIS — I2510 Atherosclerotic heart disease of native coronary artery without angina pectoris: Secondary | ICD-10-CM | POA: Diagnosis not present

## 2014-03-19 DIAGNOSIS — R5383 Other fatigue: Secondary | ICD-10-CM

## 2014-03-19 DIAGNOSIS — R600 Localized edema: Secondary | ICD-10-CM

## 2014-03-19 DIAGNOSIS — D509 Iron deficiency anemia, unspecified: Secondary | ICD-10-CM

## 2014-03-19 DIAGNOSIS — E669 Obesity, unspecified: Secondary | ICD-10-CM

## 2014-03-19 DIAGNOSIS — R635 Abnormal weight gain: Secondary | ICD-10-CM | POA: Diagnosis not present

## 2014-03-19 DIAGNOSIS — R7309 Other abnormal glucose: Secondary | ICD-10-CM

## 2014-03-19 MED ORDER — ZOLPIDEM TARTRATE 5 MG PO TABS: 5.0000 mg | ORAL_TABLET | Freq: Every evening | ORAL | Status: DC | PRN

## 2014-03-19 MED ORDER — LISINOPRIL 40 MG PO TABS: 40.0000 mg | ORAL_TABLET | Freq: Every day | ORAL | Status: DC

## 2014-03-19 NOTE — Patient Instructions (Signed)

## 2014-03-19 NOTE — Progress Notes (Signed)
Subjective:    Patient ID: Sharon Compton, female    DOB: July 27, 1940, 73 y.o.   MRN: [id number]  HPI Patient in today for follow up of chronic medical problems. She has a history of anemia and was given iron supplements and they caused her to have a headache so she had to stop taking-  SHe is c/o constant fatigue.  Patient Active Problem List   Diagnosis Date Noted  . Atrial fibrillation with RVR 11/03/2013  . Insomnia 07/16/2013  . Major depressive disorder, recurrent episode, severe, without mention of psychotic behavior 06/14/2013  . Posttraumatic stress disorder 06/14/2013  . Acute on chronic diastolic CHF (congestive heart failure), NYHA class 3 03/05/2013  . CAD (coronary artery disease), native coronary artery 03/05/2013  . Angina, class III 03/05/2013  . Hypokalemia 03/05/2013  . HTN (hypertension), benign 09/27/2012  . Hyperglycemia 10/03/2011  . Asthma 10/01/2011  . [place]ath-induced atrial fibrillation 10/01/2011  . 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 Diverticulosis 10/01/2011  . Peripheral edema 08/23/2011  . Elevated LFTs 08/22/2011  . Anxiety disorder 08/21/2011  . GI bleed 08/20/2011  . Obesity 08/20/2011   Outpatient Encounter Prescriptions as of 03/19/2014  Medication Sig  . ALPRAZolam (XANAX) 1 MG tablet TAKE ONE TABLET BY MOUTH 4 TIMES DAILY AS NEEDED  . aspirin EC 81 MG tablet Take 1 tablet (81 mg total) by mouth daily.  [name] gabapentin (NEURONTIN) 400 MG capsule TAKE ONE CAPSULE BY MOUTH 4 TIMES DAILY  . iron polysaccharides (NIFEREX) 150 MG capsule Take 1 capsule (150 mg total) by mouth daily.  [name] levothyroxine (SYNTHROID, LEVOTHROID) 50 MCG tablet Take 50 mcg by mouth daily before breakfast.  . lisinopril (PRINIVIL,ZESTRIL) 40 MG tablet Take 1 tablet (40 mg total) by mouth daily.  . metoprolol tartrate (LOPRESSOR) 25 MG tablet Take 25 mg by mouth 2 (two) times daily.  . nitroGLYCERIN (NITROSTAT) 0.4 MG SL tablet Place 1 tablet (0.4 mg total)  under the tongue every 5 (five) minutes as needed for chest pain.  [name] nystatin cream (MYCOSTATIN) Apply 1 application topically 2 (two) times daily.  . pantoprazole (PROTONIX) 40 MG tablet Take 1 tablet (40 mg total) by mouth daily.  . potassium chloride SA (K-DUR,KLOR-CON) 20 MEQ tablet Take 1 tablet (20 mEq total) by mouth daily.  [name] torsemide (DEMADEX) 20 MG tablet Take 20 mg by mouth 2 (two) times daily.       Review of Systems  Constitutional: Positive for fatigue.  HENT: Negative.   Respiratory: Negative.   Cardiovascular: Negative.   Genitourinary: Negative.   Neurological: Negative.   Psychiatric/Behavioral: Negative.   All other systems reviewed and are negative.      Objective:   Physical Exam  Constitutional: She is oriented to person, place, and time. She appears well-developed and well-nourished.  HENT:  Nose: Nose normal.  Mouth/Throat: Oropharynx is clear and moist.  Eyes: EOM are normal.  Neck: Trachea normal, normal range of motion and full passive range of motion without pain. Neck supple. No JVD present. Carotid bruit is not present. No thyromegaly present.  Cardiovascular: Normal rate, regular rhythm, normal heart sounds and intact distal pulses.  Exam reveals no gallop and no friction rub.   No murmur heard. Pulmonary/Chest: Effort normal and breath sounds normal.  Abdominal: Soft. Bowel sounds are normal. She exhibits no distension and no mass. There is no tenderness.  Musculoskeletal: Normal range of motion. She exhibits edema (2+ edema bil LE).  Lymphadenopathy:    She  has no cervical adenopathy.  Neurological: She is alert and oriented to person, place, and time. She has normal reflexes.  Skin: Skin is warm and dry.  Psychiatric: She has a normal mood and affect. Her behavior is normal. Judgment and thought content normal.   BP 191/88  Pulse 87  Temp(Src) 98 F (36.7 C) (Oral)  Ht _0  (1.6 m)  Wt 188 lb (85.276 kg)  BMI 33.31 kg/m2          Assessment & Plan:  1. Peripheral edema Elevate legs when sitting  2. Obesity Discussed diet and exercise for person with BMI >25 Sharon recheck weight in 3-6 months   3. Major depressive disorder, recurrent episode, severe, without mention of psychotic behavior Stress management  4. Insomnia Bedtime ritual - zolpidem (AMBIEN) 5 MG tablet; Take 1 tablet (5 mg total) by mouth at bedtime as needed for sleep.  Dispense: 30 tablet; Refill: 1  5. Hypokalemia  6. Hyperglycemia  7. HTN (hypertension), benign Low NA+ diet - CMP14+EGFR - NMR, lipoprofile - lisinopril (PRINIVIL,ZESTRIL) 40 MG tablet; Take 1 tablet (40 mg total) by mouth daily.  Dispense: 90 tablet; Refill: 1  8. Coronary artery disease involving native coronary artery of native heart without angina pectoris Keep follow up with cardiologist  9. Generalized anxiety disorder  10. Iron deficiency anemia - Anemia Profile B  11. Other fatigue - Thyroid Panel With TSH    Labs pending Health maintenance reviewed Diet and exercise encouraged Continue all meds Follow up  In 3 months   [place], FNP

## 2014-03-20 LAB — CMP14+EGFR
A/G RATIO: 1.7 (ref 1.1–2.5)
ALBUMIN: 4.4 g/dL (ref 3.5–4.8)
ALT: 14 IU/L (ref 0–32)
AST: 18 IU/L (ref 0–40)
Alkaline Phosphatase: 84 IU/L (ref 39–117)
BILIRUBIN TOTAL: 0.4 mg/dL (ref 0.0–1.2)
BUN/Creatinine Ratio: 11 (ref 11–26)
BUN: 13 mg/dL (ref 8–27)
CALCIUM: 9.6 mg/dL (ref 8.7–10.3)
CO2: 26 mmol/L (ref 18–29)
CREATININE: 1.18 mg/dL — AB (ref 0.57–1.00)
Chloride: 98 mmol/L (ref 97–108)
GFR, EST AFRICAN AMERICAN: 53 mL/min/{1.73_m2} — AB (ref >59)
GFR, EST NON AFRICAN AMERICAN: 46 mL/min/{1.73_m2} — AB (ref >59)
GLOBULIN, TOTAL: 2.6 g/dL (ref 1.5–4.5)
GLUCOSE: 115 mg/dL — AB (ref 65–99)
Potassium: 3.8 mmol/L (ref 3.5–5.2)
Sodium: 142 mmol/L (ref 134–144)
TOTAL PROTEIN: 7 g/dL (ref 6.0–8.5)

## 2014-03-20 LAB — NMR, LIPOPROFILE
CHOLESTEROL: 300 mg/dL — AB (ref 100–199)
HDL CHOLESTEROL BY NMR: 44 mg/dL (ref >39)
HDL Particle Number: 24.4 umol/L — ABNORMAL LOW (ref >=30.5)
LDL Particle Number: 2572 nmol/L — ABNORMAL HIGH (ref <1000)
LDL Size: 20.7 nm (ref >20.5)
LDLC SERPL CALC-MCNC: 215 mg/dL — ABNORMAL HIGH (ref 0–99)
LP-IR Score: 67 — ABNORMAL HIGH (ref <=45)
SMALL LDL PARTICLE NUMBER: 958 nmol/L — AB (ref <=527)
TRIGLYCERIDES BY NMR: 205 mg/dL — AB (ref 0–149)

## 2014-03-20 LAB — ANEMIA PROFILE B
BASOS: 1 %
Basophils Absolute: 0 10*3/uL (ref 0.0–0.2)
Eos: 1 %
Eosinophils Absolute: 0.1 10*3/uL (ref 0.0–0.4)
Ferritin: 28 ng/mL (ref 15–150)
HCT: 40.1 % (ref 34.0–46.6)
HEMOGLOBIN: 13.6 g/dL (ref 11.1–15.9)
IRON SATURATION: 23 % (ref 15–55)
IRON: 88 ug/dL (ref 35–155)
Immature Grans (Abs): 0 10*3/uL (ref 0.0–0.1)
Immature Granulocytes: 0 %
LYMPHS ABS: 2.5 10*3/uL (ref 0.7–3.1)
LYMPHS: 37 %
MCH: 27.8 pg (ref 26.6–33.0)
MCHC: 33.9 g/dL (ref 31.5–35.7)
MCV: 82 fL (ref 79–97)
MONOCYTES: 6 %
Monocytes Absolute: 0.4 10*3/uL (ref 0.1–0.9)
Neutrophils Absolute: 3.8 10*3/uL (ref 1.4–7.0)
Neutrophils Relative %: 55 %
Platelets: 177 10*3/uL (ref 150–379)
RBC: 4.89 x10E6/uL (ref 3.77–5.28)
RDW: 16 % — AB (ref 12.3–15.4)
RETIC CT PCT: 1.6 % (ref 0.6–2.6)
TIBC: 377 ug/dL (ref 250–450)
UIBC: 289 ug/dL (ref 150–375)
Vitamin B-12: 596 pg/mL (ref 211–946)
WBC: 6.7 10*3/uL (ref 3.4–10.8)

## 2014-03-20 LAB — T3, FREE: T3, Free: 2.6 pg/mL (ref 2.0–4.4)

## 2014-03-20 LAB — THYROID PANEL WITH TSH
Free Thyroxine Index: 1.7 (ref 1.2–4.9)
T3 Uptake Ratio: 26 % (ref 24–39)
T4, Total: 6.7 ug/dL (ref 4.5–12.0)
TSH: 6.36 u[IU]/mL — ABNORMAL HIGH (ref 0.450–4.500)

## 2014-03-20 LAB — T4, FREE: Free T4: 1.08 ng/dL (ref 0.82–1.77)

## 2014-03-20 LAB — MAGNESIUM: MAGNESIUM: 2.1 mg/dL (ref 1.6–2.6)

## 2014-03-22 ENCOUNTER — Telehealth: Payer: Self-pay | Admitting: Nurse Practitioner

## 2014-03-22 NOTE — Telephone Encounter (Signed)
Wants to try a different med for thyroid.

## 2014-03-23 MED ORDER — SYNTHROID 50 MCG PO TABS: 50.0000 ug | ORAL_TABLET | Freq: Every day | ORAL | Status: DC

## 2014-03-23 NOTE — Telephone Encounter (Signed)
Synthroid sent to pharmacy.

## 2014-03-23 NOTE — Telephone Encounter (Signed)
Patient aware.

## 2014-03-24 ENCOUNTER — Other Ambulatory Visit: Payer: Self-pay | Admitting: Nurse Practitioner

## 2014-03-24 MED ORDER — SYNTHROID 50 MCG PO TABS: ORAL_TABLET | ORAL | Status: DC

## 2014-04-13 ENCOUNTER — Ambulatory Visit: Payer: Medicare Other

## 2014-04-26 ENCOUNTER — Encounter: Payer: Self-pay | Admitting: Nurse Practitioner

## 2014-05-12 ENCOUNTER — Other Ambulatory Visit: Payer: Self-pay | Admitting: Nurse Practitioner

## 2014-05-12 NOTE — Telephone Encounter (Signed)
rx called into pharmacy

## 2014-05-12 NOTE — Telephone Encounter (Signed)
Please call in xanax with 1 refills 

## 2014-05-12 NOTE — Telephone Encounter (Signed)
Last sen 03/19/14  MMM If approved route to nurse to call into [facility]

## 2014-06-03 ENCOUNTER — Encounter (HOSPITAL_COMMUNITY): Payer: Self-pay | Admitting: Cardiovascular Disease

## 2014-06-08 ENCOUNTER — Ambulatory Visit: Payer: Self-pay | Admitting: Cardiology

## 2014-06-14 ENCOUNTER — Other Ambulatory Visit: Payer: Self-pay | Admitting: Nurse Practitioner

## 2014-06-14 NOTE — Telephone Encounter (Signed)
ntbs

## 2014-06-15 ENCOUNTER — Ambulatory Visit (INDEPENDENT_AMBULATORY_CARE_PROVIDER_SITE_OTHER): Payer: Medicare Other | Admitting: Nurse Practitioner

## 2014-06-15 ENCOUNTER — Encounter: Payer: Self-pay | Admitting: Nurse Practitioner

## 2014-06-15 VITALS — BP 147/96 | HR 90 | Temp 98.0°F | Ht 63.0 in | Wt 191.0 lb

## 2014-06-15 DIAGNOSIS — J209 Acute bronchitis, unspecified: Secondary | ICD-10-CM

## 2014-06-15 DIAGNOSIS — J069 Acute upper respiratory infection, unspecified: Secondary | ICD-10-CM | POA: Diagnosis not present

## 2014-06-15 DIAGNOSIS — I2510 Atherosclerotic heart disease of native coronary artery without angina pectoris: Secondary | ICD-10-CM | POA: Diagnosis not present

## 2014-06-15 MED ORDER — AMOXICILLIN 875 MG PO TABS: 875.0000 mg | ORAL_TABLET | Freq: Two times a day (BID) | ORAL | Status: DC

## 2014-06-15 MED ORDER — BENZONATATE 100 MG PO CAPS: 100.0000 mg | ORAL_CAPSULE | Freq: Two times a day (BID) | ORAL | Status: DC | PRN

## 2014-06-15 NOTE — Patient Instructions (Signed)

## 2014-06-15 NOTE — Telephone Encounter (Signed)
Appt scheduled for 06-15-14.

## 2014-06-15 NOTE — Progress Notes (Signed)
Subjective:    Patient ID: Sharon Compton, female    DOB: Jan 07, 1941, 73 y.o.   MRN: [id number]  HPI Patient in c/o cough and congestion. Started last Friday night. Spitting up greenish phlegm.    Review of Systems  Constitutional: Negative.  Negative for fever.  HENT: Positive for postnasal drip. Negative for congestion.   Respiratory: Positive for cough.   Cardiovascular: Negative.   Gastrointestinal: Negative.   Genitourinary: Negative.   Neurological: Negative.   Psychiatric/Behavioral: Negative.   All other systems reviewed and are negative.      Objective:   Physical Exam  Constitutional: She is oriented to person, place, and time. She appears well-developed and well-nourished. No distress.  HENT:  Right Ear: Hearing, tympanic membrane, external ear and ear canal normal.  Left Ear: Hearing, tympanic membrane, external ear and ear canal normal.  Nose: Mucosal edema and rhinorrhea present. Right sinus exhibits no maxillary sinus tenderness and no frontal sinus tenderness. Left sinus exhibits no maxillary sinus tenderness and no frontal sinus tenderness.  Mouth/Throat: Uvula is midline, oropharynx is clear and moist and mucous membranes are normal.  Eyes: Pupils are equal, round, and reactive to light.  Neck: Normal range of motion. Neck supple.  Cardiovascular: Normal rate, regular rhythm and normal heart sounds.   Pulmonary/Chest: Effort normal and breath sounds normal.  Rhonchi in lower lobes- productive wet cough  Lymphadenopathy:    She has no cervical adenopathy.  Neurological: She is alert and oriented to person, place, and time.  Skin: Skin is warm and dry.  Psychiatric: She has a normal mood and affect. Her behavior is normal. Judgment and thought content normal.   BP 147/96 mmHg  Pulse 90  Temp(Src) 98 F (36.7 C) (Oral)  Ht 5\' 3"  (1.6 m)  Wt 191 lb (86.637 kg)  BMI 33.84 kg/m2        Assessment & Plan:   1. Upper respiratory infection with cough  and congestion   2. Acute bronchitis, unspecified organism    Meds ordered this encounter  Medications  . iron polysaccharides (NIFEREX) 150 MG capsule    Sig: Take 150 mg by mouth daily.  [name] amoxicillin (AMOXIL) 875 MG tablet    Sig: Take 1 tablet (875 mg total) by mouth 2 (two) times daily.    Dispense:  20 tablet    Refill:  0    Order Specific Question:  Supervising Provider    Answer:  Chipper Herb [[id number]]  . benzonatate (TESSALON) 100 MG capsule    Sig: Take 1 capsule (100 mg total) by mouth 2 (two) times daily as needed for cough.    Dispense:  20 capsule    Refill:  0    Order Specific Question:  Supervising Provider    Answer:  Chipper Herb [[id number]]   1. Take meds as prescribed 2. Use a cool mist humidifier especially during the winter months and when heat has been humid. 3. Use saline nose sprays frequently 4. Saline irrigations of the nose can be very helpful if done frequently.  * 4X daily for 1 week*  * Use of a nettie pot can be helpful with this. Follow directions with this* 5. Drink plenty of fluids 6. Keep thermostat turn down low 7.For any cough or congestion  Use plain Mucinex- regular strength or max strength is fine- NO DECONGESTANTS!   * Children- consult with Pharmacist for dosing 8. For fever or aces or pains- take tylenol or ibuprofen appropriate  for age and weight.  * for fevers greater than 101 orally you may alternate ibuprofen and tylenol every  3 hours.   Mary-Margaret Hassell Done, FNP

## 2014-06-16 ENCOUNTER — Telehealth: Payer: Self-pay | Admitting: Nurse Practitioner

## 2014-06-21 ENCOUNTER — Telehealth: Payer: Self-pay | Admitting: Nurse Practitioner

## 2014-06-21 NOTE — Telephone Encounter (Signed)
Patient took one amoxicillin and felt like she was in a-fib so she won't take another one.  Her husband says she wants another antibiotic without penicillin.   She still has flu-like symptoms.  Advised it could be viral and  OTC medications may do as well as antibiotics. Wanted you to advise.

## 2014-06-21 NOTE — Telephone Encounter (Signed)
probalably viral- just treat symptoms with OTC meds- mucinex for cough- motirn or tylneol if fever.

## 2014-06-21 NOTE — Telephone Encounter (Signed)
Patient aware to take OTC medications . If she gets worse call us to be seen .

## 2014-07-08 ENCOUNTER — Ambulatory Visit (INDEPENDENT_AMBULATORY_CARE_PROVIDER_SITE_OTHER): Payer: Medicare Other | Admitting: Family Medicine

## 2014-07-08 ENCOUNTER — Telehealth: Payer: Self-pay | Admitting: Nurse Practitioner

## 2014-07-08 VITALS — BP 203/99 | HR 88 | Temp 97.4°F | Ht 63.0 in | Wt 192.0 lb

## 2014-07-08 DIAGNOSIS — I10 Essential (primary) hypertension: Secondary | ICD-10-CM

## 2014-07-08 DIAGNOSIS — R1030 Lower abdominal pain, unspecified: Secondary | ICD-10-CM | POA: Diagnosis not present

## 2014-07-08 DIAGNOSIS — I4891 Unspecified atrial fibrillation: Secondary | ICD-10-CM | POA: Diagnosis not present

## 2014-07-08 LAB — POCT CBC
Granulocyte percent: 48.3 %G (ref 37–80)
HCT, POC: 39.8 % (ref 37.7–47.9)
Hemoglobin: 12.3 g/dL (ref 12.2–16.2)
Lymph, poc: 2.8 (ref 0.6–3.4)
MCH, POC: 25.3 pg — AB (ref 27–31.2)
MCHC: 31 g/dL — AB (ref 31.8–35.4)
MCV: 81.6 fL (ref 80–97)
MPV: 7.7 fL (ref 0–99.8)
POC Granulocyte: 2.8 (ref 2–6.9)
POC LYMPH PERCENT: 47.7 %L (ref 10–50)
Platelet Count, POC: 231 10*3/uL (ref 142–424)
RBC: 4.9 M/uL (ref 4.04–5.48)
RDW, POC: 15.5 %
WBC: 5.9 10*3/uL (ref 4.6–10.2)

## 2014-07-08 MED ORDER — ALPRAZOLAM 1 MG PO TABS: 1.0000 mg | ORAL_TABLET | Freq: Four times a day (QID) | ORAL | Status: DC

## 2014-07-08 MED ORDER — METOPROLOL TARTRATE 25 MG PO TABS: 25.0000 mg | ORAL_TABLET | Freq: Two times a day (BID) | ORAL | Status: DC

## 2014-07-08 MED ORDER — LISINOPRIL 40 MG PO TABS: 40.0000 mg | ORAL_TABLET | Freq: Every day | ORAL | Status: DC

## 2014-07-08 MED ORDER — OXYCODONE-ACETAMINOPHEN 5-325 MG PO TABS: 1.0000 | ORAL_TABLET | Freq: Three times a day (TID) | ORAL | Status: DC | PRN

## 2014-07-08 NOTE — Progress Notes (Signed)
   Subjective:    Patient ID: Sharon Compton, female    DOB: [birth year], 74 y.o.   MRN: [id number]  HPI Patient c/o knot in stomach and c/o severe abdominal pain.  She is c/o sob. She has hx of atrial fibrillation.  She has hx of CAD. She has hx of cholecystectomy, Hysterectomy and Exp Lap surgeries.  She states the pain is worse when she stands and improves when she lies flat.    Review of Systems  Constitutional: Negative for fever.  HENT: Negative for ear pain.   Eyes: Negative for discharge.  Respiratory: Negative for cough.   Cardiovascular: Negative for chest pain.  Gastrointestinal: Negative for abdominal distention.  Endocrine: Negative for polyuria.  Genitourinary: Negative for difficulty urinating.  Musculoskeletal: Negative for gait problem and neck pain.  Skin: Negative for color change and rash.  Neurological: Negative for speech difficulty and headaches.  Psychiatric/Behavioral: Negative for agitation.       Objective:    BP 203/99 mmHg  Pulse 88  Temp(Src) 97.4 F (36.3 C) (Oral)  Ht 5\' 3"  (1.6 m)  Wt 192 lb (87.091 kg)  BMI 34.02 kg/m2  Repeat BP 170/100 right arm oxygen sat - 96% on RA Physical Exam  Constitutional: She is oriented to person, place, and time. She appears well-developed and well-nourished.  HENT:  Head: Normocephalic and atraumatic.  Mouth/Throat: Oropharynx is clear and moist.  Eyes: Pupils are equal, round, and reactive to light.  Neck: Normal range of motion. Neck supple.  Cardiovascular: Normal rate and regular rhythm.   No murmur heard. Pulmonary/Chest: Effort normal and breath sounds normal.  Abdominal: Soft. Bowel sounds are normal. There is tenderness. There is guarding.  Mass lower abdomen with tenderness.  Neurological: She is alert and oriented to person, place, and time.  Skin: Skin is warm and dry.  Psychiatric: She has a normal mood and affect.   Results for orders placed or performed in visit on 07/08/14  POCT CBC    Result Value Ref Range   WBC 5.9 4.6 - 10.2 K/uL   Lymph, poc 2.8 0.6 - 3.4   POC LYMPH PERCENT 47.7 10 - 50 %L   POC Granulocyte 2.8 2 - 6.9   Granulocyte percent 48.3 37 - 80 %G   RBC 4.9 4.04 - 5.48 M/uL   Hemoglobin 12.3 12.2 - 16.2 g/dL   HCT, POC 39.8 37.7 - 47.9 %   MCV 81.6 80 - 97 fL   MCH, POC 25.3 (A) 27 - 31.2 pg   MCHC 31.0 (A) 31.8 - 35.4 g/dL   RDW, POC 15.5 %   Platelet Count, POC 231.0 142 - 424 K/uL   MPV 7.7 0 - 99.8 fL   EKG - NSR without acute ST-T changes Lysbeth Penner FNP      Assessment & Plan:     ICD-9-CM ICD-10-CM   1. Lower abdominal pain 789.09 R10.30 POCT CBC     CT Abdomen Pelvis Wo Contrast     oxyCODONE-acetaminophen (ROXICET) 5-325 MG per tablet  2. Atrial fibrillation with RVR 427.31 I48.91 EKG 12-Lead     CT Abdomen Pelvis Wo Contrast     oxyCODONE-acetaminophen (ROXICET) 5-325 MG per tablet  3. HTN (hypertension), benign 401.1 I10 ALPRAZolam (XANAX) 1 MG tablet     lisinopril (PRINIVIL,ZESTRIL) 40 MG tablet     metoprolol tartrate (LOPRESSOR) 25 MG tablet     No Follow-up on file.  Lysbeth Penner FNP

## 2014-07-08 NOTE — Telephone Encounter (Signed)
Pt given appt with Dietrich Pates this afternoon at 3:15.

## 2014-07-09 ENCOUNTER — Other Ambulatory Visit: Payer: Self-pay | Admitting: Family Medicine

## 2014-07-09 ENCOUNTER — Ambulatory Visit (HOSPITAL_COMMUNITY)
Admission: RE | Admit: 2014-07-09 | Discharge: 2014-07-09 | Disposition: A | Discharge status: Home / Self Care | Payer: Medicare Other | Source: Ambulatory Visit | Attending: Family Medicine | Admitting: Family Medicine

## 2014-07-09 DIAGNOSIS — R1030 Lower abdominal pain, unspecified: Secondary | ICD-10-CM | POA: Diagnosis not present

## 2014-07-09 DIAGNOSIS — K439 Ventral hernia without obstruction or gangrene: Secondary | ICD-10-CM | POA: Diagnosis not present

## 2014-07-09 DIAGNOSIS — K5730 Diverticulosis of large intestine without perforation or abscess without bleeding: Secondary | ICD-10-CM | POA: Insufficient documentation

## 2014-07-09 DIAGNOSIS — I4891 Unspecified atrial fibrillation: Secondary | ICD-10-CM

## 2014-07-12 ENCOUNTER — Telehealth: Payer: Self-pay | Admitting: Family Medicine

## 2014-07-12 NOTE — Telephone Encounter (Signed)
Patient wanted to know if [facility]mark had reviewed her CT scan yet and I advised patient that he had and what his recommendations were. Patient had forgot that we had called her on the 15th with the results and recommendations. Patient aware we are working on referral.

## 2014-07-14 ENCOUNTER — Telehealth: Payer: Self-pay | Admitting: Nurse Practitioner

## 2014-07-14 NOTE — Telephone Encounter (Signed)
Pt needed number to [facility] Number given to pt

## 2014-07-21 ENCOUNTER — Other Ambulatory Visit: Payer: Self-pay | Admitting: Nurse Practitioner

## 2014-07-23 ENCOUNTER — Telehealth: Payer: Self-pay | Admitting: Nurse Practitioner

## 2014-07-23 NOTE — Telephone Encounter (Signed)
Pt notified [employer] Verbalizes understanding

## 2014-07-23 NOTE — Telephone Encounter (Signed)
Has to be seen to get pain meds 

## 2014-07-27 ENCOUNTER — Other Ambulatory Visit: Payer: Self-pay | Admitting: *Deleted

## 2014-07-27 DIAGNOSIS — K219 Gastro-esophageal reflux disease without esophagitis: Secondary | ICD-10-CM

## 2014-07-27 MED ORDER — PANTOPRAZOLE SODIUM 40 MG PO TBEC: 40.0000 mg | DELAYED_RELEASE_TABLET | Freq: Every day | ORAL | Status: DC

## 2014-07-29 ENCOUNTER — Encounter: Payer: Self-pay | Admitting: Nurse Practitioner

## 2014-07-29 ENCOUNTER — Ambulatory Visit (INDEPENDENT_AMBULATORY_CARE_PROVIDER_SITE_OTHER): Payer: Medicare Other | Admitting: Nurse Practitioner

## 2014-07-29 VITALS — BP 134/84 | HR 67 | Temp 98.2°F | Ht 63.0 in | Wt 192.0 lb

## 2014-07-29 DIAGNOSIS — G4700 Insomnia, unspecified: Secondary | ICD-10-CM | POA: Diagnosis not present

## 2014-07-29 DIAGNOSIS — R609 Edema, unspecified: Secondary | ICD-10-CM | POA: Diagnosis not present

## 2014-07-29 DIAGNOSIS — E039 Hypothyroidism, unspecified: Secondary | ICD-10-CM

## 2014-07-29 DIAGNOSIS — E669 Obesity, unspecified: Secondary | ICD-10-CM

## 2014-07-29 DIAGNOSIS — I2510 Atherosclerotic heart disease of native coronary artery without angina pectoris: Secondary | ICD-10-CM | POA: Diagnosis not present

## 2014-07-29 DIAGNOSIS — F332 Major depressive disorder, recurrent severe without psychotic features: Secondary | ICD-10-CM

## 2014-07-29 DIAGNOSIS — J4520 Mild intermittent asthma, uncomplicated: Secondary | ICD-10-CM

## 2014-07-29 DIAGNOSIS — E876 Hypokalemia: Secondary | ICD-10-CM | POA: Diagnosis not present

## 2014-07-29 DIAGNOSIS — I10 Essential (primary) hypertension: Secondary | ICD-10-CM | POA: Diagnosis not present

## 2014-07-29 DIAGNOSIS — E785 Hyperlipidemia, unspecified: Secondary | ICD-10-CM

## 2014-07-29 DIAGNOSIS — D509 Iron deficiency anemia, unspecified: Secondary | ICD-10-CM

## 2014-07-29 DIAGNOSIS — N951 Menopausal and female climacteric states: Secondary | ICD-10-CM | POA: Diagnosis not present

## 2014-07-29 DIAGNOSIS — F411 Generalized anxiety disorder: Secondary | ICD-10-CM

## 2014-07-29 MED ORDER — ESTROGENS CONJUGATED 1.25 MG PO TABS: 1.2500 mg | ORAL_TABLET | Freq: Every day | ORAL | Status: DC

## 2014-07-29 MED ORDER — SERTRALINE HCL 50 MG PO TABS
50.0000 mg | ORAL_TABLET | Freq: Every day | ORAL | Status: DC
Start: 2014-07-29 — End: 2014-10-11

## 2014-07-29 MED ORDER — SYNTHROID 50 MCG PO TABS: ORAL_TABLET | ORAL | Status: DC

## 2014-07-29 NOTE — Patient Instructions (Signed)
Stress and Stress Management Stress is a normal reaction to life events. It is what you feel when life demands more than you are used to or more than you can handle. Some stress can be useful. For example, the stress reaction can help you catch the last bus of the day, study for a test, or meet a deadline at work. But stress that occurs too often or for too long can cause problems. It can affect your emotional health and interfere with relationships and normal daily activities. Too much stress can weaken your immune system and increase your risk for physical illness. If you already have a medical problem, stress can make it worse. CAUSES  All sorts of life events may cause stress. An event that causes stress for one person may not be stressful for another person. Major life events commonly cause stress. These may be positive or negative. Examples include losing your job, moving into a new home, getting married, having a baby, or losing a loved one. Less obvious life events may also cause stress, especially if they occur day after day or in combination. Examples include working long hours, driving in traffic, caring for children, being in debt, or being in a difficult relationship. SIGNS AND SYMPTOMS Stress may cause emotional symptoms including, the following:  Anxiety. This is feeling worried, afraid, on edge, overwhelmed, or out of control.  Anger. This is feeling irritated or impatient.  Depression. This is feeling sad, down, helpless, or guilty.  Difficulty focusing, remembering, or making decisions. Stress may cause physical symptoms, including the following:   Aches and pains. These may affect your head, neck, back, stomach, or other areas of your body.  Tight muscles or clenched jaw.  Low energy or trouble sleeping. Stress may cause unhealthy behaviors, including the following:   Eating to feel better (overeating) or skipping meals.  Sleeping too little, too much, or both.  Working  too much or putting off tasks (procrastination).  Smoking, drinking alcohol, or using drugs to feel better. DIAGNOSIS  Stress is diagnosed through an assessment by your health care provider. Your health care provider will ask questions about your symptoms and any stressful life events.Your health care provider will also ask about your medical history and may order blood tests or other tests. Certain medical conditions and medicine can cause physical symptoms similar to stress. Mental illness can cause emotional symptoms and unhealthy behaviors similar to stress. Your health care provider may refer you to a mental health professional for further evaluation.  TREATMENT  Stress management is the recommended treatment for stress.The goals of stress management are reducing stressful life events and coping with stress in healthy ways.  Techniques for reducing stressful life events include the following:  Stress identification. Self-monitor for stress and identify what causes stress for you. These skills may help you to avoid some stressful events.  Time management. Set your priorities, keep a calendar of events, and learn to say "no." These tools can help you avoid making too many commitments. Techniques for coping with stress include the following:  Rethinking the problem. Try to think realistically about stressful events rather than ignoring them or overreacting. Try to find the positives in a stressful situation rather than focusing on the negatives.  Exercise. Physical exercise can release both physical and emotional tension. The key is to find a form of exercise you enjoy and do it regularly.  Relaxation techniques. These relax the body and mind. Examples include yoga, meditation, tai chi, biofeedback, deep  breathing, progressive muscle relaxation, listening to music, being out in nature, journaling, and other hobbies. Again, the key is to find one or more that you enjoy and can do  regularly.  Healthy lifestyle. Eat a balanced diet, get plenty of sleep, and do not smoke. Avoid using alcohol or drugs to relax.  Strong support network. Spend time with family, friends, or other people you enjoy being around.Express your feelings and talk things over with someone you trust. Counseling or talktherapy with a mental health professional may be helpful if you are having difficulty managing stress on your own. Medicine is typically not recommended for the treatment of stress.Talk to your health care provider if you think you need medicine for symptoms of stress. HOME CARE INSTRUCTIONS  Keep all follow-up visits as directed by your health care provider.  Take all medicines as directed by your health care provider. SEEK MEDICAL CARE IF:  Your symptoms get worse or you start having new symptoms.  You feel overwhelmed by your problems and can no longer manage them on your own. SEEK IMMEDIATE MEDICAL CARE IF:  You feel like hurting yourself or someone else. Document Released: 12/05/2000 Document Revised: 10/26/2013 Document Reviewed: 02/03/2013 Exit[employer] Patient Information 2015 [employer]. This information is not intended to replace advice given to you by your health care provider. Make sure you discuss any questions you have with your health care provider.  

## 2014-07-29 NOTE — Progress Notes (Signed)
Subjective:    Patient ID: Sharon Compton, female    DOB: [birth year], 74 y.o.   MRN: [id number]  HPI  Patient here today for follow up- She was recently diagnosed with a hernia and has referral to see specialist. Shs is doing well today. SHe has lots of chronic medical problems but there have been no changes since last visit. She is still having lots of swelling despite being torsemide. She has stopped taking her potassium and is having some cramps in lower ext.. She has also has not been  taken synthroid for 3-4 weeks. She is c/o having hot flashes- she use to take premarin but stopped many years ago. She is also C/o depression- use to be on zoloft but she stopped taking many years ago because she thought she did not need it anymore. Patient Active Problem List   Diagnosis Date Noted  . Iron deficiency anemia 03/19/2014  . Atrial fibrillation with RVR 11/03/2013  . Insomnia 07/16/2013  . Major depressive disorder, recurrent episode, severe, without mention of psychotic behavior 06/14/2013  . Posttraumatic stress disorder 06/14/2013  . Acute on chronic diastolic CHF (congestive heart failure), NYHA class 3 03/05/2013  . CAD (coronary artery disease), native coronary artery 03/05/2013  . Angina, class III 03/05/2013  . Hypokalemia 03/05/2013  . HTN (hypertension), benign 09/27/2012  . Hyperglycemia 10/03/2011  . Asthma 10/01/2011  . Campath-induced atrial fibrillation 10/01/2011  . 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 Diverticulosis 10/01/2011  . Peripheral edema 08/23/2011  . Elevated LFTs 08/22/2011  . Anxiety disorder 08/21/2011  . GI bleed 08/20/2011  . Obesity 08/20/2011   Outpatient Encounter Prescriptions as of 07/29/2014  Medication Sig  . FERREX 150 150 MG capsule TAKE ONE CAPSULE BY MOUTH ONCE DAILY  . ALPRAZolam (XANAX) 1 MG tablet Take 1 tablet (1 mg total) by mouth 4 (four) times daily.  [name] aspirin EC 81 MG tablet Take 1 tablet (81 mg total) by mouth  daily.  [name] gabapentin (NEURONTIN) 400 MG capsule TAKE ONE CAPSULE BY MOUTH 4 TIMES DAILY  . lisinopril (PRINIVIL,ZESTRIL) 40 MG tablet Take 1 tablet (40 mg total) by mouth daily.  . metoprolol tartrate (LOPRESSOR) 25 MG tablet Take 1 tablet (25 mg total) by mouth 2 (two) times daily.  . nitroGLYCERIN (NITROSTAT) 0.4 MG SL tablet Place 1 tablet (0.4 mg total) under the tongue every 5 (five) minutes as needed for chest pain.  [name] oxyCODONE-acetaminophen (ROXICET) 5-325 MG per tablet Take 1 tablet by mouth every 8 (eight) hours as needed for severe pain.  . pantoprazole (PROTONIX) 40 MG tablet Take 1 tablet (40 mg total) by mouth daily.  . potassium chloride SA (K-DUR,KLOR-CON) 20 MEQ tablet Take 1 tablet (20 mEq total) by mouth daily.  [name] SYNTHROID 50 MCG tablet 1 po every other day  . torsemide (DEMADEX) 20 MG tablet Take 20 mg by mouth daily.  . [DISCONTINUED] benzonatate (TESSALON) 100 MG capsule Take 1 capsule (100 mg total) by mouth 2 (two) times daily as needed for cough.  . [DISCONTINUED] iron polysaccharides (NIFEREX) 150 MG capsule Take 150 mg by mouth daily.       Review of Systems  Constitutional: Negative.   HENT: Negative.   Respiratory: Negative.   Cardiovascular: Positive for palpitations and leg swelling.  Gastrointestinal: Negative.   Genitourinary: Negative.   Neurological: Negative.   Psychiatric/Behavioral: Negative.   All other systems reviewed and are negative.      Objective:   Physical Exam  Constitutional: She is oriented to person, place, and time. She appears well-developed and well-nourished.  HENT:  Nose: Nose normal.  Mouth/Throat: Oropharynx is clear and moist.  Eyes: EOM are normal.  Neck: Trachea normal, normal range of motion and full passive range of motion without pain. Neck supple. No JVD present. Carotid bruit is not present. No thyromegaly present.  Cardiovascular: Normal rate, regular rhythm, normal heart sounds and intact distal pulses.  Exam  reveals no gallop and no friction rub.   No murmur heard. Pulmonary/Chest: Effort normal and breath sounds normal.  Abdominal: Soft. Bowel sounds are normal. She exhibits no distension and no mass. There is no tenderness.  Abdominal wall hernia  Musculoskeletal: Normal range of motion.  Lymphadenopathy:    She has no cervical adenopathy.  Neurological: She is alert and oriented to person, place, and time. She has normal reflexes.  Skin: Skin is warm and dry.  Psychiatric: She has a normal mood and affect. Her behavior is normal. Judgment and thought content normal.  Tearful during exam    BP 134/84 mmHg  Pulse 67  Temp(Src) 98.2 F (36.8 C) (Oral)  Ht '5\' 3"'  (1.6 m)  Wt 192 lb (87.091 kg)  BMI 34.02 kg/m2        Assessment & Plan:   1. HTN (hypertension), benign   2. Coronary artery disease involving native coronary artery of native heart without angina pectoris   3. Asthma, mild intermittent, uncomplicated   4. Peripheral edema   5. Obesity   6. Major depressive disorder, recurrent, severe without psychotic features   7. Iron deficiency anemia   8. Insomnia   9. Hypokalemia   10. Generalized anxiety disorder   11. Menopausal hot flushes   12. Hyperlipidemia with target LDL less than 100   13. Hypothyroidism, unspecified hypothyroidism type    Meds ordered this encounter  Medications  . estrogens, conjugated, (PREMARIN) 1.25 MG tablet    Sig: Take 1 tablet (1.25 mg total) by mouth daily.    Dispense:  30 tablet    Refill:  5    Order Specific Question:  Supervising Provider    Answer:  Chipper Herb [[id number]]  . SYNTHROID 50 MCG tablet    Sig: 1 po every other day    Dispense:  90 tablet    Refill:  1    Order Specific Question:  Supervising Provider    Answer:  Chipper Herb [[id number]]  . sertraline (ZOLOFT) 50 MG tablet    Sig: Take 1 tablet (50 mg total) by mouth daily.    Dispense:  30 tablet    Refill:  3    Order Specific Question:  Supervising Provider      Answer:  Chipper Herb [[id number]]   Orders Placed This Encounter  Procedures  . CMP14+EGFR  . NMR, lipoprofile   Needs to get back on synthroid Added zoloft and premarin Labs pending Encouraged to get back on counseling RTO prn  Sharon Hassell Done, FNP

## 2014-07-30 LAB — CMP14+EGFR
ALBUMIN: 4.2 g/dL (ref 3.5–4.8)
ALK PHOS: 87 IU/L (ref 39–117)
ALT: 8 IU/L (ref 0–32)
AST: 16 IU/L (ref 0–40)
Albumin/Globulin Ratio: 1.8 (ref 1.1–2.5)
BUN/Creatinine Ratio: 18 (ref 11–26)
BUN: 17 mg/dL (ref 8–27)
CO2: 27 mmol/L (ref 18–29)
CREATININE: 0.96 mg/dL (ref 0.57–1.00)
Calcium: 9.2 mg/dL (ref 8.7–10.3)
Chloride: 103 mmol/L (ref 97–108)
GFR calc Af Amer: 68 mL/min/{1.73_m2} (ref >59)
GFR, EST NON AFRICAN AMERICAN: 59 mL/min/{1.73_m2} — AB (ref >59)
GLUCOSE: 97 mg/dL (ref 65–99)
Globulin, Total: 2.4 g/dL (ref 1.5–4.5)
Potassium: 3.9 mmol/L (ref 3.5–5.2)
Sodium: 142 mmol/L (ref 134–144)
TOTAL PROTEIN: 6.6 g/dL (ref 6.0–8.5)
Total Bilirubin: 0.3 mg/dL (ref 0.0–1.2)

## 2014-07-30 LAB — NMR, LIPOPROFILE
CHOLESTEROL: 284 mg/dL — AB (ref 100–199)
HDL Cholesterol by NMR: 44 mg/dL (ref >39)
HDL Particle Number: 24.2 umol/L — ABNORMAL LOW (ref >=30.5)
LDL Particle Number: 2707 nmol/L — ABNORMAL HIGH (ref <1000)
LDL Size: 20.9 nm (ref >20.5)
LDL-C: 208 mg/dL — ABNORMAL HIGH (ref 0–99)
LP-IR Score: 63 — ABNORMAL HIGH (ref <=45)
Small LDL Particle Number: 1373 nmol/L — ABNORMAL HIGH (ref <=527)
Triglycerides by NMR: 159 mg/dL — ABNORMAL HIGH (ref 0–149)

## 2014-08-04 ENCOUNTER — Other Ambulatory Visit: Payer: Self-pay | Admitting: Nurse Practitioner

## 2014-08-04 MED ORDER — ROSUVASTATIN CALCIUM 10 MG PO TABS: 10.0000 mg | ORAL_TABLET | Freq: Every day | ORAL | Status: DC

## 2014-08-12 ENCOUNTER — Telehealth: Payer: Self-pay | Admitting: Nurse Practitioner

## 2014-08-12 MED ORDER — PRAVASTATIN SODIUM 80 MG PO TABS: 80.0000 mg | ORAL_TABLET | Freq: Every day | ORAL | Status: DC

## 2014-08-12 NOTE — Telephone Encounter (Signed)
crestor changed to pravastatin

## 2014-08-12 NOTE — Telephone Encounter (Signed)
Stp she says the crestor is making her heart beat too fast, her husband has pravastatin and she wants to know if she can take the pravastatin instead.

## 2014-08-13 NOTE — Telephone Encounter (Signed)
Patient aware.

## 2014-08-19 DIAGNOSIS — K432 Incisional hernia without obstruction or gangrene: Secondary | ICD-10-CM | POA: Diagnosis not present

## 2014-08-20 ENCOUNTER — Other Ambulatory Visit: Payer: Self-pay | Admitting: Nurse Practitioner

## 2014-09-07 ENCOUNTER — Other Ambulatory Visit: Payer: Self-pay | Admitting: Family Medicine

## 2014-09-08 NOTE — Telephone Encounter (Signed)
Last seen 07/29/14 MMM If approved route to nurse to call into [facility]

## 2014-09-09 NOTE — Telephone Encounter (Signed)
Please call in xanax with 1 refills 

## 2014-09-09 NOTE — Telephone Encounter (Signed)
Refill called to [facility] 

## 2014-09-27 ENCOUNTER — Inpatient Hospital Stay (HOSPITAL_COMMUNITY)
Admission: EM | Admit: 2014-09-27 | Discharge: 2014-09-29 | DRG: 393 | Disposition: A | Discharge status: Home / Self Care | Payer: Medicare Other | Attending: Internal Medicine | Admitting: Internal Medicine

## 2014-09-27 ENCOUNTER — Emergency Department (HOSPITAL_COMMUNITY): Payer: Medicare Other

## 2014-09-27 ENCOUNTER — Encounter (HOSPITAL_COMMUNITY): Payer: Self-pay

## 2014-09-27 DIAGNOSIS — Z8701 Personal history of pneumonia (recurrent): Secondary | ICD-10-CM

## 2014-09-27 DIAGNOSIS — K5733 Diverticulitis of large intestine without perforation or abscess with bleeding: Secondary | ICD-10-CM | POA: Diagnosis present

## 2014-09-27 DIAGNOSIS — K5793 Diverticulitis of intestine, part unspecified, without perforation or abscess with bleeding: Secondary | ICD-10-CM | POA: Diagnosis not present

## 2014-09-27 DIAGNOSIS — R000 Tachycardia, unspecified: Secondary | ICD-10-CM | POA: Diagnosis not present

## 2014-09-27 DIAGNOSIS — I10 Essential (primary) hypertension: Secondary | ICD-10-CM | POA: Diagnosis not present

## 2014-09-27 DIAGNOSIS — Z6837 Body mass index (BMI) 37.0-37.9, adult: Secondary | ICD-10-CM

## 2014-09-27 DIAGNOSIS — D649 Anemia, unspecified: Secondary | ICD-10-CM | POA: Diagnosis present

## 2014-09-27 DIAGNOSIS — N951 Menopausal and female climacteric states: Secondary | ICD-10-CM | POA: Diagnosis present

## 2014-09-27 DIAGNOSIS — K5713 Diverticulitis of small intestine without perforation or abscess with bleeding: Secondary | ICD-10-CM | POA: Diagnosis not present

## 2014-09-27 DIAGNOSIS — G4700 Insomnia, unspecified: Secondary | ICD-10-CM | POA: Diagnosis not present

## 2014-09-27 DIAGNOSIS — E669 Obesity, unspecified: Secondary | ICD-10-CM | POA: Diagnosis present

## 2014-09-27 DIAGNOSIS — K5732 Diverticulitis of large intestine without perforation or abscess without bleeding: Secondary | ICD-10-CM | POA: Diagnosis not present

## 2014-09-27 DIAGNOSIS — F329 Major depressive disorder, single episode, unspecified: Secondary | ICD-10-CM | POA: Diagnosis present

## 2014-09-27 DIAGNOSIS — I480 Paroxysmal atrial fibrillation: Secondary | ICD-10-CM | POA: Diagnosis present

## 2014-09-27 DIAGNOSIS — J45909 Unspecified asthma, uncomplicated: Secondary | ICD-10-CM | POA: Diagnosis present

## 2014-09-27 DIAGNOSIS — I252 Old myocardial infarction: Secondary | ICD-10-CM

## 2014-09-27 DIAGNOSIS — Q2733 Arteriovenous malformation of digestive system vessel: Secondary | ICD-10-CM | POA: Diagnosis not present

## 2014-09-27 DIAGNOSIS — E039 Hypothyroidism, unspecified: Secondary | ICD-10-CM | POA: Diagnosis present

## 2014-09-27 DIAGNOSIS — K210 Gastro-esophageal reflux disease with esophagitis: Secondary | ICD-10-CM | POA: Diagnosis present

## 2014-09-27 DIAGNOSIS — Z809 Family history of malignant neoplasm, unspecified: Secondary | ICD-10-CM

## 2014-09-27 DIAGNOSIS — Z91041 Radiographic dye allergy status: Secondary | ICD-10-CM | POA: Diagnosis not present

## 2014-09-27 DIAGNOSIS — Z882 Allergy status to sulfonamides status: Secondary | ICD-10-CM | POA: Diagnosis not present

## 2014-09-27 DIAGNOSIS — J449 Chronic obstructive pulmonary disease, unspecified: Secondary | ICD-10-CM | POA: Diagnosis present

## 2014-09-27 DIAGNOSIS — Z8249 Family history of ischemic heart disease and other diseases of the circulatory system: Secondary | ICD-10-CM | POA: Diagnosis not present

## 2014-09-27 DIAGNOSIS — M797 Fibromyalgia: Secondary | ICD-10-CM | POA: Diagnosis present

## 2014-09-27 DIAGNOSIS — Z955 Presence of coronary angioplasty implant and graft: Secondary | ICD-10-CM | POA: Diagnosis not present

## 2014-09-27 DIAGNOSIS — K5792 Diverticulitis of intestine, part unspecified, without perforation or abscess without bleeding: Secondary | ICD-10-CM

## 2014-09-27 DIAGNOSIS — Z9049 Acquired absence of other specified parts of digestive tract: Secondary | ICD-10-CM | POA: Diagnosis present

## 2014-09-27 DIAGNOSIS — Z833 Family history of diabetes mellitus: Secondary | ICD-10-CM | POA: Diagnosis not present

## 2014-09-27 DIAGNOSIS — Z87891 Personal history of nicotine dependence: Secondary | ICD-10-CM

## 2014-09-27 DIAGNOSIS — K922 Gastrointestinal hemorrhage, unspecified: Secondary | ICD-10-CM

## 2014-09-27 DIAGNOSIS — Z9109 Other allergy status, other than to drugs and biological substances: Secondary | ICD-10-CM

## 2014-09-27 DIAGNOSIS — F32A Depression, unspecified: Secondary | ICD-10-CM | POA: Insufficient documentation

## 2014-09-27 DIAGNOSIS — K921 Melena: Secondary | ICD-10-CM | POA: Diagnosis present

## 2014-09-27 DIAGNOSIS — F419 Anxiety disorder, unspecified: Secondary | ICD-10-CM | POA: Diagnosis present

## 2014-09-27 DIAGNOSIS — K648 Other hemorrhoids: Secondary | ICD-10-CM | POA: Diagnosis not present

## 2014-09-27 DIAGNOSIS — K5900 Constipation, unspecified: Secondary | ICD-10-CM | POA: Diagnosis present

## 2014-09-27 DIAGNOSIS — I5032 Chronic diastolic (congestive) heart failure: Secondary | ICD-10-CM | POA: Diagnosis present

## 2014-09-27 DIAGNOSIS — Z7982 Long term (current) use of aspirin: Secondary | ICD-10-CM

## 2014-09-27 DIAGNOSIS — K219 Gastro-esophageal reflux disease without esophagitis: Secondary | ICD-10-CM | POA: Diagnosis not present

## 2014-09-27 DIAGNOSIS — K625 Hemorrhage of anus and rectum: Secondary | ICD-10-CM

## 2014-09-27 DIAGNOSIS — I2510 Atherosclerotic heart disease of native coronary artery without angina pectoris: Secondary | ICD-10-CM | POA: Diagnosis present

## 2014-09-27 DIAGNOSIS — K5712 Diverticulitis of small intestine without perforation or abscess without bleeding: Secondary | ICD-10-CM | POA: Diagnosis not present

## 2014-09-27 DIAGNOSIS — Z8711 Personal history of peptic ulcer disease: Secondary | ICD-10-CM

## 2014-09-27 DIAGNOSIS — Z885 Allergy status to narcotic agent status: Secondary | ICD-10-CM

## 2014-09-27 DIAGNOSIS — R9431 Abnormal electrocardiogram [ECG] [EKG]: Secondary | ICD-10-CM | POA: Diagnosis not present

## 2014-09-27 LAB — CBC WITH DIFFERENTIAL/PLATELET
Basophils Absolute: 0.1 10*3/uL (ref 0.0–0.1)
Basophils Relative: 1 % (ref 0–1)
Eosinophils Absolute: 0.1 10*3/uL (ref 0.0–0.7)
Eosinophils Relative: 1 % (ref 0–5)
HEMATOCRIT: 37.6 % (ref 36.0–46.0)
HEMOGLOBIN: 11.6 g/dL — AB (ref 12.0–15.0)
LYMPHS PCT: 41 % (ref 12–46)
Lymphs Abs: 2.9 10*3/uL (ref 0.7–4.0)
MCH: 25.8 pg — ABNORMAL LOW (ref 26.0–34.0)
MCHC: 30.9 g/dL (ref 30.0–36.0)
MCV: 83.6 fL (ref 78.0–100.0)
MONO ABS: 0.5 10*3/uL (ref 0.1–1.0)
MONOS PCT: 7 % (ref 3–12)
Neutro Abs: 3.6 10*3/uL (ref 1.7–7.7)
Neutrophils Relative %: 50 % (ref 43–77)
Platelets: 181 10*3/uL (ref 150–400)
RBC: 4.5 MIL/uL (ref 3.87–5.11)
RDW: 15.6 % — AB (ref 11.5–15.5)
WBC: 7.1 10*3/uL (ref 4.0–10.5)

## 2014-09-27 LAB — COMPREHENSIVE METABOLIC PANEL
ALT: 15 U/L (ref 0–35)
AST: 19 U/L (ref 0–37)
Albumin: 3.8 g/dL (ref 3.5–5.2)
Alkaline Phosphatase: 76 U/L (ref 39–117)
Anion gap: 9 (ref 5–15)
BUN: 17 mg/dL (ref 6–23)
CHLORIDE: 104 mmol/L (ref 96–112)
CO2: 29 mmol/L (ref 19–32)
CREATININE: 0.94 mg/dL (ref 0.50–1.10)
Calcium: 9.2 mg/dL (ref 8.4–10.5)
GFR, EST AFRICAN AMERICAN: 68 mL/min — AB (ref >90)
GFR, EST NON AFRICAN AMERICAN: 59 mL/min — AB (ref >90)
Glucose, Bld: 96 mg/dL (ref 70–99)
POTASSIUM: 4.1 mmol/L (ref 3.5–5.1)
Sodium: 142 mmol/L (ref 135–145)
Total Bilirubin: 0.5 mg/dL (ref 0.3–1.2)
Total Protein: 7 g/dL (ref 6.0–8.3)

## 2014-09-27 LAB — I-STAT CG4 LACTIC ACID, ED
Lactic Acid, Venous: 0.81 mmol/L (ref 0.5–2.0)
Lactic Acid, Venous: 0.56 mmol/L (ref 0.5–2.0)

## 2014-09-27 LAB — PROTIME-INR
INR: 1 (ref 0.00–1.49)
PROTHROMBIN TIME: 13.3 s (ref 11.6–15.2)

## 2014-09-27 LAB — TROPONIN I: Troponin I: <0.03 ng/mL (ref <0.031)

## 2014-09-27 MED ORDER — CIPROFLOXACIN IN D5W 400 MG/200ML IV SOLN
400.0000 mg | Freq: Once | INTRAVENOUS | Status: AC
  Administered 2014-09-27: 400 mg via INTRAVENOUS
  Filled 2014-09-27: qty 200

## 2014-09-27 MED ORDER — SODIUM CHLORIDE 0.9 % IV SOLN
INTRAVENOUS | Status: DC
  Administered 2014-09-28: via INTRAVENOUS

## 2014-09-27 MED ORDER — NITROGLYCERIN 0.4 MG SL SUBL
0.4000 mg | SUBLINGUAL_TABLET | SUBLINGUAL | Status: DC | PRN
Start: 2014-09-27 — End: 2014-09-29

## 2014-09-27 MED ORDER — PRAVASTATIN SODIUM 80 MG PO TABS
80.0000 mg | ORAL_TABLET | Freq: Every day | ORAL | Status: DC
  Administered 2014-09-28: 80 mg via ORAL
  Filled 2014-09-27 (×2): qty 1

## 2014-09-27 MED ORDER — PANTOPRAZOLE SODIUM 40 MG IV SOLR
40.0000 mg | Freq: Two times a day (BID) | INTRAVENOUS | Status: DC
  Administered 2014-09-28 – 2014-09-29 (×4): 40 mg via INTRAVENOUS
  Filled 2014-09-27 (×5): qty 40

## 2014-09-27 MED ORDER — ALPRAZOLAM 0.5 MG PO TABS
0.5000 mg | ORAL_TABLET | Freq: Once | ORAL | Status: AC
  Administered 2014-09-27: 0.5 mg via ORAL
  Filled 2014-09-27: qty 1

## 2014-09-27 MED ORDER — ACETAMINOPHEN 650 MG RE SUPP: 650.0000 mg | Freq: Four times a day (QID) | RECTAL | Status: DC | PRN

## 2014-09-27 MED ORDER — ZOLPIDEM TARTRATE 5 MG PO TABS
5.0000 mg | ORAL_TABLET | Freq: Every evening | ORAL | Status: DC | PRN
Start: 2014-09-27 — End: 2014-09-29
  Administered 2014-09-28: 5 mg via ORAL
  Filled 2014-09-27: qty 1

## 2014-09-27 MED ORDER — ACETAMINOPHEN 325 MG PO TABS
650.0000 mg | ORAL_TABLET | Freq: Once | ORAL | Status: AC
  Administered 2014-09-27: 650 mg via ORAL
  Filled 2014-09-27: qty 2

## 2014-09-27 MED ORDER — ALPRAZOLAM 1 MG PO TABS
1.0000 mg | ORAL_TABLET | Freq: Four times a day (QID) | ORAL | Status: DC | PRN
  Administered 2014-09-28 – 2014-09-29 (×5): 1 mg via ORAL
  Filled 2014-09-27 (×5): qty 1

## 2014-09-27 MED ORDER — POLYSACCHARIDE IRON COMPLEX 150 MG PO CAPS
150.0000 mg | ORAL_CAPSULE | Freq: Every day | ORAL | Status: DC
  Administered 2014-09-28 – 2014-09-29 (×2): 150 mg via ORAL
  Filled 2014-09-27 (×2): qty 1

## 2014-09-27 MED ORDER — TORSEMIDE 20 MG PO TABS
20.0000 mg | ORAL_TABLET | Freq: Every day | ORAL | Status: DC
  Administered 2014-09-29: 20 mg via ORAL
  Filled 2014-09-27 (×2): qty 1

## 2014-09-27 MED ORDER — METRONIDAZOLE IN NACL 5-0.79 MG/ML-% IV SOLN
500.0000 mg | Freq: Three times a day (TID) | INTRAVENOUS | Status: DC
  Administered 2014-09-27 – 2014-09-29 (×5): 500 mg via INTRAVENOUS
  Filled 2014-09-27 (×7): qty 100

## 2014-09-27 MED ORDER — LEVOTHYROXINE SODIUM 50 MCG PO TABS
50.0000 ug | ORAL_TABLET | ORAL | Status: DC
  Administered 2014-09-28: 50 ug via ORAL
  Filled 2014-09-27 (×2): qty 1

## 2014-09-27 MED ORDER — SODIUM CHLORIDE 0.9 % IJ SOLN
3.0000 mL | Freq: Two times a day (BID) | INTRAMUSCULAR | Status: DC
  Administered 2014-09-27 – 2014-09-28 (×2): 3 mL via INTRAVENOUS

## 2014-09-27 MED ORDER — CIPROFLOXACIN IN D5W 400 MG/200ML IV SOLN
400.0000 mg | Freq: Two times a day (BID) | INTRAVENOUS | Status: DC
  Administered 2014-09-28 – 2014-09-29 (×3): 400 mg via INTRAVENOUS
  Filled 2014-09-27 (×4): qty 200

## 2014-09-27 MED ORDER — ACETAMINOPHEN 325 MG PO TABS
650.0000 mg | ORAL_TABLET | Freq: Four times a day (QID) | ORAL | Status: DC | PRN
  Administered 2014-09-28: 650 mg via ORAL
  Filled 2014-09-27: qty 2

## 2014-09-27 MED ORDER — METOPROLOL TARTRATE 25 MG PO TABS
25.0000 mg | ORAL_TABLET | Freq: Two times a day (BID) | ORAL | Status: DC
  Administered 2014-09-28 – 2014-09-29 (×4): 25 mg via ORAL
  Filled 2014-09-27 (×5): qty 1

## 2014-09-27 MED ORDER — LISINOPRIL 40 MG PO TABS
40.0000 mg | ORAL_TABLET | Freq: Every day | ORAL | Status: DC
  Filled 2014-09-27: qty 1

## 2014-09-27 MED ORDER — GABAPENTIN 400 MG PO CAPS
400.0000 mg | ORAL_CAPSULE | Freq: Three times a day (TID) | ORAL | Status: DC
  Administered 2014-09-28 – 2014-09-29 (×5): 400 mg via ORAL
  Filled 2014-09-27 (×7): qty 1

## 2014-09-27 MED ORDER — PANTOPRAZOLE SODIUM 40 MG IV SOLR
40.0000 mg | Freq: Once | INTRAVENOUS | Status: AC
  Administered 2014-09-27: 40 mg via INTRAVENOUS
  Filled 2014-09-27: qty 40

## 2014-09-27 NOTE — H&P (Signed)
Sharon Compton is an 74 y.o. female.    Pcp: unassigned  Chief Complaint:  HPI: 74 yo female with hypertension, cad s/p MI, diverticulosis, avm c/o rectal bleeding starting this am.  When she looked into the commode had evidence of blood in toilet, and also on the toilet paper.  Pt had 3 bm like this . Sharp pain just below belly button.  Pt denies fever, chills,  N/v, diarrhea.  Pt has dark stool but takes iron.   Pt denies nsaid use.   Pt is on aspirin. Pt has hx of gi bleeding.  Pt was seen in ED, and found on CT scan to have diverticulitis.  Pt will be admitted for diverticulitis.    Past Medical History  Diagnosis Date  . Hypertension   . Anxiety and depression   . Asthma   . Fibromyalgia   . History of pneumonia   . Peripheral edema   . Peptic ulcer disease   . Hiatal hernia   . GERD (gastroesophageal reflux disease)   . A-fib     cardioversion and TEE at [facility]; pt reports DCCV 2014 at [facility] as well.  . 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 Diverticular disease 10/01/2011  . MI, acute, non ST segment elevation 10/02/2011  . GI bleed     ?recurrent--therefore, no anticoagulant  . COPD (chronic obstructive pulmonary disease)   . Lymphedema   . CAD (coronary artery disease) 02/2013    2.75 x 32 mm Rebel bare metal stent in the distal RCA.     Past Surgical History  Procedure Laterality Date  . Abdominal hysterectomy  age 59    nonmalignant reason  . Cholecystectomy    . Abdominal exploration surgery    . Abd tumor removed      states was 10 lbs, benign  . Knee surgery    . Bladder stent    . Esophagogastroduodenoscopy  08/24/2011    Procedure: ESOPHAGOGASTRODUODENOSCOPY (EGD);  Surgeon: Daneil Dolin, MD;  Location[facility];  Service: Endoscopy;  Laterality: N/A;  give phenergan 12.16m iv 30 mins prior to procedure  . Colonoscopy  08/25/2011    Procedure: COLONOSCOPY;  Surgeon: RDaneil Dolin MD;  Location: [facility];  Service: Endoscopy;   Laterality: N/A;  . Colonoscopy  10/03/2011    Procedure: COLONOSCOPY;  Surgeon: RDaneil Dolin MD;  Location: [facility];  Service: Endoscopy;  Laterality: N/A;  NEEDS PHENERGAN 25 MG IV ON CALL  . Colonoscopy  10/04/2011    Procedure: COLONOSCOPY;  Surgeon: RDaneil Dolin MD;  Location: [facility];  Service: Endoscopy;  Laterality: N/A;  Phenergan 12.5 mg ON CALL  . Transthoracic echocardiogram  09/2011    EF 60-65%, septal hypokinesia  . Cardiovascular stress test  09/2011    equivocal result, most likely low risk; pt refused the recommended cardiac cath to follow this up.  . Coronary angioplasty  02/2013  . Percutaneous coronary stent intervention (pci-s)  02/2013    2.75 x 32 mm Rebel bare metal stent in the distal RCA.   .Marland KitchenLeft and right heart catheterization with coronary angiogram N/A 03/03/2013    Procedure: LEFT AND RIGHT HEART CATHETERIZATION WITH CORONARY ANGIOGRAM;  Surgeon: CBurnell Blanks MD;  [facility];  Service: Cardiovascular;  Laterality: N/A;    Family History  Problem Relation Age of Onset  . Diabetes Mother     Deceased  . Hypertension Mother   . Cancer  Father     Deceased  . Colon cancer Neg Hx   . Coronary artery disease Mother   . Heart failure Mother    Social History:  reports that she quit smoking about 19 years ago. Her smoking use included Cigarettes. She has a 15 pack-year smoking history. She has never used smokeless tobacco. She reports that she does not drink alcohol or use illicit drugs.  Allergies:  Allergies  Allergen Reactions  . Iohexol Shortness Of Breath    PT STATES CONTRAST ALLERGY CAUSED SHORTNESS OF BREATH IN THE 70'S  . Dye Fdc Red [Red Dye]   . Sulfa Antibiotics Itching  . Codeine Itching and Palpitations   Medications reviewed  (Not in a hospital admission)  Results for orders placed or performed during the hospital encounter of 09/27/14 (from the past 48 hour(s))  CBC with Differential/Platelet     Status:  Abnormal   Collection Time: 09/27/14  7:30 PM  Result Value Ref Range   WBC 7.1 4.0 - 10.5 K/uL   RBC 4.50 3.87 - 5.11 MIL/uL   Hemoglobin 11.6 (L) 12.0 - 15.0 g/dL   HCT 37.6 36.0 - 46.0 %   MCV 83.6 78.0 - 100.0 fL   MCH 25.8 (L) 26.0 - 34.0 pg   MCHC 30.9 30.0 - 36.0 g/dL   RDW 15.6 (H) 11.5 - 15.5 %   Platelets 181 150 - 400 K/uL   Neutrophils Relative % 50 43 - 77 %   Neutro Abs 3.6 1.7 - 7.7 K/uL   Lymphocytes Relative 41 12 - 46 %   Lymphs Abs 2.9 0.7 - 4.0 K/uL   Monocytes Relative 7 3 - 12 %   Monocytes Absolute 0.5 0.1 - 1.0 K/uL   Eosinophils Relative 1 0 - 5 %   Eosinophils Absolute 0.1 0.0 - 0.7 K/uL   Basophils Relative 1 0 - 1 %   Basophils Absolute 0.1 0.0 - 0.1 K/uL  Comprehensive metabolic panel     Status: Abnormal   Collection Time: 09/27/14  7:30 PM  Result Value Ref Range   Sodium 142 135 - 145 mmol/L   Potassium 4.1 3.5 - 5.1 mmol/L   Chloride 104 96 - 112 mmol/L   CO2 29 19 - 32 mmol/L   Glucose, Bld 96 70 - 99 mg/dL   BUN 17 6 - 23 mg/dL   Creatinine, Ser 0.94 0.50 - 1.10 mg/dL   Calcium 9.2 8.4 - 10.5 mg/dL   Total Protein 7.0 6.0 - 8.3 g/dL   Albumin 3.8 3.5 - 5.2 g/dL   AST 19 0 - 37 U/L   ALT 15 0 - 35 U/L   Alkaline Phosphatase 76 39 - 117 U/L   Total Bilirubin 0.5 0.3 - 1.2 mg/dL   GFR calc non Af Amer 59 (L) >90 mL/min   GFR calc Af Amer 68 (L) >90 mL/min    Comment: (NOTE) The eGFR has been calculated using the CKD EPI equation. This calculation has not been validated in all clinical situations. eGFR's persistently <90 mL/min signify possible Chronic Kidney Disease.    Anion gap 9 5 - 15  Protime-INR     Status: None   Collection Time: 09/27/14  7:30 PM  Result Value Ref Range   Prothrombin Time 13.3 11.6 - 15.2 seconds   INR 1.00 0.00 - 1.49  Troponin I     Status: None   Collection Time: 09/27/14  7:30 PM  Result Value Ref Range   Troponin  I <0.03 <0.031 ng/mL    Comment:        NO INDICATION OF MYOCARDIAL INJURY.    I-Stat CG4 Lactic Acid, ED     Status: None   Collection Time: 09/27/14  7:36 PM  Result Value Ref Range   Lactic Acid, Venous 0.81 0.5 - 2.0 mmol/L   Ct Abdomen Pelvis Wo Contrast  09/27/2014   CLINICAL DATA:  Rectal bleeding.  EXAM: CT ABDOMEN AND PELVIS WITHOUT CONTRAST  TECHNIQUE: Multidetector CT imaging of the abdomen and pelvis was performed following the standard protocol without IV contrast.  COMPARISON:  CT scan of July 09, 2014.  FINDINGS: Multilevel degenerative disc disease is noted in the lumbar spine. Visualized lung bases appear normal.  Status post cholecystectomy. No focal abnormality is noted in the liver, spleen or pancreas on these unenhanced images. Adrenal glands and kidneys appear normal. No renal or ureteral calculi are noted. There is no evidence of bowel obstruction. Atherosclerotic calcifications of abdominal aorta are noted without aneurysm formation. Sigmoid diverticulosis is noted. Focal sigmoid diverticulitis is noted. No abnormal fluid collection is noted. Stable fat containing ventral hernia is noted in the pelvis. Urinary bladder appears normal. No significant adenopathy is noted.  IMPRESSION: Focal sigmoid diverticulitis is noted. Stable ventral hernia noted anteriorly in the pelvis. No abnormal fluid collection or abscess is seen at this time.   Electronically Signed   By: Marijo Conception, M.D.   On: 09/27/2014 21:10    Review of Systems  Constitutional: Negative.   Eyes: Negative.   Respiratory: Negative.   Cardiovascular: Negative.   Gastrointestinal: Positive for nausea, abdominal pain and blood in stool. Negative for heartburn, vomiting, diarrhea, constipation and melena.  Genitourinary: Negative.   Musculoskeletal: Positive for neck pain. Negative for myalgias, back pain, joint pain and falls.       Chronic neck pain x 59month  Skin: Negative.   Neurological: Negative.   Endo/Heme/Allergies: Negative.   Psychiatric/Behavioral: Negative.     Blood  pressure 160/73, pulse 96, temperature 98.4 F (36.9 C), temperature source Oral, resp. rate 18, SpO2 100 %. Physical Exam  Constitutional: She is oriented to person, place, and time. She appears well-developed and well-nourished.  HENT:  Head: Normocephalic and atraumatic.  Eyes: Conjunctivae and EOM are normal. Pupils are equal, round, and reactive to light. No scleral icterus.  Neck: Normal range of motion. Neck supple. No JVD present. No tracheal deviation present. No thyromegaly present.  Cardiovascular: Normal rate and regular rhythm.  Exam reveals no gallop and no friction rub.   No murmur heard. Respiratory: Effort normal and breath sounds normal. No respiratory distress. She has no wheezes. She has no rales.  GI: Soft. Bowel sounds are normal. She exhibits no distension. There is no tenderness. There is no rebound and no guarding.  Musculoskeletal: Normal range of motion. She exhibits no edema or tenderness.  Lymphadenopathy:    She has no cervical adenopathy.  Neurological: She is alert and oriented to person, place, and time. She has normal reflexes. She displays normal reflexes. No cranial nerve deficit. She exhibits normal muscle tone. Coordination normal.  Skin: Skin is warm and dry. No rash noted. No erythema. No pallor.  Psychiatric: She has a normal mood and affect. Her behavior is normal. Judgment and thought content normal.     Assessment/Plan Rectal bleeding NPO  Ns iv Type and screen Check cbc in am, check cbc at 11 am Please consult GI in am  Diverticulitis cipro iv,  flagyl iv  Pafib Hold aspirin for now due to rectal bleeding  Anemia Check cbc in am  Gerd protonix   DVT prophylaxis scd    Jani Gravel 09/27/2014, 10:49 PM

## 2014-09-27 NOTE — ED Provider Notes (Signed)
CSN: [id number]     Arrival date & time 09/27/14  1814 History   First MD Initiated Contact with Patient 09/27/14 1836     Chief Complaint  Patient presents with  . Rectal Bleeding     (Consider location/radiation/quality/duration/timing/severity/associated sxs/prior Treatment) HPI Comments: Patient complains of rectal bleeding onset today. She reports having 3 bowel movements that were brown and soft but noticed red on the toilet paper) the toilet bowl and red around the stool. Patient admits to straining on the toilet yesterday and was feeling constipated but it was not straining today. Denies any nausea or vomiting. Denies any fever. Denies any chest pain or shortness of breath. She endorses some diffuse abdominal pain. She is on aspirin but no other anticoagulation. She denies any dizziness or lightheadedness.  Patient is a 74 y.o. female presenting with hematochezia. The history is provided by the patient.  Rectal Bleeding Associated symptoms: abdominal pain and vomiting   Associated symptoms: no dizziness and no fever     Past Medical History  Diagnosis Date  . Hypertension   . Anxiety and depression   . Asthma   . Fibromyalgia   . History of pneumonia   . Peripheral edema   . Peptic ulcer disease   . Hiatal hernia   . GERD (gastroesophageal reflux disease)   . A-fib     cardioversion and TEE at [facility]; pt reports DCCV 2014 at [facility] as well.  . 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 Diverticular disease 10/01/2011  . MI, acute, non ST segment elevation 10/02/2011  . GI bleed     ?recurrent--therefore, no anticoagulant  . COPD (chronic obstructive pulmonary disease)   . Lymphedema   . CAD (coronary artery disease) 02/2013    2.75 x 32 mm Rebel bare metal stent in the distal RCA.    Past Surgical History  Procedure Laterality Date  . Abdominal hysterectomy  age 27    nonmalignant reason  . Cholecystectomy    . Abdominal exploration surgery    . Abd tumor  removed      states was 10 lbs, benign  . Knee surgery    . Bladder stent    . Esophagogastroduodenoscopy  08/24/2011    Procedure: ESOPHAGOGASTRODUODENOSCOPY (EGD);  Surgeon: Daneil Dolin, MD;  Location: [facility];  Service: Endoscopy;  Laterality: N/A;  give phenergan 12.5mg  iv 30 mins prior to procedure  . Colonoscopy  08/25/2011    Procedure: COLONOSCOPY;  Surgeon: Daneil Dolin, MD;  Location: [facility];  Service: Endoscopy;  Laterality: N/A;  . Colonoscopy  10/03/2011    Procedure: COLONOSCOPY;  Surgeon: Daneil Dolin, MD;  Location: [facility];  Service: Endoscopy;  Laterality: N/A;  NEEDS PHENERGAN 25 MG IV ON CALL  . Colonoscopy  10/04/2011    Procedure: COLONOSCOPY;  Surgeon: Daneil Dolin, MD;  Location: [facility];  Service: Endoscopy;  Laterality: N/A;  Phenergan 12.5 mg ON CALL  . Transthoracic echocardiogram  09/2011    EF 60-65%, septal hypokinesia  . Cardiovascular stress test  09/2011    equivocal result, most likely low risk; pt refused the recommended cardiac cath to follow this up.  . Coronary angioplasty  02/2013  . Percutaneous coronary stent intervention (pci-s)  02/2013    2.75 x 32 mm Rebel bare metal stent in the distal RCA.   [name] Left and right heart catheterization with coronary angiogram N/A 03/03/2013    Procedure: LEFT AND RIGHT HEART CATHETERIZATION  WITH CORONARY ANGIOGRAM;  Surgeon: Burnell Blanks, MD;  Location: [facility] CATH LAB;  Service: Cardiovascular;  Laterality: N/A;   Family History  Problem Relation Age of Onset  . Diabetes Mother     Deceased  . Hypertension Mother   . Cancer Father     Deceased  . Colon cancer Neg Hx   . Coronary artery disease Mother   . Heart failure Mother    History  Substance Use Topics  . Smoking status: Former Smoker -- 1.00 packs/day for 15 years    Types: Cigarettes    Quit date: 06/26/1995  . Smokeless tobacco: Never Used  . Alcohol Use: No   OB History    Gravida Para Term Preterm AB TAB SAB  Ectopic Multiple Living   3 3 3       3      Review of Systems  Constitutional: Positive for activity change and appetite change. Negative for fever.  HENT: Negative for congestion and rhinorrhea.   Eyes: Negative for visual disturbance.  Respiratory: Negative for cough, chest tightness and shortness of breath.   Cardiovascular: Negative for chest pain.  Gastrointestinal: Positive for nausea, vomiting, abdominal pain, hematochezia and anal bleeding.  Genitourinary: Negative for dysuria and hematuria.  Musculoskeletal: Negative for myalgias and arthralgias.  Skin: Negative for rash.  Neurological: Negative for dizziness, weakness and headaches.  A complete 10 system review of systems was obtained and all systems are negative except as noted in the HPI and PMH.      Allergies  Iohexol; Dye fdc red; Sulfa antibiotics; and Codeine  Home Medications   Prior to Admission medications   Medication Sig Start Date End Date Taking? Authorizing Provider  ALPRAZolam Duanne Moron) 0.5 MG tablet Take 0.5 mg by mouth at bedtime as needed for anxiety.   Yes Historical Provider, MD  aspirin EC 81 MG tablet Take 1 tablet (81 mg total) by mouth daily. 06/16/13  Yes Patrecia Pour, NP  FERREX 150 150 MG capsule TAKE ONE CAPSULE BY MOUTH ONCE DAILY 07/21/14  Yes Lysbeth Penner, FNP  gabapentin (NEURONTIN) 400 MG capsule TAKE ONE CAPSULE BY MOUTH 4 TIMES DAILY 08/23/14  Yes Chipper Herb, MD  lisinopril (PRINIVIL,ZESTRIL) 40 MG tablet Take 1 tablet (40 mg total) by mouth daily. 07/08/14  Yes Lysbeth Penner, FNP  metoprolol tartrate (LOPRESSOR) 25 MG tablet Take 1 tablet (25 mg total) by mouth 2 (two) times daily. Patient taking differently: Take 25 mg by mouth daily.  07/08/14  Yes Lysbeth Penner, FNP  nitroGLYCERIN (NITROSTAT) 0.4 MG SL tablet Place 1 tablet (0.4 mg total) under the tongue every 5 (five) minutes as needed for chest pain. 10/21/13  Yes Mary-Margaret Hassell Done, FNP  pantoprazole (PROTONIX) 40 MG  tablet Take 1 tablet (40 mg total) by mouth daily. 07/27/14  Yes Arnoldo Lenis, MD  pravastatin (PRAVACHOL) 80 MG tablet Take 1 tablet (80 mg total) by mouth daily. 08/12/14  Yes Mary-Margaret Hassell Done, FNP  SYNTHROID 50 MCG tablet 1 po every other day Patient taking differently: Take 50 mcg by mouth every other day.  07/29/14  Yes Mary-Margaret Hassell Done, FNP  tetrahydrozoline 0.05 % ophthalmic solution Place 1 drop into both eyes 2 (two) times daily.   Yes Historical Provider, MD  torsemide (DEMADEX) 20 MG tablet Take 20 mg by mouth daily.   Yes Historical Provider, MD  ALPRAZolam Duanne Moron) 1 MG tablet TAKE ONE TABLET BY MOUTH 4 TIMES DAILY 09/09/14   Mary-Margaret Hassell Done, FNP  estrogens, conjugated, (PREMARIN) 1.25 MG tablet Take 1 tablet (1.25 mg total) by mouth daily. 07/29/14   Mary-Margaret Hassell Done, FNP  sertraline (ZOLOFT) 50 MG tablet Take 1 tablet (50 mg total) by mouth daily. 07/29/14   Mary-Margaret Hassell Done, FNP   BP 194/83 mmHg  Pulse 79  Temp(Src) 97.8 F (36.6 C) (Oral)  Resp 18  Ht 5' (1.524 m)  Wt 189 lb 13.1 oz (86.1 kg)  BMI 37.07 kg/m2  SpO2 97% Physical Exam  Constitutional: She is oriented to person, place, and time. She appears well-developed and well-nourished. No distress.  HENT:  Head: Normocephalic and atraumatic.  Mouth/Throat: Oropharynx is clear and moist. No oropharyngeal exudate.  Eyes: Conjunctivae and EOM are normal. Pupils are equal, round, and reactive to light.  Neck: Normal range of motion. Neck supple.  No meningismus.  Cardiovascular: Normal rate, regular rhythm, normal heart sounds and intact distal pulses.   No murmur heard. Pulmonary/Chest: Effort normal and breath sounds normal. No respiratory distress.  Abdominal: Soft. There is tenderness. There is no rebound and no guarding.  Diffuse abdominal tenderness  Musculoskeletal: Normal range of motion. She exhibits no edema or tenderness.  Neurological: She is alert and oriented to person, place, and time. No  cranial nerve deficit. She exhibits normal muscle tone. Coordination normal.  No ataxia on finger to nose bilaterally. No pronator drift. 5/5 strength throughout. CN 2-12 intact. Negative Romberg. Equal grip strength. Sensation intact. Gait is normal.   Skin: Skin is warm.  Psychiatric: She has a normal mood and affect. Her behavior is normal.  Nursing note and vitals reviewed.   ED Course  Procedures (including critical care time) Labs Review Labs Reviewed  CBC WITH DIFFERENTIAL/PLATELET - Abnormal; Notable for the following:    Hemoglobin 11.6 (*)    MCH 25.8 (*)    RDW 15.6 (*)    All other components within normal limits  COMPREHENSIVE METABOLIC PANEL - Abnormal; Notable for the following:    GFR calc non Af Amer 59 (*)    GFR calc Af Amer 68 (*)    All other components within normal limits  PROTIME-INR  TROPONIN I  CBC  COMPREHENSIVE METABOLIC PANEL  CBC  POC OCCULT BLOOD, ED  I-STAT CG4 LACTIC ACID, ED  I-STAT CG4 LACTIC ACID, ED  TYPE AND SCREEN    Imaging Review Ct Abdomen Pelvis Wo Contrast  09/27/2014   CLINICAL DATA:  Rectal bleeding.  EXAM: CT ABDOMEN AND PELVIS WITHOUT CONTRAST  TECHNIQUE: Multidetector CT imaging of the abdomen and pelvis was performed following the standard protocol without IV contrast.  COMPARISON:  CT scan of July 09, 2014.  FINDINGS: Multilevel degenerative disc disease is noted in the lumbar spine. Visualized lung bases appear normal.  Status post cholecystectomy. No focal abnormality is noted in the liver, spleen or pancreas on these unenhanced images. Adrenal glands and kidneys appear normal. No renal or ureteral calculi are noted. There is no evidence of bowel obstruction. Atherosclerotic calcifications of abdominal aorta are noted without aneurysm formation. Sigmoid diverticulosis is noted. Focal sigmoid diverticulitis is noted. No abnormal fluid collection is noted. Stable fat containing ventral hernia is noted in the pelvis. Urinary bladder  appears normal. No significant adenopathy is noted.  IMPRESSION: Focal sigmoid diverticulitis is noted. Stable ventral hernia noted anteriorly in the pelvis. No abnormal fluid collection or abscess is seen at this time.   Electronically Signed   By: Marijo Conception, M.D.   On: 09/27/2014 21:10  EKG Interpretation   Date/Time:  Monday September 27 2014 19:12:47 EDT Ventricular Rate:  77 PR Interval:  130 QRS Duration: 93 QT Interval:  447 QTC Calculation: 506 R Axis:   76 Text Interpretation:  Sinus rhythm Atrial premature complexes Prolonged QT  interval No significant change was found Confirmed by Wyvonnia Dusky  MD, Sarahlynn Cisnero  [id number]) on 09/27/2014 7:24:22 PM      MDM   Final diagnoses:  Rectal bleeding  Diverticulitis of small intestine without perforation or abscess without bleeding   Intermittent rectal bleeding throughout the day today. History of GI bleeding in the past. No chest pain or shortness of breath.  Lower abdominal tenderness worse on the left side. Hemoccult is positive. Hemoglobin is stable. Vitals are stable.  CT scan shows focal sigmoid diverticulitis. Will start Cipro and Flagyl. Patient with history of AVM remotely as well as diverticular disease. History of GERD and hiatal hernia PUD.  Her hemoglobin stable at this time.  vitals are also stable. given her history of recurrent GI bleeding and diverticulitis, we'll admit for observation. Discussed with Dr. Madie Reno, MD 09/28/14 [phone]

## 2014-09-27 NOTE — ED Notes (Signed)
MD at bedside. 

## 2014-09-27 NOTE — ED Notes (Signed)
Pt presents from home via EMS with c/o rectal bleeding. Pt reports she has had three bowel movements today, soft in nature, some blood in stool. Pt is alert and oriented per EMS. Pt reports that this has happened before but does not remember what they said was going on with her. Pt reports she did have a colonoscopy after the last episode but does not remember the results.

## 2014-09-27 NOTE — ED Notes (Signed)
RN Lattie Haw getting IV and labs at the moment

## 2014-09-27 NOTE — ED Notes (Signed)
Bed: WA21 Expected date:  Expected time:  Means of arrival:  Comments: ems 

## 2014-09-27 NOTE — ED Notes (Signed)
Admitting MD at bedside.

## 2014-09-28 DIAGNOSIS — K5713 Diverticulitis of small intestine without perforation or abscess with bleeding: Secondary | ICD-10-CM

## 2014-09-28 LAB — COMPREHENSIVE METABOLIC PANEL
ALBUMIN: 3.2 g/dL — AB (ref 3.5–5.2)
ALK PHOS: 63 U/L (ref 39–117)
ALT: 11 U/L (ref 0–35)
AST: 16 U/L (ref 0–37)
Anion gap: 7 (ref 5–15)
BILIRUBIN TOTAL: 0.6 mg/dL (ref 0.3–1.2)
BUN: 13 mg/dL (ref 6–23)
CHLORIDE: 105 mmol/L (ref 96–112)
CO2: 29 mmol/L (ref 19–32)
Calcium: 8.4 mg/dL (ref 8.4–10.5)
Creatinine, Ser: 0.84 mg/dL (ref 0.50–1.10)
GFR calc Af Amer: 78 mL/min — ABNORMAL LOW (ref >90)
GFR calc non Af Amer: 67 mL/min — ABNORMAL LOW (ref >90)
Glucose, Bld: 98 mg/dL (ref 70–99)
POTASSIUM: 3 mmol/L — AB (ref 3.5–5.1)
SODIUM: 141 mmol/L (ref 135–145)
TOTAL PROTEIN: 5.8 g/dL — AB (ref 6.0–8.3)

## 2014-09-28 LAB — CBC
HEMATOCRIT: 34.3 % — AB (ref 36.0–46.0)
HEMATOCRIT: 35.3 % — AB (ref 36.0–46.0)
Hemoglobin: 10.5 g/dL — ABNORMAL LOW (ref 12.0–15.0)
Hemoglobin: 10.9 g/dL — ABNORMAL LOW (ref 12.0–15.0)
MCH: 25.9 pg — ABNORMAL LOW (ref 26.0–34.0)
MCH: 26 pg (ref 26.0–34.0)
MCHC: 30.6 g/dL (ref 30.0–36.0)
MCHC: 30.9 g/dL (ref 30.0–36.0)
MCV: 84.5 fL (ref 78.0–100.0)
MCV: 84.2 fL (ref 78.0–100.0)
PLATELETS: 171 10*3/uL (ref 150–400)
Platelets: 169 10*3/uL (ref 150–400)
RBC: 4.06 MIL/uL (ref 3.87–5.11)
RBC: 4.19 MIL/uL (ref 3.87–5.11)
RDW: 15.8 % — AB (ref 11.5–15.5)
RDW: 15.7 % — ABNORMAL HIGH (ref 11.5–15.5)
WBC: 4.5 10*3/uL (ref 4.0–10.5)
WBC: 3.9 10*3/uL — ABNORMAL LOW (ref 4.0–10.5)

## 2014-09-28 LAB — ABO/RH: ABO/RH(D): B POS

## 2014-09-28 LAB — TYPE AND SCREEN
ABO/RH(D): B POS
Antibody Screen: NEGATIVE

## 2014-09-28 MED ORDER — HYDROCORTISONE ACETATE 25 MG RE SUPP
25.0000 mg | Freq: Two times a day (BID) | RECTAL | Status: DC
  Filled 2014-09-28 (×4): qty 1

## 2014-09-28 MED ORDER — DOCUSATE SODIUM 100 MG PO CAPS: 100.0000 mg | ORAL_CAPSULE | Freq: Every day | ORAL | Status: DC

## 2014-09-28 MED ORDER — POTASSIUM CHLORIDE 10 MEQ/100ML IV SOLN
10.0000 meq | INTRAVENOUS | Status: AC
  Administered 2014-09-28 (×4): 10 meq via INTRAVENOUS
  Filled 2014-09-28 (×4): qty 100

## 2014-09-28 MED ORDER — DIPHENHYDRAMINE HCL 50 MG PO CAPS
50.0000 mg | ORAL_CAPSULE | Freq: Once | ORAL | Status: AC
  Administered 2014-09-28: 50 mg via ORAL
  Filled 2014-09-28: qty 1

## 2014-09-28 NOTE — Progress Notes (Signed)
[facility] PROGRESS NOTE  Sharon Compton WPY:[id number] DOB: Sep 01, 1940 DOA: 09/27/2014 PCP: Chevis Pretty, FNP  Assessment/Plan: 1-Hematochezia;  Might be related to diverticulitis, Vs AVM.  IV Protonix. Treating diverticulitis.  GI consulted.  Monitor hb.   2-Diverticulitis;  CT ; focal sigmoid diverticulitis.  Continue with Cipro and flagyl.  Start Clear diet.    3-Paroxysmal A fib:  Hold aspirin due to GI bleed.  Continue with metoprolol.   4-HTN; hold cozaar in setting GI bleed. Continue with metoprolol to avoid A fib RVR.  Continue with torsemide, follow renal function. Of notes, patient relates she doesn't take this medication every day.   5-GERD; continue with Protonix.    Code Status: Full Code.  Family Communication: Care discussed with patient.  Disposition Plan: Remain inpatient for treatment of diverticulitis.    Consultants:  GI  Procedures:  none  Antibiotics:  Ciprofloxacin 4-4  Flagyl 4-4  HPI/Subjective: She is feeling better, pain is better 3/10. No diarrhea, no blood stool today./    Objective: Filed Vitals:   09/28/14 0551  BP: 161/78  Pulse: 72  Temp: 97.4 F (36.3 C)  Resp: 18    Intake/Output Summary (Last 24 hours) at 09/28/14 0942 Last data filed at 09/28/14 0933  Gross per 24 hour  Intake    550 ml  Output      0 ml  Net    550 ml   Filed Weights   09/28/14 0005  Weight: 86.1 kg (189 lb 13.1 oz)    Exam:   General:  Alert in no distress.   Cardiovascular: S 1, S 2 RRR  Respiratory: CTA  Abdomen: Bs present, soft, mild left Lower quadrant tenderness.   Musculoskeletal: trace edema, redness skin.   Data Reviewed: Basic Metabolic Panel:  Recent Labs Lab 09/27/14 1930 09/28/14 0510  NA 142 141  K 4.1 3.0*  CL 104 105  CO2 29 29  GLUCOSE 96 98  BUN 17 13  CREATININE 0.94 0.84  CALCIUM 9.2 8.4   Liver Function Tests:  Recent Labs Lab 09/27/14 1930 09/28/14 0510  AST 19 16  ALT  15 11  ALKPHOS 76 63  BILITOT 0.5 0.6  PROT 7.0 5.8*  ALBUMIN 3.8 3.2*   No results for input(s): LIPASE, AMYLASE in the last 168 hours. No results for input(s): AMMONIA in the last 168 hours. CBC:  Recent Labs Lab 09/27/14 1930 09/28/14 0510  WBC 7.1 4.5  NEUTROABS 3.6  --   HGB 11.6* 10.5*  HCT 37.6 34.3*  MCV 83.6 84.5  PLT 181 169   Cardiac Enzymes:  Recent Labs Lab 09/27/14 1930  TROPONINI <0.03   BNP (last 3 results) No results for input(s): BNP in the last 8760 hours.  ProBNP (last 3 results) No results for input(s): PROBNP in the last 8760 hours.  CBG: No results for input(s): GLUCAP in the last 168 hours.  No results found for this or any previous visit (from the past 240 hour(s)).   Studies: Ct Abdomen Pelvis Wo Contrast  09/27/2014   CLINICAL DATA:  Rectal bleeding.  EXAM: CT ABDOMEN AND PELVIS WITHOUT CONTRAST  TECHNIQUE: Multidetector CT imaging of the abdomen and pelvis was performed following the standard protocol without IV contrast.  COMPARISON:  CT scan of July 09, 2014.  FINDINGS: Multilevel degenerative disc disease is noted in the lumbar spine. Visualized lung bases appear normal.  Status post cholecystectomy. No focal abnormality is noted in the liver, spleen or pancreas on these  unenhanced images. Adrenal glands and kidneys appear normal. No renal or ureteral calculi are noted. There is no evidence of bowel obstruction. Atherosclerotic calcifications of abdominal aorta are noted without aneurysm formation. Sigmoid diverticulosis is noted. Focal sigmoid diverticulitis is noted. No abnormal fluid collection is noted. Stable fat containing ventral hernia is noted in the pelvis. Urinary bladder appears normal. No significant adenopathy is noted.  IMPRESSION: Focal sigmoid diverticulitis is noted. Stable ventral hernia noted anteriorly in the pelvis. No abnormal fluid collection or abscess is seen at this time.   Electronically Signed   By: Marijo Conception, M.D.   On: 09/27/2014 21:10    Scheduled Meds: . ciprofloxacin  400 mg Intravenous Q12H  . gabapentin  400 mg Oral TID  . iron polysaccharides  150 mg Oral Daily  . levothyroxine  50 mcg Oral Q48H  . metoprolol tartrate  25 mg Oral BID  . metronidazole  500 mg Intravenous Q8H  . pantoprazole (PROTONIX) IV  40 mg Intravenous Q12H  . potassium chloride  10 mEq Intravenous Q1 Hr x 4  . pravastatin  80 mg Oral q1800  . sodium chloride  3 mL Intravenous Q12H  . torsemide  20 mg Oral Daily   Continuous Infusions: . sodium chloride 75 mL/hr at 09/28/14 0029    Active Problems:   GI bleeding   Diverticulitis   Rectal bleeding    Time spent: 35 minutes.     Niel Hummer A  [facility] Pager [phone]. If 7PM-7AM, please contact night-coverage at [email or web address], password [email or web address] 09/28/2014, 9:42 AM  LOS: 1 day

## 2014-09-28 NOTE — Progress Notes (Signed)
UR complete 
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
## 2014-09-29 DIAGNOSIS — F32A Depression, unspecified: Secondary | ICD-10-CM | POA: Insufficient documentation

## 2014-09-29 DIAGNOSIS — K219 Gastro-esophageal reflux disease without esophagitis: Secondary | ICD-10-CM

## 2014-09-29 DIAGNOSIS — I10 Essential (primary) hypertension: Secondary | ICD-10-CM | POA: Insufficient documentation

## 2014-09-29 DIAGNOSIS — F418 Other specified anxiety disorders: Secondary | ICD-10-CM

## 2014-09-29 DIAGNOSIS — F329 Major depressive disorder, single episode, unspecified: Secondary | ICD-10-CM | POA: Insufficient documentation

## 2014-09-29 DIAGNOSIS — K5733 Diverticulitis of large intestine without perforation or abscess with bleeding: Secondary | ICD-10-CM

## 2014-09-29 LAB — CBC
HCT: 34 % — ABNORMAL LOW (ref 36.0–46.0)
Hemoglobin: 10.4 g/dL — ABNORMAL LOW (ref 12.0–15.0)
MCH: 26.1 pg (ref 26.0–34.0)
MCHC: 30.6 g/dL (ref 30.0–36.0)
MCV: 85.4 fL (ref 78.0–100.0)
PLATELETS: 181 10*3/uL (ref 150–400)
RBC: 3.98 MIL/uL (ref 3.87–5.11)
RDW: 16 % — AB (ref 11.5–15.5)
WBC: 4.4 10*3/uL (ref 4.0–10.5)

## 2014-09-29 LAB — BASIC METABOLIC PANEL
Anion gap: 7 (ref 5–15)
BUN: 12 mg/dL (ref 6–23)
CO2: 29 mmol/L (ref 19–32)
Calcium: 8.5 mg/dL (ref 8.4–10.5)
Chloride: 107 mmol/L (ref 96–112)
Creatinine, Ser: 0.91 mg/dL (ref 0.50–1.10)
GFR, EST AFRICAN AMERICAN: 71 mL/min — AB (ref >90)
GFR, EST NON AFRICAN AMERICAN: 61 mL/min — AB (ref >90)
Glucose, Bld: 105 mg/dL — ABNORMAL HIGH (ref 70–99)
POTASSIUM: 3.4 mmol/L — AB (ref 3.5–5.1)
Sodium: 143 mmol/L (ref 135–145)

## 2014-09-29 MED ORDER — CIPROFLOXACIN HCL 500 MG PO TABS: 500.0000 mg | ORAL_TABLET | Freq: Two times a day (BID) | ORAL | Status: DC

## 2014-09-29 MED ORDER — METRONIDAZOLE 500 MG PO TABS: 500.0000 mg | ORAL_TABLET | Freq: Three times a day (TID) | ORAL | Status: DC

## 2014-09-29 MED ORDER — HYDROCORTISONE ACETATE 25 MG RE SUPP: 25.0000 mg | Freq: Two times a day (BID) | RECTAL | Status: DC

## 2014-09-29 MED ORDER — POLYETHYLENE GLYCOL 3350 17 G PO PACK: 17.0000 g | PACK | Freq: Every day | ORAL | Status: DC

## 2014-09-29 MED ORDER — ASPIRIN EC 81 MG PO TBEC: 81.0000 mg | DELAYED_RELEASE_TABLET | Freq: Every day | ORAL | Status: DC

## 2014-09-29 NOTE — Progress Notes (Signed)
Patient given discharge instructions, and verbalized an understanding of all discharge instructions.  Patient agrees with discharge plan, and is being discharged in stable medical condition.  Patient assisted to transportation([employer]).  Durwin Nora RN

## 2014-09-29 NOTE — Discharge Summary (Signed)
Physician Discharge Summary  Sharon Compton:[id number] DOB: 30-Jun-1940 DOA: 09/27/2014  PCP: Chevis Pretty, FNP  Admit date: 09/27/2014 Discharge date: 09/29/2014  Time spent: >30 minutes  Recommendations for Outpatient Follow-up:  1. Repeat CBC to follow Hgb trend 2. Check BMET to follow electrolytes and renal function 3. Reassess BP and adjust antihypertensive regimen as needed   Discharge Diagnoses:  Lower GI bleeding Diverticulitis HTN PAF GERD Chronic diastolic heart failure  Depression/anxiety  Chronic anemia (with iron deficiency component)  Discharge Condition: stable and improved. Discharge home with instructions to follow with PCP in 10 days  Diet recommendation: heart healthy/low residue diet  Filed Weights   09/28/14 0005 09/29/14 0500  Weight: 86.1 kg (189 lb 13.1 oz) 86.3 kg (190 lb 4.1 oz)    History of present illness:  74 yo female with hypertension, cad s/p MI, diverticulosis, avm c/o rectal bleeding starting this am. When she looked into the commode had evidence of blood in toilet, and also on the toilet paper. Pt had 3 bm like this . Sharp pain just below belly button. Pt denies fever, chills, N/v, diarrhea. Pt has dark stool but takes iron. Pt denies nsaid use. Pt is on aspirin. Pt has hx of gi bleeding. Pt was seen in ED, and found on CT scan to have diverticulitis.  Hospital Course:  1-Hematochezia/BRBPR  -Might be related to diverticulitis Vs internal hemorrhoids  -continue tx for diverticulitis.  -per GI rec's will add Anusol for 3 days and recommended the use of stool softner -patient w/o any further episodes of bleeding and stable Hgb -will follow with regular GI provider for further evaluation and treatment as needed  2-Diverticulitis  -CT ; focal sigmoid diverticulitis.  -Continue with Cipro and flagyl to complete therapy  -patient instructed to follow low residue diet   3-Paroxysmal A fib:  -Sinus rhythm and rate  controlled -Continue with metoprolol.  -Aspirin will be held for 1 week due to acute BRBPR; then would be resumed  4-HTN;  -will continue home antihypertensive regimen and advise to follow low sodium diet -encourage to be compliant with medications and to take them on daily basis as indicated  5-GERD; continue with Protonix.   6-chronic diastolic heart failure: grade from Echo 10/08/13; preserved EF (60-65%) -advise to follow low sodium diet -continue use of torsemide  -daily weights and follow up with cardiology as prior to admission instructed -condition was compensated and stable during this admission  7-depression/anxiety: continue zoloft and PRN xanax  8-chronic anemia (with low iron): slightly worse with new BRBPR -continue iron supplementation -no transfusion needed   Procedures:  See below for x-ray reports   Consultations:  GI   Discharge Exam: Filed Vitals:   09/29/14 0459  BP: 132/60  Pulse: 69  Temp: 97.7 F (36.5 C)  Resp: 16    General: AAOX3; no fever, no nausea, no vomiting and in no distress.   Cardiovascular: S 1, S 2 RRR  Respiratory: CTA bilaterally  Abdomen: Bs present, soft, mild left Lower quadrant tenderness with deep palpation    Musculoskeletal: trace to 1+ edema bilaterally (no new and per patient unchanged from prior to admission)   Discharge Instructions   Discharge Instructions    Diet - low sodium heart healthy    Complete by:  As directed      Discharge instructions    Complete by:  As directed   Take medications as prescribed Follow a low sodium diet (less than 2 gram of sodium  daily) Maintain adequate hydration (remember nothing is better than water) Arrange follow up with PCP in 10 days Check your weight on daily basis  Follow a low residue diet (to help decreasing chances of future diverticulitis)          Current Discharge Medication List    START taking these medications   Details  ciprofloxacin (CIPRO)  500 MG tablet Take 1 tablet (500 mg total) by mouth 2 (two) times daily. Qty: 14 tablet, Refills: 0    hydrocortisone (ANUSOL-HC) 25 MG suppository Place 1 suppository (25 mg total) rectally 2 (two) times daily. USE FOR 3 DAYS Qty: 6 suppository, Refills: 0    metroNIDAZOLE (FLAGYL) 500 MG tablet Take 1 tablet (500 mg total) by mouth 3 (three) times daily. Qty: 21 tablet, Refills: 0    polyethylene glycol (MIRALAX / GLYCOLAX) packet Take 17 g by mouth daily. Qty: 30 each, Refills: 0      CONTINUE these medications which have CHANGED   Details  aspirin EC 81 MG tablet Take 1 tablet (81 mg total) by mouth daily. HOLD ASPIRIN FOR 1 WEEK GIVEN BLEEDING; THEN OK TO RESUME      CONTINUE these medications which have NOT CHANGED   Details  FERREX 150 150 MG capsule TAKE ONE CAPSULE BY MOUTH ONCE DAILY Qty: 30 capsule, Refills: 5    gabapentin (NEURONTIN) 400 MG capsule TAKE ONE CAPSULE BY MOUTH 4 TIMES DAILY Qty: 120 capsule, Refills: 2    lisinopril (PRINIVIL,ZESTRIL) 40 MG tablet Take 1 tablet (40 mg total) by mouth daily. Qty: 90 tablet, Refills: 1   Associated Diagnoses: HTN (hypertension), benign    metoprolol tartrate (LOPRESSOR) 25 MG tablet Take 1 tablet (25 mg total) by mouth 2 (two) times daily. Qty: 180 tablet, Refills: 1   Associated Diagnoses: HTN (hypertension), benign    nitroGLYCERIN (NITROSTAT) 0.4 MG SL tablet Place 1 tablet (0.4 mg total) under the tongue every 5 (five) minutes as needed for chest pain. Qty: 30 tablet, Refills: 0   Associated Diagnoses: Hx of right coronary artery stent placement    pantoprazole (PROTONIX) 40 MG tablet Take 1 tablet (40 mg total) by mouth daily. Qty: 90 tablet, Refills: 1   Associated Diagnoses: Gastroesophageal reflux disease, esophagitis presence not specified    pravastatin (PRAVACHOL) 80 MG tablet Take 1 tablet (80 mg total) by mouth daily. Qty: 90 tablet, Refills: 1    SYNTHROID 50 MCG tablet 1 po every other day Qty: 90  tablet, Refills: 1   Associated Diagnoses: Hypothyroidism, unspecified hypothyroidism type    tetrahydrozoline 0.05 % ophthalmic solution Place 1 drop into both eyes 2 (two) times daily.    torsemide (DEMADEX) 20 MG tablet Take 20 mg by mouth daily.    ALPRAZolam (XANAX) 1 MG tablet TAKE ONE TABLET BY MOUTH 4 TIMES DAILY Qty: 120 tablet, Refills: 1    estrogens, conjugated, (PREMARIN) 1.25 MG tablet Take 1 tablet (1.25 mg total) by mouth daily. Qty: 30 tablet, Refills: 5   Associated Diagnoses: Menopausal hot flushes    sertraline (ZOLOFT) 50 MG tablet Take 1 tablet (50 mg total) by mouth daily. Qty: 30 tablet, Refills: 3   Associated Diagnoses: Generalized anxiety disorder       Allergies  Allergen Reactions  . Iohexol Shortness Of Breath    PT STATES CONTRAST ALLERGY CAUSED SHORTNESS OF BREATH IN THE 70'S  . Dye Fdc Red [Red Dye]   . Sulfa Antibiotics Itching  . Codeine Itching and Palpitations  Follow-up Information    Follow up with Chevis Pretty, FNP. Schedule an appointment as soon as possible for a visit in 10 days.   Specialty:  Nurse Practitioner   Contact information:   [place] [phone]       The results of significant diagnostics from this hospitalization (including imaging, microbiology, ancillary and laboratory) are listed below for reference.    Significant Diagnostic Studies: Ct Abdomen Pelvis Wo Contrast  09/27/2014   CLINICAL DATA:  Rectal bleeding.  EXAM: CT ABDOMEN AND PELVIS WITHOUT CONTRAST  TECHNIQUE: Multidetector CT imaging of the abdomen and pelvis was performed following the standard protocol without IV contrast.  COMPARISON:  CT scan of July 09, 2014.  FINDINGS: Multilevel degenerative disc disease is noted in the lumbar spine. Visualized lung bases appear normal.  Status post cholecystectomy. No focal abnormality is noted in the liver, spleen or pancreas on these unenhanced images. Adrenal glands and  kidneys appear normal. No renal or ureteral calculi are noted. There is no evidence of bowel obstruction. Atherosclerotic calcifications of abdominal aorta are noted without aneurysm formation. Sigmoid diverticulosis is noted. Focal sigmoid diverticulitis is noted. No abnormal fluid collection is noted. Stable fat containing ventral hernia is noted in the pelvis. Urinary bladder appears normal. No significant adenopathy is noted.  IMPRESSION: Focal sigmoid diverticulitis is noted. Stable ventral hernia noted anteriorly in the pelvis. No abnormal fluid collection or abscess is seen at this time.   Electronically Signed   By: Marijo Conception, M.D.   On: 09/27/2014 21:10   Labs: Basic Metabolic Panel:  Recent Labs Lab 09/27/14 1930 09/28/14 0510 09/29/14 0530  NA 142 141 143  K 4.1 3.0* 3.4*  CL 104 105 107  CO2 29 29 29   GLUCOSE 96 98 105*  BUN 17 13 12   CREATININE 0.94 0.84 0.91  CALCIUM 9.2 8.4 8.5   Liver Function Tests:  Recent Labs Lab 09/27/14 1930 09/28/14 0510  AST 19 16  ALT 15 11  ALKPHOS 76 63  BILITOT 0.5 0.6  PROT 7.0 5.8*  ALBUMIN 3.8 3.2*   CBC:  Recent Labs Lab 09/27/14 1930 09/28/14 0510 09/28/14 1038 09/29/14 0530  WBC 7.1 4.5 3.9* 4.4  NEUTROABS 3.6  --   --   --   HGB 11.6* 10.5* 10.9* 10.4*  HCT 37.6 34.3* 35.3* 34.0*  MCV 83.6 84.5 84.2 85.4  PLT 181 169 171 181   Cardiac Enzymes:  Recent Labs Lab 09/27/14 1930  TROPONINI <0.03   Signed:  Barton Dubois  [facility] 09/29/2014, 12:10 PM

## 2014-09-29 NOTE — Progress Notes (Signed)
    Progress Note   Subjective  Finally got some sleep last night, reports insomnia. No further bleeding.  No abdominal pain.    Objective   Vital signs in last 24 hours: Temp:  [97.7 F (36.5 C)-98.1 F (36.7 C)] 97.7 F (36.5 C) (04/06 0459) Pulse Rate:  [69-81] 69 (04/06 0459) Resp:  [16-20] 16 (04/06 0459) BP: (132-177)/(60-84) 132/60 mmHg (04/06 0459) SpO2:  [93 %-95 %] 93 % (04/06 0459) Weight:  [190 lb 4.1 oz (86.3 kg)] 190 lb 4.1 oz (86.3 kg) (04/06 0500) Last BM Date: 09/28/14 General:    Pleasant white female in NAD Heart:  Regular rate and rhythm Lungs: Respirations even and unlabored, lungs CTA bilaterally Abdomen:  Soft, obese, nontender. Normal bowel sounds. Neurologic:  Alert and oriented,  grossly normal neurologically. Psych:  Cooperative. Normal mood and affect.  Lab Results:  Recent Labs  09/28/14 0510 09/28/14 1038 09/29/14 0530  WBC 4.5 3.9* 4.4  HGB 10.5* 10.9* 10.4*  HCT 34.3* 35.3* 34.0*  PLT 169 171 181   BMET  Recent Labs  09/27/14 1930 09/28/14 0510 09/29/14 0530  NA 142 141 143  K 4.1 3.0* 3.4*  CL 104 105 107  CO2 29 29 29   GLUCOSE 96 98 105*  BUN 17 13 12   CREATININE 0.94 0.84 0.91  CALCIUM 9.2 8.4 8.5   LFT  Recent Labs  09/28/14 0510  PROT 5.8*  ALBUMIN 3.2*  AST 16  ALT 11  ALKPHOS 63  BILITOT 0.6      Assessment / Plan:    100. 74 year old female with blood in stool. Hemorrhoidal bleeding suspected based on history of exam yesterday. We added stool softener and anusol yesterday. Her hgb is stable at 10.4. No plans for colonoscopic workup for now. She can't take stool softeners (? Reason). Will d/c. She had rather take Ex-lax as needed  2. Sigmoid diverticulitis by CTscan. On cipro / flagyl. Clinically doing okay. No abdominal pain or fever. Normal WBC. Would complete course of antibiotics.      LOS: 2 days   Tye Savoy  09/29/2014, 9:29 AM  Attending MD note:   I have taken a history, examined the  patient, and reviewed the chart. I agree with the Advanced Practitioner's impression and recommendations.   [facility] Pager # [phone].

## 2014-10-08 ENCOUNTER — Ambulatory Visit: Payer: Medicare Other | Admitting: Nurse Practitioner

## 2014-10-08 ENCOUNTER — Telehealth: Payer: Self-pay | Admitting: Nurse Practitioner

## 2014-10-08 NOTE — Telephone Encounter (Signed)
Appointment rescheduled for Monday.

## 2014-10-11 ENCOUNTER — Ambulatory Visit (INDEPENDENT_AMBULATORY_CARE_PROVIDER_SITE_OTHER): Payer: Medicare Other | Admitting: Nurse Practitioner

## 2014-10-11 ENCOUNTER — Encounter: Payer: Self-pay | Admitting: Nurse Practitioner

## 2014-10-11 VITALS — BP 136/88 | HR 89 | Temp 98.0°F | Ht 60.0 in | Wt 188.0 lb

## 2014-10-11 DIAGNOSIS — K921 Melena: Secondary | ICD-10-CM

## 2014-10-11 DIAGNOSIS — K5731 Diverticulosis of large intestine without perforation or abscess with bleeding: Secondary | ICD-10-CM | POA: Diagnosis not present

## 2014-10-11 DIAGNOSIS — Z09 Encounter for follow-up examination after completed treatment for conditions other than malignant neoplasm: Secondary | ICD-10-CM | POA: Diagnosis not present

## 2014-10-11 DIAGNOSIS — I2510 Atherosclerotic heart disease of native coronary artery without angina pectoris: Secondary | ICD-10-CM | POA: Diagnosis not present

## 2014-10-11 LAB — POCT HEMOGLOBIN: Hemoglobin: 12.8 g/dL (ref 12.2–16.2)

## 2014-10-11 MED ORDER — ALPRAZOLAM 1 MG PO TABS: 1.0000 mg | ORAL_TABLET | Freq: Four times a day (QID) | ORAL | Status: DC

## 2014-10-11 NOTE — Progress Notes (Signed)
Subjective:    Patient ID: Sharon Compton, female    DOB: March 04, 1941, 74 y.o.   MRN: [id number]  HPI Patient is here for a hospital follow up. She went to [facility] for rectal bleed. She was found to have diverticulitis on a CT scan.  She was given antibiotic cipro and flagyl. She has completed the antibiotics. She denies any rectal bleeding since discharge. She reports doing well. No acute complaint today.   Patient Active Problem List   Diagnosis Date Noted  . Esophageal reflux   . Essential hypertension   . Depression with anxiety   . GI bleeding 09/27/2014  . Diverticulitis 09/27/2014  . Rectal bleeding 09/27/2014  . Iron deficiency anemia 03/19/2014  . Atrial fibrillation with RVR 11/03/2013  . Insomnia 07/16/2013  . Major depressive disorder, recurrent episode, severe 06/14/2013  . Posttraumatic stress disorder 06/14/2013  . Acute on chronic diastolic CHF (congestive heart failure), NYHA class 3 03/05/2013  . CAD (coronary artery disease), native coronary artery 03/05/2013  . Angina, class III 03/05/2013  . Hypokalemia 03/05/2013  . HTN (hypertension), benign 09/27/2012  . Hyperglycemia 10/03/2011  . Asthma 10/01/2011  . Atrial fibrillation 10/01/2011  . 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 Diverticulosis 10/01/2011  . Peripheral edema 08/23/2011  . Elevated LFTs 08/22/2011  . Anxiety disorder 08/21/2011  . GI bleed 08/20/2011  . Obesity 08/20/2011    Current Outpatient Prescriptions on File Prior to Visit  Medication Sig Dispense Refill  . ALPRAZolam (XANAX) 1 MG tablet TAKE ONE TABLET BY MOUTH 4 TIMES DAILY 120 tablet 1  . aspirin EC 81 MG tablet Take 1 tablet (81 mg total) by mouth daily. HOLD ASPIRIN FOR 1 WEEK GIVEN BLEEDING; THEN OK TO RESUME    . FERREX 150 150 MG capsule TAKE ONE CAPSULE BY MOUTH ONCE DAILY 30 capsule 5  . gabapentin (NEURONTIN) 400 MG capsule TAKE ONE CAPSULE BY MOUTH 4 TIMES DAILY 120 capsule 2  . hydrocortisone  (ANUSOL-HC) 25 MG suppository Place 1 suppository (25 mg total) rectally 2 (two) times daily. USE FOR 3 DAYS 6 suppository 0  . lisinopril (PRINIVIL,ZESTRIL) 40 MG tablet Take 1 tablet (40 mg total) by mouth daily. 90 tablet 1  . metoprolol tartrate (LOPRESSOR) 25 MG tablet Take 1 tablet (25 mg total) by mouth 2 (two) times daily. (Patient taking differently: Take 25 mg by mouth daily. ) 180 tablet 1  . nitroGLYCERIN (NITROSTAT) 0.4 MG SL tablet Place 1 tablet (0.4 mg total) under the tongue every 5 (five) minutes as needed for chest pain. 30 tablet 0  . pantoprazole (PROTONIX) 40 MG tablet Take 1 tablet (40 mg total) by mouth daily. 90 tablet 1  . polyethylene glycol (MIRALAX / GLYCOLAX) packet Take 17 g by mouth daily. 30 each 0  . pravastatin (PRAVACHOL) 80 MG tablet Take 1 tablet (80 mg total) by mouth daily. 90 tablet 1  . SYNTHROID 50 MCG tablet 1 po every other day (Patient taking differently: Take 50 mcg by mouth every other day. ) 90 tablet 1  . torsemide (DEMADEX) 20 MG tablet Take 20 mg by mouth daily.     No current facility-administered medications on file prior to visit.     Review of Systems  Constitutional: Negative.   HENT: Negative.   Eyes: Negative.   Respiratory: Negative.   Cardiovascular: Negative.   Gastrointestinal: Negative.   Endocrine: Negative.   Genitourinary: Negative.   Musculoskeletal: Negative.   Skin: Negative.  Allergic/Immunologic: Negative.   Neurological: Negative.   Hematological: Negative.   Psychiatric/Behavioral: Negative.        Objective:   Physical Exam  Constitutional: She is oriented to person, place, and time. She appears well-developed and well-nourished.  HENT:  Head: Normocephalic.  Eyes: Pupils are equal, round, and reactive to light.  Neck: Normal range of motion.  Cardiovascular: Normal rate and regular rhythm.   Pulmonary/Chest: Effort normal and breath sounds normal.  Abdominal: Soft. Bowel sounds are normal.    Musculoskeletal: Normal range of motion. She exhibits edema. Tenderness: bil lower extremities +2.   Neurological: She is alert and oriented to person, place, and time.  Skin: Skin is warm.  Psychiatric: She has a normal mood and affect. Her behavior is normal. Judgment and thought content normal.    BP 136/88 mmHg  Pulse 89  Temp(Src) 98 F (36.7 C) (Oral)  Ht 5' (1.524 m)  Wt 188 lb (85.276 kg)  BMI 36.72 kg/m2      Assessment & Plan:   1. Hospital discharge follow-up   2. Blood in stool   3. Diverticulosis of large intestine with hemorrhage    Meds ordered this encounter  Medications  . ALPRAZolam (XANAX) 1 MG tablet    Sig: Take 1 tablet (1 mg total) by mouth 4 (four) times daily.    Dispense:  120 tablet    Refill:  1    Order Specific Question:  Supervising Provider    Answer:  Joycelyn Man   Watch diet- do not eat things with small seeds RTO for regular follow up  Drew, FNP

## 2014-10-11 NOTE — Patient Instructions (Signed)
Diverticulosis Diverticulosis is the condition that develops when small pouches (diverticula) form in the wall of your colon. Your colon, or large intestine, is where water is absorbed and stool is formed. The pouches form when the inside layer of your colon pushes through weak spots in the outer layers of your colon. CAUSES  No one knows exactly what causes diverticulosis. RISK FACTORS  Being older than 50. Your risk for this condition increases with age. Diverticulosis is rare in people younger than 40 years. By age 80, almost everyone has it.  Eating a low-fiber diet.  Being frequently constipated.  Being overweight.  Not getting enough exercise.  Smoking.  Taking over-the-counter pain medicines, like aspirin and ibuprofen. SYMPTOMS  Most people with diverticulosis do not have symptoms. DIAGNOSIS  Because diverticulosis often has no symptoms, health care providers often discover the condition during an exam for other colon problems. In many cases, a health care provider will diagnose diverticulosis while using a flexible scope to examine the colon (colonoscopy). TREATMENT  If you have never developed an infection related to diverticulosis, you may not need treatment. If you have had an infection before, treatment may include:  Eating more fruits, vegetables, and grains.  Taking a fiber supplement.  Taking a live bacteria supplement (probiotic).  Taking medicine to relax your colon. HOME CARE INSTRUCTIONS   Drink at least 6-8 glasses of water each day to prevent constipation.  Try not to strain when you have a bowel movement.  Keep all follow-up appointments. If you have had an infection before:  Increase the fiber in your diet as directed by your health care provider or dietitian.  Take a dietary fiber supplement if your health care provider approves.  Only take medicines as directed by your health care provider. SEEK MEDICAL CARE IF:   You have abdominal  pain.  You have bloating.  You have cramps.  You have not gone to the bathroom in 3 days. SEEK IMMEDIATE MEDICAL CARE IF:   Your pain gets worse.  Yourbloating becomes very bad.  You have a fever or chills, and your symptoms suddenly get worse.  You begin vomiting.  You have bowel movements that are bloody or black. MAKE SURE YOU:  Understand these instructions.  Will watch your condition.  Will get help right away if you are not doing well or get worse. Document Released: 03/08/2004 Document Revised: 06/16/2013 Document Reviewed: 05/06/2013 Exit[employer] Patient Information 2015 [employer]. This information is not intended to replace advice given to you by your health care provider. Make sure you discuss any questions you have with your health care provider.  

## 2014-10-28 ENCOUNTER — Other Ambulatory Visit: Payer: Self-pay | Admitting: *Deleted

## 2014-10-28 DIAGNOSIS — K219 Gastro-esophageal reflux disease without esophagitis: Secondary | ICD-10-CM

## 2014-11-04 ENCOUNTER — Other Ambulatory Visit: Payer: Self-pay | Admitting: Cardiology

## 2014-11-04 DIAGNOSIS — K219 Gastro-esophageal reflux disease without esophagitis: Secondary | ICD-10-CM

## 2014-11-04 MED ORDER — PANTOPRAZOLE SODIUM 40 MG PO TBEC: 40.0000 mg | DELAYED_RELEASE_TABLET | Freq: Every day | ORAL | Status: DC

## 2014-11-04 NOTE — Telephone Encounter (Signed)
Patient is calling requesting refill on  Protonix  Please call [place], [place]

## 2014-11-10 ENCOUNTER — Ambulatory Visit: Payer: Medicare Other | Admitting: Nurse Practitioner

## 2014-11-16 ENCOUNTER — Telehealth: Payer: Self-pay | Admitting: Nurse Practitioner

## 2014-11-16 DIAGNOSIS — I10 Essential (primary) hypertension: Secondary | ICD-10-CM

## 2014-11-16 MED ORDER — LISINOPRIL 40 MG PO TABS: 40.0000 mg | ORAL_TABLET | Freq: Every day | ORAL | Status: DC

## 2014-11-16 MED ORDER — METOPROLOL TARTRATE 25 MG PO TABS: 25.0000 mg | ORAL_TABLET | Freq: Every day | ORAL | Status: DC

## 2014-11-16 NOTE — Telephone Encounter (Signed)
done

## 2014-11-17 ENCOUNTER — Telehealth: Payer: Self-pay | Admitting: Nurse Practitioner

## 2014-11-17 ENCOUNTER — Other Ambulatory Visit: Payer: Self-pay | Admitting: *Deleted

## 2014-11-17 DIAGNOSIS — I10 Essential (primary) hypertension: Secondary | ICD-10-CM

## 2014-11-17 MED ORDER — METOPROLOL TARTRATE 25 MG PO TABS: 25.0000 mg | ORAL_TABLET | Freq: Two times a day (BID) | ORAL | Status: DC

## 2014-11-17 NOTE — Telephone Encounter (Signed)
fixed

## 2014-12-08 ENCOUNTER — Ambulatory Visit (INDEPENDENT_AMBULATORY_CARE_PROVIDER_SITE_OTHER): Payer: Medicare Other | Admitting: Family Medicine

## 2014-12-08 ENCOUNTER — Encounter: Payer: Self-pay | Admitting: Family Medicine

## 2014-12-08 VITALS — BP 180/83 | HR 73 | Temp 97.7°F | Ht 60.0 in | Wt 190.0 lb

## 2014-12-08 DIAGNOSIS — K219 Gastro-esophageal reflux disease without esophagitis: Secondary | ICD-10-CM

## 2014-12-08 DIAGNOSIS — R5383 Other fatigue: Secondary | ICD-10-CM | POA: Diagnosis not present

## 2014-12-08 DIAGNOSIS — I2510 Atherosclerotic heart disease of native coronary artery without angina pectoris: Secondary | ICD-10-CM

## 2014-12-08 DIAGNOSIS — I10 Essential (primary) hypertension: Secondary | ICD-10-CM

## 2014-12-08 LAB — POCT CBC
Granulocyte percent: 52.1 %G (ref 37–80)
HCT, POC: 34.6 % — AB (ref 37.7–47.9)
Hemoglobin: 10.8 g/dL — AB (ref 12.2–16.2)
LYMPH, POC: 2.4 (ref 0.6–3.4)
MCH: 24.5 pg — AB (ref 27–31.2)
MCHC: 31.2 g/dL — AB (ref 31.8–35.4)
MCV: 78.4 fL — AB (ref 80–97)
MPV: 7 fL (ref 0–99.8)
PLATELET COUNT, POC: 229 10*3/uL (ref 142–424)
POC GRANULOCYTE: 3 (ref 2–6.9)
POC LYMPH %: 41.7 % (ref 10–50)
RBC: 4.41 M/uL (ref 4.04–5.48)
RDW, POC: 16.1 %
WBC: 5.8 10*3/uL (ref 4.6–10.2)

## 2014-12-08 MED ORDER — ESCITALOPRAM OXALATE 10 MG PO TABS: 10.0000 mg | ORAL_TABLET | Freq: Every day | ORAL | Status: DC

## 2014-12-08 MED ORDER — LISINOPRIL 40 MG PO TABS: 40.0000 mg | ORAL_TABLET | Freq: Every day | ORAL | Status: DC

## 2014-12-08 NOTE — Progress Notes (Signed)
Subjective:    Patient ID: Sharon Compton, female    DOB: 23-Jun-1941, 74 y.o.   MRN: [id number]  HPI 74 year old female with multiple medical problems including poorly controlled hypertension, hyperlipidemia, dysthymic disorder including anxiety and depression and more recently diverticular disease with GI bleed.  Patient Active Problem List   Diagnosis Date Noted  . Esophageal reflux   . Essential hypertension   . Depression with anxiety   . GI bleeding 09/27/2014  . Diverticulitis 09/27/2014  . Rectal bleeding 09/27/2014  . Iron deficiency anemia 03/19/2014  . Atrial fibrillation with RVR 11/03/2013  . Insomnia 07/16/2013  . Major depressive disorder, recurrent episode, severe 06/14/2013  . Posttraumatic stress disorder 06/14/2013  . Acute on chronic diastolic CHF (congestive heart failure), NYHA class 3 03/05/2013  . CAD (coronary artery disease), native coronary artery 03/05/2013  . Angina, class III 03/05/2013  . Hypokalemia 03/05/2013  . HTN (hypertension), benign 09/27/2012  . Hyperglycemia 10/03/2011  . Asthma 10/01/2011  . Atrial fibrillation 10/01/2011  . 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 Diverticulosis 10/01/2011  . Peripheral edema 08/23/2011  . Elevated LFTs 08/22/2011  . Anxiety disorder 08/21/2011  . GI bleed 08/20/2011  . Obesity 08/20/2011   Outpatient Encounter Prescriptions as of 12/08/2014  Medication Sig  . ALPRAZolam (XANAX) 1 MG tablet Take 1 tablet (1 mg total) by mouth 4 (four) times daily.  [name] aspirin EC 81 MG tablet Take 1 tablet (81 mg total) by mouth daily. HOLD ASPIRIN FOR 1 WEEK GIVEN BLEEDING; THEN OK TO RESUME  . FERREX 150 150 MG capsule TAKE ONE CAPSULE BY MOUTH ONCE DAILY  . gabapentin (NEURONTIN) 400 MG capsule TAKE ONE CAPSULE BY MOUTH 4 TIMES DAILY  . hydrocortisone (ANUSOL-HC) 25 MG suppository Place 1 suppository (25 mg total) rectally 2 (two) times daily. USE FOR 3 DAYS  . lisinopril (PRINIVIL,ZESTRIL) 40 MG  tablet Take 1 tablet (40 mg total) by mouth daily.  . metoprolol tartrate (LOPRESSOR) 25 MG tablet Take 1 tablet (25 mg total) by mouth 2 (two) times daily.  . nitroGLYCERIN (NITROSTAT) 0.4 MG SL tablet Place 1 tablet (0.4 mg total) under the tongue every 5 (five) minutes as needed for chest pain.  [name] SYNTHROID 50 MCG tablet 1 po every other day (Patient taking differently: Take 50 mcg by mouth every other day. )  . torsemide (DEMADEX) 20 MG tablet Take 20 mg by mouth daily.  . [DISCONTINUED] lisinopril (PRINIVIL,ZESTRIL) 40 MG tablet Take 1 tablet (40 mg total) by mouth daily.  . pantoprazole (PROTONIX) 40 MG tablet Take 1 tablet (40 mg total) by mouth daily.  . polyethylene glycol (MIRALAX / GLYCOLAX) packet Take 17 g by mouth daily.  . pravastatin (PRAVACHOL) 80 MG tablet Take 1 tablet (80 mg total) by mouth daily.   No facility-administered encounter medications on file as of 12/08/2014.       Review of Systems  Constitutional: Negative.   Respiratory: Negative.   Cardiovascular: Positive for leg swelling.  Gastrointestinal: Positive for blood in stool.  Genitourinary: Negative.   Musculoskeletal: Positive for myalgias.  Hematological: Negative.   Psychiatric/Behavioral: The patient is nervous/anxious.        Objective:   Physical Exam  Constitutional: She is oriented to person, place, and time. She appears well-developed and well-nourished.  Cardiovascular: Normal rate and regular rhythm.   Pulmonary/Chest: Effort normal and breath sounds normal.  Abdominal: Soft.  Neurological: She is alert and oriented to person, place, and time.  BP 180/83 mmHg  Pulse 73  Temp(Src) 97.7 F (36.5 C) (Oral)  Ht 5' (1.524 m)  Wt 190 lb (86.183 kg)  BMI 37.11 kg/m2       Assessment & Plan:  1. HTN (hypertension), benign Continue lisinopril to take metoprolol 25 mg twice a day for better blood pressure control - lisinopril (PRINIVIL,ZESTRIL) 40 MG tablet; Take 1 tablet (40 mg  total) by mouth daily.  Dispense: 90 tablet; Refill: 1  2. Gastroesophageal reflux disease, esophagitis presence not specified   3. Other fatigue Likely cause of fatigue his anxiety and depression but I will rule out correctable causes. Also begin Lexapro 10 mg daily and recheck in one month - POCT CBC - BMP8+EGFR - TSH  Wardell Honour MD

## 2014-12-09 LAB — TSH: TSH: 5.54 u[IU]/mL — ABNORMAL HIGH (ref 0.450–4.500)

## 2014-12-09 LAB — BMP8+EGFR
BUN/Creatinine Ratio: 14 (ref 11–26)
BUN: 13 mg/dL (ref 8–27)
CALCIUM: 8.8 mg/dL (ref 8.7–10.3)
CO2: 25 mmol/L (ref 18–29)
CREATININE: 0.96 mg/dL (ref 0.57–1.00)
Chloride: 100 mmol/L (ref 97–108)
GFR calc non Af Amer: 59 mL/min/{1.73_m2} — ABNORMAL LOW (ref >59)
GFR, EST AFRICAN AMERICAN: 68 mL/min/{1.73_m2} (ref >59)
GLUCOSE: 88 mg/dL (ref 65–99)
Potassium: 4.4 mmol/L (ref 3.5–5.2)
Sodium: 142 mmol/L (ref 134–144)

## 2015-01-07 ENCOUNTER — Encounter: Payer: Self-pay | Admitting: Family Medicine

## 2015-01-07 ENCOUNTER — Ambulatory Visit (INDEPENDENT_AMBULATORY_CARE_PROVIDER_SITE_OTHER): Payer: Medicare Other | Admitting: Family Medicine

## 2015-01-07 VITALS — BP 203/85 | HR 79 | Temp 97.4°F | Ht 60.0 in | Wt 187.0 lb

## 2015-01-07 DIAGNOSIS — I2510 Atherosclerotic heart disease of native coronary artery without angina pectoris: Secondary | ICD-10-CM

## 2015-01-07 DIAGNOSIS — I10 Essential (primary) hypertension: Secondary | ICD-10-CM

## 2015-01-07 DIAGNOSIS — F411 Generalized anxiety disorder: Secondary | ICD-10-CM

## 2015-01-07 DIAGNOSIS — I4891 Unspecified atrial fibrillation: Secondary | ICD-10-CM | POA: Diagnosis not present

## 2015-01-07 MED ORDER — GABAPENTIN 400 MG PO CAPS
400.0000 mg | ORAL_CAPSULE | Freq: Two times a day (BID) | ORAL | Status: DC
Start: 2015-01-07 — End: 2015-01-25

## 2015-01-07 MED ORDER — METOPROLOL TARTRATE 25 MG PO TABS: ORAL_TABLET | ORAL | Status: DC

## 2015-01-07 MED ORDER — ALPRAZOLAM 1 MG PO TABS: 1.0000 mg | ORAL_TABLET | Freq: Four times a day (QID) | ORAL | Status: DC

## 2015-01-07 NOTE — Progress Notes (Signed)
Subjective:    Patient ID: Sharon Compton, female    DOB: 04-Feb-1941, 74 y.o.   MRN: [id number]  HPI 74 year old female with hypertension coronary disease, anxiety who says that she just feels bad. She feels tired. She thinks some of her medicine may be contributing to her symptoms and with careful review some changes are made  Patient Active Problem List   Diagnosis Date Noted  . Esophageal reflux   . Essential hypertension   . Depression with anxiety   . GI bleeding 09/27/2014  . Diverticulitis 09/27/2014  . Rectal bleeding 09/27/2014  . Iron deficiency anemia 03/19/2014  . Atrial fibrillation with RVR 11/03/2013  . Insomnia 07/16/2013  . Major depressive disorder, recurrent episode, severe 06/14/2013  . Posttraumatic stress disorder 06/14/2013  . Acute on chronic diastolic CHF (congestive heart failure), NYHA class 3 03/05/2013  . CAD (coronary artery disease), native coronary artery 03/05/2013  . Angina, class III 03/05/2013  . Hypokalemia 03/05/2013  . HTN (hypertension), benign 09/27/2012  . Hyperglycemia 10/03/2011  . Asthma 10/01/2011  . Atrial fibrillation 10/01/2011  . 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 Diverticulosis 10/01/2011  . Peripheral edema 08/23/2011  . Elevated LFTs 08/22/2011  . Anxiety disorder 08/21/2011  . GI bleed 08/20/2011  . Obesity 08/20/2011   Outpatient Encounter Prescriptions as of 01/07/2015  Medication Sig  . ALPRAZolam (XANAX) 1 MG tablet Take 1 tablet (1 mg total) by mouth 4 (four) times daily.  [name] aspirin EC 81 MG tablet Take 1 tablet (81 mg total) by mouth daily. HOLD ASPIRIN FOR 1 WEEK GIVEN BLEEDING; THEN OK TO RESUME  . Black Cohosh-SoyIsoflav-Magnol (ESTROVEN MENOPAUSE RELIEF PO) Take by mouth.  . FERREX 150 150 MG capsule TAKE ONE CAPSULE BY MOUTH ONCE DAILY  . gabapentin (NEURONTIN) 400 MG capsule TAKE ONE CAPSULE BY MOUTH 4 TIMES DAILY  . hydrocortisone (ANUSOL-HC) 25 MG suppository Place 1 suppository (25 mg  total) rectally 2 (two) times daily. USE FOR 3 DAYS  . lisinopril (PRINIVIL,ZESTRIL) 40 MG tablet Take 1 tablet (40 mg total) by mouth daily.  . metoprolol tartrate (LOPRESSOR) 25 MG tablet Take 1 tablet (25 mg total) by mouth 2 (two) times daily.  . nitroGLYCERIN (NITROSTAT) 0.4 MG SL tablet Place 1 tablet (0.4 mg total) under the tongue every 5 (five) minutes as needed for chest pain.  . pantoprazole (PROTONIX) 40 MG tablet Take 1 tablet (40 mg total) by mouth daily.  . polyethylene glycol (MIRALAX / GLYCOLAX) packet Take 17 g by mouth daily.  . pravastatin (PRAVACHOL) 80 MG tablet Take 1 tablet (80 mg total) by mouth daily.  [name] SYNTHROID 50 MCG tablet 1 po every other day (Patient taking differently: Take 50 mcg by mouth every other day. )  . torsemide (DEMADEX) 20 MG tablet Take 20 mg by mouth daily.  [name] escitalopram (LEXAPRO) 10 MG tablet Take 1 tablet (10 mg total) by mouth daily. (Patient not taking: Reported on 01/07/2015)   No facility-administered encounter medications on file as of 01/07/2015.      Review of Systems  Constitutional: Positive for activity change and fatigue.  HENT: Negative.   Respiratory: Negative.   Cardiovascular: Negative.   Genitourinary: Negative.   Neurological: Negative.   Psychiatric/Behavioral: Negative.        Objective:   Physical Exam  Constitutional: She is oriented to person, place, and time. She appears well-developed and well-nourished.  Cardiovascular: Normal rate and regular rhythm.   Pulmonary/Chest: Effort normal and breath sounds  normal.  Abdominal: Soft.  Musculoskeletal: Normal range of motion.  Neurological: She is alert and oriented to person, place, and time.  Psychiatric: She has a normal mood and affect.   BP 203/85 mmHg  Pulse 79  Temp(Src) 97.4 F (36.3 C) (Oral)  Ht 5' (1.524 m)  Wt 187 lb (84.823 kg)  BMI 36.52 kg/m2        Assessment & Plan:  1. HTN (hypertension), benign Since her blood pressure is elevated  today with systolic at 254, will increase metoprolol to 50 mg at bedtime and continue with 20 5 in the morning. - metoprolol tartrate (LOPRESSOR) 25 MG tablet; Take one tablet in the morning and two tablets at night  Dispense: 90 tablet; Refill: 2   2. Generalized anxiety disorder Patient could not take Lexapro so we'll refill Xanax she will take that as needed for anxiety  3. Atrial fibrillation, unspecified Rate is controlled.  Continue with same regimen  Wardell Honour MD

## 2015-01-12 ENCOUNTER — Telehealth: Payer: Self-pay | Admitting: Cardiology

## 2015-01-12 NOTE — Telephone Encounter (Signed)
Patient is having her PCP handling her heart issues. Wanted Dr Harl Bowie to know he is a wonderful doctor, but cant come due to transportation and money and husband's illness

## 2015-01-21 ENCOUNTER — Observation Stay (HOSPITAL_COMMUNITY): Payer: Medicare Other

## 2015-01-21 ENCOUNTER — Emergency Department (HOSPITAL_COMMUNITY): Payer: Medicare Other

## 2015-01-21 ENCOUNTER — Inpatient Hospital Stay (HOSPITAL_COMMUNITY)
Admission: EM | Admit: 2015-01-21 | Discharge: 2015-01-25 | DRG: 281 | Disposition: A | Discharge status: Home / Self Care | Payer: Medicare Other | Attending: Cardiology | Admitting: Cardiology

## 2015-01-21 ENCOUNTER — Encounter (HOSPITAL_COMMUNITY): Payer: Self-pay | Admitting: *Deleted

## 2015-01-21 DIAGNOSIS — I252 Old myocardial infarction: Secondary | ICD-10-CM

## 2015-01-21 DIAGNOSIS — J449 Chronic obstructive pulmonary disease, unspecified: Secondary | ICD-10-CM | POA: Diagnosis present

## 2015-01-21 DIAGNOSIS — F332 Major depressive disorder, recurrent severe without psychotic features: Secondary | ICD-10-CM | POA: Diagnosis not present

## 2015-01-21 DIAGNOSIS — Z7982 Long term (current) use of aspirin: Secondary | ICD-10-CM

## 2015-01-21 DIAGNOSIS — Z7722 Contact with and (suspected) exposure to environmental tobacco smoke (acute) (chronic): Secondary | ICD-10-CM | POA: Diagnosis present

## 2015-01-21 DIAGNOSIS — I214 Non-ST elevation (NSTEMI) myocardial infarction: Secondary | ICD-10-CM | POA: Diagnosis not present

## 2015-01-21 DIAGNOSIS — Z79899 Other long term (current) drug therapy: Secondary | ICD-10-CM

## 2015-01-21 DIAGNOSIS — I25810 Atherosclerosis of coronary artery bypass graft(s) without angina pectoris: Secondary | ICD-10-CM | POA: Diagnosis not present

## 2015-01-21 DIAGNOSIS — I5032 Chronic diastolic (congestive) heart failure: Secondary | ICD-10-CM

## 2015-01-21 DIAGNOSIS — I10 Essential (primary) hypertension: Secondary | ICD-10-CM | POA: Diagnosis present

## 2015-01-21 DIAGNOSIS — M797 Fibromyalgia: Secondary | ICD-10-CM | POA: Diagnosis present

## 2015-01-21 DIAGNOSIS — R079 Chest pain, unspecified: Secondary | ICD-10-CM | POA: Diagnosis present

## 2015-01-21 DIAGNOSIS — R0789 Other chest pain: Secondary | ICD-10-CM

## 2015-01-21 DIAGNOSIS — I480 Paroxysmal atrial fibrillation: Secondary | ICD-10-CM | POA: Diagnosis not present

## 2015-01-21 DIAGNOSIS — F419 Anxiety disorder, unspecified: Secondary | ICD-10-CM | POA: Diagnosis present

## 2015-01-21 DIAGNOSIS — I4891 Unspecified atrial fibrillation: Secondary | ICD-10-CM | POA: Diagnosis present

## 2015-01-21 DIAGNOSIS — Z955 Presence of coronary angioplasty implant and graft: Secondary | ICD-10-CM

## 2015-01-21 DIAGNOSIS — I2510 Atherosclerotic heart disease of native coronary artery without angina pectoris: Secondary | ICD-10-CM | POA: Insufficient documentation

## 2015-01-21 DIAGNOSIS — K219 Gastro-esophageal reflux disease without esophagitis: Secondary | ICD-10-CM | POA: Diagnosis present

## 2015-01-21 DIAGNOSIS — Z87891 Personal history of nicotine dependence: Secondary | ICD-10-CM

## 2015-01-21 LAB — BASIC METABOLIC PANEL
Anion gap: 9 (ref 5–15)
BUN: 16 mg/dL (ref 6–20)
CALCIUM: 8.6 mg/dL — AB (ref 8.9–10.3)
CO2: 30 mmol/L (ref 22–32)
CREATININE: 0.96 mg/dL (ref 0.44–1.00)
Chloride: 101 mmol/L (ref 101–111)
GFR calc non Af Amer: 57 mL/min — ABNORMAL LOW (ref >60)
Glucose, Bld: 134 mg/dL — ABNORMAL HIGH (ref 65–99)
Potassium: 3.8 mmol/L (ref 3.5–5.1)
Sodium: 140 mmol/L (ref 135–145)

## 2015-01-21 LAB — CBC WITH DIFFERENTIAL/PLATELET
Basophils Absolute: 0 10*3/uL (ref 0.0–0.1)
Basophils Relative: 1 % (ref 0–1)
Eosinophils Absolute: 0 10*3/uL (ref 0.0–0.7)
Eosinophils Relative: 0 % (ref 0–5)
HCT: 36.2 % (ref 36.0–46.0)
Hemoglobin: 10.9 g/dL — ABNORMAL LOW (ref 12.0–15.0)
LYMPHS ABS: 2 10*3/uL (ref 0.7–4.0)
LYMPHS PCT: 27 % (ref 12–46)
MCH: 24.3 pg — AB (ref 26.0–34.0)
MCHC: 30.1 g/dL (ref 30.0–36.0)
MCV: 80.8 fL (ref 78.0–100.0)
MONO ABS: 0.4 10*3/uL (ref 0.1–1.0)
Monocytes Relative: 5 % (ref 3–12)
Neutro Abs: 4.8 10*3/uL (ref 1.7–7.7)
Neutrophils Relative %: 67 % (ref 43–77)
PLATELETS: 196 10*3/uL (ref 150–400)
RBC: 4.48 MIL/uL (ref 3.87–5.11)
RDW: 16.2 % — ABNORMAL HIGH (ref 11.5–15.5)
WBC: 7.3 10*3/uL (ref 4.0–10.5)

## 2015-01-21 LAB — BRAIN NATRIURETIC PEPTIDE: B Natriuretic Peptide: 243 pg/mL — ABNORMAL HIGH (ref 0.0–100.0)

## 2015-01-21 LAB — MAGNESIUM: Magnesium: 1.9 mg/dL (ref 1.7–2.4)

## 2015-01-21 LAB — TROPONIN I
TROPONIN I: 0.41 ng/mL — AB (ref <0.031)
Troponin I: 0.04 ng/mL — ABNORMAL HIGH (ref <0.031)
Troponin I: 0.23 ng/mL — ABNORMAL HIGH (ref <0.031)
Troponin I: 0.43 ng/mL — ABNORMAL HIGH (ref <0.031)

## 2015-01-21 MED ORDER — HEPARIN SODIUM (PORCINE) 5000 UNIT/ML IJ SOLN
5000.0000 [IU] | Freq: Three times a day (TID) | INTRAMUSCULAR | Status: DC
  Administered 2015-01-21 (×2): 5000 [IU] via SUBCUTANEOUS
  Filled 2015-01-21 (×2): qty 1

## 2015-01-21 MED ORDER — HEPARIN BOLUS VIA INFUSION
2000.0000 [IU] | Freq: Once | INTRAVENOUS | Status: AC
  Administered 2015-01-21: 2000 [IU] via INTRAVENOUS
  Filled 2015-01-21: qty 2000

## 2015-01-21 MED ORDER — LEVOTHYROXINE SODIUM 50 MCG PO TABS
50.0000 ug | ORAL_TABLET | ORAL | Status: DC
  Administered 2015-01-23 – 2015-01-25 (×2): 50 ug via ORAL
  Filled 2015-01-21 (×3): qty 1

## 2015-01-21 MED ORDER — PANTOPRAZOLE SODIUM 40 MG PO TBEC
40.0000 mg | DELAYED_RELEASE_TABLET | Freq: Every day | ORAL | Status: DC
Start: 2015-01-21 — End: 2015-01-25
  Administered 2015-01-21 – 2015-01-25 (×4): 40 mg via ORAL
  Filled 2015-01-21 (×5): qty 1

## 2015-01-21 MED ORDER — ALPRAZOLAM 0.5 MG PO TABS
1.0000 mg | ORAL_TABLET | Freq: Four times a day (QID) | ORAL | Status: DC
  Administered 2015-01-21 – 2015-01-25 (×17): 1 mg via ORAL
  Filled 2015-01-21: qty 2
  Filled 2015-01-21: qty 1
  Filled 2015-01-21 (×2): qty 2
  Filled 2015-01-21: qty 1
  Filled 2015-01-21: qty 2
  Filled 2015-01-21: qty 1
  Filled 2015-01-21 (×6): qty 2
  Filled 2015-01-21 (×2): qty 1
  Filled 2015-01-21 (×2): qty 2

## 2015-01-21 MED ORDER — POLYSACCHARIDE IRON COMPLEX 150 MG PO CAPS
150.0000 mg | ORAL_CAPSULE | Freq: Every day | ORAL | Status: DC
  Administered 2015-01-21 – 2015-01-25 (×5): 150 mg via ORAL
  Filled 2015-01-21 (×5): qty 1

## 2015-01-21 MED ORDER — ASPIRIN EC 81 MG PO TBEC
81.0000 mg | DELAYED_RELEASE_TABLET | Freq: Every day | ORAL | Status: DC
  Administered 2015-01-21 – 2015-01-25 (×5): 81 mg via ORAL
  Filled 2015-01-21 (×5): qty 1

## 2015-01-21 MED ORDER — PRAVASTATIN SODIUM 40 MG PO TABS
40.0000 mg | ORAL_TABLET | Freq: Every day | ORAL | Status: DC
  Administered 2015-01-21 – 2015-01-23 (×3): 40 mg via ORAL
  Filled 2015-01-21 (×3): qty 1

## 2015-01-21 MED ORDER — LISINOPRIL 40 MG PO TABS
40.0000 mg | ORAL_TABLET | Freq: Every day | ORAL | Status: DC
  Administered 2015-01-21 – 2015-01-25 (×4): 40 mg via ORAL
  Filled 2015-01-21: qty 1
  Filled 2015-01-21: qty 4
  Filled 2015-01-21 (×2): qty 1
  Filled 2015-01-21: qty 4

## 2015-01-21 MED ORDER — METOPROLOL TARTRATE 50 MG PO TABS
50.0000 mg | ORAL_TABLET | Freq: Every day | ORAL | Status: DC
  Filled 2015-01-21: qty 1

## 2015-01-21 MED ORDER — ACETAMINOPHEN 325 MG PO TABS: 650.0000 mg | ORAL_TABLET | ORAL | Status: DC | PRN

## 2015-01-21 MED ORDER — POLYETHYLENE GLYCOL 3350 17 G PO PACK
17.0000 g | PACK | Freq: Every day | ORAL | Status: DC
  Administered 2015-01-21: 17 g via ORAL
  Filled 2015-01-21 (×3): qty 1

## 2015-01-21 MED ORDER — ESCITALOPRAM OXALATE 10 MG PO TABS
10.0000 mg | ORAL_TABLET | Freq: Every day | ORAL | Status: DC
  Administered 2015-01-21 – 2015-01-23 (×2): 10 mg via ORAL
  Filled 2015-01-21 (×4): qty 1

## 2015-01-21 MED ORDER — TORSEMIDE 20 MG PO TABS
20.0000 mg | ORAL_TABLET | Freq: Every day | ORAL | Status: DC
  Administered 2015-01-21: 20 mg via ORAL
  Filled 2015-01-21 (×3): qty 1

## 2015-01-21 MED ORDER — METOPROLOL TARTRATE 25 MG PO TABS: 25.0000 mg | ORAL_TABLET | Freq: Two times a day (BID) | ORAL | Status: DC

## 2015-01-21 MED ORDER — METOPROLOL TARTRATE 25 MG PO TABS
25.0000 mg | ORAL_TABLET | Freq: Every day | ORAL | Status: DC
  Administered 2015-01-21: 25 mg via ORAL
  Filled 2015-01-21: qty 1

## 2015-01-21 MED ORDER — GABAPENTIN 400 MG PO CAPS
400.0000 mg | ORAL_CAPSULE | Freq: Two times a day (BID) | ORAL | Status: DC
  Administered 2015-01-21 – 2015-01-25 (×9): 400 mg via ORAL
  Filled 2015-01-21 (×9): qty 1

## 2015-01-21 MED ORDER — HEPARIN (PORCINE) IN NACL 100-0.45 UNIT/ML-% IJ SOLN
900.0000 [IU]/h | INTRAMUSCULAR | Status: DC
  Administered 2015-01-21: 800 [IU]/h via INTRAVENOUS
  Administered 2015-01-24: 900 [IU]/h via INTRAVENOUS
  Filled 2015-01-21 (×3): qty 250

## 2015-01-21 MED ORDER — ONDANSETRON HCL 4 MG/2ML IJ SOLN: 4.0000 mg | Freq: Four times a day (QID) | INTRAMUSCULAR | Status: DC | PRN

## 2015-01-21 MED ORDER — HYDRALAZINE HCL 25 MG PO TABS
25.0000 mg | ORAL_TABLET | Freq: Four times a day (QID) | ORAL | Status: DC | PRN
  Administered 2015-01-21 – 2015-01-22 (×3): 25 mg via ORAL
  Filled 2015-01-21 (×3): qty 1

## 2015-01-21 NOTE — Progress Notes (Signed)
Paged MD, awaiting response.

## 2015-01-21 NOTE — H&P (Signed)
[facility]          History and Physical    PCP:   Wardell Honour, MD   EDP: Francine Graven, M.D.  Chief Complaint:  Chest pain, heart fluttering  HPI: Patient is a 74 year old woman with multiple medical comorbidities including atrial fibrillation with prior admission for rapid ventricular response not on anticoagulation due to history of GI bleed, hypertension, coronary artery disease with bare metal stent to the RCA in March 2014, anxiety who presents to the hospital with the above-mentioned complaints. Of note patient has not followed up with Dr. Harl Bowie since September of last year because she felt like her primary care physician could "check on my heart just as well as a heart doctor could". She says she woke this morning at around 4:30 AM with an oppressive pain over her left chest that got worse after she woke up to go to the bathroom. She also describes a sensation of pain in her neck and down her left arm and breast. She does state that her daughter-in-law called her yesterday with "some bad news" that has made her even more anxious and jittery since. In the emergency department she was found to have an EKG that does not have any acute ischemic changes, a troponin of 0.04, given her heart score of at least 6 we have been asked to admit her for further evaluation and management.  Allergies:   Allergies  Allergen Reactions  . Iohexol Shortness Of Breath    PT STATES CONTRAST ALLERGY CAUSED SHORTNESS OF BREATH IN THE 70'S  . Dye Fdc Red [Red Dye] Other (See Comments)    Pt.states she passed out  . Sulfa Antibiotics Itching  . Codeine Itching and Palpitations      Past Medical History  Diagnosis Date  . Hypertension   . Anxiety and depression   . Asthma   . Fibromyalgia   . History of pneumonia   . Peripheral edema   . Peptic ulcer disease   . Hiatal hernia   . GERD (gastroesophageal reflux disease)   . A-fib     cardioversion and TEE at  [facility]; pt reports DCCV 2014 at [facility] as well.  . 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 Diverticular disease 10/01/2011  . MI, acute, non ST segment elevation 10/02/2011  . GI bleed     ?recurrent--therefore, no anticoagulant  . COPD (chronic obstructive pulmonary disease)   . Lymphedema   . CAD (coronary artery disease) 02/2013    2.75 x 32 mm Rebel bare metal stent in the distal RCA.     Past Surgical History  Procedure Laterality Date  . Abdominal hysterectomy  age 75    nonmalignant reason  . Cholecystectomy    . Abdominal exploration surgery    . Abd tumor removed      states was 10 lbs, benign  . Knee surgery    . Bladder stent    . Esophagogastroduodenoscopy  08/24/2011    Procedure: ESOPHAGOGASTRODUODENOSCOPY (EGD);  Surgeon: Daneil Dolin, MD;  [facility]: [facility];  Service: Endoscopy;  Laterality: N/A;  give phenergan 12.48m iv 30 mins prior to procedure  . Colonoscopy  08/25/2011    Procedure: COLONOSCOPY;  Surgeon: RDaneil Dolin MD;  Location: [facility];  Service: Endoscopy;  Laterality: N/A;  . Colonoscopy  10/03/2011    Procedure: COLONOSCOPY;  Surgeon: RDaneil Dolin MD;  Location: [facility];  Service: Endoscopy;  Laterality: N/A;  NEEDS PHENERGAN 25 MG IV ON CALL  . Colonoscopy  10/04/2011    Procedure: COLONOSCOPY;  Surgeon: Daneil Dolin, MD;  Location: [facility];  Service: Endoscopy;  Laterality: N/A;  Phenergan 12.5 mg ON CALL  . Transthoracic echocardiogram  09/2011    EF 60-65%, septal hypokinesia  . Cardiovascular stress test  09/2011    equivocal result, most likely low risk; pt refused the recommended cardiac cath to follow this up.  . Coronary angioplasty  02/2013  . Percutaneous coronary stent intervention (pci-s)  02/2013    2.75 x 32 mm Rebel bare metal stent in the distal RCA.   [name] Left and right heart catheterization with coronary angiogram N/A 03/03/2013    Procedure: LEFT AND RIGHT HEART CATHETERIZATION WITH CORONARY  ANGIOGRAM;  Surgeon: Burnell Blanks, MD;  Location: [facility] CATH LAB;  Service: Cardiovascular;  Laterality: N/A;    Prior to Admission medications   Medication Sig Start Date End Date Taking? Authorizing Provider  ALPRAZolam Duanne Moron) 1 MG tablet Take 1 tablet (1 mg total) by mouth 4 (four) times daily. 01/07/15  Yes Wardell Honour, MD  aspirin EC 81 MG tablet Take 1 tablet (81 mg total) by mouth daily. HOLD ASPIRIN FOR 1 WEEK GIVEN BLEEDING; THEN OK TO RESUME 09/29/14  Yes Barton Dubois, MD  Black Cohosh-SoyIsoflav-Magnol (ESTROVEN MENOPAUSE RELIEF PO) Take 1 tablet by mouth daily.    Yes Historical Provider, MD  escitalopram (LEXAPRO) 10 MG tablet Take 1 tablet (10 mg total) by mouth daily. 12/08/14  Yes Wardell Honour, MD  FERREX 150 150 MG capsule TAKE ONE CAPSULE BY MOUTH ONCE DAILY 07/21/14  Yes Lysbeth Penner, FNP  gabapentin (NEURONTIN) 400 MG capsule Take 1 capsule (400 mg total) by mouth 2 (two) times daily. 01/07/15  Yes Wardell Honour, MD  lisinopril (PRINIVIL,ZESTRIL) 40 MG tablet Take 1 tablet (40 mg total) by mouth daily. 12/08/14  Yes Wardell Honour, MD  metoprolol tartrate (LOPRESSOR) 25 MG tablet Take one tablet in the morning and two tablets at night Patient taking differently: Take 25-50 mg by mouth 2 (two) times daily. Take one tablet in the morning and two tablets at night 01/07/15  Yes Wardell Honour, MD  nitroGLYCERIN (NITROSTAT) 0.4 MG SL tablet Place 1 tablet (0.4 mg total) under the tongue every 5 (five) minutes as needed for chest pain. 10/21/13  Yes Mary-Margaret Hassell Done, FNP  pantoprazole (PROTONIX) 40 MG tablet Take 1 tablet (40 mg total) by mouth daily. 11/04/14  Yes Arnoldo Lenis, MD  polyethylene glycol [facility] / GLYCOLAX) packet Take 17 g by mouth daily. 09/29/14  Yes Barton Dubois, MD  torsemide (DEMADEX) 20 MG tablet Take 20 mg by mouth daily as needed (fluid).    Yes Historical Provider, MD  hydrocortisone (ANUSOL-HC) 25 MG suppository Place 1 suppository  (25 mg total) rectally 2 (two) times daily. USE FOR 3 DAYS Patient not taking: Reported on 01/21/2015 09/29/14   Barton Dubois, MD  pravastatin (PRAVACHOL) 80 MG tablet Take 1 tablet (80 mg total) by mouth daily. Patient not taking: Reported on 01/21/2015 08/12/14   Mary-Margaret Hassell Done, FNP  SYNTHROID 50 MCG tablet 1 po every other day Patient taking differently: Take 50 mcg by mouth every other day.  07/29/14   Mary-Margaret Hassell Done, FNP    Social History:  reports that she quit smoking about 19 years ago. Her smoking use included Cigarettes. She has  a 15 pack-year smoking history. She has never used smokeless tobacco. She reports that she does not drink alcohol or use illicit drugs.  Family History  Problem Relation Age of Onset  . Diabetes Mother     Deceased  . Hypertension Mother   . Cancer Father     Deceased  . Colon cancer Neg Hx   . Coronary artery disease Mother   . Heart failure Mother     Review of Systems:  Constitutional: Denies fever, chills, diaphoresis, appetite change and fatigue.  HEENT: Denies photophobia, eye pain, redness, hearing loss, ear pain, congestion, sore throat, rhinorrhea, sneezing, mouth sores, trouble swallowing, neck pain, neck stiffness and tinnitus.   Respiratory: Denies SOB, DOE, cough,   and wheezing.   Cardiovascular: Positive for chest pain, palpitations and leg swelling.  Gastrointestinal: Denies nausea, vomiting, abdominal pain, diarrhea, constipation, blood in stool and abdominal distention.  Genitourinary: Denies dysuria, urgency, frequency, hematuria, flank pain and difficulty urinating.  Endocrine: Denies: hot or cold intolerance, sweats, changes in hair or nails, polyuria, polydipsia. Musculoskeletal: Denies myalgias, back pain, joint swelling, arthralgias and gait problem.  Skin: Denies pallor, rash and wound.  Neurological: Denies dizziness, seizures, syncope, weakness, light-headedness, numbness and headaches.  Hematological: Denies  adenopathy. Easy bruising, personal or family bleeding history  Psychiatric/Behavioral: Denies suicidal ideation, mood changes, confusion, nervousness, sleep disturbance and agitation   Physical Exam: Blood pressure 188/91, pulse 55, temperature 97.7 F (36.5 C), temperature source Oral, resp. rate 16, height 5' (1.524 m), weight 84.8 kg (186 lb 15.2 oz), SpO2 99 %. General: Alert, awake, oriented 3 HEENT: Normocephalic, atraumatic, pupils equal round and reactive to light, extraocular movements intact Neck: Supple, no JVD, no lymphadenopathy, no bruits, no goiter Cardiovascular: Regular rate and rhythm, no murmurs, rubs or gallops. Lungs: Clear to auscultation bilaterally. Abdomen: Soft, nontender, nondistended, positive bowel sounds, no mass or organomegaly noted. Extremities: Left 2+ nonpitting edema bilaterally, positive pedal pulses. Neurologic: Grossly intact and nonfocal  Labs on Admission:  Results for orders placed or performed during the hospital encounter of 01/21/15 (from the past 48 hour(s))  Basic metabolic panel     Status: Abnormal   Collection Time: 01/21/15  8:30 AM  Result Value Ref Range   Sodium 140 135 - 145 mmol/L   Potassium 3.8 3.5 - 5.1 mmol/L   Chloride 101 101 - 111 mmol/L   CO2 30 22 - 32 mmol/L   Glucose, Bld 134 (H) 65 - 99 mg/dL   BUN 16 6 - 20 mg/dL   Creatinine, Ser 0.96 0.44 - 1.00 mg/dL   Calcium 8.6 (L) 8.9 - 10.3 mg/dL   GFR calc non Af Amer 57 (L) >60 mL/min   GFR calc Af Amer >60 >60 mL/min    Comment: (NOTE) The eGFR has been calculated using the CKD EPI equation. This calculation has not been validated in all clinical situations. eGFR's persistently <60 mL/min signify possible Chronic Kidney Disease.    Anion gap 9 5 - 15  Troponin I     Status: Abnormal   Collection Time: 01/21/15  8:30 AM  Result Value Ref Range   Troponin I 0.04 (H) <0.031 ng/mL    Comment:        PERSISTENTLY INCREASED TROPONIN VALUES IN THE RANGE OF  0.04-0.49 ng/mL CAN BE SEEN IN:       -UNSTABLE ANGINA       -CONGESTIVE HEART FAILURE       -MYOCARDITIS       -  CHEST TRAUMA       -ARRYHTHMIAS       -LATE PRESENTING MYOCARDIAL INFARCTION       -COPD   CLINICAL FOLLOW-UP RECOMMENDED.   CBC with Differential     Status: Abnormal   Collection Time: 01/21/15  8:30 AM  Result Value Ref Range   WBC 7.3 4.0 - 10.5 K/uL   RBC 4.48 3.87 - 5.11 MIL/uL   Hemoglobin 10.9 (L) 12.0 - 15.0 g/dL   HCT 36.2 36.0 - 46.0 %   MCV 80.8 78.0 - 100.0 fL   MCH 24.3 (L) 26.0 - 34.0 pg   MCHC 30.1 30.0 - 36.0 g/dL   RDW 16.2 (H) 11.5 - 15.5 %   Platelets 196 150 - 400 K/uL   Neutrophils Relative % 67 43 - 77 %   Neutro Abs 4.8 1.7 - 7.7 K/uL   Lymphocytes Relative 27 12 - 46 %   Lymphs Abs 2.0 0.7 - 4.0 K/uL   Monocytes Relative 5 3 - 12 %   Monocytes Absolute 0.4 0.1 - 1.0 K/uL   Eosinophils Relative 0 0 - 5 %   Eosinophils Absolute 0.0 0.0 - 0.7 K/uL   Basophils Relative 1 0 - 1 %   Basophils Absolute 0.0 0.0 - 0.1 K/uL  Brain natriuretic peptide     Status: Abnormal   Collection Time: 01/21/15  8:30 AM  Result Value Ref Range   B Natriuretic Peptide 243.0 (H) 0.0 - 100.0 pg/mL  Troponin I-serum (0, 3, 6 hours)     Status: Abnormal   Collection Time: 01/21/15 11:45 AM  Result Value Ref Range   Troponin I 0.23 (H) <0.031 ng/mL    Comment:        PERSISTENTLY INCREASED TROPONIN VALUES IN THE RANGE OF 0.04-0.49 ng/mL CAN BE SEEN IN:       -UNSTABLE ANGINA       -CONGESTIVE HEART FAILURE       -MYOCARDITIS       -CHEST TRAUMA       -ARRYHTHMIAS       -LATE PRESENTING MYOCARDIAL INFARCTION       -COPD   CLINICAL FOLLOW-UP RECOMMENDED.     Radiological Exams on Admission: Dg Chest 2 View  01/21/2015   CLINICAL DATA:  Acute chest pain.  EXAM: CHEST  2 VIEW  COMPARISON:  Nov 03, 2013.  FINDINGS: The heart size and mediastinal contours are within normal limits. Both lungs are clear. No pneumothorax or pleural effusion is noted. The  visualized skeletal structures are unremarkable.  IMPRESSION: No active cardiopulmonary disease.   Electronically Signed   By: Marijo Conception, M.D.   On: 01/21/2015 08:52    Assessment/Plan Principal Problem:   Chest pain Active Problems:   Atrial fibrillation   HTN (hypertension), benign   Major depressive disorder, recurrent episode, severe   CAD (coronary artery disease) of artery bypass graft   Chronic diastolic CHF (congestive heart failure)    Chest pain -She has typical and atypical features. -She does have a history of coronary artery disease status post bare metal stent to the RCA in 2014, has not had cardiology follow-up since September 2015. -Agree with admission for rule out given heart score of 6. -We'll admit to telemetry, we'll cycle troponins, repeat EKG in the morning. -Given her history of coronary artery disease and some typical features, will request cardiology consultation to see if stress test may be necessary if she rules out. -Continue aspirin.  Atrial  fibrillation -Currently rate controlled on a beta blocker. -Not on anticoagulation secondary to history of GI bleed.  Hypertension -Fair control, monitor in the hospital on home medications.  DVT prophylaxis -Subcutaneous heparin  CODE STATUS -Full code as discussed with patient and husband at bedside.   Time Spent on Admission: 85 minutes  [facility] Pager: [phone] 01/21/2015, 1:13 PM

## 2015-01-21 NOTE — Progress Notes (Signed)
Called by RN that patient has elevated troponin 0.43, patient seen and examined and at this time she denies any chest pain. Blood pressure has been elevated as metoprolol was held due to bradycardia. Repeat EKG showed T inversions and Q waves in inferior leads, called and discussed with cardiology fellow on call Dr. Eula Fried, who reviewed the EKG. Did not find any new change from previous EKG, but due to increasing troponin recommend starting heparin infusion. Patient is not having any chest pain at this time. Also he recommends calling cardiology in a.m. to decide if patient is to transfer to [facility].  Plan Start heparin infusion per pharmacy Hydralazine 25 mg by mouth every 6 hours when necessary for BP greater than 160/100.

## 2015-01-21 NOTE — Progress Notes (Addendum)
Pt complaining of new onset of chest pain. She inquired whether she was in A Fib. Monitor showed she is running Sinus [place]. Pt states it feels as if something is squeezing her heart. Pt given Xanax in night medication regimen. New troponin level is 0.43.  EKG ordered and awaiting results before contacting MD.

## 2015-01-21 NOTE — ED Notes (Addendum)
Per EMS pt was in a-fib on monitor upon arrival to home. After getting pt out of house and into truck pt had converted to a sinus rhythm. Pt states that she is feeling better but still c/o  [facility] that has been ongoing for a while. Pt unable to determine if it is any worse than usual. Pt reports starting new B/P medication about two weeks ago, states swelling in legs has increased since then. Pt denies chest pain at this time.

## 2015-01-21 NOTE — Progress Notes (Signed)
ANTICOAGULATION CONSULT NOTE - Initial Consult  Pharmacy Consult for Heparin Indication: chest pain/ACS  Allergies  Allergen Reactions  . Iohexol Shortness Of Breath    PT STATES CONTRAST ALLERGY CAUSED SHORTNESS OF BREATH IN THE 70'S  . Dye Fdc Red [Red Dye] Other (See Comments)    Pt.states she passed out  . Sulfa Antibiotics Itching  . Codeine Itching and Palpitations    Patient Measurements: Height: 5' (152.4 cm) Weight: 186 lb 15.2 oz (84.8 kg) IBW/kg (Calculated) : 45.5 Heparin Dosing Weight: 65.3 kg  Vital Signs: Temp: 98.3 F (36.8 C) (07/29 2007) Temp Source: Oral (07/29 2007) BP: 183/89 mmHg (07/29 2007) Pulse Rate: 49 (07/29 2007)  Labs:  Recent Labs  01/21/15 0830 01/21/15 1145 01/21/15 1739 01/21/15 2019  HGB 10.9*  --   --   --   HCT 36.2  --   --   --   PLT 196  --   --   --   CREATININE 0.96  --   --   --   TROPONINI 0.04* 0.23* 0.41* 0.43*    Estimated Creatinine Clearance: 49.7 mL/min (by C-G formula based on Cr of 0.96).   Medical History: Past Medical History  Diagnosis Date  . Hypertension   . Anxiety and depression   . Asthma   . Fibromyalgia   . History of pneumonia   . Peripheral edema   . Peptic ulcer disease   . Hiatal hernia   . GERD (gastroesophageal reflux disease)   . A-fib     cardioversion and TEE at [facility]; pt reports DCCV 2014 at [facility] as well.  . 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 Diverticular disease 10/01/2011  . MI, acute, non ST segment elevation 10/02/2011  . GI bleed     ?recurrent--therefore, no anticoagulant  . COPD (chronic obstructive pulmonary disease)   . Lymphedema   . CAD (coronary artery disease) 02/2013    2.75 x 32 mm Rebel bare metal stent in the distal RCA.     Medications:  Prescriptions prior to admission  Medication Sig Dispense Refill Last Dose  . ALPRAZolam (XANAX) 1 MG tablet Take 1 tablet (1 mg total) by mouth 4 (four) times daily. 120 tablet 1 01/20/2015 at Unknown  time  . aspirin EC 81 MG tablet Take 1 tablet (81 mg total) by mouth daily. HOLD ASPIRIN FOR 1 WEEK GIVEN BLEEDING; THEN OK TO RESUME   01/21/2015 at Unknown time  . Black Cohosh-SoyIsoflav-Magnol (ESTROVEN MENOPAUSE RELIEF PO) Take 1 tablet by mouth daily.    01/20/2015 at Unknown time  . escitalopram (LEXAPRO) 10 MG tablet Take 1 tablet (10 mg total) by mouth daily. 30 tablet 1 01/20/2015 at Unknown time  . FERREX 150 150 MG capsule TAKE ONE CAPSULE BY MOUTH ONCE DAILY 30 capsule 5 01/20/2015 at Unknown time  . gabapentin (NEURONTIN) 400 MG capsule Take 1 capsule (400 mg total) by mouth 2 (two) times daily. 60 capsule 2 01/20/2015 at Unknown time  . lisinopril (PRINIVIL,ZESTRIL) 40 MG tablet Take 1 tablet (40 mg total) by mouth daily. 90 tablet 1 01/20/2015 at Unknown time  . metoprolol tartrate (LOPRESSOR) 25 MG tablet Take one tablet in the morning and two tablets at night (Patient taking differently: Take 25-50 mg by mouth 2 (two) times daily. Take one tablet in the morning and two tablets at night) 90 tablet 2 01/20/2015 at 800  . nitroGLYCERIN (NITROSTAT) 0.4 MG SL tablet Place 1 tablet (0.4 mg total) under the  tongue every 5 (five) minutes as needed for chest pain. 30 tablet 0 01/21/2015 at Unknown time  . pantoprazole (PROTONIX) 40 MG tablet Take 1 tablet (40 mg total) by mouth daily. 90 tablet 1 01/20/2015 at Unknown time  . polyethylene glycol (MIRALAX / GLYCOLAX) packet Take 17 g by mouth daily. 30 each 0 Past Week at Unknown time  . torsemide (DEMADEX) 20 MG tablet Take 20 mg by mouth daily as needed (fluid).    Past Month at Unknown time  . hydrocortisone (ANUSOL-HC) 25 MG suppository Place 1 suppository (25 mg total) rectally 2 (two) times daily. USE FOR 3 DAYS (Patient not taking: Reported on 01/21/2015) 6 suppository 0 Taking  . pravastatin (PRAVACHOL) 80 MG tablet Take 1 tablet (80 mg total) by mouth daily. (Patient not taking: Reported on 01/21/2015) 90 tablet 1 Taking  . SYNTHROID 50 MCG  tablet 1 po every other day (Patient taking differently: Take 50 mcg by mouth every other day. ) 90 tablet 1 01/19/2015    Assessment: Elevated troponin 0.43, denies any chest pain. Repeat EKG showed T inversions and Q waves in inferior leads,  Cardiology recommended starting heparin infusion.  cardiology in a.m. to decide if patient is to transfer to [facility]. Labs reviewed PTA medications reviewed, no anticoagulants noted. Patient received heparin 5000 units SQ at 2200.  Goal of Therapy:  Heparin level 0.3-0.7 units/ml Monitor platelets by anticoagulation protocol: Yes   Plan:  Give 2000 units bolus x 1 Start heparin infusion at 800 units/hr Check anti-Xa level in 8 hours and daily while on heparin Continue to monitor H&H and platelets  Abner Greenspan, Sebrena Engh Bennett 01/21/2015,11:32 PM

## 2015-01-21 NOTE — Consult Note (Signed)
CARDIOLOGY CONSULT NOTE   Patient ID: Sharon Compton MRN: [id number] DOB/AGE: [birth year] 74 y.o.  Admit Date: 01/21/2015 Referring Physician: PTH Primary Physician: Wardell Honour, MD Consulting Cardiologist: Jenkins Rouge MD Primary Cardiologist: Carlyle Dolly MD Reason for Consultation: Chest Pain with know hx of CAD  Clinical Summary Sharon Compton is a 74 y.o.female history of atrial fibrillation, admission in May 2015 for A. Fib RVR, supple, pressures and was able to only tolerate amiodarone.CHADS VASC Score of 4 but not on anticoagulation due to hx of GIB;; hypertension, CAD, with bare-metal stent to the right coronary artery in March of 2014, and anxiety being treated by behavioral health,who presents to the emergency room with recurrent chest pain.  She has not been seen by Dr. Harl Bowie.  Since September 2015.  Most recent telephone note states that she wished to have her primary care physician.  Continue her cardiology needs and she was having trouble with transportation.  She states that she had been feeling her heart fluttering for the last couple of days.  Get a phone call from a family member (ex-daughter-in-law), who told her that her brother had died, she began to feel some pressure in her chest after that.  She goes on to discuss in detail the deaths of other family members and becomes upset.once refocus, she states that she awoke around 4:30 this a.m. to use the bathroom.  She began to feel some fluttering and pressure in her chest.she took 4 baby aspirin and nitroglycerin.  After approximately 10 minutes, with no relief of discomfort in her chest, she took additional nitroglycerin dose, q. 5 minutes x2 more doses. She spoke with 911 operator, who advised to take another 4 baby aspirin.  When EMS arrived, her chest pain, had subsided, although "I still felt that there".  She denies any medical noncompliance.  She lives with her husband, who is a chain smoker, but she herself does  not smoke.  To the emergency room blood pressure was 161/75 heart rate 63, O2 sat 94%.  She was afebrile.potassium 3.8, sodium 140, glucose 134.  creatinine  0.96.initial troponin 0.04.hemoglobin 10.9, hematocrit 36.2, no evidence of leukocytosis.BNP 243.  EKG revealing normal sinus rhythm, without evidence of ACS, nonspecific intraventricular conduction delay noted in the inferior leads. Chest x-ray revealed no active disease, CHF, or pneumonia.the patient was admitted to rule out cardiac etiology for her recurrent chest pain.  Currently she is without any recurrent chest pain.  She does state that her left breast feels heavy and "full of fluid."  She states, that she has been feeling her body be full of fluid for the last several months.  She occasionally has some dyspnea on exertion.  No other symptoms.  Allergies  Allergen Reactions  . Iohexol Shortness Of Breath    PT STATES CONTRAST ALLERGY CAUSED SHORTNESS OF BREATH IN THE 70'S  . Dye Fdc Red [Red Dye] Other (See Comments)    Pt.states she passed out  . Sulfa Antibiotics Itching  . Codeine Itching and Palpitations    Medications Scheduled Medications: . ALPRAZolam  1 mg Oral QID  . aspirin EC  81 mg Oral Daily  . escitalopram  10 mg Oral Daily  . gabapentin  400 mg Oral BID  . heparin  5,000 Units Subcutaneous 3 times per day  . iron polysaccharides  150 mg Oral Daily  . levothyroxine  50 mcg Oral QODAY  . lisinopril  40 mg Oral Daily  . metoprolol tartrate  25 mg Oral Daily   And  . metoprolol tartrate  50 mg Oral QHS  . pantoprazole  40 mg Oral Daily  . polyethylene glycol  17 g Oral Daily  . torsemide  20 mg Oral Daily     Infusions:     PRN Medications:  acetaminophen, ondansetron (ZOFRAN) IV   Past Medical History  Diagnosis Date  . Hypertension   . Anxiety and depression   . Asthma   . Fibromyalgia   . History of pneumonia   . Peripheral edema   . Peptic ulcer disease   . Hiatal hernia   . GERD  (gastroesophageal reflux disease)   . A-fib     cardioversion and [facility]EE at [facility]; pt reports DCCV 2014 at [facility] as well.  . 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 Diverticular disease 10/01/2011  . MI, acute, non ST segment elevation 10/02/2011  . GI bleed     ?recurrent--therefore, no anticoagulant  . COPD (chronic obstructive pulmonary disease)   . Lymphedema   . CAD (coronary artery disease) 02/2013    2.75 x 32 mm Rebel bare metal stent in the distal RCA.     Past Surgical History  Procedure Laterality Date  . Abdominal hysterectomy  age 55    nonmalignant reason  . Cholecystectomy    . Abdominal exploration surgery    . Abd tumor removed      states was 10 lbs, benign  . Knee surgery    . Bladder stent    . Esophagogastroduodenoscopy  08/24/2011    Procedure: ESOPHAGOGASTRODUODENOSCOPY (EGD);  Surgeon: Daneil Dolin, MD;  [facility];  Service: Endoscopy;  Laterality: N/A;  give phenergan 12.5mg  iv 30 mins prior to procedure  . Colonoscopy  08/25/2011    Procedure: COLONOSCOPY;  Surgeon: Daneil Dolin, MD;  Location: [facility];  Service: Endoscopy;  Laterality: N/A;  . Colonoscopy  10/03/2011    Procedure: COLONOSCOPY;  Surgeon: Daneil Dolin, MD;  Location: [facility];  Service: Endoscopy;  Laterality: N/A;  NEEDS PHENERGAN 25 MG IV ON CALL  . Colonoscopy  10/04/2011    Procedure: COLONOSCOPY;  Surgeon: Daneil Dolin, MD;  Location: [facility];  Service: Endoscopy;  Laterality: N/A;  Phenergan 12.5 mg ON CALL  . Transthoracic echocardiogram  09/2011    EF 60-65%, septal hypokinesia  . Cardiovascular stress test  09/2011    equivocal result, most likely low risk; pt refused the recommended cardiac cath to follow this up.  . Coronary angioplasty  02/2013  . Percutaneous coronary stent intervention (pci-s)  02/2013    2.75 x 32 mm Rebel bare metal stent in the distal RCA.   [name] Left and right heart catheterization with coronary angiogram N/A 03/03/2013      Procedure: LEFT AND RIGHT HEART CATHETERIZATION WITH CORONARY ANGIOGRAM;  Surgeon: Burnell Blanks, MD;  Location: [facility] CATH LAB;  Service: Cardiovascular;  Laterality: N/A;    Family History  Problem Relation Age of Onset  . Diabetes Mother     Deceased  . Hypertension Mother   . Cancer Father     Deceased  . Colon cancer Neg Hx   . Coronary artery disease Mother   . Heart failure Mother     Social History Sharon Compton reports that she quit smoking about 19 years ago. Her smoking use included Cigarettes. She has a 15 pack-year smoking history. She has never used smokeless tobacco. Sharon Compton reports that  she does not drink alcohol.  Review of Systems Complete review of systems are found to be negative unless outlined in H&P above.  Physical Examination Blood pressure 188/91, pulse 55, temperature 97.7 F (36.5 C), temperature source Oral, resp. rate 16, height 5' (1.524 m), weight 186 lb 15.2 oz (84.8 kg), SpO2 99 %. No intake or output data in the 24 hours ending 01/21/15 1216  Telemetry:normal sinus rhythm.  QPY:PPJKDTO in no acute distress. HEENT: Conjunctiva and lids normal, oropharynx clear with moist mucosa. Neck: Supple, no elevated JVP or carotid bruits, no thyromegaly. Lungs: Bilateral crackles and frequent coughing.. Cardiac: Regular rate and rhythm, frequent extrasystole., no S3 or significant systolic murmur, no pericardial rub. Abdomen: Soft, nontender, no hepatomegaly, bowel sounds present, no guarding or rebound.obese. Extremities: No pitting edema, distal pulses 2+. Skin: Warm and dry. Musculoskeletal: No kyphosis. Neuropsychiatric: Alert and oriented x3, affect grossly appropriate.anxious, describes her psychosocial issues in detail.  Prior Cardiac Testing/Procedures:  1. Cardiac Cath 03/04/2015 Left main: No obstructive disease.  Left Anterior Descending Artery: Large caliber vessel that courses to the apex. The mid vessel has a 30% stenosis. The  first diagonal branch has mild plaque. The second diagonal branch is small to moderate in caliber with 50% ostial stenosis.  Circumflex Artery: Moderate caliber vessel with no obstructive disease.  Right Coronary Artery: Large dominant vessel with serial 30% proximal and mid stenoses. The distal vessel has a 99% stenosis followed by a 80% stenosis just before the bifurcation. The Posterolateral branch is the larger of the two distal branches and has mild diffuse plaque. The PDA is small in caliber with 40% ostial stenosis.  Left Ventricular Angiogram:LVEF=65-70%  Impression: 1. Severe single vessel CAD presenting with dyspnea on exertion/chest pain c/w class III angina 2. Abnormal stress test with evidence of ischemia 3. Preserved LV systolic function 4. Successful PTCA/bare metal stent x 1 distal RCA  2. Echocardiogram 10/08/2012 (dictated from scanned [facility] from [facility]) 1.  There is mild left ventricular hypertrophy.  Ejection fraction 65%.  There are no focal wall motion abnormalities.  There is grade 1 diastolic dysfunction. 2.  Right ventricular cavity size is normal.  Right ventricular function is normal. 3.  The aortic valve structures normal.  There is trivial aortic insufficiency. 4.  The tricuspid valve leaflets are normal.  There is trivial tricuspid regurg. 5. No significant pericardial effusion. 6. The inferior vena cava is not well seen. Sharon Compton).  Lab Results  Basic Metabolic Panel:  Recent Labs Lab 01/21/15 0830  NA 140  K 3.8  CL 101  CO2 30  GLUCOSE 134*  BUN 16  CREATININE 0.96  CALCIUM 8.6*   CBC:  Recent Labs Lab 01/21/15 0830  WBC 7.3  NEUTROABS 4.8  HGB 10.9*  HCT 36.2  MCV 80.8  PLT 196    Cardiac Enzymes:  Recent Labs Lab 01/21/15 0830  TROPONINI 0.04*    Radiology: Dg Chest 2 View  01/21/2015   CLINICAL DATA:  Acute chest pain.  EXAM: CHEST  2 VIEW  COMPARISON:  Nov 03, 2013.  FINDINGS: The heart size and mediastinal  contours are within normal limits. Both lungs are clear. No pneumothorax or pleural effusion is noted. The visualized skeletal structures are unremarkable.  IMPRESSION: No active cardiopulmonary disease.   Electronically Signed   By: Marijo Conception, M.D.   On: 01/21/2015 08:52     ECG: NSR without evidence of ACS, mild intraventricular conduction delay in the inferior leads.  Impression and Recommendations  1. Chest pain:typical and atypical features, associated with anxiety related to a phone call due to the death of a relative.  However, she when she awoke early in the morning.  She felt some chest pressure and fluttering.  After 3 nitroglycerin,he pain did subside, but she still felt it minimally.  She describes heaviness in her left breast.  She feels as if her body is retaining fluid.  EKG reveals normal sinus rhythm, with one positive troponin of 0.04.  She has been placed on 25 mg of metoprolol x1, and started back on daily dose of 50 mg at at bedtime.  She continues on aspirin 81 mg daily. Review of home medications, has her taking pravastatin daily, which has not been restarted. We will do so. She is on ACE inhibitor.Currently pain free with he exception of left breast heaviness.    If the patient continues to have recurrent chest pain, cardiac enzymes become positive, or there are acute EKG changes, would recommend transfer to come for possible cardiac catheterization.currently there is no indication to transfer for catheterization as she is stable and essentially pain-free.  2. CAD: Patient has a history of a bare metal stent to the right coronary artery in March of 2014.she has not seen cardiology since September of 2015.  She is normally followed in [place].  She is advised to follow-up with a cardiologist for ongoing management as outpatient.  She is going to think about it.  3. Hx of Atrial flutter: She is currently in normal sinus rhythm.  She is not on anticoagulation due to history  of GI bleed.  4. Breast fullness: Patient states she feels that her left breast.  It is full of fluid.  It is pendulous, but does not appear to be overly edematous.  I have asked her to followup with her primary care physician to consider mammogram.  She states she is afraid it will pull out her coronary stent.  I have reassured her that this would not be the case.  5. Hypertension: Patient's blood pressure was hypertensive on admission.currently blood pressure remains elevated on initial BP check.  On arrival to the floor.  We will continue to evaluate blood pressure recordings.  Continue lisinopril, beta blocker, may need to titrate up the blood pressures not well-controlled.  6. Anxiety: She continues to suffer with this.  This may also be contributing to her chest discomfort after a phone call that upset her.  7. Second hand smoke exposure: Lives with husband who is chain smoker. She has crackles in her lungs with frequent coughing.   Signed: Phill Myron. Lawrence NP [place]  01/21/2015, 12:16 PM Co-Sign MD  Patient examined chart reviewed.  Obese white female no murmur and normal lung exam.  Mild LE edema She has very atypical symptoms History of CAD with BMS to RCA in 2014.  Troponin essentially negative and no  Acute ECG changes.  D/C in am with medical Rx Can f/u with primary in [place] with Dr Tawanna Sat group but asked her to f/u in [place] office as well.  She wants the oldest Dr that goes there which would be Dr Domenic Polite will send note to office scheduler   Jenkins Rouge

## 2015-01-21 NOTE — ED Notes (Addendum)
Pt c/o "mashing in my chest" around 0430 and called EMS. Pt took 4 Aspirins 81mg . Pt reports SOB but says she "stays like that a lot", dizziness and her left hand went numb. Pt took a total of 3 Nitroglycerin SL from 0500 to 0800.

## 2015-01-21 NOTE — ED Notes (Signed)
Attempted to call report to floor RN. Nurse unable to take report at this time.

## 2015-01-21 NOTE — ED Notes (Signed)
Pt ambulated around nursing station x1 no elevation in HR noted. Pt o2 sats 96-97% through out ambulation. Once sitting down in the bed o2 sats droppped to high 80's. Pt placed on 2L via [place]. MD notified of outcome.

## 2015-01-21 NOTE — Progress Notes (Signed)
Notified hospitalist pt's new troponin level at 17:39 is 0.41. Awaiting response.

## 2015-01-21 NOTE — Progress Notes (Addendum)
Notified Lab and spoke to [facility] to inquire where the results of the Tropinin was.  Voiced to her I had spoke with an lab tech eariler when they were due because she inquired if we needed the troponin that often.   Voiced to the actual tech that came to draw the specimen and told her yes they were scheduled Q3 hours.  She verbalized understanding.  At 5:30 there was no result in and it is now time for the next draw.  Sharon Compton stated that she would run it now.  Dr. Jerilee Hoh notified of the situation, and of the last documented trop of 0.23

## 2015-01-21 NOTE — Progress Notes (Signed)
Dr. Rogue Bussing and responded and stated to notify Cardiology of new result.

## 2015-01-21 NOTE — ED Provider Notes (Signed)
CSN: [id number]     Arrival date & time 01/21/15  0813 History   First MD Initiated Contact with Patient 01/21/15 0815     Chief Complaint  Patient presents with  . Chest Pain      HPI  Pt was seen at 0815. Per EMS and pt report, c/o gradual onset and resolution of one episode of chest "pain" that began at 0430 this morning. Describes the CP as "mashing in my chest," and was associated with SOB and diaphoresis. Pt states she woke up to go to the bathroom when she developed symptoms. Pt took ASA and SL ntg x3 total (between 0500-0800) which "finally made it better." States her pedal edema has increased "since BP meds were changed" approximately 2 weeks ago. EMS states pt developed afib/RVR, rate in 130's, "just moving onto our stretcher." Afib/RVR resolved with rest. Denies palpitations, no cough, no abd pain, no N/V/D, no back pain.    Past Medical History  Diagnosis Date  . Hypertension   . Anxiety and depression   . Asthma   . Fibromyalgia   . History of pneumonia   . Peripheral edema   . Peptic ulcer disease   . Hiatal hernia   . GERD (gastroesophageal reflux disease)   . A-fib     cardioversion and TEE at [facility]; pt reports DCCV 2014 at [facility] as well.  . 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 Diverticular disease 10/01/2011  . MI, acute, non ST segment elevation 10/02/2011  . GI bleed     ?recurrent--therefore, no anticoagulant  . COPD (chronic obstructive pulmonary disease)   . Lymphedema   . CAD (coronary artery disease) 02/2013    2.75 x 32 mm Rebel bare metal stent in the distal RCA.    Past Surgical History  Procedure Laterality Date  . Abdominal hysterectomy  age 68    nonmalignant reason  . Cholecystectomy    . Abdominal exploration surgery    . Abd tumor removed      states was 10 lbs, benign  . Knee surgery    . Bladder stent    . Esophagogastroduodenoscopy  08/24/2011    Procedure: ESOPHAGOGASTRODUODENOSCOPY (EGD);  Surgeon: Daneil Dolin, MD;   [facility];  Service: Endoscopy;  Laterality: N/A;  give phenergan 12.5mg  iv 30 mins prior to procedure  . Colonoscopy  08/25/2011    Procedure: COLONOSCOPY;  Surgeon: Daneil Dolin, MD;  Location: [facility];  Service: Endoscopy;  Laterality: N/A;  . Colonoscopy  10/03/2011    Procedure: COLONOSCOPY;  Surgeon: Daneil Dolin, MD;  Location: [facility];  Service: Endoscopy;  Laterality: N/A;  NEEDS PHENERGAN 25 MG IV ON CALL  . Colonoscopy  10/04/2011    Procedure: COLONOSCOPY;  Surgeon: Daneil Dolin, MD;  Location: [facility];  Service: Endoscopy;  Laterality: N/A;  Phenergan 12.5 mg ON CALL  . Transthoracic echocardiogram  09/2011    EF 60-65%, septal hypokinesia  . Cardiovascular stress test  09/2011    equivocal result, most likely low risk; pt refused the recommended cardiac cath to follow this up.  . Coronary angioplasty  02/2013  . Percutaneous coronary stent intervention (pci-s)  02/2013    2.75 x 32 mm Rebel bare metal stent in the distal RCA.   [name] Left and right heart catheterization with coronary angiogram N/A 03/03/2013    Procedure: LEFT AND RIGHT HEART CATHETERIZATION WITH CORONARY ANGIOGRAM;  Surgeon: Burnell Blanks, MD;  Location:  [facility];  Service: Cardiovascular;  Laterality: N/A;   Family History  Problem Relation Age of Onset  . Diabetes Mother     Deceased  . Hypertension Mother   . Cancer Father     Deceased  . Colon cancer Neg Hx   . Coronary artery disease Mother   . Heart failure Mother    History  Substance Use Topics  . Smoking status: Former Smoker -- 1.00 packs/day for 15 years    Types: Cigarettes    Quit date: 06/26/1995  . Smokeless tobacco: Never Used  . Alcohol Use: No   OB History    Gravida Para Term Preterm AB TAB SAB Ectopic Multiple Living   3 3 3       3      Review of Systems ROS: Statement: All systems negative except as marked or noted in the HPI; Constitutional: Negative for fever and chills. +generalized  weakness.; ; Eyes: Negative for eye pain, redness and discharge. ; ; ENMT: Negative for ear pain, hoarseness, nasal congestion, sinus pressure and sore throat. ; ; Cardiovascular: Negative for palpitations,. +CP, diaphoresis, SOB, peripheral edema. ; ; Respiratory: Negative for cough, wheezing and stridor. ; ; Gastrointestinal: Negative for nausea, vomiting, diarrhea, abdominal pain, blood in stool, hematemesis, jaundice and rectal bleeding. . ; ; Genitourinary: Negative for dysuria, flank pain and hematuria. ; ; Musculoskeletal: Negative for back pain and neck pain. Negative for swelling and trauma.; ; Skin: Negative for pruritus, rash, abrasions, blisters, bruising and skin lesion.; ; Neuro: Negative for headache, lightheadedness and neck stiffness. Negative for altered level of consciousness , altered mental status, extremity weakness, paresthesias, involuntary movement, seizure and syncope.      Allergies  Iohexol; Dye fdc red; Sulfa antibiotics; and Codeine  Home Medications   Prior to Admission medications   Medication Sig Start Date End Date Taking? Authorizing Provider  ALPRAZolam Duanne Moron) 1 MG tablet Take 1 tablet (1 mg total) by mouth 4 (four) times daily. 01/07/15  Yes Wardell Honour, MD  aspirin EC 81 MG tablet Take 1 tablet (81 mg total) by mouth daily. HOLD ASPIRIN FOR 1 WEEK GIVEN BLEEDING; THEN OK TO RESUME 09/29/14  Yes Barton Dubois, MD  Black Cohosh-SoyIsoflav-Magnol (ESTROVEN MENOPAUSE RELIEF PO) Take 1 tablet by mouth daily.    Yes Historical Provider, MD  escitalopram (LEXAPRO) 10 MG tablet Take 1 tablet (10 mg total) by mouth daily. 12/08/14  Yes Wardell Honour, MD  FERREX 150 150 MG capsule TAKE ONE CAPSULE BY MOUTH ONCE DAILY 07/21/14  Yes Lysbeth Penner, FNP  gabapentin (NEURONTIN) 400 MG capsule Take 1 capsule (400 mg total) by mouth 2 (two) times daily. 01/07/15  Yes Wardell Honour, MD  lisinopril (PRINIVIL,ZESTRIL) 40 MG tablet Take 1 tablet (40 mg total) by mouth  daily. 12/08/14  Yes Wardell Honour, MD  metoprolol tartrate (LOPRESSOR) 25 MG tablet Take one tablet in the morning and two tablets at night Patient taking differently: Take 25-50 mg by mouth 2 (two) times daily. Take one tablet in the morning and two tablets at night 01/07/15  Yes Wardell Honour, MD  nitroGLYCERIN (NITROSTAT) 0.4 MG SL tablet Place 1 tablet (0.4 mg total) under the tongue every 5 (five) minutes as needed for chest pain. 10/21/13  Yes Mary-Margaret Hassell Done, FNP  pantoprazole (PROTONIX) 40 MG tablet Take 1 tablet (40 mg total) by mouth daily. 11/04/14  Yes Arnoldo Lenis, MD  polyethylene glycol [facility] / GLYCOLAX) packet Take 17  g by mouth daily. 09/29/14  Yes Barton Dubois, MD  torsemide (DEMADEX) 20 MG tablet Take 20 mg by mouth daily as needed (fluid).    Yes Historical Provider, MD  hydrocortisone (ANUSOL-HC) 25 MG suppository Place 1 suppository (25 mg total) rectally 2 (two) times daily. USE FOR 3 DAYS Patient not taking: Reported on 01/21/2015 09/29/14   Barton Dubois, MD  pravastatin (PRAVACHOL) 80 MG tablet Take 1 tablet (80 mg total) by mouth daily. Patient not taking: Reported on 01/21/2015 08/12/14   Mary-Margaret Hassell Done, FNP  SYNTHROID 50 MCG tablet 1 po every other day Patient taking differently: Take 50 mcg by mouth every other day.  07/29/14   Mary-Margaret Hassell Done, FNP   BP 164/69 mmHg  Pulse 54  Temp(Src) 97.9 F (36.6 C) (Oral)  Resp 18  Ht 5' (1.524 m)  Wt 188 lb (85.276 kg)  BMI 36.72 kg/m2  SpO2 100% Physical Exam  0820: Physical examination:  Nursing notes reviewed; Vital signs and O2 SAT reviewed;  Constitutional: Well developed, Well nourished, Well hydrated, In no acute distress; Head:  Normocephalic, atraumatic; Eyes: EOMI, PERRL, No scleral icterus; ENMT: Mouth and pharynx normal, Mucous membranes moist; Neck: Supple, Full range of motion, No lymphadenopathy; Cardiovascular: Regular rate and rhythm, No gallop; Respiratory: Breath sounds clear & equal  bilaterally, No wheezes.  Speaking full sentences with ease, Normal respiratory effort/excursion; Chest: Nontender, Movement normal; Abdomen: Soft, Nontender, Nondistended, Normal bowel sounds; Genitourinary: No CVA tenderness; Extremities: Pulses normal, No tenderness, +2 pedal edema bilat. No calf asymmetry.; Neuro: AA&Ox3, Major CN grossly intact.  Speech clear. No gross focal motor or sensory deficits in extremities.; Skin: Color normal, Warm, Dry.   ED Course  Procedures     EKG Interpretation   Date/Time:  Friday January 21 2015 08:26:31 EDT Ventricular Rate:  60 PR Interval:  167 QRS Duration: 91 QT Interval:  472 QTC Calculation: 472 R Axis:   64 Text Interpretation:  Sinus rhythm Artifact When compared with ECG of  09/27/2014 QT has shortened and Premature atrial complexes are no longer  Present Confirmed by [facility]  MD, Nunzio Cory [phone]) on 01/21/2015 8:34:10 AM      MDM  MDM Reviewed: previous chart, nursing note and vitals Reviewed previous: labs and ECG Interpretation: labs, ECG and x-ray   Results for orders placed or performed during the hospital encounter of 68/34/19  Basic metabolic panel  Result Value Ref Range   Sodium 140 135 - 145 mmol/L   Potassium 3.8 3.5 - 5.1 mmol/L   Chloride 101 101 - 111 mmol/L   CO2 30 22 - 32 mmol/L   Glucose, Bld 134 (H) 65 - 99 mg/dL   BUN 16 6 - 20 mg/dL   Creatinine, Ser 0.96 0.44 - 1.00 mg/dL   Calcium 8.6 (L) 8.9 - 10.3 mg/dL   GFR calc non Af Amer 57 (L) >60 mL/min   GFR calc Af Amer >60 >60 mL/min   Anion gap 9 5 - 15  Troponin I  Result Value Ref Range   Troponin I 0.04 (H) <0.031 ng/mL  CBC with Differential  Result Value Ref Range   WBC 7.3 4.0 - 10.5 K/uL   RBC 4.48 3.87 - 5.11 MIL/uL   Hemoglobin 10.9 (L) 12.0 - 15.0 g/dL   HCT 36.2 36.0 - 46.0 %   MCV 80.8 78.0 - 100.0 fL   MCH 24.3 (L) 26.0 - 34.0 pg   MCHC 30.1 30.0 - 36.0 g/dL   RDW 16.2 (H) 11.5 -  15.5 %   Platelets 196 150 - 400 K/uL   Neutrophils  Relative % 67 43 - 77 %   Neutro Abs 4.8 1.7 - 7.7 K/uL   Lymphocytes Relative 27 12 - 46 %   Lymphs Abs 2.0 0.7 - 4.0 K/uL   Monocytes Relative 5 3 - 12 %   Monocytes Absolute 0.4 0.1 - 1.0 K/uL   Eosinophils Relative 0 0 - 5 %   Eosinophils Absolute 0.0 0.0 - 0.7 K/uL   Basophils Relative 1 0 - 1 %   Basophils Absolute 0.0 0.0 - 0.1 K/uL  Brain natriuretic peptide  Result Value Ref Range   B Natriuretic Peptide 243.0 (H) 0.0 - 100.0 pg/mL   Dg Chest 2 View 01/21/2015   CLINICAL DATA:  Acute chest pain.  EXAM: CHEST  2 VIEW  COMPARISON:  Nov 03, 2013.  FINDINGS: The heart size and mediastinal contours are within normal limits. Both lungs are clear. No pneumothorax or pleural effusion is noted. The visualized skeletal structures are unremarkable.  IMPRESSION: No active cardiopulmonary disease.   Electronically Signed   By: Marijo Conception, M.D.   On: 01/21/2015 08:52    0925:   Pt ambulated with Sats remaining 96-97% R/A, and HR remained 60's. Pt's Sats did decrease to high 80's when she laid down on the stretcher. Pt denies symptoms currently. Multiple ACS risk factors; will observation admit. Dx and testing d/w pt.  Questions answered.  Verb understanding, agreeable to admit. T/C to [facility] Dr. Jerilee Hoh, case discussed, including:  HPI, pertinent PM/SHx, VS/PE, dx testing, ED course and treatment:  Agreeable to admit, requests to write temporary orders, obtain observation tele bed to team [facility].      Francine Graven, DO 01/24/15 1215

## 2015-01-21 NOTE — Progress Notes (Signed)
Notified Dr. Humphrey Rolls that pt has elevated troponin that has changed from 0.23 to 0.41. Made him aware that previous MD stated to notify Cardiology. Dr. Humphrey Rolls stated there will be no new orders given at this time and to continue to monitor pt.

## 2015-01-22 ENCOUNTER — Observation Stay (HOSPITAL_COMMUNITY): Payer: Medicare Other

## 2015-01-22 DIAGNOSIS — I25810 Atherosclerosis of coronary artery bypass graft(s) without angina pectoris: Secondary | ICD-10-CM | POA: Diagnosis present

## 2015-01-22 DIAGNOSIS — F332 Major depressive disorder, recurrent severe without psychotic features: Secondary | ICD-10-CM | POA: Diagnosis present

## 2015-01-22 DIAGNOSIS — I209 Angina pectoris, unspecified: Secondary | ICD-10-CM | POA: Diagnosis not present

## 2015-01-22 DIAGNOSIS — Z79899 Other long term (current) drug therapy: Secondary | ICD-10-CM | POA: Diagnosis not present

## 2015-01-22 DIAGNOSIS — I2510 Atherosclerotic heart disease of native coronary artery without angina pectoris: Secondary | ICD-10-CM | POA: Diagnosis not present

## 2015-01-22 DIAGNOSIS — Z955 Presence of coronary angioplasty implant and graft: Secondary | ICD-10-CM | POA: Diagnosis not present

## 2015-01-22 DIAGNOSIS — I25700 Atherosclerosis of coronary artery bypass graft(s), unspecified, with unstable angina pectoris: Secondary | ICD-10-CM | POA: Diagnosis not present

## 2015-01-22 DIAGNOSIS — K219 Gastro-esophageal reflux disease without esophagitis: Secondary | ICD-10-CM | POA: Diagnosis present

## 2015-01-22 DIAGNOSIS — Z7722 Contact with and (suspected) exposure to environmental tobacco smoke (acute) (chronic): Secondary | ICD-10-CM | POA: Diagnosis present

## 2015-01-22 DIAGNOSIS — I5032 Chronic diastolic (congestive) heart failure: Secondary | ICD-10-CM | POA: Diagnosis not present

## 2015-01-22 DIAGNOSIS — I214 Non-ST elevation (NSTEMI) myocardial infarction: Secondary | ICD-10-CM | POA: Diagnosis not present

## 2015-01-22 DIAGNOSIS — I10 Essential (primary) hypertension: Secondary | ICD-10-CM | POA: Diagnosis not present

## 2015-01-22 DIAGNOSIS — R079 Chest pain, unspecified: Secondary | ICD-10-CM | POA: Diagnosis not present

## 2015-01-22 DIAGNOSIS — M797 Fibromyalgia: Secondary | ICD-10-CM | POA: Diagnosis present

## 2015-01-22 DIAGNOSIS — F419 Anxiety disorder, unspecified: Secondary | ICD-10-CM | POA: Diagnosis present

## 2015-01-22 DIAGNOSIS — Z7982 Long term (current) use of aspirin: Secondary | ICD-10-CM | POA: Diagnosis not present

## 2015-01-22 DIAGNOSIS — Z87891 Personal history of nicotine dependence: Secondary | ICD-10-CM | POA: Diagnosis not present

## 2015-01-22 DIAGNOSIS — J449 Chronic obstructive pulmonary disease, unspecified: Secondary | ICD-10-CM | POA: Diagnosis present

## 2015-01-22 DIAGNOSIS — I4891 Unspecified atrial fibrillation: Secondary | ICD-10-CM

## 2015-01-22 DIAGNOSIS — I480 Paroxysmal atrial fibrillation: Secondary | ICD-10-CM | POA: Diagnosis not present

## 2015-01-22 DIAGNOSIS — I252 Old myocardial infarction: Secondary | ICD-10-CM | POA: Diagnosis not present

## 2015-01-22 LAB — CBC
HCT: 34.2 % — ABNORMAL LOW (ref 36.0–46.0)
Hemoglobin: 10.2 g/dL — ABNORMAL LOW (ref 12.0–15.0)
MCH: 24.1 pg — AB (ref 26.0–34.0)
MCHC: 29.8 g/dL — AB (ref 30.0–36.0)
MCV: 80.7 fL (ref 78.0–100.0)
PLATELETS: 167 10*3/uL (ref 150–400)
RBC: 4.24 MIL/uL (ref 3.87–5.11)
RDW: 16.4 % — AB (ref 11.5–15.5)
WBC: 5.2 10*3/uL (ref 4.0–10.5)

## 2015-01-22 LAB — PROTIME-INR
INR: 1.09 (ref 0.00–1.49)
Prothrombin Time: 14.3 seconds (ref 11.6–15.2)

## 2015-01-22 LAB — MRSA PCR SCREENING: MRSA by PCR: NEGATIVE

## 2015-01-22 LAB — TROPONIN I
Troponin I: 0.36 ng/mL — ABNORMAL HIGH (ref <0.031)
Troponin I: 0.3 ng/mL — ABNORMAL HIGH (ref <0.031)
Troponin I: 0.24 ng/mL — ABNORMAL HIGH (ref <0.031)

## 2015-01-22 LAB — HEPARIN LEVEL (UNFRACTIONATED)
Heparin Unfractionated: 0.21 IU/mL — ABNORMAL LOW (ref 0.30–0.70)
Heparin Unfractionated: 0.57 IU/mL (ref 0.30–0.70)

## 2015-01-22 MED ORDER — ZOLPIDEM TARTRATE 5 MG PO TABS
5.0000 mg | ORAL_TABLET | Freq: Every evening | ORAL | Status: DC | PRN
  Administered 2015-01-22: 5 mg via ORAL
  Filled 2015-01-22: qty 1

## 2015-01-22 MED ORDER — METOPROLOL TARTRATE 25 MG PO TABS
25.0000 mg | ORAL_TABLET | Freq: Two times a day (BID) | ORAL | Status: DC
  Administered 2015-01-22 – 2015-01-24 (×5): 25 mg via ORAL
  Filled 2015-01-22 (×5): qty 1

## 2015-01-22 MED ORDER — ALPRAZOLAM 0.5 MG PO TABS
0.5000 mg | ORAL_TABLET | Freq: Three times a day (TID) | ORAL | Status: DC | PRN
Start: 2015-01-22 — End: 2015-01-25
  Administered 2015-01-24: 0.5 mg via ORAL
  Filled 2015-01-22: qty 1

## 2015-01-22 NOTE — Progress Notes (Signed)
ANTICOAGULATION CONSULT NOTE - Follow-up  Pharmacy Consult for Heparin Indication: chest pain/ACS  Allergies  Allergen Reactions  . Iohexol Shortness Of Breath    PT STATES CONTRAST ALLERGY CAUSED SHORTNESS OF BREATH IN THE 70'S  . Dye Fdc Red [Red Dye] Other (See Comments)    Pt.states she passed out  . Sulfa Antibiotics Itching  . Codeine Itching and Palpitations    Patient Measurements: Height: 5' (152.4 cm) Weight: 182 lb 5.1 oz (82.7 kg) IBW/kg (Calculated) : 45.5 Heparin Dosing Weight: 65.3 kg  Vital Signs: Temp: 98.3 F (36.8 C) (07/30 1526) Temp Source: Oral (07/30 1526) BP: 177/70 mmHg (07/30 1656) Pulse Rate: 62 (07/30 1656)  Labs:  Recent Labs  01/21/15 0830  01/21/15 2317 01/22/15 0508 01/22/15 0831 01/22/15 1057 01/22/15 1611  HGB 10.9*  --   --  10.2*  --   --   --   HCT 36.2  --   --  34.2*  --   --   --   PLT 196  --   --  167  --   --   --   LABPROT 14.3  --   --   --   --   --   --   INR 1.09  --   --   --   --   --   --   HEPARINUNFRC  --   --   --   --  0.21*  --  0.57  CREATININE 0.96  --   --   --   --   --   --   TROPONINI 0.04*  < > 0.36* 0.30*  --  0.24*  --   < > = values in this interval not displayed.  Estimated Creatinine Clearance: 49 mL/min (by C-G formula based on Cr of 0.96).  Assessment: 9 yof continues on IV heparin for ACS. Heparin level is now therapeutic at 0.57. No bleeding noted.   Goal of Therapy:  Heparin level 0.3-0.7 units/ml Monitor platelets by anticoagulation protocol: Yes   Plan:  - Continue heparin gtt at 900 units/hr - F/u AM heparin level to confirm dosing - Continue daily heparin level and CBC  Salome Arnt, PharmD, BCPS Pager # [phone] 01/22/2015 5:01 PM

## 2015-01-22 NOTE — Progress Notes (Signed)
ANTICOAGULATION CONSULT NOTE - Initial Consult  Pharmacy Consult for Heparin Indication: chest pain/ACS  Allergies  Allergen Reactions  . Iohexol Shortness Of Breath    PT STATES CONTRAST ALLERGY CAUSED SHORTNESS OF BREATH IN THE 70'S  . Dye Fdc Red [Red Dye] Other (See Comments)    Pt.states she passed out  . Sulfa Antibiotics Itching  . Codeine Itching and Palpitations    Patient Measurements: Height: 5' (152.4 cm) Weight: 186 lb 15.2 oz (84.8 kg) IBW/kg (Calculated) : 45.5 Heparin Dosing Weight: 65.3 kg  Vital Signs: Temp: 97.9 F (36.6 C) (07/30 0628) Temp Source: Oral (07/30 0628) BP: 107/43 mmHg (07/30 0816) Pulse Rate: 49 (07/30 0816)  Labs:  Recent Labs  01/21/15 0830  01/21/15 2019 01/21/15 2317 01/22/15 0508 01/22/15 0831  HGB 10.9*  --   --   --  10.2*  --   HCT 36.2  --   --   --  34.2*  --   PLT 196  --   --   --  167  --   LABPROT 14.3  --   --   --   --   --   INR 1.09  --   --   --   --   --   HEPARINUNFRC  --   --   --   --   --  0.21*  CREATININE 0.96  --   --   --   --   --   TROPONINI 0.04*  < > 0.43* 0.36* 0.30*  --   < > = values in this interval not displayed.  Estimated Creatinine Clearance: 49.7 mL/min (by C-G formula based on Cr of 0.96).   Medical History: Past Medical History  Diagnosis Date  . Hypertension   . Anxiety and depression   . Asthma   . Fibromyalgia   . History of pneumonia   . Peripheral edema   . Peptic ulcer disease   . Hiatal hernia   . GERD (gastroesophageal reflux disease)   . A-fib     cardioversion and TEE at [facility]; pt reports DCCV 2014 at [facility] as well.  . 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 Diverticular disease 10/01/2011  . MI, acute, non ST segment elevation 10/02/2011  . GI bleed     ?recurrent--therefore, no anticoagulant  . COPD (chronic obstructive pulmonary disease)   . Lymphedema   . CAD (coronary artery disease) 02/2013    2.75 x 32 mm Rebel bare metal stent in the distal  RCA.     Medications:  Prescriptions prior to admission  Medication Sig Dispense Refill Last Dose  . ALPRAZolam (XANAX) 1 MG tablet Take 1 tablet (1 mg total) by mouth 4 (four) times daily. 120 tablet 1 01/20/2015 at Unknown time  . aspirin EC 81 MG tablet Take 1 tablet (81 mg total) by mouth daily. HOLD ASPIRIN FOR 1 WEEK GIVEN BLEEDING; THEN OK TO RESUME   01/21/2015 at Unknown time  . Black Cohosh-SoyIsoflav-Magnol (ESTROVEN MENOPAUSE RELIEF PO) Take 1 tablet by mouth daily.    01/20/2015 at Unknown time  . escitalopram (LEXAPRO) 10 MG tablet Take 1 tablet (10 mg total) by mouth daily. 30 tablet 1 01/20/2015 at Unknown time  . FERREX 150 150 MG capsule TAKE ONE CAPSULE BY MOUTH ONCE DAILY 30 capsule 5 01/20/2015 at Unknown time  . gabapentin (NEURONTIN) 400 MG capsule Take 1 capsule (400 mg total) by mouth 2 (two) times daily. 60 capsule 2 01/20/2015 at  Unknown time  . lisinopril (PRINIVIL,ZESTRIL) 40 MG tablet Take 1 tablet (40 mg total) by mouth daily. 90 tablet 1 01/20/2015 at Unknown time  . metoprolol tartrate (LOPRESSOR) 25 MG tablet Take one tablet in the morning and two tablets at night (Patient taking differently: Take 25-50 mg by mouth 2 (two) times daily. Take one tablet in the morning and two tablets at night) 90 tablet 2 01/20/2015 at 800  . nitroGLYCERIN (NITROSTAT) 0.4 MG SL tablet Place 1 tablet (0.4 mg total) under the tongue every 5 (five) minutes as needed for chest pain. 30 tablet 0 01/21/2015 at Unknown time  . pantoprazole (PROTONIX) 40 MG tablet Take 1 tablet (40 mg total) by mouth daily. 90 tablet 1 01/20/2015 at Unknown time  . polyethylene glycol (MIRALAX / GLYCOLAX) packet Take 17 g by mouth daily. 30 each 0 Past Week at Unknown time  . torsemide (DEMADEX) 20 MG tablet Take 20 mg by mouth daily as needed (fluid).    Past Month at Unknown time  . hydrocortisone (ANUSOL-HC) 25 MG suppository Place 1 suppository (25 mg total) rectally 2 (two) times daily. USE FOR 3 DAYS (Patient  not taking: Reported on 01/21/2015) 6 suppository 0 Taking  . pravastatin (PRAVACHOL) 80 MG tablet Take 1 tablet (80 mg total) by mouth daily. (Patient not taking: Reported on 01/21/2015) 90 tablet 1 Taking  . SYNTHROID 50 MCG tablet 1 po every other day (Patient taking differently: Take 50 mcg by mouth every other day. ) 90 tablet 1 01/19/2015    Assessment: Elevated troponin 0.43, denies any chest pain. Repeat EKG showed T inversions and Q waves in inferior leads,  Cardiology recommended starting heparin infusion.  cardiology in a.m. to decide if patient is to transfer to [facility]. Labs reviewed PTA medications reviewed, no anticoagulants noted. HL below goal this AM..  Goal of Therapy:  Heparin level 0.3-0.7 units/ml Monitor platelets by anticoagulation protocol: Yes   Plan:  Increase heparin rate to 900 units/hour Heparin level in 6 hours and daily while on heparin Monitor CBC, platelets F/U [facility] plan  Abner Greenspan, Kemara Quigley Bennett 01/22/2015,9:54 AM

## 2015-01-22 NOTE — Progress Notes (Signed)
Report called to Elana Moskueda at [facility] in room preparing to transport patient.

## 2015-01-22 NOTE — Progress Notes (Signed)
Patient was given dose of Metoprolol and hydralazine this morning. At this time BP running lower systolic 897 and 915. Discussed Bp with Dr. Jerilee Hoh. WIll hold Lisinopril but give demadex.

## 2015-01-22 NOTE — Progress Notes (Signed)
[facility] PROGRESS NOTE  Sharon Compton EMV:[id number] DOB: 03/17/1941 DOA: 01/21/2015 PCP: Wardell Honour, MD  Assessment/Plan: Chest pain -With typical and atypical features. -History of coronary artery disease with bare metal stent to the RCA in 2014. -Troponin of 0.04 on admission, rose to 0.43 overnight in addition to patient experiencing chest pain. -On-call hospitalist discussed with on-call cardiology fellow at [facility] who recommended initiation of heparin drip and consideration to transfer to [facility] in the morning. -I have personally discussed this case with Dr. Meda Coffee with cardiology who agrees with transfer of patient to [facility] for further cardiac workup. They will accept patient in transfer. -Patient initially refused transfer to [facility] and was going to sign out AMA, however after I discussed the reasons why, she is agreeable to the transfer and to having any procedures that are deemed necessary.   Atrial fibrillation -Has been rate controlled on a beta blocker. -Not on anticoagulation secondary to a history of GI bleed.  Hypertension -Fair control.  Code Status: Full code Family Communication: Patient only  Disposition Plan: Transfer to [facility]   Consultants:  Cardiology   Antibiotics:  None   Subjective: Currently chest pain-free, although did experience substernal chest discomfort overnight.  Objective: Filed Vitals:   01/22/15 5300 01/22/15 0628 01/22/15 0812 01/22/15 0816  BP: 189/64 184/75 108/38 107/43  Pulse: 61 65 54 49  Temp:  97.9 F (36.6 C)    TempSrc:  Oral    Resp:  16    Height:      Weight:      SpO2:  95%      Intake/Output Summary (Last 24 hours) at 01/22/15 1442 Last data filed at 01/22/15 0700  Gross per 24 hour  Intake  76.13 ml  Output   1100 ml  Net -1023.87 ml   Filed Weights   01/21/15 0821 01/21/15 1041  Weight: 85.276 kg (188 lb) 84.8 kg (186 lb 15.2 oz)    Exam:   General:  Alert,  awake, oriented 3  Cardiovascular: Regular rate and rhythm  Respiratory: Clear to auscultation bilaterally  Abdomen: Soft, nontender, nondistended, positive bowel sounds  Extremities: 1-2+ edema bilaterally, left greater than right   Neurologic:  Grossly intact and nonfocal  Data Reviewed: Basic Metabolic Panel:  Recent Labs Lab 01/21/15 0830 01/21/15 2019  NA 140  --   K 3.8  --   CL 101  --   CO2 30  --   GLUCOSE 134*  --   BUN 16  --   CREATININE 0.96  --   CALCIUM 8.6*  --   MG  --  1.9   Liver Function Tests: No results for input(s): AST, ALT, ALKPHOS, BILITOT, PROT, ALBUMIN in the last 168 hours. No results for input(s): LIPASE, AMYLASE in the last 168 hours. No results for input(s): AMMONIA in the last 168 hours. CBC:  Recent Labs Lab 01/21/15 0830 01/22/15 0508  WBC 7.3 5.2  NEUTROABS 4.8  --   HGB 10.9* 10.2*  HCT 36.2 34.2*  MCV 80.8 80.7  PLT 196 167   Cardiac Enzymes:  Recent Labs Lab 01/21/15 1739 01/21/15 2019 01/21/15 2317 01/22/15 0508 01/22/15 1057  TROPONINI 0.41* 0.43* 0.36* 0.30* 0.24*   BNP (last 3 results)  Recent Labs  01/21/15 0830  BNP 243.0*    ProBNP (last 3 results) No results for input(s): PROBNP in the last 8760 hours.  CBG: No results for input(s): GLUCAP in the last  168 hours.  No results found for this or any previous visit (from the past 240 hour(s)).   Studies: Dg Chest 2 View  01/21/2015   CLINICAL DATA:  Acute chest pain.  EXAM: CHEST  2 VIEW  COMPARISON:  Nov 03, 2013.  FINDINGS: The heart size and mediastinal contours are within normal limits. Both lungs are clear. No pneumothorax or pleural effusion is noted. The visualized skeletal structures are unremarkable.  IMPRESSION: No active cardiopulmonary disease.   Electronically Signed   By: Marijo Conception, M.D.   On: 01/21/2015 08:52    Scheduled Meds: . ALPRAZolam  1 mg Oral QID  . aspirin EC  81 mg Oral Daily  . escitalopram  10 mg Oral Daily  .  gabapentin  400 mg Oral BID  . iron polysaccharides  150 mg Oral Daily  . levothyroxine  50 mcg Oral QODAY  . lisinopril  40 mg Oral Daily  . metoprolol tartrate  25 mg Oral BID  . pantoprazole  40 mg Oral Daily  . polyethylene glycol  17 g Oral Daily  . pravastatin  40 mg Oral q1800  . torsemide  20 mg Oral Daily   Continuous Infusions: . heparin 900 Units/hr (01/22/15 0954)    Principal Problem:   Chest pain Active Problems:   Atrial fibrillation   HTN (hypertension), benign   Major depressive disorder, recurrent episode, severe   CAD (coronary artery disease) of artery bypass graft   Chronic diastolic CHF (congestive heart failure)    Time spent: 25 minutes. Greater than 50% of this time was spent in direct contact with the patient coordinating care.    Lelon Frohlich  [facility] Pager [phone]  If 7PM-7AM, please contact night-coverage at [email or web address], password [facility] 01/22/2015, 2:42 PM

## 2015-01-23 DIAGNOSIS — I10 Essential (primary) hypertension: Secondary | ICD-10-CM

## 2015-01-23 DIAGNOSIS — I480 Paroxysmal atrial fibrillation: Secondary | ICD-10-CM

## 2015-01-23 DIAGNOSIS — I209 Angina pectoris, unspecified: Secondary | ICD-10-CM

## 2015-01-23 DIAGNOSIS — I214 Non-ST elevation (NSTEMI) myocardial infarction: Principal | ICD-10-CM

## 2015-01-23 LAB — HEPARIN LEVEL (UNFRACTIONATED): Heparin Unfractionated: 0.68 IU/mL (ref 0.30–0.70)

## 2015-01-23 LAB — CBC
HCT: 33.9 % — ABNORMAL LOW (ref 36.0–46.0)
Hemoglobin: 10.4 g/dL — ABNORMAL LOW (ref 12.0–15.0)
MCH: 24.9 pg — AB (ref 26.0–34.0)
MCHC: 30.7 g/dL (ref 30.0–36.0)
MCV: 81.3 fL (ref 78.0–100.0)
PLATELETS: 193 10*3/uL (ref 150–400)
RBC: 4.17 MIL/uL (ref 3.87–5.11)
RDW: 16.8 % — ABNORMAL HIGH (ref 11.5–15.5)
WBC: 5.7 10*3/uL (ref 4.0–10.5)

## 2015-01-23 MED ORDER — AMLODIPINE BESYLATE 2.5 MG PO TABS
2.5000 mg | ORAL_TABLET | Freq: Every day | ORAL | Status: DC
  Administered 2015-01-23: 2.5 mg via ORAL
  Filled 2015-01-23 (×2): qty 1

## 2015-01-23 NOTE — Progress Notes (Addendum)
Patient Name: Sharon Compton Date of Encounter: 01/23/2015  Principal Problem:   Chest pain Active Problems:   Atrial fibrillation   HTN (hypertension), benign   Major depressive disorder, recurrent episode, severe   CAD (coronary artery disease) of artery bypass graft   Chronic diastolic CHF (congestive heart failure)   Length of Stay: 1  SUBJECTIVE  No more chest pain, but SOB while walking to the restroom.   CURRENT MEDS . ALPRAZolam  1 mg Oral QID  . aspirin EC  81 mg Oral Daily  . escitalopram  10 mg Oral Daily  . gabapentin  400 mg Oral BID  . iron polysaccharides  150 mg Oral Daily  . levothyroxine  50 mcg Oral QODAY  . lisinopril  40 mg Oral Daily  . metoprolol tartrate  25 mg Oral BID  . pantoprazole  40 mg Oral Daily  . polyethylene glycol  17 g Oral Daily  . pravastatin  40 mg Oral q1800  . torsemide  20 mg Oral Daily    OBJECTIVE  Filed Vitals:   01/22/15 1656 01/22/15 2000 01/22/15 2353 01/23/15 0400  BP: 177/70 148/57 144/78 148/57  Pulse: 62 63 66 65  Temp:  97.4 F (36.3 C) 97.5 F (36.4 C) 98.1 F (36.7 C)  TempSrc:  Oral Oral Oral  Resp:  12 11 13   Height:      Weight:    182 lb 1.6 oz (82.6 kg)  SpO2:  94% 93% 93%    Intake/Output Summary (Last 24 hours) at 01/23/15 0908 Last data filed at 01/22/15 2000  Gross per 24 hour  Intake  345.1 ml  Output      0 ml  Net  345.1 ml   Filed Weights   01/21/15 1041 01/22/15 1526 01/23/15 0400  Weight: 186 lb 15.2 oz (84.8 kg) 182 lb 5.1 oz (82.7 kg) 182 lb 1.6 oz (82.6 kg)    PHYSICAL EXAM  General: Pleasant, NAD. Neuro: Alert and oriented X 3. Moves all extremities spontaneously. Psych: Normal affect. HEENT:  Normal  Neck: Supple without bruits or JVD. Lungs:  Resp regular and unlabored, CTA. Heart: RRR no s3, s4, or murmurs. Abdomen: Soft, non-tender, non-distended, BS + x 4.  Extremities: No clubbing, cyanosis or edema. DP/PT/Radials 2+ and equal bilaterally.  Accessory Clinical  Findings  CBC  Recent Labs  01/21/15 0830 01/22/15 0508 01/23/15 0313  WBC 7.3 5.2 5.7  NEUTROABS 4.8  --   --   HGB 10.9* 10.2* 10.4*  HCT 36.2 34.2* 33.9*  MCV 80.8 80.7 81.3  PLT 196 167 161   Basic Metabolic Panel  Recent Labs  01/21/15 0830 01/21/15 2019  NA 140  --   K 3.8  --   CL 101  --   CO2 30  --   GLUCOSE 134*  --   BUN 16  --   CREATININE 0.96  --   CALCIUM 8.6*  --   MG  --  1.9    Recent Labs  01/21/15 2317 01/22/15 0508 01/22/15 1057  TROPONINI 0.36* 0.30* 0.24*   Radiology/Studies  Dg Chest 2 View  01/21/2015   CLINICAL DATA:  Acute chest pain.  EXAM: CHEST  2 VIEW  COMPARISON:  Nov 03, 2013.  FINDINGS: The heart size and mediastinal contours are within normal limits. Both lungs are clear. No pneumothorax or pleural effusion is noted. The visualized skeletal structures are unremarkable.  IMPRESSION: No active cardiopulmonary disease.   Electronically Signed  By: Marijo Conception, M.D.   On: 01/21/2015 08:52   TELE: SR  ECG: SR, old inferior MI, unchanged from prior     ASSESSMENT AND PLAN  1. NSTEMI - with h/o CAD and BMS to RCA in 2014 - troponin now downtrending, we will schedule for a left cardiac cath for tomorrow.   2. Hx of Atrial flutter: She is currently in normal sinus rhythm. She is not on anticoagulation due to history of GI bleed.  3. Hypertension: elevated, I will add amlodipine 2.5 mg po daily  4. Second hand smoke exposure: Lives with husband who is chain smoker. She has crackles in her lungs with frequent coughing.    Signed, Dorothy Spark MD, [facility] 01/23/2015

## 2015-01-23 NOTE — Care Management Note (Addendum)
Case Management Note  Patient Details  Name: Sharon Compton MRN: [id number] Date of Birth: 1941/06/25  Subjective/Objective:                  Admitted with CP, NSTEMI   Action/Plan:  Return to home when medically stable. CM to f/u with d/c needs. Expected Discharge Date:                  Expected Discharge Plan:  Home/Self Care  In-House Referral:   CSW  Discharge planning Services  CM Consult  Post Acute Care Choice:    Choice offered to:     DME Arranged:    DME Agency:     HH Arranged:    HH Agency:     Status of Service:  In process, will continue to follow  Medicare Important Message Given:    Date Medicare IM Given:    Medicare IM give by:    Date Additional Medicare IM Given:    Additional Medicare Important Message give by:     If discussed at [place] of Stay Meetings, dates discussed:    Additional Comments:  Cardiac cath. planned for 01/24/2015 . Pt states will need help with transportation to home @ d/c, husband/family will not be able to assist. CSW referral placed by CM.  Sharon Compton (Spouse)  [phone]  Sharin Mons, RN 01/23/2015, 9:37 AM

## 2015-01-23 NOTE — Progress Notes (Signed)
ANTICOAGULATION CONSULT NOTE - Follow-up  Pharmacy Consult for Heparin Indication: chest pain/ACS  Allergies  Allergen Reactions  . Iohexol Shortness Of Breath    PT STATES CONTRAST ALLERGY CAUSED SHORTNESS OF BREATH IN THE 70'S  . Dye Fdc Red [Red Dye] Other (See Comments)    Pt.states she passed out  . Sulfa Antibiotics Itching  . Codeine Itching and Palpitations    Patient Measurements: Height: 5' (152.4 cm) Weight: 182 lb 1.6 oz (82.6 kg) IBW/kg (Calculated) : 45.5 Heparin Dosing Weight: 65.3 kg  Vital Signs: Temp: 98.1 F (36.7 C) (07/31 0400) Temp Source: Oral (07/31 0400) BP: 148/57 mmHg (07/31 0400) Pulse Rate: 65 (07/31 0400)  Labs:  Recent Labs  01/21/15 0830  01/21/15 2317 01/22/15 0508 01/22/15 0831 01/22/15 1057 01/22/15 1611 01/23/15 0313  HGB 10.9*  --   --  10.2*  --   --   --  10.4*  HCT 36.2  --   --  34.2*  --   --   --  33.9*  PLT 196  --   --  167  --   --   --  193  LABPROT 14.3  --   --   --   --   --   --   --   INR 1.09  --   --   --   --   --   --   --   HEPARINUNFRC  --   --   --   --  0.21*  --  0.57 0.68  CREATININE 0.96  --   --   --   --   --   --   --   TROPONINI 0.04*  < > 0.36* 0.30*  --  0.24*  --   --   < > = values in this interval not displayed.  Estimated Creatinine Clearance: 48.9 mL/min (by C-G formula based on Cr of 0.96).  Assessment: 58 yof continues on IV heparin for ACS. Heparin level is now therapeutic x2 at 900 units/hr. No bleeding noted.   Goal of Therapy:  Heparin level 0.3-0.7 units/ml Monitor platelets by anticoagulation protocol: Yes   Plan:  - Continue heparin gtt at 900 units/hr - Continue daily heparin level and CBC  Joya San, PharmD Clinical Pharmacist Pager # [phone] 01/23/2015 8:40 AM

## 2015-01-24 ENCOUNTER — Encounter (HOSPITAL_COMMUNITY)
Admission: EM | Disposition: A | Discharge status: Home / Self Care | Payer: Medicare Other | Source: Home / Self Care | Attending: Cardiology

## 2015-01-24 DIAGNOSIS — I2510 Atherosclerotic heart disease of native coronary artery without angina pectoris: Secondary | ICD-10-CM | POA: Insufficient documentation

## 2015-01-24 HISTORY — PX: CARDIAC CATHETERIZATION: SHX172

## 2015-01-24 LAB — CBC
HEMATOCRIT: 33.4 % — AB (ref 36.0–46.0)
Hemoglobin: 10.1 g/dL — ABNORMAL LOW (ref 12.0–15.0)
MCH: 24.7 pg — AB (ref 26.0–34.0)
MCHC: 30.2 g/dL (ref 30.0–36.0)
MCV: 81.7 fL (ref 78.0–100.0)
PLATELETS: 184 10*3/uL (ref 150–400)
RBC: 4.09 MIL/uL (ref 3.87–5.11)
RDW: 16.9 % — ABNORMAL HIGH (ref 11.5–15.5)
WBC: 6.5 10*3/uL (ref 4.0–10.5)

## 2015-01-24 LAB — POCT ACTIVATED CLOTTING TIME: Activated Clotting Time: 122 seconds

## 2015-01-24 LAB — HEPARIN LEVEL (UNFRACTIONATED): Heparin Unfractionated: 0.6 IU/mL (ref 0.30–0.70)

## 2015-01-24 SURGERY — LEFT HEART CATH AND CORONARY ANGIOGRAPHY
Anesthesia: LOCAL

## 2015-01-24 MED ORDER — DIPHENHYDRAMINE HCL 50 MG/ML IJ SOLN
25.0000 mg | Freq: Once | INTRAMUSCULAR | Status: AC
  Administered 2015-01-24: 25 mg via INTRAVENOUS
  Filled 2015-01-24: qty 1

## 2015-01-24 MED ORDER — HYDRALAZINE HCL 20 MG/ML IJ SOLN
INTRAMUSCULAR | Status: DC | PRN
  Administered 2015-01-24: 10 mg via INTRAVENOUS

## 2015-01-24 MED ORDER — METOPROLOL TARTRATE 50 MG PO TABS
50.0000 mg | ORAL_TABLET | Freq: Two times a day (BID) | ORAL | Status: DC
  Administered 2015-01-24 – 2015-01-25 (×2): 50 mg via ORAL
  Filled 2015-01-24 (×2): qty 1

## 2015-01-24 MED ORDER — FENTANYL CITRATE (PF) 100 MCG/2ML IJ SOLN
INTRAMUSCULAR | Status: AC
  Filled 2015-01-24: qty 4

## 2015-01-24 MED ORDER — SODIUM CHLORIDE 0.9 % IJ SOLN: 3.0000 mL | Freq: Two times a day (BID) | INTRAMUSCULAR | Status: DC

## 2015-01-24 MED ORDER — HEPARIN (PORCINE) IN NACL 2-0.9 UNIT/ML-% IJ SOLN
INTRAMUSCULAR | Status: DC | PRN
  Administered 2015-01-24: 15:00:00

## 2015-01-24 MED ORDER — DIPHENHYDRAMINE HCL 50 MG/ML IJ SOLN: 25.0000 mg | INTRAMUSCULAR | Status: DC

## 2015-01-24 MED ORDER — LABETALOL HCL 5 MG/ML IV SOLN
INTRAVENOUS | Status: AC
  Filled 2015-01-24: qty 4

## 2015-01-24 MED ORDER — SODIUM CHLORIDE 0.9 % IV SOLN: 250.0000 mL | INTRAVENOUS | Status: DC | PRN

## 2015-01-24 MED ORDER — SODIUM CHLORIDE 0.9 % WEIGHT BASED INFUSION
1.0000 mL/kg/h | INTRAVENOUS | Status: DC
  Administered 2015-01-24: 1 mL/kg/h via INTRAVENOUS

## 2015-01-24 MED ORDER — SODIUM CHLORIDE 0.9 % IV SOLN
INTRAVENOUS | Status: DC
  Administered 2015-01-24: 05:00:00 via INTRAVENOUS

## 2015-01-24 MED ORDER — LIDOCAINE HCL (PF) 1 % IJ SOLN
INTRAMUSCULAR | Status: AC
  Filled 2015-01-24: qty 30

## 2015-01-24 MED ORDER — ACETAMINOPHEN 325 MG PO TABS: 650.0000 mg | ORAL_TABLET | ORAL | Status: DC | PRN

## 2015-01-24 MED ORDER — ONDANSETRON HCL 4 MG/2ML IJ SOLN: 4.0000 mg | Freq: Four times a day (QID) | INTRAMUSCULAR | Status: DC | PRN

## 2015-01-24 MED ORDER — HYDRALAZINE HCL 20 MG/ML IJ SOLN
INTRAMUSCULAR | Status: AC
  Filled 2015-01-24: qty 1

## 2015-01-24 MED ORDER — AMLODIPINE BESYLATE 5 MG PO TABS
5.0000 mg | ORAL_TABLET | Freq: Every day | ORAL | Status: DC
  Administered 2015-01-24 – 2015-01-25 (×2): 5 mg via ORAL
  Filled 2015-01-24 (×2): qty 1

## 2015-01-24 MED ORDER — SODIUM CHLORIDE 0.9 % IJ SOLN: 3.0000 mL | INTRAMUSCULAR | Status: DC | PRN

## 2015-01-24 MED ORDER — NITROGLYCERIN 1 MG/10 ML FOR IR/CATH LAB
INTRA_ARTERIAL | Status: DC | PRN
  Administered 2015-01-24: 200 ug

## 2015-01-24 MED ORDER — MIDAZOLAM HCL 2 MG/2ML IJ SOLN
INTRAMUSCULAR | Status: AC
  Filled 2015-01-24: qty 4

## 2015-01-24 MED ORDER — METHYLPREDNISOLONE SODIUM SUCC 125 MG IJ SOLR
125.0000 mg | INTRAMUSCULAR | Status: AC
  Administered 2015-01-24: 125 mg via INTRAVENOUS
  Filled 2015-01-24: qty 2

## 2015-01-24 MED ORDER — LIDOCAINE HCL (PF) 1 % IJ SOLN
INTRAMUSCULAR | Status: DC | PRN
  Administered 2015-01-24: 20 mL via SUBCUTANEOUS

## 2015-01-24 MED ORDER — ASPIRIN 81 MG PO CHEW
81.0000 mg | CHEWABLE_TABLET | ORAL | Status: AC
  Administered 2015-01-24: 81 mg via ORAL
  Filled 2015-01-24: qty 1

## 2015-01-24 MED ORDER — SODIUM CHLORIDE 0.9 % IJ SOLN
3.0000 mL | Freq: Two times a day (BID) | INTRAMUSCULAR | Status: DC
  Administered 2015-01-25: 3 mL via INTRAVENOUS

## 2015-01-24 MED ORDER — FAMOTIDINE IN NACL 20-0.9 MG/50ML-% IV SOLN: 20.0000 mg | INTRAVENOUS | Status: DC

## 2015-01-24 MED ORDER — FAMOTIDINE IN NACL 20-0.9 MG/50ML-% IV SOLN
20.0000 mg | Freq: Once | INTRAVENOUS | Status: AC
  Administered 2015-01-24: 20 mg via INTRAVENOUS
  Filled 2015-01-24: qty 50

## 2015-01-24 MED ORDER — ATORVASTATIN CALCIUM 80 MG PO TABS
80.0000 mg | ORAL_TABLET | Freq: Every day | ORAL | Status: DC
  Filled 2015-01-24: qty 1

## 2015-01-24 MED ORDER — SODIUM CHLORIDE 0.9 % WEIGHT BASED INFUSION: 3.0000 mL/kg/h | INTRAVENOUS | Status: AC

## 2015-01-24 MED ORDER — SODIUM CHLORIDE 0.9 % WEIGHT BASED INFUSION
3.0000 mL/kg/h | INTRAVENOUS | Status: DC
  Administered 2015-01-24: 3 mL/kg/h via INTRAVENOUS

## 2015-01-24 MED ORDER — HEPARIN (PORCINE) IN NACL 2-0.9 UNIT/ML-% IJ SOLN
INTRAMUSCULAR | Status: AC
  Filled 2015-01-24: qty 1000

## 2015-01-24 MED ORDER — FENTANYL CITRATE (PF) 100 MCG/2ML IJ SOLN
INTRAMUSCULAR | Status: DC | PRN
  Administered 2015-01-24: 25 ug via INTRAVENOUS

## 2015-01-24 MED ORDER — IOHEXOL 350 MG/ML SOLN
INTRAVENOUS | Status: DC | PRN
  Administered 2015-01-24: 60 mL via INTRAVENOUS

## 2015-01-24 MED ORDER — MIDAZOLAM HCL 2 MG/2ML IJ SOLN
INTRAMUSCULAR | Status: DC | PRN
  Administered 2015-01-24: 1 mg via INTRAVENOUS

## 2015-01-24 MED ORDER — HEPARIN SODIUM (PORCINE) 5000 UNIT/ML IJ SOLN
5000.0000 [IU] | Freq: Three times a day (TID) | INTRAMUSCULAR | Status: DC
  Administered 2015-01-25: 5000 [IU] via SUBCUTANEOUS
  Filled 2015-01-24: qty 1

## 2015-01-24 MED ORDER — METHYLPREDNISOLONE SODIUM SUCC 125 MG IJ SOLR: 125.0000 mg | INTRAMUSCULAR | Status: DC

## 2015-01-24 MED ORDER — ISOSORBIDE MONONITRATE ER 30 MG PO TB24
30.0000 mg | ORAL_TABLET | Freq: Every day | ORAL | Status: DC
  Administered 2015-01-24 – 2015-01-25 (×2): 30 mg via ORAL
  Filled 2015-01-24 (×2): qty 1

## 2015-01-24 MED ORDER — NITROGLYCERIN 1 MG/10 ML FOR IR/CATH LAB
INTRA_ARTERIAL | Status: AC
  Filled 2015-01-24: qty 10

## 2015-01-24 MED ORDER — LABETALOL HCL 5 MG/ML IV SOLN
10.0000 mg | Freq: Once | INTRAVENOUS | Status: AC
  Administered 2015-01-24: 10 mg via INTRAVENOUS

## 2015-01-24 SURGICAL SUPPLY — 7 items
CATH INFINITI 5FR MULTPACK ANG (CATHETERS) ×2 IMPLANT
KIT HEART LEFT (KITS) ×3 IMPLANT
PACK CARDIAC CATHETERIZATION (CUSTOM PROCEDURE TRAY) ×3 IMPLANT
SHEATH PINNACLE 5F 10CM (SHEATH) ×2 IMPLANT
SYR MEDRAD MARK V 150ML (SYRINGE) ×3 IMPLANT
TRANSDUCER W/STOPCOCK (MISCELLANEOUS) ×3 IMPLANT
WIRE EMERALD 3MM-J .035X150CM (WIRE) ×2 IMPLANT

## 2015-01-24 NOTE — H&P (View-Only) (Signed)
Patient Name: Sharon Compton Date of Encounter: 01/23/2015  Principal Problem:   Chest pain Active Problems:   Atrial fibrillation   HTN (hypertension), benign   Major depressive disorder, recurrent episode, severe   CAD (coronary artery disease) of artery bypass graft   Chronic diastolic CHF (congestive heart failure)   Length of Stay: 1  SUBJECTIVE  No more chest pain, but SOB while walking to the restroom.   CURRENT MEDS . ALPRAZolam  1 mg Oral QID  . aspirin EC  81 mg Oral Daily  . escitalopram  10 mg Oral Daily  . gabapentin  400 mg Oral BID  . iron polysaccharides  150 mg Oral Daily  . levothyroxine  50 mcg Oral QODAY  . lisinopril  40 mg Oral Daily  . metoprolol tartrate  25 mg Oral BID  . pantoprazole  40 mg Oral Daily  . polyethylene glycol  17 g Oral Daily  . pravastatin  40 mg Oral q1800  . torsemide  20 mg Oral Daily    OBJECTIVE  Filed Vitals:   01/22/15 1656 01/22/15 2000 01/22/15 2353 01/23/15 0400  BP: 177/70 148/57 144/78 148/57  Pulse: 62 63 66 65  Temp:  97.4 F (36.3 C) 97.5 F (36.4 C) 98.1 F (36.7 C)  TempSrc:  Oral Oral Oral  Resp:  12 11 13   Height:      Weight:    182 lb 1.6 oz (82.6 kg)  SpO2:  94% 93% 93%    Intake/Output Summary (Last 24 hours) at 01/23/15 0908 Last data filed at 01/22/15 2000  Gross per 24 hour  Intake  345.1 ml  Output      0 ml  Net  345.1 ml   Filed Weights   01/21/15 1041 01/22/15 1526 01/23/15 0400  Weight: 186 lb 15.2 oz (84.8 kg) 182 lb 5.1 oz (82.7 kg) 182 lb 1.6 oz (82.6 kg)    PHYSICAL EXAM  General: Pleasant, NAD. Neuro: Alert and oriented X 3. Moves all extremities spontaneously. Psych: Normal affect. HEENT:  Normal  Neck: Supple without bruits or JVD. Lungs:  Resp regular and unlabored, CTA. Heart: RRR no s3, s4, or murmurs. Abdomen: Soft, non-tender, non-distended, BS + x 4.  Extremities: No clubbing, cyanosis or edema. DP/PT/Radials 2+ and equal bilaterally.  Accessory Clinical  Findings  CBC  Recent Labs  01/21/15 0830 01/22/15 0508 01/23/15 0313  WBC 7.3 5.2 5.7  NEUTROABS 4.8  --   --   HGB 10.9* 10.2* 10.4*  HCT 36.2 34.2* 33.9*  MCV 80.8 80.7 81.3  PLT 196 167 076   Basic Metabolic Panel  Recent Labs  01/21/15 0830 01/21/15 2019  NA 140  --   K 3.8  --   CL 101  --   CO2 30  --   GLUCOSE 134*  --   BUN 16  --   CREATININE 0.96  --   CALCIUM 8.6*  --   MG  --  1.9    Recent Labs  01/21/15 2317 01/22/15 0508 01/22/15 1057  TROPONINI 0.36* 0.30* 0.24*   Radiology/Studies  Dg Chest 2 View  01/21/2015   CLINICAL DATA:  Acute chest pain.  EXAM: CHEST  2 VIEW  COMPARISON:  Nov 03, 2013.  FINDINGS: The heart size and mediastinal contours are within normal limits. Both lungs are clear. No pneumothorax or pleural effusion is noted. The visualized skeletal structures are unremarkable.  IMPRESSION: No active cardiopulmonary disease.   Electronically Signed  By: Marijo Conception, M.D.   On: 01/21/2015 08:52   TELE: SR  ECG: SR, old inferior MI, unchanged from prior     ASSESSMENT AND PLAN  1. NSTEMI - with h/o CAD and BMS to RCA in 2014 - troponin now downtrending, we will schedule for a left cardiac cath for tomorrow.   2. Hx of Atrial flutter: She is currently in normal sinus rhythm. She is not on anticoagulation due to history of GI bleed.  3. Hypertension: elevated, I will add amlodipine 2.5 mg po daily  4. Second hand smoke exposure: Lives with husband who is chain smoker. She has crackles in her lungs with frequent coughing.    Signed, Dorothy Spark MD, [facility] 01/23/2015

## 2015-01-24 NOTE — Progress Notes (Signed)
Patient Profile: 74 y.o.female with history of atrial fibrillation, admission in May 2015 for A. Fib RVR, supple, pressures and was able to only tolerate amiodarone. CHADS VASC Score of 4 but not on anticoagulation due to hx of GIB. Also with h/o hypertension, CAD with bare-metal stent to the right coronary artery in March of 2014, and anxiety being treated by behavioral health, who presented to the emergency room with recurrent chest pain and elevated troponin.  Subjective: Currently CP free. No dyspnea.   Objective: Vital signs in last 24 hours: Temp:  [98.3 F (36.8 C)-98.6 F (37 C)] 98.4 F (36.9 C) (08/01 0500) Pulse Rate:  [56-63] 56 (08/01 0500) Resp:  [16-18] 17 (08/01 0500) BP: (141-153)/(56-78) 153/60 mmHg (08/01 0500) SpO2:  [93 %-95 %] 95 % (08/01 0500) Weight:  [182 lb 8 oz (82.781 kg)] 182 lb 8 oz (82.781 kg) (08/01 0500) Last BM Date: 01/22/15  Intake/Output from previous day:   Intake/Output this shift:    Medications Current Facility-Administered Medications  Medication Dose Route Frequency Provider Last Rate Last Dose  . 0.9 %  sodium chloride infusion  250 mL Intravenous PRN Troy Sine, MD      . 0.9 %  sodium chloride infusion   Intravenous Continuous Troy Sine, MD 75 mL/hr at 01/24/15 0519    . acetaminophen (TYLENOL) tablet 650 mg  650 mg Oral Q4H PRN Erline Hau, MD      . ALPRAZolam Duanne Moron) tablet 0.5 mg  0.5 mg Oral TID PRN Oswald Hillock, MD   0.5 mg at 01/24/15 0539  . ALPRAZolam Duanne Moron) tablet 1 mg  1 mg Oral QID Erline Hau, MD   1 mg at 01/23/15 2056  . amLODipine (NORVASC) tablet 2.5 mg  2.5 mg Oral Daily Dorothy Spark, MD   2.5 mg at 01/23/15 1106  . aspirin EC tablet 81 mg  81 mg Oral Daily Erline Hau, MD   81 mg at 01/23/15 0810  . escitalopram (LEXAPRO) tablet 10 mg  10 mg Oral Daily Erline Hau, MD   10 mg at 01/23/15 0810  . gabapentin (NEURONTIN) capsule 400 mg  400 mg  Oral BID Erline Hau, MD   400 mg at 01/23/15 2056  . heparin ADULT infusion 100 units/mL (25000 units/250 mL)  900 Units/hr Intravenous Continuous Erline Hau, MD 9 mL/hr at 01/24/15 0519 900 Units/hr at 01/24/15 0519  . hydrALAZINE (APRESOLINE) tablet 25 mg  25 mg Oral Q6H PRN Oswald Hillock, MD   25 mg at 01/22/15 1727  . iron polysaccharides (NIFEREX) capsule 150 mg  150 mg Oral Daily Erline Hau, MD   150 mg at 01/23/15 0810  . levothyroxine (SYNTHROID, LEVOTHROID) tablet 50 mcg  50 mcg Oral QODAY Erline Hau, MD   50 mcg at 01/23/15 0810  . lisinopril (PRINIVIL,ZESTRIL) tablet 40 mg  40 mg Oral Daily Erline Hau, MD   40 mg at 01/23/15 3419  . metoprolol tartrate (LOPRESSOR) tablet 25 mg  25 mg Oral BID Oswald Hillock, MD   25 mg at 01/23/15 2055  . ondansetron (ZOFRAN) injection 4 mg  4 mg Intravenous Q6H PRN Erline Hau, MD      . pantoprazole (PROTONIX) EC tablet 40 mg  40 mg Oral Daily Erline Hau, MD   40 mg at 01/23/15 3790  . polyethylene glycol (MIRALAX /  GLYCOLAX) packet 17 g  17 g Oral Daily Erline Hau, MD   17 g at 01/21/15 1223  . pravastatin (PRAVACHOL) tablet 40 mg  40 mg Oral q1800 Lendon Colonel, NP   40 mg at 01/23/15 1714  . sodium chloride 0.9 % injection 3 mL  3 mL Intravenous Q12H Troy Sine, MD      . sodium chloride 0.9 % injection 3 mL  3 mL Intravenous PRN Troy Sine, MD      . torsemide [facility]) tablet 20 mg  20 mg Oral Daily Estela Leonie Green, MD   20 mg at 01/21/15 1223  . zolpidem (AMBIEN) tablet 5 mg  5 mg Oral QHS PRN Dianne Dun, NP   5 mg at 01/22/15 0136    PE: General appearance: alert, cooperative, no distress and moderately obese Neck: no carotid bruit and no JVD Lungs: clear to auscultation bilaterally Heart: regular rate and rhythm and 1/6 SM at LUSB Extremities: no LEE Pulses: 2+ and symmetric Skin: warm and  dry Neurologic: Grossly normal  Lab Results:   Recent Labs  01/22/15 0508 01/23/15 0313 01/24/15 0334  WBC 5.2 5.7 6.5  HGB 10.2* 10.4* 10.1*  HCT 34.2* 33.9* 33.4*  PLT 167 193 184   BMET  Recent Labs  01/21/15 0830  NA 140  K 3.8  CL 101  CO2 30  GLUCOSE 134*  BUN 16  CREATININE 0.96  CALCIUM 8.6*   PT/INR  Recent Labs  01/21/15 0830  LABPROT 14.3  INR 1.09   Cardiac Panel (last 3 results)  Recent Labs  01/21/15 2317 01/22/15 0508 01/22/15 1057  TROPONINI 0.36* 0.30* 0.24*    Assessment/Plan  Principal Problem:   Chest pain Active Problems:   Atrial fibrillation   HTN (hypertension), benign   Major depressive disorder, recurrent episode, severe   CAD (coronary artery disease) of artery bypass graft   Chronic diastolic CHF (congestive heart failure)   NSTEMI (non-ST elevated myocardial infarction)   1. NSTEMI: elevated troponins x 3. CP free on IV heparin. Plan for [facility] +/- PCI today with Dr. Claiborne Billings. Continue ASA, statin, BB and ACE-I.  2. PAF: currently in NSR. HR well controlled on metoprolol. CHA2DS2 VASc score is 4 but not an anticoagulation candidate 2/2 h/o GIB.  3. HTN: moderately elevated this am. Metoprolol,  Lisinopril and amlodipine scheduled to be given this am. Amlodipine added yesterday. Continue to monitor.     LOS: 2 days    Brittainy M. Ladoris Gene 01/24/2015 6:43 AM

## 2015-01-24 NOTE — Progress Notes (Signed)
ANTICOAGULATION CONSULT NOTE - Follow-up  Pharmacy Consult for Heparin Indication: chest pain/ACS  Allergies  Allergen Reactions  . Iohexol Shortness Of Breath    PT STATES CONTRAST ALLERGY CAUSED SHORTNESS OF BREATH IN THE 70'S  . Dye Fdc Red [Red Dye] Other (See Comments)    Pt.states she passed out  . Sulfa Antibiotics Itching  . Codeine Itching and Palpitations    Patient Measurements: Height: 5' (152.4 cm) Weight: 182 lb 8 oz (82.781 kg) IBW/kg (Calculated) : 45.5 Heparin Dosing Weight: 65.3 kg  Vital Signs: Temp: 98.4 F (36.9 C) (08/01 0500) BP: 153/60 mmHg (08/01 0500) Pulse Rate: 56 (08/01 0500)  Labs:  Recent Labs  01/21/15 2317  01/22/15 0508  01/22/15 1057 01/22/15 1611 01/23/15 0313 01/24/15 0334  HGB  --   < > 10.2*  --   --   --  10.4* 10.1*  HCT  --   --  34.2*  --   --   --  33.9* 33.4*  PLT  --   --  167  --   --   --  193 184  HEPARINUNFRC  --   --   --   < >  --  0.57 0.68 0.60  TROPONINI 0.36*  --  0.30*  --  0.24*  --   --   --   < > = values in this interval not displayed.  Estimated Creatinine Clearance: 49 mL/min (by C-G formula based on Cr of 0.96).  Assessment: 28 yof continues on IV heparin for ACS. Heparin level = 0.60 remains therapeutic today on IV heparin drip at 900 units/hr. No bleeding noted. Plan for [facility] +/- PCI today with Dr. Claiborne Billings. Continues on ASA 81mg , statin, BB and ACE-I.   Goal of Therapy:  Heparin level 0.3-0.7 units/ml Monitor platelets by anticoagulation protocol: Yes   Plan:  Continue heparin gtt at 900 units/hr Continue daily heparin level and CBC Follow up post cardiac cath +/-PCI today  Thank you for allowing pharmacy to be part of this patients care team.. Nicole Cella, RPh Clinical Pharmacist Pager: [phone]  01/24/2015 10:29 AM

## 2015-01-24 NOTE — Care Management Important Message (Signed)
Important Message  Patient Details  Name: Sharon Compton MRN: [id number] Date of Birth: [birth year]   Medicare Important Message Given:  Yes-second notification given    Pricilla Handler 01/24/2015, 12:32 PM

## 2015-01-24 NOTE — Interval H&P Note (Signed)
Cath Lab Visit (complete for each Cath Lab visit)  Clinical Evaluation Leading to the Procedure:   ACS: Yes.    Non-ACS:    Anginal Classification: CCS III  Anti-ischemic medical therapy: Maximal Therapy (2 or more classes of medications)  Non-Invasive Test Results: No non-invasive testing performed  Prior CABG: No previous CABG      History and Physical Interval Note:  01/24/2015 2:04 PM  Sharon Compton  has presented today for surgery, with the diagnosis of NSTEMI  The various methods of treatment have been discussed with the patient and family. After consideration of risks, benefits and other options for treatment, the patient has consented to  Procedure(s): Left Heart Cath and Coronary Angiography (N/A) as a surgical intervention .  The patient's history has been reviewed, patient examined, no change in status, stable for surgery.  I have reviewed the patient's chart and labs.  Questions were answered to the patient's satisfaction.     KELLY,THOMAS A

## 2015-01-24 NOTE — Progress Notes (Signed)
Site area: RFA Site Prior to Removal:  Level 0 Pressure Applied For:7min Manual: yes   Patient Status During Pull:  stable Post Pull Site:  Level 0 Post Pull Instructions Given: yes  Post Pull Pulses Present: palpable Dressing Applied:  clear Bedrest begins @1600   Comments:

## 2015-01-24 NOTE — Progress Notes (Signed)
Pt post cath, right groin dressing clean,  dry and intact, no bleeding or hematoma noted. Pt reports no pain. Pt educated on bedrest end time. Pt verbalized understanding. Will continue to monitor pt. Rosana Fret RN

## 2015-01-25 ENCOUNTER — Encounter (HOSPITAL_COMMUNITY): Payer: Self-pay | Admitting: Cardiovascular Disease

## 2015-01-25 DIAGNOSIS — I5032 Chronic diastolic (congestive) heart failure: Secondary | ICD-10-CM

## 2015-01-25 DIAGNOSIS — I25700 Atherosclerosis of coronary artery bypass graft(s), unspecified, with unstable angina pectoris: Secondary | ICD-10-CM

## 2015-01-25 LAB — BASIC METABOLIC PANEL
Anion gap: 6 (ref 5–15)
BUN: 10 mg/dL (ref 6–20)
CHLORIDE: 109 mmol/L (ref 101–111)
CO2: 26 mmol/L (ref 22–32)
Calcium: 8.8 mg/dL — ABNORMAL LOW (ref 8.9–10.3)
Creatinine, Ser: 0.97 mg/dL (ref 0.44–1.00)
GFR calc non Af Amer: 56 mL/min — ABNORMAL LOW (ref >60)
Glucose, Bld: 134 mg/dL — ABNORMAL HIGH (ref 65–99)
Potassium: 3.8 mmol/L (ref 3.5–5.1)
Sodium: 141 mmol/L (ref 135–145)

## 2015-01-25 LAB — CBC
HCT: 31.9 % — ABNORMAL LOW (ref 36.0–46.0)
Hemoglobin: 9.6 g/dL — ABNORMAL LOW (ref 12.0–15.0)
MCH: 24.2 pg — AB (ref 26.0–34.0)
MCHC: 30.1 g/dL (ref 30.0–36.0)
MCV: 80.6 fL (ref 78.0–100.0)
PLATELETS: 169 10*3/uL (ref 150–400)
RBC: 3.96 MIL/uL (ref 3.87–5.11)
RDW: 16.9 % — AB (ref 11.5–15.5)
WBC: 4.4 10*3/uL (ref 4.0–10.5)

## 2015-01-25 MED ORDER — AMLODIPINE BESYLATE 5 MG PO TABS: 5.0000 mg | ORAL_TABLET | Freq: Every day | ORAL | Status: DC

## 2015-01-25 MED ORDER — ISOSORBIDE MONONITRATE ER 30 MG PO TB24: 30.0000 mg | ORAL_TABLET | Freq: Every day | ORAL | Status: DC

## 2015-01-25 MED ORDER — METOPROLOL TARTRATE 50 MG PO TABS: 50.0000 mg | ORAL_TABLET | Freq: Two times a day (BID) | ORAL | Status: DC

## 2015-01-25 MED ORDER — ATORVASTATIN CALCIUM 80 MG PO TABS: 80.0000 mg | ORAL_TABLET | Freq: Every day | ORAL | Status: DC

## 2015-01-25 MED ORDER — NITROGLYCERIN 0.4 MG SL SUBL
0.4000 mg | SUBLINGUAL_TABLET | SUBLINGUAL | Status: DC | PRN
Start: 2015-01-25 — End: 2015-04-04

## 2015-01-25 MED FILL — Lidocaine HCl Local Preservative Free (PF) Inj 1%: INTRAMUSCULAR | Qty: 30 | Status: AC

## 2015-01-25 NOTE — Discharge Summary (Signed)
Physician Discharge Summary  Patient ID: Sharon Compton MRN: [id number] DOB/AGE: Aug 23, 1940 74 y.o.   Primary Cardiologist: Dr. Bronson Ing  Admit date: 01/21/2015 Discharge date: 01/25/2015  Admission Diagnoses: Chest Pain  Discharge Diagnoses:  Principal Problem:   Chest pain Active Problems:   Atrial fibrillation   HTN (hypertension), benign   Major depressive disorder, recurrent episode, severe   CAD (coronary artery disease) of artery bypass graft   Chronic diastolic CHF (congestive heart failure)   NSTEMI (non-ST elevated myocardial infarction)   CAD in native artery   Discharged Condition: stable  Hospital Course: 74 y.o.female with history of atrial fibrillation, admission in May 2015 for A. Fib RVR, supple, pressures and was able to only tolerate amiodarone. CHADS VASC Score of 4 but not on anticoagulation due to hx of GIB. Also with h/o hypertension, CAD with bare-metal stent to the right coronary artery in March of 2014, and anxiety being treated by behavioral health, who presented to the emergency room 01/21/15 with recurrent chest pain and elevated troponin. She was placed on IV heparin and admitted to telemetry. Troponin peaked at 0.43. She underwent a LHC by Dr. Claiborne Billings via the right femoral artery. She was found to have moderate nonobstructive CAD (see complete angiographic details below). She did not require PCI. LVF was normal. Medical therapy was selected. She left the cath lab in stable condition. She was continued on ASA, statin, BB and ACE-I therapy. Imdur, 30 mg, was added. She had no recurrent CP. HR and BP remained stable. She had no post cath complications. Renal function remained stable. She ambulated w/o difficulty. She was last seen and examined by Dr. Radford Pax who determined she was stable for discharge home. She will f/u in [place] with Dr. Bronson Ing on 02/08/15.    Consults: None  Significant Diagnostic Studies:  Cardiac Panel (last 3 results) 0.41, 0.43, 0.36,  0.30.    LHC 01/24/15  Mid LAD lesion, 50% stenosed.  Dist RCA lesion, 20% stenosed. The lesion was previously treated with a stent (unknown type) .  RPDA lesion, 70% stenosed.  There is hyperdynamic left ventricular systolic function.  Hyperdynamic LV function with an ejection fraction of greater than 70%.  Mild to moderate coronary obstructive disease with smooth 50% tubular stenosis in the LAD after the takeoff of the proximal second diagonal vessel, which did not significantly improve following IC nitroglycerin administration; normal left circumflex coronary artery; and a large dominant RCA with a patent distal RCA stent in the region of the acute margin with smooth intimal hyperplasia of less than 20% and70-80% smooth ostial narrowing of a small caliber jailed PDA vessel arising from the distal stented segment.  RECOMMENDATION: Medical therapy with further titration of beta blocker therapy, addition of nitrates, and optimal blood pressure control.  Treatments: See Hospital Course  Discharge Exam: Blood pressure 147/71, pulse 82, temperature 98.6 F (37 C), temperature source Oral, resp. rate 16, height 5' (1.524 m), weight 181 lb 4.8 oz (82.237 kg), SpO2 91 %.   Disposition: 01-Home or Self Care      Discharge Instructions    Diet - low sodium heart healthy    Complete by:  As directed      Increase activity slowly    Complete by:  As directed             Medication List    STOP taking these medications        FERREX 150 150 MG capsule  Generic drug:  iron polysaccharides  gabapentin 400 MG capsule  Commonly known as:  NEURONTIN     hydrocortisone 25 MG suppository  Commonly known as:  ANUSOL-HC     pravastatin 80 MG tablet  Commonly known as:  PRAVACHOL      TAKE these medications        ALPRAZolam 1 MG tablet  Commonly known as:  XANAX  Take 1 tablet (1 mg total) by mouth 4 (four) times daily.     amLODipine 5 MG tablet  Commonly known as:   NORVASC  Take 1 tablet (5 mg total) by mouth daily.     aspirin EC 81 MG tablet  Take 1 tablet (81 mg total) by mouth daily. HOLD ASPIRIN FOR 1 WEEK GIVEN BLEEDING; THEN OK TO RESUME     atorvastatin 80 MG tablet  Commonly known as:  LIPITOR  Take 1 tablet (80 mg total) by mouth daily at 6 PM.     escitalopram 10 MG tablet  Commonly known as:  LEXAPRO  Take 1 tablet (10 mg total) by mouth daily.     ESTROVEN MENOPAUSE RELIEF PO  Take 1 tablet by mouth daily.     isosorbide mononitrate 30 MG 24 hr tablet  Commonly known as:  IMDUR  Take 1 tablet (30 mg total) by mouth daily.     lisinopril 40 MG tablet  Commonly known as:  PRINIVIL,ZESTRIL  Take 1 tablet (40 mg total) by mouth daily.     metoprolol 50 MG tablet  Commonly known as:  LOPRESSOR  Take 1 tablet (50 mg total) by mouth 2 (two) times daily.     nitroGLYCERIN 0.4 MG SL tablet  Commonly known as:  NITROSTAT  Place 1 tablet (0.4 mg total) under the tongue every 5 (five) minutes as needed for chest pain.     pantoprazole 40 MG tablet  Commonly known as:  PROTONIX  Take 1 tablet (40 mg total) by mouth daily.     polyethylene glycol packet  Commonly known as:  MIRALAX / GLYCOLAX  Take 17 g by mouth daily.     SYNTHROID 50 MCG tablet  Generic drug:  levothyroxine  1 po every other day     torsemide 20 MG tablet  Commonly known as:  DEMADEX  Take 20 mg by mouth daily as needed (fluid).       Follow-up Information    Follow up with Herminio Commons, MD On 02/10/2015.   Specialty:  Cardiology   Why:  8:40 am (cardiology follow-up)   Contact information:   [place] [id number] [phone]      TIME SPENT ON DISCHARGE INCLUDING PHYSICIAN TIME: >30 MINUTES  Signed: Lyda Jester 01/25/2015, 10:20 AM

## 2015-01-25 NOTE — Progress Notes (Signed)
Utilization review completed.  

## 2015-01-25 NOTE — Care Management Note (Signed)
Case Management Note CM note started by Whitman Hero RNCM   Patient Details  Name: Sharon Compton MRN: [id number] Date of Birth: 09-23-1940  Subjective/Objective:                  Admitted with CP, NSTEMI   Action/Plan:  Return to home when medically stable. CM to f/u with d/c needs.  Expected Discharge Date:       01/25/15           Expected Discharge Plan:  Home/Self Care  In-House Referral:  Clinical Social Work (pt states will need transportation to home @ d/c)CSW  Discharge planning Services  CM Consult  Post Acute Care Choice:   NA Choice offered to:     DME Arranged:   NA DME Agency:     HH Arranged:   NA HH Agency:     Status of Service:  Completed, signed off  Medicare Important Message Given:  Yes-second notification given Date Medicare IM Given:   01/24/15 Medicare IM give by:    Date Additional Medicare IM Given:    Additional Medicare Important Message give by:     If discussed at [place] of Stay Meetings, dates discussed:    Additional Comments:  Cardiac cath. planned for 01/24/2015 . Pt states will need help with transportation to home @ d/c, husband/family will not be able to assist. CSW referral placed by CM.  Tashiana Lamarca (Spouse)  [phone]  Dahlia Client Romeo Rabon, RN 01/25/2015, 11:00 AM

## 2015-01-25 NOTE — Progress Notes (Signed)
Patient Profile: Sharon Compton is a 74 yo female with PMH of CAD(bare metal stent RCA, 2014), HTN, and A Fib with RVR and not on anticoagulation due to hx of GI bleed who was admitted on 01/21/15 for chest pain and found to have an NSTEMI.  Subjective: Feels great. No recurrent cp. Ambulating w/o difficulty.   Objective: Vital signs in last 24 hours: Temp:  [98.3 F (36.8 C)-98.6 F (37 C)] 98.6 F (37 C) (08/02 0750) Pulse Rate:  [0-82] 82 (08/02 0750) Resp:  [0-30] 16 (08/02 0750) BP: (118-194)/(53-92) 147/71 mmHg (08/02 0750) SpO2:  [0 %-100 %] 91 % (08/02 0750) Weight:  [181 lb 4.8 oz (82.237 kg)] 181 lb 4.8 oz (82.237 kg) (08/02 0400) Last BM Date: 01/24/15  Intake/Output from previous day: 08/01 0701 - 08/02 0700 In: 260 [P.O.:260] Out: -  Intake/Output this shift:    Medications Current Facility-Administered Medications  Medication Dose Route Frequency Provider Last Rate Last Dose  . 0.9 %  sodium chloride infusion  250 mL Intravenous PRN Troy Sine, MD      . acetaminophen (TYLENOL) tablet 650 mg  650 mg Oral Q4H PRN Erline Hau, MD      . ALPRAZolam Duanne Moron) tablet 0.5 mg  0.5 mg Oral TID PRN Oswald Hillock, MD   0.5 mg at 01/24/15 0539  . ALPRAZolam Duanne Moron) tablet 1 mg  1 mg Oral QID Erline Hau, MD   1 mg at 01/25/15 0806  . amLODipine (NORVASC) tablet 5 mg  5 mg Oral Daily Sueanne Margarita, MD   5 mg at 01/25/15 0807  . aspirin EC tablet 81 mg  81 mg Oral Daily Erline Hau, MD   81 mg at 01/25/15 5597  . atorvastatin (LIPITOR) tablet 80 mg  80 mg Oral q1800 Sueanne Margarita, MD      . escitalopram (LEXAPRO) tablet 10 mg  10 mg Oral Daily Erline Hau, MD   10 mg at 01/23/15 0810  . gabapentin (NEURONTIN) capsule 400 mg  400 mg Oral BID Erline Hau, MD   400 mg at 01/25/15 4163  . heparin injection 5,000 Units  5,000 Units Subcutaneous 3 times per day Troy Sine, MD   5,000 Units at 01/25/15  0514  . hydrALAZINE (APRESOLINE) tablet 25 mg  25 mg Oral Q6H PRN Oswald Hillock, MD   25 mg at 01/22/15 1727  . iron polysaccharides (NIFEREX) capsule 150 mg  150 mg Oral Daily Erline Hau, MD   150 mg at 01/25/15 8453  . isosorbide mononitrate (IMDUR) 24 hr tablet 30 mg  30 mg Oral Daily Troy Sine, MD   30 mg at 01/25/15 0807  . levothyroxine (SYNTHROID, LEVOTHROID) tablet 50 mcg  50 mcg Oral QODAY Erline Hau, MD   50 mcg at 01/25/15 [phone]  . lisinopril (PRINIVIL,ZESTRIL) tablet 40 mg  40 mg Oral Daily Erline Hau, MD   40 mg at 01/25/15 0321  . metoprolol (LOPRESSOR) tablet 50 mg  50 mg Oral BID Troy Sine, MD   50 mg at 01/25/15 2248  . ondansetron (ZOFRAN) injection 4 mg  4 mg Intravenous Q6H PRN Erline Hau, MD      . pantoprazole (PROTONIX) EC tablet 40 mg  40 mg Oral Daily Erline Hau, MD   40 mg at 01/25/15 0806  . polyethylene glycol (MIRALAX /  GLYCOLAX) packet 17 g  17 g Oral Daily Erline Hau, MD   17 g at 01/21/15 1223  . sodium chloride 0.9 % injection 3 mL  3 mL Intravenous Q12H Troy Sine, MD   3 mL at 01/25/15 0811  . sodium chloride 0.9 % injection 3 mL  3 mL Intravenous PRN Troy Sine, MD      . torsemide [facility]) tablet 20 mg  20 mg Oral Daily Estela Leonie Green, MD   20 mg at 01/21/15 1223  . zolpidem (AMBIEN) tablet 5 mg  5 mg Oral QHS PRN Dianne Dun, NP   5 mg at 01/22/15 0136    PE: General appearance: alert, cooperative and no distress Neck: no JVD Lungs: clear to auscultation bilaterally Heart: regular rate and rhythm and 2/3 SM at LUSB Extremities: no LEE Pulses: 2+ and symmetric Skin: warm and dry Neurologic: Grossly normal  Lab Results:   Recent Labs  01/23/15 0313 01/24/15 0334 01/25/15 0432  WBC 5.7 6.5 4.4  HGB 10.4* 10.1* 9.6*  HCT 33.9* 33.4* 31.9*  PLT 193 184 169   BMET  Recent Labs  01/25/15 0432  NA 141  K 3.8  CL  109  CO2 26  GLUCOSE 134*  BUN 10  CREATININE 0.97  CALCIUM 8.8*   PT/INR No results for input(s): LABPROT, INR in the last 72 hours. Cholesterol No results for input(s): CHOL in the last 72 hours. Cardiac Enzymes Invalid input(s): TROPONIN,  CKMB  Studies/Results: Cardiac Catheterization - 01/24/2015  Mid LAD lesion, 50% stenosed.  Dist RCA lesion, 20% stenosed. The lesion was previously treated with a stent (unknown type) .  RPDA lesion, 70% stenosed.  There is hyperdynamic left ventricular systolic function.  Hyperdynamic LV function with an ejection fraction of greater than 70%.  Mild to moderate coronary obstructive disease with smooth 50% tubular stenosis in the LAD after the takeoff of the proximal second diagonal vessel, which did not significantly improve following IC nitroglycerin administration; normal left circumflex coronary artery; and a large dominant RCA with a patent distal RCA stent in the region of the acute margin with smooth intimal hyperplasia of less than 20% and70-80% smooth ostial narrowing of a small caliber jailed PDA vessel arising from the distal stented segment.  RECOMMENDATION: Medical therapy with further titration of beta blocker therapy, addition of nitrates, and optimal blood pressure control.  Assessment/Plan Principal Problem:   Chest pain Active Problems:   Atrial fibrillation   HTN (hypertension), benign   Major depressive disorder, recurrent episode, severe   CAD (coronary artery disease) of artery bypass graft   Chronic diastolic CHF (congestive heart failure)   NSTEMI (non-ST elevated myocardial infarction)   CAD in native artery  1. NSTEMI: elevated troponins x 3. Moderate nonobstructive CAD noted on cath. No PCI. Plan for medical therapy. She is CP free. Groin is stable. ASA, BB, statin, Imdur, amlodipine.   2. PAF: NSR on tele. Continue BB.  No a/c given h/o GIB.   3. HTN: slightly elevated. Continue current regimen. Follow  as an OP.     LOS: 3 days    Jamia Hoban M. Rosita Fire, PA-C 01/25/2015 8:12 AM

## 2015-01-25 NOTE — Progress Notes (Signed)
CSW (Clinical Education officer, museum) notified pt needing assistance with transportation home. Per pt she usually uses [facility] to help with transport for her and her husband. Pt husband provided CSW with phone number. CSW has placed call requesting transport and awaiting return phone call to see if transport can be scheduled for today.  [place], [place]

## 2015-01-26 ENCOUNTER — Telehealth: Payer: Self-pay | Admitting: Family Medicine

## 2015-01-26 NOTE — Telephone Encounter (Signed)
Extensively reviewed medications with patient.  Made follow up appointment with Dr. Warrick Parisian 01/31/15 at 3:55.  Informed patient to bring all medications she is currently taking along with discharge summary from hospital.  Patient verbalized understanding.

## 2015-01-31 ENCOUNTER — Ambulatory Visit (INDEPENDENT_AMBULATORY_CARE_PROVIDER_SITE_OTHER): Payer: Medicare Other | Admitting: Family Medicine

## 2015-01-31 ENCOUNTER — Encounter: Payer: Self-pay | Admitting: Family Medicine

## 2015-01-31 VITALS — BP 151/76 | HR 63 | Temp 97.5°F | Ht 60.0 in | Wt 186.0 lb

## 2015-01-31 DIAGNOSIS — F411 Generalized anxiety disorder: Secondary | ICD-10-CM | POA: Diagnosis not present

## 2015-01-31 DIAGNOSIS — I2510 Atherosclerotic heart disease of native coronary artery without angina pectoris: Secondary | ICD-10-CM | POA: Diagnosis not present

## 2015-01-31 DIAGNOSIS — K219 Gastro-esophageal reflux disease without esophagitis: Secondary | ICD-10-CM

## 2015-01-31 MED ORDER — POLYSACCHARIDE IRON COMPLEX 150 MG PO CAPS: 150.0000 mg | ORAL_CAPSULE | Freq: Every day | ORAL | Status: DC

## 2015-01-31 MED ORDER — PANTOPRAZOLE SODIUM 40 MG PO TBEC: 40.0000 mg | DELAYED_RELEASE_TABLET | Freq: Every day | ORAL | Status: DC

## 2015-01-31 MED ORDER — ROSUVASTATIN CALCIUM 40 MG PO TABS: 40.0000 mg | ORAL_TABLET | Freq: Every day | ORAL | Status: DC
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
## 2015-01-31 NOTE — Assessment & Plan Note (Signed)
Discussed with patient the risks of such high doses of benzos, patient refused to go on a half dose, patient became angry and said she was going to find another provider, she left without prescription for half dose. She said she was going to go back to her previous provider. Also recommended counseling which patient refused. Patient said she was leaving practice.

## 2015-02-01 ENCOUNTER — Telehealth: Payer: Self-pay | Admitting: Family Medicine

## 2015-02-01 NOTE — Telephone Encounter (Signed)
Pt was seen on Monday by Dr. Warrick Parisian. TC from husband stating that she told him she was taken off her xanax completely and that she can not do this she has been on them for a long time and if she does not take them she gets stressed and ends up in Afib

## 2015-02-04 ENCOUNTER — Encounter (INDEPENDENT_AMBULATORY_CARE_PROVIDER_SITE_OTHER): Payer: Self-pay

## 2015-02-04 ENCOUNTER — Ambulatory Visit (INDEPENDENT_AMBULATORY_CARE_PROVIDER_SITE_OTHER): Payer: Medicare Other | Admitting: Family Medicine

## 2015-02-04 ENCOUNTER — Encounter: Payer: Self-pay | Admitting: Family Medicine

## 2015-02-04 VITALS — BP 122/73 | HR 68 | Temp 97.7°F | Ht 60.0 in | Wt 182.0 lb

## 2015-02-04 DIAGNOSIS — I480 Paroxysmal atrial fibrillation: Secondary | ICD-10-CM

## 2015-02-04 DIAGNOSIS — I10 Essential (primary) hypertension: Secondary | ICD-10-CM

## 2015-02-04 DIAGNOSIS — F411 Generalized anxiety disorder: Secondary | ICD-10-CM | POA: Diagnosis not present

## 2015-02-04 DIAGNOSIS — F418 Other specified anxiety disorders: Secondary | ICD-10-CM

## 2015-02-04 DIAGNOSIS — G4700 Insomnia, unspecified: Secondary | ICD-10-CM | POA: Diagnosis not present

## 2015-02-04 DIAGNOSIS — I2510 Atherosclerotic heart disease of native coronary artery without angina pectoris: Secondary | ICD-10-CM | POA: Diagnosis not present

## 2015-02-04 DIAGNOSIS — D509 Iron deficiency anemia, unspecified: Secondary | ICD-10-CM | POA: Diagnosis not present

## 2015-02-04 DIAGNOSIS — F4310 Post-traumatic stress disorder, unspecified: Secondary | ICD-10-CM | POA: Diagnosis not present

## 2015-02-04 MED ORDER — DULOXETINE HCL 30 MG PO CPEP: 30.0000 mg | ORAL_CAPSULE | Freq: Every day | ORAL | Status: DC

## 2015-02-04 MED ORDER — ALPRAZOLAM 1 MG PO TABS: 1.0000 mg | ORAL_TABLET | Freq: Four times a day (QID) | ORAL | Status: DC
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
## 2015-02-10 ENCOUNTER — Encounter: Payer: Medicare Other | Admitting: Cardiovascular Disease

## 2015-02-11 ENCOUNTER — Encounter: Payer: Medicare Other | Admitting: Cardiovascular Disease

## 2015-02-15 ENCOUNTER — Ambulatory Visit: Payer: Medicare Other | Admitting: Family Medicine

## 2015-02-25 ENCOUNTER — Ambulatory Visit: Payer: Medicare Other | Admitting: Family Medicine

## 2015-03-04 ENCOUNTER — Ambulatory Visit (INDEPENDENT_AMBULATORY_CARE_PROVIDER_SITE_OTHER): Payer: Medicare Other | Admitting: Family

## 2015-03-04 ENCOUNTER — Encounter: Payer: Self-pay | Admitting: Family

## 2015-03-04 VITALS — BP 124/89 | HR 53 | Temp 97.4°F | Ht 60.0 in | Wt 188.0 lb

## 2015-03-04 DIAGNOSIS — I2510 Atherosclerotic heart disease of native coronary artery without angina pectoris: Secondary | ICD-10-CM

## 2015-03-04 DIAGNOSIS — I10 Essential (primary) hypertension: Secondary | ICD-10-CM | POA: Diagnosis not present

## 2015-03-04 DIAGNOSIS — I25700 Atherosclerosis of coronary artery bypass graft(s), unspecified, with unstable angina pectoris: Secondary | ICD-10-CM

## 2015-03-04 DIAGNOSIS — R001 Bradycardia, unspecified: Secondary | ICD-10-CM

## 2015-03-04 DIAGNOSIS — I5033 Acute on chronic diastolic (congestive) heart failure: Secondary | ICD-10-CM | POA: Diagnosis not present

## 2015-03-04 DIAGNOSIS — I4891 Unspecified atrial fibrillation: Secondary | ICD-10-CM

## 2015-03-04 MED ORDER — METOPROLOL TARTRATE 50 MG PO TABS: 50.0000 mg | ORAL_TABLET | Freq: Two times a day (BID) | ORAL | Status: DC

## 2015-03-04 MED ORDER — METOPROLOL SUCCINATE ER 25 MG PO TB24: 25.0000 mg | ORAL_TABLET | Freq: Every day | ORAL | Status: DC

## 2015-03-04 NOTE — Patient Instructions (Addendum)
Heart Failure Heart failure is a condition in which the heart has trouble pumping blood. This means your heart does not pump blood efficiently for your body to work well. In some cases of heart failure, fluid may back up into your lungs or you may have swelling (edema) in your lower legs. Heart failure is usually a long-term (chronic) condition. It is important for you to take good care of yourself and follow your health care provider's treatment plan. CAUSES  Some health conditions can cause heart failure. Those health conditions include:  High blood pressure (hypertension). Hypertension causes the heart muscle to work harder than normal. When pressure in the blood vessels is high, the heart needs to pump (contract) with more force in order to circulate blood throughout the body. High blood pressure eventually causes the heart to become stiff and weak.  Coronary artery disease (CAD). CAD is the buildup of cholesterol and fat (plaque) in the arteries of the heart. The blockage in the arteries deprives the heart muscle of oxygen and blood. This can cause chest pain and may lead to a heart attack. High blood pressure can also contribute to CAD.  Heart attack (myocardial infarction). A heart attack occurs when one or more arteries in the heart become blocked. The loss of oxygen damages the muscle tissue of the heart. When this happens, part of the heart muscle dies. The injured tissue does not contract as well and weakens the heart's ability to pump blood.  Abnormal heart valves. When the heart valves do not open and close properly, it can cause heart failure. This makes the heart muscle pump harder to keep the blood flowing.  Heart muscle disease (cardiomyopathy or myocarditis). Heart muscle disease is damage to the heart muscle from a variety of causes. These can include drug or alcohol abuse, infections, or unknown reasons. These can increase the risk of heart failure.  Lung disease. Lung disease  makes the heart work harder because the lungs do not work properly. This can cause a strain on the heart, leading it to fail.  Diabetes. Diabetes increases the risk of heart failure. High blood sugar contributes to high fat (lipid) levels in the blood. Diabetes can also cause slow damage to tiny blood vessels that carry important nutrients to the heart muscle. When the heart does not get enough oxygen and food, it can cause the heart to become weak and stiff. This leads to a heart that does not contract efficiently.  Other conditions can contribute to heart failure. These include abnormal heart rhythms, thyroid problems, and low blood counts (anemia). Certain unhealthy behaviors can increase the risk of heart failure, including:  Being overweight.  Smoking or chewing tobacco.  Eating foods high in fat and cholesterol.  Abusing illicit drugs or alcohol.  Lacking physical activity. SYMPTOMS  Heart failure symptoms may vary and can be hard to detect. Symptoms may include:  Shortness of breath with activity, such as climbing stairs.  Persistent cough.  Swelling of the feet, ankles, legs, or abdomen.  Unexplained weight gain.  Difficulty breathing when lying flat (orthopnea).  Waking from sleep because of the need to sit up and get more air.  Rapid heartbeat.  Fatigue and loss of energy.  Feeling light-headed, dizzy, or close to fainting.  Loss of appetite.  Nausea.  Increased urination during the night (nocturia). DIAGNOSIS  A diagnosis of heart failure is based on your history, symptoms, physical examination, and diagnostic tests. Diagnostic tests for heart failure may include:  Echocardiography. °· Electrocardiography. °· Chest X-ray. °· Blood tests. °· Exercise stress test. °· Cardiac angiography. °· Radionuclide scans. °TREATMENT  °Treatment is aimed at managing the symptoms of heart failure. Medicines, behavioral changes, or surgical intervention may be necessary to  treat heart failure. °· Medicines to help treat heart failure may include: °¨ Angiotensin-converting enzyme (ACE) inhibitors. This type of medicine blocks the effects of a blood protein called angiotensin-converting enzyme. ACE inhibitors relax (dilate) the blood vessels and help lower blood pressure. °¨ Angiotensin receptor blockers (ARBs). This type of medicine blocks the actions of a blood protein called angiotensin. Angiotensin receptor blockers dilate the blood vessels and help lower blood pressure. °¨ Water pills (diuretics). Diuretics cause the kidneys to remove salt and water from the blood. The extra fluid is removed through urination. This loss of extra fluid lowers the volume of blood the heart pumps. °¨ Beta blockers. These prevent the heart from beating too fast and improve heart muscle strength. °¨ Digitalis. This increases the force of the heartbeat. °· Healthy behavior changes include: °¨ Obtaining and maintaining a healthy weight. °¨ Stopping smoking or chewing tobacco. °¨ Eating heart-healthy foods. °¨ Limiting or avoiding alcohol. °¨ Stopping illicit drug use. °¨ Physical activity as directed by your health care provider. °· Surgical treatment for heart failure may include: °¨ A procedure to open blocked arteries, repair damaged heart valves, or remove damaged heart muscle tissue. °¨ A pacemaker to improve heart muscle function and control certain abnormal heart rhythms. °¨ An internal cardioverter defibrillator to treat certain serious abnormal heart rhythms. °¨ A left ventricular assist device (LVAD) to assist the pumping ability of the heart. °HOME CARE INSTRUCTIONS  °· Take medicines only as directed by your health care provider. Medicines are important in reducing the workload of your heart, slowing the progression of heart failure, and improving your symptoms. °¨ Do not stop taking your medicine unless directed by your health care provider. °¨ Do not skip any dose of medicine. °¨ Refill your  prescriptions before you run out of medicine. Your medicines are needed every day. °· Engage in moderate physical activity if directed by your health care provider. Moderate physical activity can benefit some people. The elderly and people with severe heart failure should consult with a health care provider for physical activity recommendations. °· Eat heart-healthy foods. Food choices should be free of trans fat and low in saturated fat, cholesterol, and salt (sodium). Healthy choices include fresh or frozen fruits and vegetables, fish, lean meats, legumes, fat-free or low-fat dairy products, and whole grain or high fiber foods. Talk to a dietitian to learn more about heart-healthy foods. °· Limit sodium if directed by your health care provider. Sodium restriction may reduce symptoms of heart failure in some people. Talk to a dietitian to learn more about heart-healthy seasonings. °· Use healthy cooking methods. Healthy cooking methods include roasting, grilling, broiling, baking, poaching, steaming, or stir-frying. Talk to a dietitian to learn more about healthy cooking methods. °· Limit fluids if directed by your health care provider. Fluid restriction may reduce symptoms of heart failure in some people. °· Weigh yourself every day. Daily weights are important in the early recognition of excess fluid. You should weigh yourself every morning after you urinate and before you eat breakfast. Wear the same amount of clothing each time you weigh yourself. Record your daily weight. Provide your health care provider with your weight record. °· Monitor and record your blood pressure if directed by your health care   provider. °· Check your pulse if directed by your health care provider. °· Lose weight if directed by your health care provider. Weight loss may reduce symptoms of heart failure in some people. °· Stop smoking or chewing tobacco. Nicotine makes your heart work harder by causing your blood vessels to constrict.  Do not use nicotine gum or patches before talking to your health care provider. °· Keep all follow-up visits as directed by your health care provider. This is important. °· Limit alcohol intake to no more than 1 drink per day for nonpregnant women and 2 drinks per day for men. One drink equals 12 ounces of beer, 5 ounces of wine, or 1½ ounces of hard liquor. Drinking more than that is harmful to your heart. Tell your health care provider if you drink alcohol several times a week. Talk with your health care provider about whether alcohol is safe for you. If your heart has already been damaged by alcohol or you have severe heart failure, drinking alcohol should be stopped completely. °· Stop illicit drug use. °· Stay up-to-date with immunizations. It is especially important to prevent respiratory infections through current pneumococcal and influenza immunizations. °· Manage other health conditions such as hypertension, diabetes, thyroid disease, or abnormal heart rhythms as directed by your health care provider. °· Learn to manage stress. °· Plan rest periods when fatigued. °· Learn strategies to manage high temperatures. If the weather is extremely hot: °¨ Avoid vigorous physical activity. °¨ Use air conditioning or fans or seek a cooler location. °¨ Avoid caffeine and alcohol. °¨ Wear loose-fitting, lightweight, and light-colored clothing. °· Learn strategies to manage cold temperatures. If the weather is extremely cold: °¨ Avoid vigorous physical activity. °¨ Layer clothes. °¨ Wear mittens or gloves, a hat, and a scarf when going outside. °¨ Avoid alcohol. °· Obtain ongoing education and support as needed. °· Participate in or seek rehabilitation as needed to maintain or improve independence and quality of life. °SEEK MEDICAL CARE IF:  °· Your weight increases by 03 lb/1.4 kg in 1 day or 05 lb/2.3 kg in a week. °· You have increasing shortness of breath that is unusual for you. °· You are unable to participate in  your usual physical activities. °· You tire easily. °· You cough more than normal, especially with physical activity. °· You have any or more swelling in areas such as your hands, feet, ankles, or abdomen. °· You are unable to sleep because it is hard to breathe. °· You feel like your heart is beating fast (palpitations). °· You become dizzy or light-headed upon standing up. °SEEK IMMEDIATE MEDICAL CARE IF:  °· You have difficulty breathing. °· There is a change in mental status such as decreased alertness or difficulty with concentration. °· You have a pain or discomfort in your chest. °· You have an episode of fainting (syncope). °MAKE SURE YOU:  °· Understand these instructions. °· Will watch your condition. °· Will get help right away if you are not doing well or get worse. °Document Released: 06/11/2005 Document Revised: 10/26/2013 Document Reviewed: 07/11/2012 °[employer]® Patient Information ©2015 [employer]. This information is not intended to replace advice given to you by your health care provider. Make sure you discuss any questions you have with your health care provider. °Atrial Fibrillation °Atrial fibrillation is a type of irregular heart rhythm (arrhythmia). During atrial fibrillation, the upper chambers of the heart (atria) quiver continuously in a chaotic pattern. This causes an irregular and often rapid heart rate.  °  Atrial fibrillation is the result of the heart becoming overloaded with disorganized signals that tell it to beat. These signals are normally released one at a time by a part of the right atrium called the sinoatrial node. They then travel from the atria to the lower chambers of the heart (ventricles), causing the atria and ventricles to contract and pump blood as they pass. In atrial fibrillation, parts of the atria outside of the sinoatrial node also release these signals. This results in two problems. First, the atria receive so many signals that they do not have time to fully  contract. Second, the ventricles, which can only receive one signal at a time, beat irregularly and out of rhythm with the atria.  °There are three types of atrial fibrillation:  °· Paroxysmal. Paroxysmal atrial fibrillation starts suddenly and stops on its own within a week. °· Persistent. Persistent atrial fibrillation lasts for more than a week. It may stop on its own or with treatment. °· Permanent. Permanent atrial fibrillation does not go away. Episodes of atrial fibrillation may lead to permanent atrial fibrillation. °Atrial fibrillation can prevent your heart from pumping blood normally. It increases your risk of stroke and can lead to heart failure.  °CAUSES  °· Heart conditions, including a heart attack, heart failure, coronary artery disease, and heart valve conditions.   °· Inflammation of the sac that surrounds the heart (pericarditis). °· Blockage of an artery in the lungs (pulmonary embolism). °· Pneumonia or other infections. °· Chronic lung disease. °· Thyroid problems, especially if the thyroid is overactive (hyperthyroidism). °· Caffeine, excessive alcohol use, and use of some illegal drugs.   °· Use of some medicines, including certain decongestants and diet pills. °· Heart surgery.   °· Birth defects.   °Sometimes, no cause can be found. When this happens, the atrial fibrillation is called lone atrial fibrillation. The risk of complications from atrial fibrillation increases if you have lone atrial fibrillation and you are age 60 years or older. °RISK FACTORS °· Heart failure. °· Coronary artery disease. °· Diabetes mellitus.   °· High blood pressure (hypertension).   °· Obesity.   °· Other arrhythmias.   °· Increased age. °SIGNS AND SYMPTOMS  °· A feeling that your heart is beating rapidly or irregularly.   °· A feeling of discomfort or pain in your chest.   °· Shortness of breath.   °· Sudden light-headedness or weakness.   °· Getting tired easily when exercising.   °· Urinating more often  than normal (mainly when atrial fibrillation first begins).   °In paroxysmal atrial fibrillation, symptoms may start and suddenly stop. °DIAGNOSIS  °Your health care provider may be able to detect atrial fibrillation when taking your pulse. Your health care provider may have you take a test called an ambulatory electrocardiogram (ECG). An ECG records your heartbeat patterns over a 24-hour period. You may also have other tests, such as: °· Transthoracic echocardiogram (TTE). During echocardiography, sound waves are used to evaluate how blood flows through your heart. °· Transesophageal echocardiogram (TEE). °· Stress test. There is more than one type of stress test. If a stress test is needed, ask your health care provider about which type is best for you. °· Chest X-ray exam. °· Blood tests. °· Computed tomography (CT). °TREATMENT  °Treatment may include: °· Treating any underlying conditions. For example, if you have an overactive thyroid, treating the condition may correct atrial fibrillation. °· Taking medicine. Medicines may be given to control a rapid heart rate or to prevent blood clots, heart failure, or a stroke. °· Having a   procedure to correct the rhythm of the heart: °¨ Electrical cardioversion. During electrical cardioversion, a controlled, low-energy shock is delivered to the heart through your skin. If you have chest pain, very low blood pressure, or sudden heart failure, this procedure may need to be done as an emergency. °¨ Catheter ablation. During this procedure, heart tissues that send the signals that cause atrial fibrillation are destroyed. °¨ Surgical ablation. During this surgery, thin lines of heart tissue that carry the abnormal signals are destroyed. This procedure can either be an open-heart surgery or a minimally invasive surgery. With the minimally invasive surgery, small cuts are made to access the heart instead of a large opening. °¨ Pulmonary venous isolation. During this surgery,  tissue around the veins that carry blood from the lungs (pulmonary veins) is destroyed. This tissue is thought to carry the abnormal signals. °HOME CARE INSTRUCTIONS  °· Take medicines only as directed by your health care provider. Some medicines can make atrial fibrillation worse or recur. °· If blood thinners were prescribed by your health care provider, take them exactly as directed. Too much blood-thinning medicine can cause bleeding. If you take too little, you will not have the needed protection against stroke and other problems. °· Perform blood tests at home if directed by your health care provider. Perform blood tests exactly as directed. °· Quit smoking if you smoke. °· Do not drink alcohol. °· Do not drink caffeinated beverages such as coffee, soda, and some teas. You may drink decaffeinated coffee, soda, or tea.   °· Maintain a healthy weight. Do not use diet pills unless your health care provider approves. They may make heart problems worse.   °· Follow diet instructions as directed by your health care provider. °· Exercise regularly as directed by your health care provider. °· Keep all follow-up visits as directed by your health care provider. This is important. °PREVENTION  °The following substances can cause atrial fibrillation to recur:  °· Caffeinated beverages. °· Alcohol. °· Certain medicines, especially those used for breathing problems. °· Certain herbs and herbal medicines, such as those containing ephedra or ginseng. °· Illegal drugs, such as cocaine and amphetamines. °Sometimes medicines are given to prevent atrial fibrillation from recurring. Proper treatment of any underlying condition is also important in helping prevent recurrence.  °SEEK MEDICAL CARE IF: °· You notice a change in the rate, rhythm, or strength of your heartbeat. °· You suddenly begin urinating more frequently. °· You tire more easily when exerting yourself or exercising. °SEEK IMMEDIATE MEDICAL CARE IF:  °· You have  chest pain, abdominal pain, sweating, or weakness. °· You feel nauseous. °· You have shortness of breath. °· You suddenly have swollen feet and ankles. °· You feel dizzy. °· Your face or limbs feel numb or weak. °· You have a change in your vision or speech. °MAKE SURE YOU:  °· Understand these instructions. °· Will watch your condition. °· Will get help right away if you are not doing well or get worse. °Document Released: 06/11/2005 Document Revised: 10/26/2013 Document Reviewed: 07/22/2012 °[employer]® Patient Information ©2015 [employer]. This information is not intended to replace advice given to you by your health care provider. Make sure you discuss any questions you have with your health care provider. ° °

## 2015-03-04 NOTE — Progress Notes (Signed)
   Subjective:    Patient ID: Sharon Compton, female    DOB: 1941/05/21, 74 y.o.   MRN: [id number]  Pt presents to the office today for SOB. Pt states she has had history of CHF over the last 5 years. PT  Breathing Problem She complains of difficulty breathing, shortness of breath and wheezing. There is no cough. This is a chronic problem. The current episode started more than 1 year ago. The problem occurs constantly. The problem has been waxing and waning. Associated symptoms include dyspnea on exertion. Pertinent negatives include no headaches.      Review of Systems  Constitutional: Negative.   HENT: Negative.   Eyes: Negative.   Respiratory: Positive for shortness of breath and wheezing. Negative for cough.   Cardiovascular: Positive for dyspnea on exertion. Negative for palpitations.  Gastrointestinal: Negative.   Endocrine: Negative.   Genitourinary: Negative.   Musculoskeletal: Negative.   Neurological: Negative.  Negative for headaches.  Hematological: Negative.   Psychiatric/Behavioral: Negative.   All other systems reviewed and are negative.      Objective:   Physical Exam  Constitutional: She is oriented to person, place, and time. She appears well-developed and well-nourished. No distress.  HENT:  Head: Normocephalic and atraumatic.  Eyes: Pupils are equal, round, and reactive to light.  Neck: Normal range of motion. Neck supple. No thyromegaly present.  Cardiovascular: Normal rate, normal heart sounds and intact distal pulses.   No murmur heard. Pulmonary/Chest: Effort normal and breath sounds normal. No respiratory distress. She has no wheezes.  Abdominal: Soft. Bowel sounds are normal. She exhibits no distension. There is no tenderness.  Musculoskeletal: Normal range of motion. She exhibits no edema or tenderness.  Neurological: She is alert and oriented to person, place, and time. She has normal reflexes. No cranial nerve deficit.  Skin: Skin is warm and dry.    Psychiatric: She has a normal mood and affect. Her behavior is normal. Judgment and thought content normal.  Vitals reviewed.   EKG- A FIb , abnormal EKG Preliminary reading by Evelina Dun, FNP [facility]   BP 124/89 mmHg  Pulse 53  Temp(Src) 97.4 F (36.3 C) (Oral)  Ht 5' (1.524 m)  Wt 188 lb (85.276 kg)  BMI 36.72 kg/m2  SpO2 95%     Assessment & Plan:  1. Acute on chronic diastolic CHF (congestive heart failure), NYHA class 3 - CMP14+EGFR - EKG 12-Lead - Ambulatory referral to Cardiology  2. Coronary artery disease involving native coronary artery of native heart without angina pectoris - CMP14+EGFR - EKG 12-Lead - Ambulatory referral to Cardiology  3. 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 Essential hypertension - CMP14+EGFR - EKG 12-Lead - Ambulatory referral to Cardiology  5. Bradycardia - CMP14+EGFR - EKG 12-Lead - Ambulatory referral to Cardiology  6. Atrial fibrillation, unspecifiec - Ambulatory referral to Cardiology    Importance of weighing daily discussed Weight gain of >2 lbs in one day or >5 lbs in one week Pt discussed EKG and told to got to ED because of her SOB, new a fib, abnormal EKG and her Cardiologists appt is a new appt until next month- PT states she does not want to go to ED until Monday PT told if symptoms increase to go to ED Pt to keep follow up with Cardiologists RTO in 1 week  Evelina Dun, FNP

## 2015-03-05 LAB — CMP14+EGFR
ALT: 15 IU/L (ref 0–32)
AST: 15 IU/L (ref 0–40)
Albumin/Globulin Ratio: 1.5 (ref 1.1–2.5)
Albumin: 3.8 g/dL (ref 3.5–4.8)
Alkaline Phosphatase: 87 IU/L (ref 39–117)
BUN / CREAT RATIO: 12 (ref 11–26)
BUN: 12 mg/dL (ref 8–27)
Bilirubin Total: 0.3 mg/dL (ref 0.0–1.2)
CHLORIDE: 102 mmol/L (ref 97–108)
CO2: 26 mmol/L (ref 18–29)
CREATININE: 0.97 mg/dL (ref 0.57–1.00)
Calcium: 9.2 mg/dL (ref 8.7–10.3)
GFR calc non Af Amer: 58 mL/min/{1.73_m2} — ABNORMAL LOW (ref >59)
GFR, EST AFRICAN AMERICAN: 67 mL/min/{1.73_m2} (ref >59)
GLUCOSE: 99 mg/dL (ref 65–99)
Globulin, Total: 2.5 g/dL (ref 1.5–4.5)
Potassium: 4.6 mmol/L (ref 3.5–5.2)
Sodium: 143 mmol/L (ref 134–144)
TOTAL PROTEIN: 6.3 g/dL (ref 6.0–8.5)

## 2015-03-07 ENCOUNTER — Encounter (HOSPITAL_COMMUNITY): Payer: Self-pay | Admitting: Emergency Medicine

## 2015-03-07 ENCOUNTER — Emergency Department (HOSPITAL_COMMUNITY): Payer: Medicare Other

## 2015-03-07 ENCOUNTER — Inpatient Hospital Stay (HOSPITAL_COMMUNITY)
Admission: EM | Admit: 2015-03-07 | Discharge: 2015-03-15 | DRG: 292 | Disposition: A | Discharge status: Home / Self Care | Payer: Medicare Other | Attending: Internal Medicine | Admitting: Internal Medicine

## 2015-03-07 DIAGNOSIS — I440 Atrioventricular block, first degree: Secondary | ICD-10-CM | POA: Diagnosis present

## 2015-03-07 DIAGNOSIS — R0600 Dyspnea, unspecified: Secondary | ICD-10-CM | POA: Diagnosis not present

## 2015-03-07 DIAGNOSIS — I482 Chronic atrial fibrillation, unspecified: Secondary | ICD-10-CM | POA: Insufficient documentation

## 2015-03-07 DIAGNOSIS — E669 Obesity, unspecified: Secondary | ICD-10-CM | POA: Diagnosis not present

## 2015-03-07 DIAGNOSIS — G4700 Insomnia, unspecified: Secondary | ICD-10-CM | POA: Diagnosis present

## 2015-03-07 DIAGNOSIS — R600 Localized edema: Secondary | ICD-10-CM | POA: Insufficient documentation

## 2015-03-07 DIAGNOSIS — Z7982 Long term (current) use of aspirin: Secondary | ICD-10-CM

## 2015-03-07 DIAGNOSIS — I10 Essential (primary) hypertension: Secondary | ICD-10-CM | POA: Diagnosis present

## 2015-03-07 DIAGNOSIS — Z882 Allergy status to sulfonamides status: Secondary | ICD-10-CM

## 2015-03-07 DIAGNOSIS — I483 Typical atrial flutter: Secondary | ICD-10-CM | POA: Diagnosis not present

## 2015-03-07 DIAGNOSIS — Z885 Allergy status to narcotic agent status: Secondary | ICD-10-CM

## 2015-03-07 DIAGNOSIS — R55 Syncope and collapse: Secondary | ICD-10-CM | POA: Diagnosis present

## 2015-03-07 DIAGNOSIS — K210 Gastro-esophageal reflux disease with esophagitis: Secondary | ICD-10-CM | POA: Diagnosis present

## 2015-03-07 DIAGNOSIS — I509 Heart failure, unspecified: Secondary | ICD-10-CM | POA: Diagnosis not present

## 2015-03-07 DIAGNOSIS — R51 Headache: Secondary | ICD-10-CM | POA: Diagnosis present

## 2015-03-07 DIAGNOSIS — J9811 Atelectasis: Secondary | ICD-10-CM | POA: Diagnosis present

## 2015-03-07 DIAGNOSIS — Z9114 Patient's other noncompliance with medication regimen: Secondary | ICD-10-CM | POA: Diagnosis present

## 2015-03-07 DIAGNOSIS — I4892 Unspecified atrial flutter: Secondary | ICD-10-CM | POA: Diagnosis not present

## 2015-03-07 DIAGNOSIS — Z66 Do not resuscitate: Secondary | ICD-10-CM | POA: Diagnosis present

## 2015-03-07 DIAGNOSIS — I4891 Unspecified atrial fibrillation: Secondary | ICD-10-CM | POA: Diagnosis not present

## 2015-03-07 DIAGNOSIS — I5033 Acute on chronic diastolic (congestive) heart failure: Secondary | ICD-10-CM | POA: Diagnosis not present

## 2015-03-07 DIAGNOSIS — I2510 Atherosclerotic heart disease of native coronary artery without angina pectoris: Secondary | ICD-10-CM | POA: Diagnosis not present

## 2015-03-07 DIAGNOSIS — I501 Left ventricular failure: Secondary | ICD-10-CM | POA: Diagnosis present

## 2015-03-07 DIAGNOSIS — J440 Chronic obstructive pulmonary disease with acute lower respiratory infection: Secondary | ICD-10-CM | POA: Diagnosis present

## 2015-03-07 DIAGNOSIS — Z955 Presence of coronary angioplasty implant and graft: Secondary | ICD-10-CM

## 2015-03-07 DIAGNOSIS — Z87891 Personal history of nicotine dependence: Secondary | ICD-10-CM | POA: Diagnosis not present

## 2015-03-07 DIAGNOSIS — I481 Persistent atrial fibrillation: Secondary | ICD-10-CM

## 2015-03-07 DIAGNOSIS — F418 Other specified anxiety disorders: Secondary | ICD-10-CM | POA: Diagnosis not present

## 2015-03-07 DIAGNOSIS — I959 Hypotension, unspecified: Secondary | ICD-10-CM | POA: Diagnosis not present

## 2015-03-07 DIAGNOSIS — J45909 Unspecified asthma, uncomplicated: Secondary | ICD-10-CM | POA: Diagnosis present

## 2015-03-07 DIAGNOSIS — M797 Fibromyalgia: Secondary | ICD-10-CM | POA: Diagnosis present

## 2015-03-07 DIAGNOSIS — I252 Old myocardial infarction: Secondary | ICD-10-CM

## 2015-03-07 DIAGNOSIS — Z791 Long term (current) use of non-steroidal anti-inflammatories (NSAID): Secondary | ICD-10-CM | POA: Diagnosis not present

## 2015-03-07 DIAGNOSIS — E039 Hypothyroidism, unspecified: Secondary | ICD-10-CM | POA: Diagnosis present

## 2015-03-07 DIAGNOSIS — Z91041 Radiographic dye allergy status: Secondary | ICD-10-CM

## 2015-03-07 DIAGNOSIS — Z8249 Family history of ischemic heart disease and other diseases of the circulatory system: Secondary | ICD-10-CM | POA: Diagnosis not present

## 2015-03-07 DIAGNOSIS — Z79899 Other long term (current) drug therapy: Secondary | ICD-10-CM

## 2015-03-07 DIAGNOSIS — R0789 Other chest pain: Secondary | ICD-10-CM

## 2015-03-07 DIAGNOSIS — I480 Paroxysmal atrial fibrillation: Secondary | ICD-10-CM | POA: Diagnosis not present

## 2015-03-07 DIAGNOSIS — Z90710 Acquired absence of both cervix and uterus: Secondary | ICD-10-CM

## 2015-03-07 DIAGNOSIS — R609 Edema, unspecified: Secondary | ICD-10-CM

## 2015-03-07 DIAGNOSIS — T463X5A Adverse effect of coronary vasodilators, initial encounter: Secondary | ICD-10-CM | POA: Diagnosis present

## 2015-03-07 DIAGNOSIS — F32A Depression, unspecified: Secondary | ICD-10-CM | POA: Diagnosis present

## 2015-03-07 DIAGNOSIS — R0602 Shortness of breath: Secondary | ICD-10-CM | POA: Diagnosis not present

## 2015-03-07 DIAGNOSIS — I5031 Acute diastolic (congestive) heart failure: Secondary | ICD-10-CM | POA: Insufficient documentation

## 2015-03-07 DIAGNOSIS — R0682 Tachypnea, not elsewhere classified: Secondary | ICD-10-CM | POA: Diagnosis not present

## 2015-03-07 DIAGNOSIS — I25118 Atherosclerotic heart disease of native coronary artery with other forms of angina pectoris: Secondary | ICD-10-CM | POA: Diagnosis not present

## 2015-03-07 DIAGNOSIS — F329 Major depressive disorder, single episode, unspecified: Secondary | ICD-10-CM | POA: Diagnosis present

## 2015-03-07 LAB — BASIC METABOLIC PANEL
Anion gap: 8 (ref 5–15)
BUN: 10 mg/dL (ref 6–20)
CHLORIDE: 103 mmol/L (ref 101–111)
CO2: 30 mmol/L (ref 22–32)
CREATININE: 0.97 mg/dL (ref 0.44–1.00)
Calcium: 9 mg/dL (ref 8.9–10.3)
GFR calc Af Amer: >60 mL/min (ref >60)
GFR calc non Af Amer: 56 mL/min — ABNORMAL LOW (ref >60)
GLUCOSE: 116 mg/dL — AB (ref 65–99)
Potassium: 4 mmol/L (ref 3.5–5.1)
Sodium: 141 mmol/L (ref 135–145)

## 2015-03-07 LAB — CBC
HCT: 34.6 % — ABNORMAL LOW (ref 36.0–46.0)
Hemoglobin: 10.3 g/dL — ABNORMAL LOW (ref 12.0–15.0)
MCH: 23.7 pg — AB (ref 26.0–34.0)
MCHC: 29.8 g/dL — AB (ref 30.0–36.0)
MCV: 79.5 fL (ref 78.0–100.0)
PLATELETS: 260 10*3/uL (ref 150–400)
RBC: 4.35 MIL/uL (ref 3.87–5.11)
RDW: 16.8 % — AB (ref 11.5–15.5)
WBC: 7.6 10*3/uL (ref 4.0–10.5)

## 2015-03-07 LAB — TROPONIN I: Troponin I: <0.03 ng/mL (ref <0.031)

## 2015-03-07 LAB — TSH: TSH: 2.632 u[IU]/mL (ref 0.350–4.500)

## 2015-03-07 LAB — BRAIN NATRIURETIC PEPTIDE: B Natriuretic Peptide: 390.3 pg/mL — ABNORMAL HIGH (ref 0.0–100.0)

## 2015-03-07 MED ORDER — LISINOPRIL 40 MG PO TABS
40.0000 mg | ORAL_TABLET | Freq: Every day | ORAL | Status: DC
  Administered 2015-03-07 – 2015-03-08 (×2): 40 mg via ORAL
  Filled 2015-03-07 (×2): qty 1

## 2015-03-07 MED ORDER — ACETAMINOPHEN 325 MG PO TABS
650.0000 mg | ORAL_TABLET | Freq: Four times a day (QID) | ORAL | Status: DC | PRN
Start: 2015-03-07 — End: 2015-03-15
  Administered 2015-03-13 – 2015-03-14 (×2): 650 mg via ORAL
  Filled 2015-03-07 (×2): qty 2

## 2015-03-07 MED ORDER — ASPIRIN EC 81 MG PO TBEC
81.0000 mg | DELAYED_RELEASE_TABLET | Freq: Every day | ORAL | Status: DC
  Administered 2015-03-07 – 2015-03-15 (×9): 81 mg via ORAL
  Filled 2015-03-07 (×9): qty 1

## 2015-03-07 MED ORDER — POLYSACCHARIDE IRON COMPLEX 150 MG PO CAPS
150.0000 mg | ORAL_CAPSULE | Freq: Every day | ORAL | Status: DC
  Administered 2015-03-07 – 2015-03-12 (×4): 150 mg via ORAL
  Filled 2015-03-07 (×8): qty 1

## 2015-03-07 MED ORDER — DILTIAZEM LOAD VIA INFUSION
10.0000 mg | Freq: Once | INTRAVENOUS | Status: AC
  Administered 2015-03-07: 10 mg via INTRAVENOUS
  Filled 2015-03-07: qty 10

## 2015-03-07 MED ORDER — ONDANSETRON HCL 4 MG/2ML IJ SOLN
4.0000 mg | Freq: Four times a day (QID) | INTRAMUSCULAR | Status: DC | PRN
  Administered 2015-03-08 – 2015-03-09 (×2): 4 mg via INTRAVENOUS
  Filled 2015-03-07 (×2): qty 2

## 2015-03-07 MED ORDER — ESTROVEN MENOPAUSE RELIEF PO CAPS: ORAL_CAPSULE | Freq: Every day | ORAL | Status: DC

## 2015-03-07 MED ORDER — DILTIAZEM HCL 25 MG/5ML IV SOLN
10.0000 mg | Freq: Once | INTRAVENOUS | Status: AC
  Administered 2015-03-07: 10 mg via INTRAVENOUS
  Filled 2015-03-07: qty 5

## 2015-03-07 MED ORDER — HEPARIN BOLUS VIA INFUSION
4000.0000 [IU] | Freq: Once | INTRAVENOUS | Status: AC
Start: 2015-03-07 — End: 2015-03-07
  Administered 2015-03-07: 4000 [IU] via INTRAVENOUS
  Filled 2015-03-07: qty 4000

## 2015-03-07 MED ORDER — AMLODIPINE BESYLATE 5 MG PO TABS: 5.0000 mg | ORAL_TABLET | Freq: Every day | ORAL | Status: DC

## 2015-03-07 MED ORDER — ENOXAPARIN SODIUM 40 MG/0.4ML ~~LOC~~ SOLN: 40.0000 mg | SUBCUTANEOUS | Status: DC

## 2015-03-07 MED ORDER — PANTOPRAZOLE SODIUM 40 MG PO TBEC
40.0000 mg | DELAYED_RELEASE_TABLET | Freq: Every day | ORAL | Status: DC
  Administered 2015-03-08 – 2015-03-15 (×8): 40 mg via ORAL
  Filled 2015-03-07 (×9): qty 1

## 2015-03-07 MED ORDER — SODIUM CHLORIDE 0.9 % IJ SOLN
3.0000 mL | Freq: Two times a day (BID) | INTRAMUSCULAR | Status: DC
  Administered 2015-03-07 – 2015-03-15 (×10): 3 mL via INTRAVENOUS

## 2015-03-07 MED ORDER — ALPRAZOLAM 0.5 MG PO TABS
1.0000 mg | ORAL_TABLET | Freq: Four times a day (QID) | ORAL | Status: DC
  Administered 2015-03-07 – 2015-03-15 (×30): 1 mg via ORAL
  Filled 2015-03-07 (×30): qty 2

## 2015-03-07 MED ORDER — HEPARIN (PORCINE) IN NACL 100-0.45 UNIT/ML-% IJ SOLN
800.0000 [IU]/h | INTRAMUSCULAR | Status: DC
  Administered 2015-03-07: 1000 [IU]/h via INTRAVENOUS
  Administered 2015-03-10 – 2015-03-12 (×3): 850 [IU]/h via INTRAVENOUS
  Filled 2015-03-07 (×5): qty 250

## 2015-03-07 MED ORDER — DILTIAZEM HCL 30 MG PO TABS
30.0000 mg | ORAL_TABLET | Freq: Once | ORAL | Status: AC
  Administered 2015-03-07: 30 mg via ORAL
  Filled 2015-03-07 (×2): qty 1

## 2015-03-07 MED ORDER — FUROSEMIDE 10 MG/ML IJ SOLN: 40.0000 mg | Freq: Two times a day (BID) | INTRAMUSCULAR | Status: DC

## 2015-03-07 MED ORDER — CETYLPYRIDINIUM CHLORIDE 0.05 % MT LIQD
7.0000 mL | Freq: Two times a day (BID) | OROMUCOSAL | Status: DC
  Administered 2015-03-08 – 2015-03-15 (×16): 7 mL via OROMUCOSAL

## 2015-03-07 MED ORDER — METOPROLOL TARTRATE 25 MG PO TABS
25.0000 mg | ORAL_TABLET | Freq: Two times a day (BID) | ORAL | Status: DC
  Administered 2015-03-07 – 2015-03-08 (×2): 25 mg via ORAL
  Filled 2015-03-07 (×2): qty 1

## 2015-03-07 MED ORDER — NITROGLYCERIN 0.4 MG SL SUBL: 0.4000 mg | SUBLINGUAL_TABLET | SUBLINGUAL | Status: DC | PRN

## 2015-03-07 MED ORDER — FUROSEMIDE 10 MG/ML IJ SOLN
40.0000 mg | Freq: Two times a day (BID) | INTRAMUSCULAR | Status: DC
  Administered 2015-03-07 – 2015-03-08 (×2): 40 mg via INTRAVENOUS
  Filled 2015-03-07 (×2): qty 4

## 2015-03-07 MED ORDER — LEVALBUTEROL HCL 0.63 MG/3ML IN NEBU
0.6300 mg | INHALATION_SOLUTION | Freq: Four times a day (QID) | RESPIRATORY_TRACT | Status: DC
  Administered 2015-03-07 – 2015-03-09 (×5): 0.63 mg via RESPIRATORY_TRACT
  Filled 2015-03-07 (×7): qty 3

## 2015-03-07 MED ORDER — DILTIAZEM HCL 100 MG IV SOLR
5.0000 mg/h | INTRAVENOUS | Status: DC
  Administered 2015-03-07: 15 mg/h via INTRAVENOUS

## 2015-03-07 MED ORDER — LEVOTHYROXINE SODIUM 50 MCG PO TABS
50.0000 ug | ORAL_TABLET | ORAL | Status: DC
  Administered 2015-03-07 – 2015-03-15 (×4): 50 ug via ORAL
  Filled 2015-03-07 (×6): qty 1

## 2015-03-07 MED ORDER — DEXTROSE 5 % IV SOLN
5.0000 mg/h | INTRAVENOUS | Status: DC
  Administered 2015-03-07: 15 mg/h via INTRAVENOUS
  Administered 2015-03-07: 5 mg/h via INTRAVENOUS
  Filled 2015-03-07 (×3): qty 100

## 2015-03-07 NOTE — ED Notes (Signed)
Pt c/o nausea no vomiting

## 2015-03-07 NOTE — H&P (Signed)
[facility] History and Physical                                                                                    Sharon Compton, is a 74 y.o. female  MRN: [id number]   DOB - 27-Apr-1941  Admit Date - 03/07/2015  Outpatient Primary MD for the patient is Claretta Fraise, MD  Referring Physician:    Chief Complaint:   Chief Complaint  Patient presents with  . Atrial Fibrillation  . Shortness of Breath     HPI  Sharon Compton  is a 74 y.o. female, with coronary artery disease status post stenting, atrial fibrillation (chadsvasc score 4), history of GI bleeding, COPD, fibromyalgia, and diastolic heart failure. She was hospitalized by cardiology in early August 2016. At the time she had an elevated troponin 0.43, underwent cardiac cath, was found to have nonobstructive CAD, and placed on medical therapy (metoprolol, amlodipine, Imdur).  Since being discharged she states she never felt improved. She has developed worsening swelling of her lower extremities, progressive dyspnea on exertion, and significant orthopnea. She is unable to sleep due to difficulty breathing. She mentions several episodes of presyncope and dizziness. She attributes these episodes to her new medications. Despite these symptoms and she reports that she kept taking these medications until this morning. She saw her primary care physician on September 9 (note in Epic) she was advised to come to the ER on that day but decided she did not want to comment on Monday. There is some confusion about how much Lasix she has been taking recently. She denies chest pain. She is having significant shortness of breath. While she denies pain, she states it feels like someone is holding her heart in their hands and will not let it beat  Review of Systems   In addition to the HPI above,  No Fever-chills, No Headache, No changes with Vision or hearing, No problems swallowing food or Liquids, ++ She has recently been diagnosed with a left-sided  abdominal hernia and states that it is painful No Blood in stool or Urine, No dysuria, No new skin rashes or bruises, No new joints pains-aches,  No new weakness, tingling, numbness in any extremity, No recent weight gain or loss, A full 10 point Review of Systems was done, except as stated above, all other Review of Systems were negative.  Past Medical History  Past Medical History  Diagnosis Date  . Hypertension   . Anxiety and depression   . Asthma   . Fibromyalgia   . History of pneumonia   . Peripheral edema   . Peptic ulcer disease   . Hiatal hernia   . GERD (gastroesophageal reflux disease)   . A-fib     cardioversion and TEE at [facility]; pt reports DCCV 2014 at [facility] as well.  . 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 Diverticular disease 10/01/2011  . MI, acute, non ST segment elevation 10/02/2011  . GI bleed     ?recurrent--therefore, no anticoagulant  . COPD (chronic obstructive pulmonary disease)   . Lymphedema   . CAD (coronary artery disease) 02/2013    2.75 x 32 mm Rebel bare metal  stent in the distal RCA.     Past Surgical History  Procedure Laterality Date  . Abdominal hysterectomy  age 54    nonmalignant reason  . Cholecystectomy    . Abdominal exploration surgery    . Abd tumor removed      states was 10 lbs, benign  . Knee surgery    . Bladder stent    . Esophagogastroduodenoscopy  08/24/2011    Procedure: ESOPHAGOGASTRODUODENOSCOPY (EGD);  Surgeon: Daneil Dolin, MD;  [facility];  Service: Endoscopy;  Laterality: N/A;  give phenergan 12.5mg  iv 30 mins prior to procedure  . Colonoscopy  08/25/2011    Procedure: COLONOSCOPY;  Surgeon: Daneil Dolin, MD;  [facility];  Service: Endoscopy;  Laterality: N/A;  . Colonoscopy  10/03/2011    Procedure: COLONOSCOPY;  Surgeon: Daneil Dolin, MD;  [facility];  Service: Endoscopy;  Laterality: N/A;  NEEDS PHENERGAN 25 MG IV ON CALL  . Colonoscopy  10/04/2011     Procedure: COLONOSCOPY;  Surgeon: Daneil Dolin, MD;  [facility];  Service: Endoscopy;  Laterality: N/A;  Phenergan 12.5 mg ON CALL  . Transthoracic echocardiogram  09/2011    EF 60-65%, septal hypokinesia  . Cardiovascular stress test  09/2011    equivocal result, most likely low risk; pt refused the recommended cardiac cath to follow this up.  . Coronary angioplasty  02/2013  . Percutaneous coronary stent intervention (pci-s)  02/2013    2.75 x 32 mm Rebel bare metal stent in the distal RCA.   [name] Left and right heart catheterization with coronary angiogram N/A 03/03/2013    Procedure: LEFT AND RIGHT HEART CATHETERIZATION WITH CORONARY ANGIOGRAM;  Surgeon: Burnell Blanks, MD;  Location: [facility] CATH LAB;  Service: Cardiovascular;  Laterality: N/A;  . Cardiac catheterization N/A 01/24/2015    Procedure: Left Heart Cath and Coronary Angiography;  Surgeon: Troy Sine, MD;  Location: [facility] CV LAB;  Service: Cardiovascular;  Laterality: N/A;      Social History Social History  Substance Use Topics  . Smoking status: Former Smoker -- 1.00 packs/day for 15 years    Types: Cigarettes    Quit date: 06/26/1995  . Smokeless tobacco: Never Used  . Alcohol Use: No   she stopped smoking 18 years ago, but live with her husband who is a chain smoker.  Family History Family History  Problem Relation Age of Onset  . Diabetes Mother     Deceased  . Hypertension Mother   . Cancer Father     Deceased  . Colon cancer Neg Hx   . Coronary artery disease Mother   . Heart failure Mother     Prior to Admission medications   Medication Sig Start Date End Date Taking? Authorizing Provider  ALPRAZolam Duanne Moron) 1 MG tablet Take 1 tablet (1 mg total) by mouth 4 (four) times daily. Take 1 tablet 1 hour before procedure as needed for anxiety 02/04/15  Yes Claretta Fraise, MD  amLODipine (NORVASC) 5 MG tablet Take 1 tablet (5 mg total) by mouth daily. 01/25/15  Yes Brittainy Erie Noe, PA-C   aspirin EC 81 MG tablet Take 1 tablet (81 mg total) by mouth daily. HOLD ASPIRIN FOR 1 WEEK GIVEN BLEEDING; THEN OK TO RESUME 09/29/14  Yes Barton Dubois, MD  Black Cohosh-SoyIsoflav-Magnol (ESTROVEN MENOPAUSE RELIEF PO) Take 1 tablet by mouth daily.    Yes Historical Provider, MD  iron polysaccharides (NIFEREX) 150 MG capsule  Take 1 capsule (150 mg total) by mouth daily. 01/31/15  Yes Fransisca Kaufmann Dettinger, MD  isosorbide mononitrate (IMDUR) 30 MG 24 hr tablet Take 1 tablet (30 mg total) by mouth daily. 01/25/15  Yes Brittainy Erie Noe, PA-C  lisinopril (PRINIVIL,ZESTRIL) 40 MG tablet Take 1 tablet (40 mg total) by mouth daily. 12/08/14  Yes Wardell Honour, MD  metoprolol (LOPRESSOR) 50 MG tablet Take 1 tablet (50 mg total) by mouth 2 (two) times daily. 03/04/15  Yes Sharion Balloon, FNP  naproxen sodium (ANAPROX) 220 MG tablet Take 220 mg by mouth 2 (two) times daily as needed (pain).   Yes Historical Provider, MD  nitroGLYCERIN (NITROSTAT) 0.4 MG SL tablet Place 1 tablet (0.4 mg total) under the tongue every 5 (five) minutes as needed for chest pain. 01/25/15  Yes Brittainy Erie Noe, PA-C  pantoprazole (PROTONIX) 40 MG tablet Take 1 tablet (40 mg total) by mouth daily. 01/31/15  Yes Fransisca Kaufmann Dettinger, MD  SYNTHROID 50 MCG tablet 1 po every other day Patient taking differently: Take 50 mcg by mouth every other day.  07/29/14  Yes Mary-Margaret Hassell Done, FNP    Allergies  Allergen Reactions  . Iohexol Shortness Of Breath    PT STATES CONTRAST ALLERGY CAUSED SHORTNESS OF BREATH IN THE 70'S  . Dye Fdc Red [Red Dye] Other (See Comments)    Pt.states she passed out  . Sulfa Antibiotics Itching  . Codeine Itching and Palpitations    Physical Exam  Vitals  Blood pressure 126/105, pulse 84, temperature 97.6 F (36.4 C), temperature source Oral, resp. rate 22, height 5' (1.524 m), weight 85.276 kg (188 lb), SpO2 96 %.   General: Overweight female lying in bed in NAD, unable to speak in complete  sentences  Psych:  Normal affect, Not Suicidal or Homicidal, Awake Alert, Oriented X 3. Poor insight into her health issues  Neuro:   No F.N deficits, ALL C.Nerves Intact, Strength 5/5 all 4 extremities, Sensation intact all 4 extremities.  ENT:  Ears and Eyes appear Normal, Conjunctivae clear, PER. Moist oral mucosa without erythema or exudates.  Neck:  Supple, No lymphadenopathy appreciated  Respiratory:  Decreased breath sounds bilaterally, some expiratory wheeze on the right, quick, shallow inspirations  Cardiac:  Tachycardic  Irregular rate and rhythm, no frank JVD, 2+ bilateral lower extremity edema  Abdomen:  Positive bowel sounds, Soft, Non tender, Non distended,  No masses appreciated  Skin:  No Cyanosis, Normal Skin Turgor, No Skin Rash or Bruise.  Extremities:  Able to move all 4. 5/5 strength in each,  no effusions.  Data Review  CBC  Recent Labs Lab 03/07/15 1210  WBC 7.6  HGB 10.3*  HCT 34.6*  PLT 260  MCV 79.5  MCH 23.7*  MCHC 29.8*  RDW 16.8*    Chemistries   Recent Labs Lab 03/04/15 1641 03/07/15 1210  NA 143 141  K 4.6 4.0  CL 102 103  CO2 26 30  GLUCOSE 99 116*  BUN 12 10  CREATININE 0.97 0.97  CALCIUM 9.2 9.0  AST 15  --   ALT 15  --   ALKPHOS 87  --   BILITOT 0.3  --      Cardiac Enzymes  Recent Labs Lab 03/07/15 1210  TROPONINI <0.03    Imaging results:   Dg Chest Port 1 View  03/07/2015   CLINICAL DATA:  Atrial fibrillation, shortness of breath.  EXAM: PORTABLE CHEST - 1 VIEW  COMPARISON:  01/21/2015  FINDINGS:  There is a moderate right pleural effusion and small left pleural effusion. Heart is borderline in size. Mild vascular congestion. Bibasilar atelectasis.  IMPRESSION: Bilateral pleural effusions, right greater than left. Bibasilar atelectasis.  Mild vascular congestion.   Electronically Signed   By: Rolm Baptise M.D.   On: 03/07/2015 13:26    My personal review of EKG: A. fib with rapid ventricular  response.   Assessment & Plan  Principal Problem:   Acute on chronic diastolic CHF (congestive heart failure), NYHA class 3 Active Problems:   A-fib   Obesity   Peripheral edema   CAD (coronary artery disease), native coronary artery   Depression with anxiety   Acute on chronic diastolic heart failure with significant pleural effusions Will start on IV Lasix diuresis and consult cardiology. Patient may not be taking her medications correctly. Complaining of presyncopal episodes caused by cardiac medications. Will defer to cardiology with regards to beta blocker therapy. Stop all NSAIDs. Daily weights, strict I's and O's, low-salt diet. Continue lisinopril    Atrial fibrillation with rapid ventricular response Likely worsened by volume overload.  In the ER she initially responded to Cardizem push within her rate crept back up into the 150s despite oral medications and another Cardizem push Started on a diltiazem drip in the emergency department. Will be admitted to stepdown. Will defer beta blocker therapy to cardiology. Check TSH. She is not on anticoagulation despite a CHADSVASC score 4 due to GI bleed. She takes a daily 81 mg aspirin.  Dizziness / Presyncope Thought to be due to BP medications. Appreciate cardiology adjustment of medications.  Will check orthostatics daily.  PT consult.  Hypothyroidism Per the pharmacy med tech she takes her thyroid medication every other day due to patient preference. Will check TSH  History of asthma/COPD Not on medications at home. I believe her shortness of breath this point is due to her pleural effusions and volume overload rather than COPD.  I did hear a small amount of wheezing and will order Xopenex nebulizers.  Anxiety/depression Continue Xanax.    Consultants Called:  Cardiology  Family Communication:   None at bedside. Patient alert and oriented.  Code Status:  DO NOT RESUSCITATE/DO NOT INTUBATE confirmed with  patient.  Condition:  Guarded  Potential Disposition: To home when improved and okay with cardiology  Time spent in minutes : [address],  PA-C on 03/07/2015 at 4:52 PM Between 7am to 7pm - Pager - [phone] After 7pm go to [email or web address] - password [email or web address] And look for the night coverage person covering me after hours  [facility]

## 2015-03-07 NOTE — ED Notes (Signed)
Attempted report 

## 2015-03-07 NOTE — ED Notes (Signed)
Pt returned from bathroom. Monitored by pulse ox, bp cuff, and 12-lead. 

## 2015-03-07 NOTE — ED Notes (Signed)
Chelsea RN aware of heart rate.

## 2015-03-07 NOTE — ED Notes (Signed)
Cardiologist in to assess pt at this time.   

## 2015-03-07 NOTE — ED Provider Notes (Signed)
CSN: [id number]     Arrival date & time 03/07/15  1116 History   First MD Initiated Contact with Patient 03/07/15 1247     Chief Complaint  Patient presents with  . Atrial Fibrillation  . Shortness of Breath     (Consider location/radiation/quality/duration/timing/severity/associated sxs/prior Treatment) Patient is a 74 y.o. female presenting with shortness of breath. The history is provided by the patient. No language interpreter was used.  Shortness of Breath Severity:  Moderate Onset quality:  Gradual Timing:  Constant Progression:  Worsening Chronicity:  Recurrent Context: activity and smoke exposure   Relieved by:  Nothing Worsened by:  Nothing tried Ineffective treatments:  None tried Associated symptoms: no hemoptysis, no neck pain and no sputum production   Risk factors: no prolonged immobilization   Pt reports she has not been taking her metoprolol because it makes her feel bad.  Pt reports she saw her doctor at [facility] on Friday and they told her to come here because her legs were swollen and her heart was fast.   Pt decided she did not want to come in until today.  EMS reports pt was in afib with rate of 170.  They gave cardizem and pt's rate decreased to 100.   Past Medical History  Diagnosis Date  . Hypertension   . Anxiety and depression   . Asthma   . Fibromyalgia   . History of pneumonia   . Peripheral edema   . Peptic ulcer disease   . Hiatal hernia   . GERD (gastroesophageal reflux disease)   . A-fib     cardioversion and TEE at [facility]; pt reports DCCV 2014 at [facility] as well.  . 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 Diverticular disease 10/01/2011  . MI, acute, non ST segment elevation 10/02/2011  . GI bleed     ?recurrent--therefore, no anticoagulant  . COPD (chronic obstructive pulmonary disease)   . Lymphedema   . CAD (coronary artery disease) 02/2013    2.75 x 32 mm Rebel bare metal stent in the distal RCA.    Past Surgical  History  Procedure Laterality Date  . Abdominal hysterectomy  age 4    nonmalignant reason  . Cholecystectomy    . Abdominal exploration surgery    . Abd tumor removed      states was 10 lbs, benign  . Knee surgery    . Bladder stent    . Esophagogastroduodenoscopy  08/24/2011    Procedure: ESOPHAGOGASTRODUODENOSCOPY (EGD);  Surgeon: Daneil Dolin, MD;  [facility];  Service: Endoscopy;  Laterality: N/A;  give phenergan 12.5mg  iv 30 mins prior to procedure  . Colonoscopy  08/25/2011    Procedure: COLONOSCOPY;  Surgeon: Daneil Dolin, MD;  [facility];  Service: Endoscopy;  Laterality: N/A;  . Colonoscopy  10/03/2011    Procedure: COLONOSCOPY;  Surgeon: Daneil Dolin, MD;  [facility];  Service: Endoscopy;  Laterality: N/A;  NEEDS PHENERGAN 25 MG IV ON CALL  . Colonoscopy  10/04/2011    Procedure: COLONOSCOPY;  Surgeon: Daneil Dolin, MD;  [facility];  Service: Endoscopy;  Laterality: N/A;  Phenergan 12.5 mg ON CALL  . Transthoracic echocardiogram  09/2011    EF 60-65%, septal hypokinesia  . Cardiovascular stress test  09/2011    equivocal result, most likely low risk; pt refused the recommended cardiac cath to follow this up.  . Coronary angioplasty  02/2013  . Percutaneous coronary stent  intervention (pci-s)  02/2013    2.75 x 32 mm Rebel bare metal stent in the distal RCA.   [name] Left and right heart catheterization with coronary angiogram N/A 03/03/2013    Procedure: LEFT AND RIGHT HEART CATHETERIZATION WITH CORONARY ANGIOGRAM;  Surgeon: Burnell Blanks, MD;  Location: [facility] CATH LAB;  Service: Cardiovascular;  Laterality: N/A;  . Cardiac catheterization N/A 01/24/2015    Procedure: Left Heart Cath and Coronary Angiography;  Surgeon: Troy Sine, MD;  Location: [facility] LAB;  Service: Cardiovascular;  Laterality: N/A;   Family History  Problem Relation Age of Onset  . Diabetes Mother     Deceased  . Hypertension Mother   . Cancer  Father     Deceased  . Colon cancer Neg Hx   . Coronary artery disease Mother   . Heart failure Mother    Social History  Substance Use Topics  . Smoking status: Former Smoker -- 1.00 packs/day for 15 years    Types: Cigarettes    Quit date: 06/26/1995  . Smokeless tobacco: Never Used  . Alcohol Use: No   OB History    Gravida Para Term Preterm AB TAB SAB Ectopic Multiple Living   3 3 3       3      Review of Systems  Respiratory: Positive for shortness of breath. Negative for hemoptysis and sputum production.   Cardiovascular: Positive for leg swelling.  Musculoskeletal: Negative for neck pain.  All other systems reviewed and are negative.     Allergies  Iohexol; Dye fdc red; Sulfa antibiotics; and Codeine  Home Medications   Prior to Admission medications   Medication Sig Start Date End Date Taking? Authorizing Provider  ALPRAZolam Duanne Moron) 1 MG tablet Take 1 tablet (1 mg total) by mouth 4 (four) times daily. Take 1 tablet 1 hour before procedure as needed for anxiety 02/04/15   Claretta Fraise, MD  amLODipine (NORVASC) 5 MG tablet Take 1 tablet (5 mg total) by mouth daily. 01/25/15   Brittainy Erie Noe, PA-C  aspirin EC 81 MG tablet Take 1 tablet (81 mg total) by mouth daily. HOLD ASPIRIN FOR 1 WEEK GIVEN BLEEDING; THEN OK TO RESUME 09/29/14   Barton Dubois, MD  Black Cohosh-SoyIsoflav-Magnol (ESTROVEN MENOPAUSE RELIEF PO) Take 1 tablet by mouth daily.     Historical Provider, MD  DULoxetine (CYMBALTA) 30 MG capsule Take 1 capsule (30 mg total) by mouth daily. 02/04/15   Claretta Fraise, MD  iron polysaccharides (NIFEREX) 150 MG capsule Take 1 capsule (150 mg total) by mouth daily. 01/31/15   Fransisca Kaufmann Dettinger, MD  isosorbide mononitrate (IMDUR) 30 MG 24 hr tablet Take 1 tablet (30 mg total) by mouth daily. 01/25/15   Brittainy Erie Noe, PA-C  lisinopril (PRINIVIL,ZESTRIL) 40 MG tablet Take 1 tablet (40 mg total) by mouth daily. 12/08/14   Wardell Honour, MD  metoprolol (LOPRESSOR)  50 MG tablet Take 1 tablet (50 mg total) by mouth 2 (two) times daily. 03/04/15   Sharion Balloon, FNP  nitroGLYCERIN (NITROSTAT) 0.4 MG SL tablet Place 1 tablet (0.4 mg total) under the tongue every 5 (five) minutes as needed for chest pain. 01/25/15   Brittainy Erie Noe, PA-C  pantoprazole (PROTONIX) 40 MG tablet Take 1 tablet (40 mg total) by mouth daily. 01/31/15   Fransisca Kaufmann Dettinger, MD  SYNTHROID 50 MCG tablet 1 po every other day Patient taking differently: Take 50 mcg by mouth every other day.  07/29/14  Mary-Margaret Hassell Done, FNP   BP 135/95 mmHg  Pulse 102  Temp(Src) 97.6 F (36.4 C) (Oral)  Resp 26  Ht 5' (1.524 m)  Wt 188 lb (85.276 kg)  BMI 36.72 kg/m2  SpO2 98% Physical Exam  Constitutional: She is oriented to person, place, and time. She appears well-developed and well-nourished.  HENT:  Head: Normocephalic.  Right Ear: External ear normal.  Left Ear: External ear normal.  Nose: Nose normal.  Mouth/Throat: Oropharynx is clear and moist.  Eyes: Conjunctivae and EOM are normal. Pupils are equal, round, and reactive to light.  Neck: Normal range of motion.  Cardiovascular: Normal rate, regular rhythm and normal heart sounds.   Pulmonary/Chest: Effort normal.  Abdominal: Soft. She exhibits no distension.  Musculoskeletal: Normal range of motion.  Neurological: She is alert and oriented to person, place, and time.  Skin: Skin is warm.  Psychiatric: She has a normal mood and affect.  Nursing note and vitals reviewed.   ED Course  Procedures (including critical care time) Labs Review Labs Reviewed  CBC - Abnormal; Notable for the following:    Hemoglobin 10.3 (*)    HCT 34.6 (*)    MCH 23.7 (*)    MCHC 29.8 (*)    RDW 16.8 (*)    All other components within normal limits  BASIC METABOLIC PANEL    Imaging Review No results found. I have personally reviewed and evaluated these images and lab results as part of my medical decision-making.   EKG  Interpretation   Date/Time:  Monday March 07 2015 11:35:00 EDT Ventricular Rate:  155 PR Interval:    QRS Duration: 84 QT Interval:  315 QTC Calculation: 506 R Axis:   103 Text Interpretation:  Atrial fibrillation with rapid V-rate Right axis  deviation Repolarization abnormality, prob rate related Atrial  fibrillation Confirmed by Gerald Leitz ([id number]) on 03/07/2015 1:16:21  PM      MDM Heart rate initially elevated but decreased.  Pt given Iv cardizem.   I will give oral cardizem.  Chest xray shows bilat pleural effusions.     Final diagnoses:  Chronic atrial fibrillation  Rapid atrial fibrillation  Bilateral edema of lower extremity  Congestive heart failure, unspecified congestive heart failure chronicity, unspecified congestive heart failure type    I spoke with Leone Payor  Who will admit.     Hollace Kinnier [place], PA-C 03/07/15 [place], MD 03/07/15 1544
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
## 2015-03-07 NOTE — ED Notes (Signed)
To ED via [facility] from home, with c/o shortness of breath for 4 days--on EMS arrival pt had Afib with RVR at rate of 170. Received CARDIZEM 20 mg IV, pt's heart rate came down to 100's, 110's. On arrival to ED, pt is alert/oriented x 4, pt is short of breath on exertion. sats on room air- 92%-- placed on 2l/m/[place].

## 2015-03-07 NOTE — Progress Notes (Addendum)
ANTICOAGULATION CONSULT NOTE - Initial Consult  Pharmacy Consult for heparin Indication: atrial fibrillation  Allergies  Allergen Reactions  . Iohexol Shortness Of Breath    PT STATES CONTRAST ALLERGY CAUSED SHORTNESS OF BREATH IN THE 70'S  . Dye Fdc Red [Red Dye] Other (See Comments)    Pt.states she passed out  . Sulfa Antibiotics Itching  . Codeine Itching and Palpitations    Patient Measurements: Height: 5' (152.4 cm) Weight: 192 lb 1.6 oz (87.136 kg) IBW/kg (Calculated) : 45.5 Heparin Dosing Weight: 66 kg  Vital Signs: Temp: 97.9 F (36.6 C) (09/12 1821) Temp Source: Oral (09/12 1821) BP: 132/83 mmHg (09/12 1900) Pulse Rate: 77 (09/12 1821)  Labs:  Recent Labs  03/07/15 1210  HGB 10.3*  HCT 34.6*  PLT 260  CREATININE 0.97  TROPONINI <0.03    Estimated Creatinine Clearance: 49.9 mL/min (by C-G formula based on Cr of 0.97).   Medical History: Past Medical History  Diagnosis Date  . Hypertension   . Anxiety and depression   . Asthma   . Fibromyalgia   . History of pneumonia   . Peripheral edema   . Peptic ulcer disease   . Hiatal hernia   . GERD (gastroesophageal reflux disease)   . A-fib     cardioversion and TEE at [facility]; pt reports DCCV 2014 at [facility] as well.  . 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 Diverticular disease 10/01/2011  . MI, acute, non ST segment elevation 10/02/2011  . GI bleed     ?recurrent--therefore, no anticoagulant  . COPD (chronic obstructive pulmonary disease)   . Lymphedema   . CAD (coronary artery disease) 02/2013    2.75 x 32 mm Rebel bare metal stent in the distal RCA.     Medications:  Prescriptions prior to admission  Medication Sig Dispense Refill Last Dose  . ALPRAZolam (XANAX) 1 MG tablet Take 1 tablet (1 mg total) by mouth 4 (four) times daily. Take 1 tablet 1 hour before procedure as needed for anxiety 120 tablet 0 03/06/2015 at Unknown time  . amLODipine (NORVASC) 5 MG tablet Take 1 tablet (5 mg  total) by mouth daily. 30 tablet 5 03/06/2015 at Unknown time  . aspirin EC 81 MG tablet Take 1 tablet (81 mg total) by mouth daily. HOLD ASPIRIN FOR 1 WEEK GIVEN BLEEDING; THEN OK TO RESUME   03/07/2015 at Unknown time  . Black Cohosh-SoyIsoflav-Magnol (ESTROVEN MENOPAUSE RELIEF PO) Take 1 tablet by mouth daily.    03/06/2015 at Unknown time  . iron polysaccharides (NIFEREX) 150 MG capsule Take 1 capsule (150 mg total) by mouth daily. 30 capsule 5 03/06/2015 at Unknown time  . isosorbide mononitrate (IMDUR) 30 MG 24 hr tablet Take 1 tablet (30 mg total) by mouth daily. 30 tablet 5 03/06/2015 at Unknown time  . lisinopril (PRINIVIL,ZESTRIL) 40 MG tablet Take 1 tablet (40 mg total) by mouth daily. 90 tablet 1 03/06/2015 at Unknown time  . metoprolol (LOPRESSOR) 50 MG tablet Take 1 tablet (50 mg total) by mouth 2 (two) times daily. 180 tablet 3 03/06/2015 at 1700  . naproxen sodium (ANAPROX) 220 MG tablet Take 220 mg by mouth 2 (two) times daily as needed (pain).   Past Week at Unknown time  . nitroGLYCERIN (NITROSTAT) 0.4 MG SL tablet Place 1 tablet (0.4 mg total) under the tongue every 5 (five) minutes as needed for chest pain. 30 tablet 2 03/06/2015 at Unknown time  . pantoprazole (PROTONIX) 40 MG tablet Take 1 tablet (  40 mg total) by mouth daily. 90 tablet 1 03/06/2015 at Unknown time  . SYNTHROID 50 MCG tablet 1 po every other day (Patient taking differently: Take 50 mcg by mouth every other day. ) 90 tablet 1 Past Week at Unknown time    Assessment: 74 yo F with Afib and SOB.  Pharmacy consulted to dose heparin for Afib.  PMH significant for recurrent GIB.   Not on anticoagulation PTA due to GI bleed.   Wt 87.1 kg, HDW 66 kg, H/H 10.3/34.6 PLTC WNL.  Creat 0.97  Goal of Therapy:  Heparin level 0.3-0.7 units/ml Monitor platelets by anticoagulation protocol: Yes   Plan: - heparin 4000 unit bolus -heparin drip at 1000 units/hr - check 8 hr HL -daily HL and CBC while on heparin   Eudelia Bunch,  Pharm.D. [phone] 03/07/2015 7:20 PM

## 2015-03-08 ENCOUNTER — Encounter (HOSPITAL_COMMUNITY): Payer: Self-pay | Admitting: Physician Assistant

## 2015-03-08 ENCOUNTER — Ambulatory Visit: Payer: Medicare Other | Admitting: Family Medicine

## 2015-03-08 DIAGNOSIS — I4891 Unspecified atrial fibrillation: Secondary | ICD-10-CM

## 2015-03-08 DIAGNOSIS — R600 Localized edema: Secondary | ICD-10-CM

## 2015-03-08 DIAGNOSIS — I480 Paroxysmal atrial fibrillation: Secondary | ICD-10-CM

## 2015-03-08 DIAGNOSIS — I482 Chronic atrial fibrillation, unspecified: Secondary | ICD-10-CM | POA: Insufficient documentation

## 2015-03-08 DIAGNOSIS — E032 Hypothyroidism due to medicaments and other exogenous substances: Secondary | ICD-10-CM

## 2015-03-08 DIAGNOSIS — I2510 Atherosclerotic heart disease of native coronary artery without angina pectoris: Secondary | ICD-10-CM

## 2015-03-08 LAB — CBC
HCT: 32.3 % — ABNORMAL LOW (ref 36.0–46.0)
Hemoglobin: 9.5 g/dL — ABNORMAL LOW (ref 12.0–15.0)
MCH: 23.3 pg — AB (ref 26.0–34.0)
MCHC: 29.4 g/dL — AB (ref 30.0–36.0)
MCV: 79.2 fL (ref 78.0–100.0)
PLATELETS: 232 10*3/uL (ref 150–400)
RBC: 4.08 MIL/uL (ref 3.87–5.11)
RDW: 16.8 % — AB (ref 11.5–15.5)
WBC: 7.7 10*3/uL (ref 4.0–10.5)

## 2015-03-08 LAB — BASIC METABOLIC PANEL
Anion gap: 7 (ref 5–15)
BUN: 8 mg/dL (ref 6–20)
CHLORIDE: 103 mmol/L (ref 101–111)
CO2: 31 mmol/L (ref 22–32)
CREATININE: 1.01 mg/dL — AB (ref 0.44–1.00)
Calcium: 8.7 mg/dL — ABNORMAL LOW (ref 8.9–10.3)
GFR, EST NON AFRICAN AMERICAN: 53 mL/min — AB (ref >60)
Glucose, Bld: 123 mg/dL — ABNORMAL HIGH (ref 65–99)
Potassium: 3.5 mmol/L (ref 3.5–5.1)
SODIUM: 141 mmol/L (ref 135–145)

## 2015-03-08 LAB — HEPARIN LEVEL (UNFRACTIONATED)
HEPARIN UNFRACTIONATED: 0.61 [IU]/mL (ref 0.30–0.70)
Heparin Unfractionated: 0.89 IU/mL — ABNORMAL HIGH (ref 0.30–0.70)

## 2015-03-08 LAB — TROPONIN I

## 2015-03-08 LAB — MRSA PCR SCREENING: MRSA BY PCR: NEGATIVE

## 2015-03-08 MED ORDER — AMIODARONE LOAD VIA INFUSION
150.0000 mg | Freq: Once | INTRAVENOUS | Status: AC
  Administered 2015-03-08: 150 mg via INTRAVENOUS
  Filled 2015-03-08: qty 83.34

## 2015-03-08 MED ORDER — AMIODARONE HCL IN DEXTROSE 360-4.14 MG/200ML-% IV SOLN
30.0000 mg/h | INTRAVENOUS | Status: DC
  Administered 2015-03-08 – 2015-03-12 (×7): 30 mg/h via INTRAVENOUS
  Filled 2015-03-08 (×9): qty 200

## 2015-03-08 MED ORDER — POTASSIUM CHLORIDE CRYS ER 20 MEQ PO TBCR
20.0000 meq | EXTENDED_RELEASE_TABLET | Freq: Every day | ORAL | Status: DC
  Administered 2015-03-08 – 2015-03-15 (×8): 20 meq via ORAL
  Filled 2015-03-08 (×8): qty 1

## 2015-03-08 MED ORDER — IPRATROPIUM BROMIDE 0.02 % IN SOLN
0.5000 mg | Freq: Four times a day (QID) | RESPIRATORY_TRACT | Status: DC
  Administered 2015-03-08 – 2015-03-11 (×11): 0.5 mg via RESPIRATORY_TRACT
  Filled 2015-03-08 (×14): qty 2.5

## 2015-03-08 MED ORDER — PREDNISONE 20 MG PO TABS
40.0000 mg | ORAL_TABLET | Freq: Every day | ORAL | Status: DC
  Administered 2015-03-08 – 2015-03-09 (×2): 40 mg via ORAL
  Filled 2015-03-08 (×2): qty 2

## 2015-03-08 MED ORDER — FUROSEMIDE 10 MG/ML IJ SOLN
20.0000 mg | Freq: Two times a day (BID) | INTRAMUSCULAR | Status: DC
  Administered 2015-03-08 – 2015-03-09 (×2): 20 mg via INTRAVENOUS
  Filled 2015-03-08 (×2): qty 2

## 2015-03-08 MED ORDER — AMIODARONE HCL IN DEXTROSE 360-4.14 MG/200ML-% IV SOLN
60.0000 mg/h | INTRAVENOUS | Status: AC
  Administered 2015-03-08: 60 mg/h via INTRAVENOUS
  Filled 2015-03-08: qty 200

## 2015-03-08 NOTE — Progress Notes (Signed)
ANTICOAGULATION CONSULT NOTE - Follow - up [place] for heparin Indication: atrial fibrillation  Allergies  Allergen Reactions  . Iohexol Shortness Of Breath    PT STATES CONTRAST ALLERGY CAUSED SHORTNESS OF BREATH IN THE 70'S  . Dye Fdc Red [Red Dye] Other (See Comments)    Pt.states she passed out  . Sulfa Antibiotics Itching  . Codeine Itching and Palpitations    Patient Measurements: Height: 5' (152.4 cm) Weight: 192 lb 1.6 oz (87.136 kg) IBW/kg (Calculated) : 45.5 Heparin Dosing Weight: 66 kg  Vital Signs: Temp: 97.8 F (36.6 C) (09/13 0132) Temp Source: Oral (09/13 0132) BP: 106/73 mmHg (09/13 0125) Pulse Rate: 72 (09/13 0125)  Labs:  Recent Labs  03/07/15 1210 03/07/15 2020 03/08/15 0113  HGB 10.3*  --  9.5*  HCT 34.6*  --  32.3*  PLT 260  --  232  HEPARINUNFRC  --   --  0.89*  CREATININE 0.97  --  1.01*  TROPONINI <0.03 <0.03 <0.03    Estimated Creatinine Clearance: 47.9 mL/min (by C-G formula based on Cr of 1.01).  Assessment: 74 yo F on heparin for Afib. Heparin level supratherapeutic (0.89) - drawn about 6 hour post gtt start instead of 8 hr. No bleeding noted.  PMH significant for recurrent GIB. Not on anticoagulation PTA due to GI bleed.   Goal of Therapy:  Heparin level 0.3-0.7 units/ml Monitor platelets by anticoagulation protocol: Yes   Plan: - Decrease heparin drip to 850 units/hr - Check 8 hr HL  Sherlon Handing, PharmD, BCPS Clinical pharmacist, pager [phone] 03/08/2015 2:10 AM

## 2015-03-08 NOTE — Care Management Note (Addendum)
Case Management Note  Patient Details  Name: Sharon Compton MRN: [id number] Date of Birth: 1941-05-11  Subjective/Objective: Pt admitted for Afib- Initiated on IV Cardizem gtt. CXR revealed Bilateral pleural effusions, right greater than left. Bibasilar atelectasis.   Action/Plan:  CM received consult for HF screen: CM will continue to monitor for disposition needs.    Expected Discharge Date:                  Expected Discharge Plan:  [place]  In-House Referral:     Discharge planning Services  CM Consult  Post Acute Care Choice:    Choice offered to:     DME Arranged:    DME Agency:     HH Arranged:    [place] Agency:     Status of Service:  In process, will continue to follow  Medicare Important Message Given:    Date Medicare IM Given:    Medicare IM give by:    Date Additional Medicare IM Given:    Additional Medicare Important Message give by:     If discussed at [place] of Stay Meetings, dates discussed:    Additional Comments:  1139 03-15-15 Jacqlyn Krauss, RN,BSN [phone] Pt plan for d/c today. Pt refusing [facility]. Pt states need transportation services. CM did speak with CSW to assist with transportation needs. No further needs from CM at this time.   1610 03-14-15 Jacqlyn Krauss, RN,BSN [phone] Pt on po amiodarone. Per MD notes plan to monitor overnight. Hopefully HR stable for d/c home 03-15-15.   1456 03-11-15 Jacqlyn Krauss, RN,BSN [phone] CM did speak with pt again on how beneficial HH PT would be. Pt is refusing services at this time.    Benefits Check: Per rep at [facility]: covered, tier 3, $3.60 for 30 day supply   Xarelto: covered, tier 3, $3.60 for 30 day supply   No auth required   Patient can use any retail pharmacy   4656 03-11-15 Brenda Graves-Bigelow, RN, BSN [phone] CM did speak with pt in regards to home health needs. Per pt she is home with her husband. Per pt she  was driving prior to admission. Pt seems very deconditioned upon visit this am. Pt not leaning towards [facility] services at this time. Pt feels like if she can start moving she will be okay. CM did place a benefits check for anticoagulants. Not sure at this time if she will be a candidate, however will at least know the co pay. CM will continue to monitor.    Bethena Roys, RN   03/08/2015, 12:10 PM

## 2015-03-08 NOTE — Progress Notes (Signed)
Patient Name: Sharon Compton Date of Encounter: 03/08/2015  Principal Problem:   Acute on chronic diastolic CHF (congestive heart failure), NYHA class 3 Active Problems:   Obesity   Peripheral edema   CAD (coronary artery disease), native coronary artery   Depression with anxiety   A-fib   Heart failure   Bilateral edema of lower extremity   Rapid atrial fibrillation   Thyroid activity decreased   Primary Cardiologist: Dr Bronson Ing  Patient Profile: 74 yo female w/ PAF, no anticoag 2nd hx GIB, HTN, BMS RCA 2014, COPD, NSTEMI 01/21/2015 w/ non-obs dz, nl EF, med rx. Admitted 09/12 w/ SOB, CHF and afib RVR.  SUBJECTIVE: Feels weak, gets light-headed when she stands up. Body is tender in multiple areas, including legs, abdomen.  OBJECTIVE Filed Vitals:   03/08/15 0956 03/08/15 1037 03/08/15 1046 03/08/15 1122  BP: 102/48   103/54  Pulse: 120   86  Temp:    98.2 F (36.8 C)  TempSrc:    Oral  Resp:    18  Height:      Weight:      SpO2:  97% 88% 96%    Intake/Output Summary (Last 24 hours) at 03/08/15 1227 Last data filed at 03/08/15 1000  Gross per 24 hour  Intake    363 ml  Output   1975 ml  Net  -1612 ml   Filed Weights   03/07/15 1126 03/07/15 1821 03/08/15 0554  Weight: 188 lb (85.276 kg) 192 lb 1.6 oz (87.136 kg) 188 lb 1.6 oz (85.322 kg)    PHYSICAL EXAM General: Well developed, well nourished, female in no acute distress. Head: Normocephalic, atraumatic.  Neck: Supple without bruits, JVD difficult to assess 2nd body habitus. Lungs:  Resp regular and unlabored, decreased BS bases w/ rales. Heart: Irreg R&R, S1, S2, no S3, S4, or murmur; no rub. Abdomen: Soft, non-tender, non-distended, BS + x 4.  Extremities: No clubbing, cyanosis, no obvious edema.  Neuro: Alert and oriented X 3. Moves all extremities spontaneously. Psych: Normal affect.  LABS: CBC:  Recent Labs  03/07/15 1210 03/08/15 0113  WBC 7.6 7.7  HGB 10.3* 9.5*  HCT 34.6* 32.3*    MCV 79.5 79.2  PLT 260 998   Basic Metabolic Panel:  Recent Labs  03/07/15 1210 03/08/15 0113  NA 141 141  K 4.0 3.5  CL 103 103  CO2 30 31  GLUCOSE 116* 123*  BUN 10 8  CREATININE 0.97 1.01*  CALCIUM 9.0 8.7*   Cardiac Enzymes:  Recent Labs  03/07/15 2020 03/08/15 0113 03/08/15 0747  TROPONINI <0.03 <0.03 <0.03   BNP:  B NATRIURETIC PEPTIDE  Date/Time Value Ref Range Status  03/07/2015 12:10 PM 390.3* 0.0 - 100.0 pg/mL Final  01/21/2015 08:30 AM 243.0* 0.0 - 100.0 pg/mL Final   Thyroid Function Tests:  Recent Labs  03/07/15 2020  TSH 2.632   TELE:  Atrial fib, RVR most of the time, no pauses > 2 sec      Radiology/Studies: Dg Chest Port 1 View 03/07/2015   CLINICAL DATA:  Atrial fibrillation, shortness of breath.  EXAM: PORTABLE CHEST - 1 VIEW  COMPARISON:  01/21/2015  FINDINGS: There is a moderate right pleural effusion and small left pleural effusion. Heart is borderline in size. Mild vascular congestion. Bibasilar atelectasis.  IMPRESSION: Bilateral pleural effusions, right greater than left. Bibasilar atelectasis.  Mild vascular congestion.   Electronically Signed   By: Rolm Baptise M.D.   On: 03/07/2015  13:26    Current Medications:  . ALPRAZolam  1 mg Oral QID  . antiseptic oral rinse  7 mL Mouth Rinse BID  . aspirin EC  81 mg Oral Daily  . furosemide  40 mg Intravenous BID  . iron polysaccharides  150 mg Oral Daily  . levalbuterol  0.63 mg Nebulization Q6H  . levothyroxine  50 mcg Oral QODAY  . lisinopril  40 mg Oral Daily  . metoprolol  25 mg Oral BID  . pantoprazole  40 mg Oral Daily  . sodium chloride  3 mL Intravenous Q12H   . diltiazem (CARDIZEM) infusion 15 mg/hr (03/07/15 1825)  . diltiazem (CARDIZEM) infusion 10 mg/hr (03/08/15 1000)  . heparin 850 Units/hr (03/08/15 1000)    ASSESSMENT AND PLAN:   Acute on chronic diastolic CHF (congestive heart failure), NYHA class 3 - wt was 181 at discharge on 01/25/2015, feel this is dry  weight - I/O are incomplete - continue IV Lasix, make sure I/O are negative, may have to increase dose - echo ordered    Rapid atrial fibrillation - metop 25 mg bid and IV Cardizem at 10 mg/hr - continue both for now, rate not well controlled - may have to decrease Cardizem if BP will not tolerate - Records reviewed, she has been on and taken off Cardizem in the past and her metoprolol dose has been up and down.  - was on amiodarone at d/c 10/2013 but TSH went up and it was d/c'd - continue to follow    CAD (coronary artery disease), native coronary artery - hx BMS RCA - no acute ischemic sx - ez neg MI - on ASA, BB - note from 11/03/2013, note made that she does not tolerate statins. Will let primary cardiologist address as OP   Otherwise, per IM Active Problems:   Obesity   Peripheral edema   Depression with anxiety   A-fib   Heart failure   Bilateral edema of lower extremity   Thyroid activity decreased  Signed, Barrett, Suanne Marker , PA-C 12:27 PM 03/08/2015  The patient was seen, examined and discussed with Rosaria Ferries, PA-C and I agree with the above.   74 year old female with h/o PAF in the settings of acute bronchitis and possible non-compliance to her meds. She is activelly wheezing and in acute on chronic diastolic CHF sec to a-fib with RVR.  I will ad steroids and atrovent for wheezing, also lasix 20 mg iv Q6H, she is not able to maintain HR< 100 with cardizem drip and is hypotensive, I would discontinue [place] BB as she is wheezing and start amiodarone drip, her most recent TSH from yesterday is normal.  She is not on anticoagulation as she has h/o GIB, rhythm control is key here.   Dorothy Spark 03/08/2015

## 2015-03-08 NOTE — Progress Notes (Signed)
[facility] PROGRESS NOTE  JEANA KERSTING IFO:[id number] DOB: 1940/11/18 DOA: 03/07/2015 PCP: Claretta Fraise, MD  Assessment/Plan:  Principal Problem:   Acute on chronic diastolic CHF (congestive heart failure), NYHA class 3 Active Problems:   Obesity   Peripheral edema   CAD (coronary artery disease), native coronary artery   Depression with anxiety   A-fib   Heart failure   Bilateral edema of lower extremity   Rapid atrial fibrillation   Thyroid activity decreased  On cardizem gtt and IV lasix with borderline blood pressures. Can likely stop heparin gtt. Will d/w cardiology  HPI/Subjective: C/o dyspnea. Various pains.  Objective: Filed Vitals:   03/08/15 1122  BP: 103/54  Pulse: 86  Temp: 98.2 F (36.8 C)  Resp: 18    Intake/Output Summary (Last 24 hours) at 03/08/15 1249 Last data filed at 03/08/15 1000  Gross per 24 hour  Intake    363 ml  Output   1975 ml  Net  -1612 ml   Filed Weights   03/07/15 1126 03/07/15 1821 03/08/15 0554  Weight: 85.276 kg (188 lb) 87.136 kg (192 lb 1.6 oz) 85.322 kg (188 lb 1.6 oz)    Exam:   General:  In chair. Appears comfortable  Cardiovascular: irreg irreg. rast  Respiratory: CTA without WRR  Abdomen: obese, s, nt, nd  Ext: edema present, not really pitting.  Basic Metabolic Panel:  Recent Labs Lab 03/04/15 1641 03/07/15 1210 03/08/15 0113  NA 143 141 141  K 4.6 4.0 3.5  CL 102 103 103  CO2 26 30 31   GLUCOSE 99 116* 123*  BUN 12 10 8   CREATININE 0.97 0.97 1.01*  CALCIUM 9.2 9.0 8.7*   Liver Function Tests:  Recent Labs Lab 03/04/15 1641  AST 15  ALT 15  ALKPHOS 87  BILITOT 0.3  PROT 6.3   No results for input(s): LIPASE, AMYLASE in the last 168 hours. No results for input(s): AMMONIA in the last 168 hours. CBC:  Recent Labs Lab 03/07/15 1210 03/08/15 0113  WBC 7.6 7.7  HGB 10.3* 9.5*  HCT 34.6* 32.3*  MCV 79.5 79.2  PLT 260 232   Cardiac Enzymes:  Recent Labs Lab  03/07/15 1210 03/07/15 2020 03/08/15 0113 03/08/15 0747  TROPONINI <0.03 <0.03 <0.03 <0.03   BNP (last 3 results)  Recent Labs  01/21/15 0830 03/07/15 1210  BNP 243.0* 390.3*    ProBNP (last 3 results) No results for input(s): PROBNP in the last 8760 hours.  CBG: No results for input(s): GLUCAP in the last 168 hours.  Recent Results (from the past 240 hour(s))  MRSA PCR Screening     Status: None   Collection Time: 03/07/15 10:51 PM  Result Value Ref Range Status   MRSA by PCR NEGATIVE NEGATIVE Final    Comment:        The GeneXpert MRSA Assay (FDA approved for NASAL specimens only), is one component of a comprehensive MRSA colonization surveillance program. It is not intended to diagnose MRSA infection nor to guide or monitor treatment for MRSA infections.      Studies: Dg Chest Port 1 View  03/07/2015   CLINICAL DATA:  Atrial fibrillation, shortness of breath.  EXAM: PORTABLE CHEST - 1 VIEW  COMPARISON:  01/21/2015  FINDINGS: There is a moderate right pleural effusion and small left pleural effusion. Heart is borderline in size. Mild vascular congestion. Bibasilar atelectasis.  IMPRESSION: Bilateral pleural effusions, right greater than left. Bibasilar atelectasis.  Mild vascular congestion.   Electronically Signed  By: Rolm Baptise M.D.   On: 03/07/2015 13:26    Scheduled Meds: . ALPRAZolam  1 mg Oral QID  . antiseptic oral rinse  7 mL Mouth Rinse BID  . aspirin EC  81 mg Oral Daily  . furosemide  40 mg Intravenous BID  . iron polysaccharides  150 mg Oral Daily  . levalbuterol  0.63 mg Nebulization Q6H  . levothyroxine  50 mcg Oral QODAY  . lisinopril  40 mg Oral Daily  . metoprolol  25 mg Oral BID  . pantoprazole  40 mg Oral Daily  . sodium chloride  3 mL Intravenous Q12H   Continuous Infusions: . diltiazem (CARDIZEM) infusion 15 mg/hr (03/07/15 1825)  . diltiazem (CARDIZEM) infusion 10 mg/hr (03/08/15 1000)  . heparin 850 Units/hr (03/08/15 1000)     Time spent: 25 minutes  [facility] [email or web address], password [facility] 03/08/2015, 12:49 PM  LOS: 1 day

## 2015-03-08 NOTE — Progress Notes (Signed)
ANTICOAGULATION CONSULT NOTE - Follow - up [place] for heparin Indication: atrial fibrillation  Allergies  Allergen Reactions  . Iohexol Shortness Of Breath    PT STATES CONTRAST ALLERGY CAUSED SHORTNESS OF BREATH IN THE 70'S  . Dye Fdc Red [Red Dye] Other (See Comments)    Pt.states she passed out  . Sulfa Antibiotics Itching  . Codeine Itching and Palpitations    Patient Measurements: Height: 5' (152.4 cm) Weight: 188 lb 1.6 oz (85.322 kg) IBW/kg (Calculated) : 45.5 Heparin Dosing Weight: 66 kg  Vital Signs: Temp: 98.2 F (36.8 C) (09/13 1122) Temp Source: Oral (09/13 1122) BP: 103/54 mmHg (09/13 1122) Pulse Rate: 86 (09/13 1122)  Labs:  Recent Labs  03/07/15 1210 03/07/15 2020 03/08/15 0113 03/08/15 0747 03/08/15 1145  HGB 10.3*  --  9.5*  --   --   HCT 34.6*  --  32.3*  --   --   PLT 260  --  232  --   --   HEPARINUNFRC  --   --  0.89*  --  0.61  CREATININE 0.97  --  1.01*  --   --   TROPONINI <0.03 <0.03 <0.03 <0.03  --     Estimated Creatinine Clearance: 47.4 mL/min (by C-G formula based on Cr of 1.01).  Assessment: 74 yo F on heparin for Afib, heparin level now therapeutic  PMH significant for recurrent GIB. Not on anticoagulation PTA due to GI bleed.   Goal of Therapy:  Heparin level 0.3-0.7 units/ml Monitor platelets by anticoagulation protocol: Yes   Plan: - Continue heparin drip at 850 units/hr - Follow up AM labs  Thank you Anette Guarneri, PharmD [phone] 03/08/2015 1:01 PM

## 2015-03-08 NOTE — Consult Note (Signed)
   [facility] Inpatient Consult   03/08/2015  Sharon Compton [birth year] [id number]   Patient evaluated for [place] [facility] services. Came to bedside to explain and discuss  [place] [facility] services. Patient declines [place] [facility] services and states that " me and my husband are loners, we do not like people in our stuff". Tried to reiterate that [facility] is a disease management program that could assist in her managing her CHF and medications better. [name] continued to decline services. Left brochure at bedside in case she changed her mind in the future. Made inpatient RNCM aware.   Marthenia Rolling, MSN-Ed, RN,BSN [facility] Liaison [phone]

## 2015-03-08 NOTE — Progress Notes (Signed)
Utilization review completed.  

## 2015-03-08 NOTE — Progress Notes (Signed)
Pt refuse her 2am breathing neb, pt stated that she wanted to rest. Pt is stable at this time, o2 saturations presents at 95% on 2L [place]. Pt breath sounds are clear and diminished. No increase WOB noted.

## 2015-03-09 ENCOUNTER — Telehealth: Payer: Self-pay | Admitting: Family Medicine

## 2015-03-09 ENCOUNTER — Inpatient Hospital Stay (HOSPITAL_COMMUNITY): Payer: Medicare Other

## 2015-03-09 DIAGNOSIS — I5031 Acute diastolic (congestive) heart failure: Secondary | ICD-10-CM

## 2015-03-09 LAB — BASIC METABOLIC PANEL
ANION GAP: 8 (ref 5–15)
BUN: 8 mg/dL (ref 6–20)
CHLORIDE: 97 mmol/L — AB (ref 101–111)
CO2: 31 mmol/L (ref 22–32)
Calcium: 8.6 mg/dL — ABNORMAL LOW (ref 8.9–10.3)
Creatinine, Ser: 1.09 mg/dL — ABNORMAL HIGH (ref 0.44–1.00)
GFR, EST AFRICAN AMERICAN: 57 mL/min — AB (ref >60)
GFR, EST NON AFRICAN AMERICAN: 49 mL/min — AB (ref >60)
Glucose, Bld: 149 mg/dL — ABNORMAL HIGH (ref 65–99)
POTASSIUM: 4 mmol/L (ref 3.5–5.1)
SODIUM: 136 mmol/L (ref 135–145)

## 2015-03-09 LAB — CBC
HCT: 31.7 % — ABNORMAL LOW (ref 36.0–46.0)
HEMOGLOBIN: 9.2 g/dL — AB (ref 12.0–15.0)
MCH: 23.3 pg — ABNORMAL LOW (ref 26.0–34.0)
MCHC: 29 g/dL — AB (ref 30.0–36.0)
MCV: 80.3 fL (ref 78.0–100.0)
Platelets: 181 10*3/uL (ref 150–400)
RBC: 3.95 MIL/uL (ref 3.87–5.11)
RDW: 16.9 % — ABNORMAL HIGH (ref 11.5–15.5)
WBC: 4.1 10*3/uL (ref 4.0–10.5)

## 2015-03-09 LAB — HEPARIN LEVEL (UNFRACTIONATED): HEPARIN UNFRACTIONATED: 0.53 [IU]/mL (ref 0.30–0.70)

## 2015-03-09 MED ORDER — FUROSEMIDE 10 MG/ML IJ SOLN
20.0000 mg | Freq: Four times a day (QID) | INTRAMUSCULAR | Status: DC
  Administered 2015-03-09 – 2015-03-11 (×8): 20 mg via INTRAVENOUS
  Filled 2015-03-09 (×8): qty 2

## 2015-03-09 MED ORDER — LEVALBUTEROL HCL 0.63 MG/3ML IN NEBU
0.6300 mg | INHALATION_SOLUTION | Freq: Three times a day (TID) | RESPIRATORY_TRACT | Status: DC
  Administered 2015-03-09 – 2015-03-11 (×8): 0.63 mg via RESPIRATORY_TRACT
  Filled 2015-03-09 (×10): qty 3

## 2015-03-09 MED ORDER — DIGOXIN 125 MCG PO TABS
0.1250 mg | ORAL_TABLET | Freq: Every day | ORAL | Status: DC
  Administered 2015-03-10 – 2015-03-15 (×6): 0.125 mg via ORAL
  Filled 2015-03-09 (×8): qty 1

## 2015-03-09 MED ORDER — DIGOXIN 0.25 MG/ML IJ SOLN
0.2500 mg | Freq: Once | INTRAMUSCULAR | Status: AC
  Administered 2015-03-09: 0.25 mg via INTRAVENOUS
  Filled 2015-03-09 (×2): qty 1

## 2015-03-09 MED ORDER — SODIUM CHLORIDE 0.9 % IV BOLUS (SEPSIS)
250.0000 mL | Freq: Once | INTRAVENOUS | Status: AC
  Administered 2015-03-09: 250 mL via INTRAVENOUS
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
## 2015-03-09 NOTE — Evaluation (Signed)
Physical Therapy Evaluation Patient Details Name: Sharon Compton MRN: [id number] DOB: [birth year] Today's Date: 03/09/2015   History of Present Illness  74 yo F admitted to [facility] on 9/12 for worsening SOB and wheezing over the last month, chest pain, orthopnea, and LE edema. Diagnosed with acute chronic diastolic CHF, class 3. Pt has a PMH of A-fib, COPD, HTN and a surgical history of knee surgery and heart cathwith angiogram.  Clinical Impression  Pt is limited by lightheadedness in standing. Orthostatics taken, were soft, but negative for dropping.  Pt doesn't need much physical assist, but cannot yet tolerate walking due to lightheadedness.   PT to follow acutely for deficits listed below.       Follow Up Recommendations Home health PT;Supervision for mobility/OOB    Equipment Recommendations  None recommended by PT    Recommendations for Other Services   NA    Precautions / Restrictions Precautions Precautions: Fall Precaution Comments: per pt report her left knee gives out on her      Mobility  Bed Mobility Overal bed mobility: Needs Assistance Bed Mobility: Supine to Sit     Supine to sit: Modified independent (Device/Increase time);HOB elevated     General bed mobility comments: Pt able to pull up to sitting given extra time and bed rail for leverage.   Transfers Overall transfer level: Needs assistance Equipment used: 1 person hand held assist Transfers: Sit to/from [facility] Sit to Stand: Min guard Stand pivot transfers: Min guard       General transfer comment: Pt reports significant lightheadedness in standing, only min guard assist for support and balance.   Ambulation/Gait             General Gait Details: pt unable due to lightheadedness                        Pertinent Vitals/Pain Pain Assessment: Faces Faces Pain Scale: No hurt    Home Living Family/patient expects to be discharged to:: Private residence Living  Arrangements: Spouse/significant other Available Help at Discharge: Family Type of Home: House Home Access: Level entry     Home Layout: One level Home Equipment: Environmental consultant - 2 wheels;Cane - single point      Prior Function Level of Independence: Independent               Hand Dominance   Dominant Hand: Right    Extremity/Trunk Assessment   Upper Extremity Assessment: Overall WFL for tasks assessed           Lower Extremity Assessment: Generalized weakness (pt reports left knee issues, gives away at times)      Cervical / Trunk Assessment: Normal  Communication   Communication: No difficulties  Cognition Arousal/Alertness: Awake/alert Behavior During Therapy: WFL for tasks assessed/performed Overall Cognitive Status: Within Functional Limits for tasks assessed                               Assessment/Plan    PT Assessment Patient needs continued PT services  PT Diagnosis Difficulty walking;Abnormality of gait;Generalized weakness   PT Problem List Decreased strength;Decreased activity tolerance;Decreased balance;Decreased mobility;Decreased knowledge of use of DME;Cardiopulmonary status limiting activity  PT Treatment Interventions DME instruction;Gait training;Stair training;Functional mobility training;Therapeutic activities;Therapeutic exercise;Neuromuscular re-education;Balance training;Patient/family education   PT Goals (Current goals can be found in the Care Plan section) Acute Rehab PT Goals Patient Stated Goal: to get stronger and  go back home PT Goal Formulation: With patient Time For Goal Achievement: 03/23/15 Potential to Achieve Goals: Good    Frequency Min 3X/week    End of Session   Activity Tolerance: Patient limited by fatigue (limited by lightheadedness) Patient left: in chair;with call bell/phone within reach;with chair alarm set Nurse Communication: Mobility status         Time: 0354-6568 PT Time Calculation (min)  (ACUTE ONLY): 34 min   Charges:    1EV 1Gait          Gail Vendetti B. [place], [place], DPT [phone]   03/09/2015, 8:05 AM

## 2015-03-09 NOTE — Progress Notes (Signed)
ANTICOAGULATION CONSULT NOTE - Follow - up [place] for heparin Indication: atrial fibrillation  Allergies  Allergen Reactions  . Iohexol Shortness Of Breath    PT STATES CONTRAST ALLERGY CAUSED SHORTNESS OF BREATH IN THE 70'S  . Dye Fdc Red [Red Dye] Other (See Comments)    Pt.states she passed out  . Sulfa Antibiotics Itching  . Codeine Itching and Palpitations    Patient Measurements: Height: 5' (152.4 cm) Weight: 188 lb 7.9 oz (85.5 kg) IBW/kg (Calculated) : 45.5 Heparin Dosing Weight: 66 kg  Vital Signs: Temp: 97.6 F (36.4 C) (09/14 0800) Temp Source: Oral (09/14 0800) BP: 98/54 mmHg (09/14 0556) Pulse Rate: 104 (09/14 0743)  Labs:  Recent Labs  03/07/15 1210 03/07/15 2020 03/08/15 0113 03/08/15 0747 03/08/15 1145 03/09/15 0420  HGB 10.3*  --  9.5*  --   --  9.2*  HCT 34.6*  --  32.3*  --   --  31.7*  PLT 260  --  232  --   --  181  HEPARINUNFRC  --   --  0.89*  --  0.61 0.53  CREATININE 0.97  --  1.01*  --   --  1.09*  TROPONINI <0.03 <0.03 <0.03 <0.03  --   --     Estimated Creatinine Clearance: 44 mL/min (by C-G formula based on Cr of 1.09).  Assessment: 74 yo F on heparin for Afib, heparin level therapeutic CBC trending down  PMH significant for recurrent GIB. Not on anticoagulation PTA due to GI bleed.   Goal of Therapy:  Heparin level 0.3-0.7 units/ml Monitor platelets by anticoagulation protocol: Yes   Plan: - Continue heparin drip at 850 units/hr - Follow up AM labs  Thank you Anette Guarneri, PharmD [phone] 03/09/2015 9:35 AM

## 2015-03-09 NOTE — Progress Notes (Addendum)
Patient Name: Sharon Compton Date of Encounter: 03/09/2015  Primary Cardiologist: Dr Bronson Ing  Patient Profile: 74 yo female w/ PAF, no anticoag 2nd hx GIB, HTN, BMS RCA 2014, COPD, NSTEMI 01/21/2015 w/ non-obs dz, nl EF, med rx. Admitted 09/12 w/ SOB, CHF and afib RVR.   Principal Problem:   Acute on chronic diastolic CHF (congestive heart failure), NYHA class 3 Active Problems:   Obesity   Peripheral edema   CAD (coronary artery disease), native coronary artery   Depression with anxiety   A-fib   Heart failure   Bilateral edema of lower extremity   Rapid atrial fibrillation   Thyroid activity decreased   Chronic atrial fibrillation    SUBJECTIVE  Denies any CP. SOB unchanged. Complained getting blood drawn and stuck too frequently  CURRENT MEDS . ALPRAZolam  1 mg Oral QID  . antiseptic oral rinse  7 mL Mouth Rinse BID  . aspirin EC  81 mg Oral Daily  . furosemide  20 mg Intravenous BID  . ipratropium  0.5 mg Nebulization Q6H  . iron polysaccharides  150 mg Oral Daily  . levalbuterol  0.63 mg Nebulization Q6H  . levothyroxine  50 mcg Oral QODAY  . lisinopril  40 mg Oral Daily  . pantoprazole  40 mg Oral Daily  . potassium chloride  20 mEq Oral Daily  . predniSONE  40 mg Oral Q breakfast  . sodium chloride  3 mL Intravenous Q12H   . amiodarone 30 mg/hr (03/09/15 1100)  . heparin 850 Units/hr (03/09/15 1100)    OBJECTIVE  Filed Vitals:   03/09/15 0423 03/09/15 0455 03/09/15 0555 03/09/15 0556  BP: 91/72 104/65  98/54  Pulse: 71 110  139  Temp:   98 F (36.7 C)   TempSrc:   Oral   Resp:      Height:      Weight:   188 lb 7.9 oz (85.5 kg)   SpO2: 96% 92%  94%    Intake/Output Summary (Last 24 hours) at 03/09/15 0717 Last data filed at 03/09/15 0500  Gross per 24 hour  Intake 1393.37 ml  Output    825 ml  Net 568.37 ml   Filed Weights   03/07/15 1821 03/08/15 0554 03/09/15 0555  Weight: 192 lb 1.6 oz (87.136 kg) 188 lb 1.6 oz (85.322 kg) 188 lb  7.9 oz (85.5 kg)    PHYSICAL EXAM  General: Pleasant, NAD. Neuro: Alert and oriented X 3. Moves all extremities spontaneously. Psych: Normal affect. HEENT:  Normal  Neck: Supple without bruits or JVD. Lungs:  Resp regular and unlabored. Decreased breath sound in bibasilar area, with occasional expiratory wheezing, no rale.  Heart: tachycardic. no s3, s4, or murmurs. Abdomen: Soft, non-tender, non-distended, BS + x 4.  Extremities: No clubbing, cyanosis or edema. DP/PT/Radials 2+ and equal bilaterally.  Accessory Clinical Findings  CBC  Recent Labs  03/08/15 0113 03/09/15 0420  WBC 7.7 4.1  HGB 9.5* 9.2*  HCT 32.3* 31.7*  MCV 79.2 80.3  PLT 232 161   Basic Metabolic Panel  Recent Labs  03/08/15 0113 03/09/15 0420  NA 141 136  K 3.5 4.0  CL 103 97*  CO2 31 31  GLUCOSE 123* 149*  BUN 8 8  CREATININE 1.01* 1.09*  CALCIUM 8.7* 8.6*   Cardiac Enzymes  Recent Labs  03/07/15 2020 03/08/15 0113 03/08/15 0747  TROPONINI <0.03 <0.03 <0.03   Thyroid Function Tests  Recent Labs  03/07/15 2020  TSH 2.632  TELE Afib with HR 130s    ECG  No new EKG  Echocardiogram  pending    Radiology/Studies  Dg Chest Port 1 View  03/07/2015   CLINICAL DATA:  Atrial fibrillation, shortness of breath.  EXAM: PORTABLE CHEST - 1 VIEW  COMPARISON:  01/21/2015  FINDINGS: There is a moderate right pleural effusion and small left pleural effusion. Heart is borderline in size. Mild vascular congestion. Bibasilar atelectasis.  IMPRESSION: Bilateral pleural effusions, right greater than left. Bibasilar atelectasis.  Mild vascular congestion.   Electronically Signed   By: Rolm Baptise M.D.   On: 03/07/2015 13:26    ASSESSMENT AND PLAN  1. Acute on chronic diastolic HF vs bronchitis  - discharge dry weight was 181 on 01/25/2015, admission weight 188 9/12  - pending echo. Did not appreciate significant rale on exam, does have wheezing. Currently on 20mg  IV lasix BID. (not sure  if it was originally intended for 20mg  q6H)  - SBP has been low in 80-90s, will hold 40mg  lisinopril, wait for BP to drift up, add diltiazem for rate control.  2. Paroxysmal atrial fibrillation with RVR - unknown duration  - CHA2DS2-Vasc score 4 (HTN, CAD, female, age), not on systemic anticoagulation due to h/o GIB, AVM of colon is the past, recurrent cecal AVM ablated in 07/2011. Also had PUD  - Records reviewed, she has been on and taken off Cardizem in the past and her metoprolol dose has been up and down.   - was on amiodarone at d/c 10/2013 but TSH went up and it was d/c'd  - BB stopped due to active wheezing, had hypotension, therefore diltiazem stopped as well, SBP 80-90s. HR uncontrolled in 130s. Will stop her lisinopril 40mg  daily, allow BP to drift up, hopefully can add back short acting PO diltiazem. Amiodarone gtt started for rhythm control.   Addendum: called by Dr. Conley Canal regarding whether to continue IV heparin since she is not a systemic anticoagulation candidate, patient was irritated this morning with blood drawn associated with heparin level. Will discuss with MD.   3. CAD (coronary artery disease), native coronary artery - hx BMS RCA  - cath 01/24/2015 w/ non-obs dz, 50% mid LAD, 20% distal RCA, 70% small jailed RPDA lesion arrising from distal stented segment. Medical therapy.   - note from 11/03/2013, note made that she does not tolerate statins. Will let primary cardiologist address as OP   Signed, Woodward Ku Pager: [phone]   The patient was seen, examined and discussed with Almyra Deforest, PA-C and I agree with the above.   74 year old female with h/o PAF in the settings of acute bronchitis and possible non-compliance to her meds. She is activelly wheezing and in acute on chronic diastolic CHF sec to a-fib with RVR.  I will ad steroids and atrovent for wheezing, also lasix 20 mg iv Q6H, she is not able to maintain HR< 100, on amiodarone drip, hypotensive,  We will add  digoxin 0.25 mg iv x 1, then 0.125 mg po daily.  She is fluid overloaded, I will increase lasix to 20 mg iv Q6H.  She is not on anticoagulation as she has h/o GIB, rhythm control is key here.  TSH was normal.   Dorothy Spark 03/09/2015

## 2015-03-09 NOTE — Progress Notes (Signed)
Notified K Schorr NP about pt's BP 81/52. Received orders for 250 cc NS bolus. Pt asymptomatic at this time; will continue to monitor.

## 2015-03-09 NOTE — Progress Notes (Signed)
[facility] PROGRESS NOTE  Sharon Compton ZGY:[id number] DOB: 08-Nov-1940 DOA: 03/07/2015 PCP: Claretta Fraise, MD  Assessment/Plan:  Principal Problem:   Acute on chronic diastolic CHF (congestive heart failure), NYHA class 3: secondary to noncompliance, rapid AF. on IV lasix. Needs better HR control Active Problems:   Obesity   Peripheral edema   CAD (coronary artery disease), native coronary artery   Depression with anxiety   Atrial fib with RVR. Changed to amiodarone yesterday due to hypotension on cardizem.  Can stop heparin gtt from my perspective, as pt in chronic a fib, not an anticoagulation candidate due to noncompliance and h/o GIB.  Patient requests less blood draws. Discussed with Mr. Eulas Post.  Lisinopril stopped. Still tachycardic into the 130s No wheeze currently (or yesterday when I rounded). D/c prednisone  HPI/Subjective: C/o dyspnea. Various pains. Insomnia. C/o frequent blood draws  Objective: Filed Vitals:   03/09/15 0800  BP:   Pulse:   Temp: 97.6 F (36.4 C)  Resp:     Intake/Output Summary (Last 24 hours) at 03/09/15 0904 Last data filed at 03/09/15 0800  Gross per 24 hour  Intake 1273.37 ml  Output    725 ml  Net 548.37 ml   Filed Weights   03/07/15 1821 03/08/15 0554 03/09/15 0555  Weight: 87.136 kg (192 lb 1.6 oz) 85.322 kg (188 lb 1.6 oz) 85.5 kg (188 lb 7.9 oz)    Exam:   General:  Iying flat. Appears comfortable  Cardiovascular: irreg irreg. fast  Respiratory: CTA without WRR  Abdomen: obese, s, nt, nd  Ext: edema present, not really pitting.  Basic Metabolic Panel:  Recent Labs Lab 03/04/15 1641 03/07/15 1210 03/08/15 0113 03/09/15 0420  NA 143 141 141 136  K 4.6 4.0 3.5 4.0  CL 102 103 103 97*  CO2 26 30 31 31   GLUCOSE 99 116* 123* 149*  BUN 12 10 8 8   CREATININE 0.97 0.97 1.01* 1.09*  CALCIUM 9.2 9.0 8.7* 8.6*   Liver Function Tests:  Recent Labs Lab 03/04/15 1641  AST 15  ALT 15  ALKPHOS 87  BILITOT 0.3   PROT 6.3   No results for input(s): LIPASE, AMYLASE in the last 168 hours. No results for input(s): AMMONIA in the last 168 hours. CBC:  Recent Labs Lab 03/07/15 1210 03/08/15 0113 03/09/15 0420  WBC 7.6 7.7 4.1  HGB 10.3* 9.5* 9.2*  HCT 34.6* 32.3* 31.7*  MCV 79.5 79.2 80.3  PLT 260 232 181   Cardiac Enzymes:  Recent Labs Lab 03/07/15 1210 03/07/15 2020 03/08/15 0113 03/08/15 0747  TROPONINI <0.03 <0.03 <0.03 <0.03   BNP (last 3 results)  Recent Labs  01/21/15 0830 03/07/15 1210  BNP 243.0* 390.3*    ProBNP (last 3 results) No results for input(s): PROBNP in the last 8760 hours.  CBG: No results for input(s): GLUCAP in the last 168 hours.  Recent Results (from the past 240 hour(s))  MRSA PCR Screening     Status: None   Collection Time: 03/07/15 10:51 PM  Result Value Ref Range Status   MRSA by PCR NEGATIVE NEGATIVE Final    Comment:        The GeneXpert MRSA Assay (FDA approved for NASAL specimens only), is one component of a comprehensive MRSA colonization surveillance program. It is not intended to diagnose MRSA infection nor to guide or monitor treatment for MRSA infections.      Studies: Dg Chest Port 1 View  03/07/2015   CLINICAL DATA:  Atrial fibrillation,  shortness of breath.  EXAM: PORTABLE CHEST - 1 VIEW  COMPARISON:  01/21/2015  FINDINGS: There is a moderate right pleural effusion and small left pleural effusion. Heart is borderline in size. Mild vascular congestion. Bibasilar atelectasis.  IMPRESSION: Bilateral pleural effusions, right greater than left. Bibasilar atelectasis.  Mild vascular congestion.   Electronically Signed   By: Rolm Baptise M.D.   On: 03/07/2015 13:26    Scheduled Meds: . ALPRAZolam  1 mg Oral QID  . antiseptic oral rinse  7 mL Mouth Rinse BID  . aspirin EC  81 mg Oral Daily  . furosemide  20 mg Intravenous BID  . ipratropium  0.5 mg Nebulization Q6H  . iron polysaccharides  150 mg Oral Daily  . levalbuterol   0.63 mg Nebulization Q6H  . levothyroxine  50 mcg Oral QODAY  . pantoprazole  40 mg Oral Daily  . potassium chloride  20 mEq Oral Daily  . predniSONE  40 mg Oral Q breakfast  . sodium chloride  3 mL Intravenous Q12H   Continuous Infusions: . amiodarone 30 mg/hr (03/09/15 0850)  . heparin 850 Units/hr (03/09/15 0800)    Time spent: 25 minutes  [facility] [email or web address], password [facility] 03/09/2015, 9:04 AM  LOS: 2 days

## 2015-03-09 NOTE — Progress Notes (Signed)
Patient BP 82/73, HR 128. Called and spoke with Dr. Conley Canal, No new orders were obtained, was asked to monitor and call if patient becomes symptomatic or BP drops further. Will continue to monitor.

## 2015-03-09 NOTE — Progress Notes (Signed)
PT Cancellation Note  Patient Details Name: KAVITA BARTL MRN: [id number] DOB: 09/26/1940   Cancelled Treatment:    Reason Eval/Treat Not Completed: Medical issues which prohibited therapy.  Per RN pt having HR issues.  PT will follow acutely tomorrow.     Barbarann Ehlers [place], [place], DPT [phone]   03/09/2015, 1:37 PM

## 2015-03-10 ENCOUNTER — Inpatient Hospital Stay (HOSPITAL_COMMUNITY): Payer: Medicare Other

## 2015-03-10 ENCOUNTER — Other Ambulatory Visit: Payer: Self-pay | Admitting: Family Medicine

## 2015-03-10 DIAGNOSIS — R0600 Dyspnea, unspecified: Secondary | ICD-10-CM

## 2015-03-10 DIAGNOSIS — I25118 Atherosclerotic heart disease of native coronary artery with other forms of angina pectoris: Secondary | ICD-10-CM

## 2015-03-10 LAB — BASIC METABOLIC PANEL
ANION GAP: 7 (ref 5–15)
BUN: 12 mg/dL (ref 6–20)
CALCIUM: 8.5 mg/dL — AB (ref 8.9–10.3)
CO2: 36 mmol/L — ABNORMAL HIGH (ref 22–32)
Chloride: 95 mmol/L — ABNORMAL LOW (ref 101–111)
Creatinine, Ser: 1.18 mg/dL — ABNORMAL HIGH (ref 0.44–1.00)
GFR, EST AFRICAN AMERICAN: 51 mL/min — AB (ref >60)
GFR, EST NON AFRICAN AMERICAN: 44 mL/min — AB (ref >60)
Glucose, Bld: 133 mg/dL — ABNORMAL HIGH (ref 65–99)
Potassium: 4.1 mmol/L (ref 3.5–5.1)
Sodium: 138 mmol/L (ref 135–145)

## 2015-03-10 LAB — CBC
HCT: 32.5 % — ABNORMAL LOW (ref 36.0–46.0)
Hemoglobin: 9.2 g/dL — ABNORMAL LOW (ref 12.0–15.0)
MCH: 22.8 pg — ABNORMAL LOW (ref 26.0–34.0)
MCHC: 28.3 g/dL — ABNORMAL LOW (ref 30.0–36.0)
MCV: 80.4 fL (ref 78.0–100.0)
PLATELETS: 188 10*3/uL (ref 150–400)
RBC: 4.04 MIL/uL (ref 3.87–5.11)
RDW: 16.9 % — ABNORMAL HIGH (ref 11.5–15.5)
WBC: 6.1 10*3/uL (ref 4.0–10.5)

## 2015-03-10 LAB — HEPARIN LEVEL (UNFRACTIONATED): HEPARIN UNFRACTIONATED: 0.65 [IU]/mL (ref 0.30–0.70)

## 2015-03-10 MED ORDER — LEVOFLOXACIN 750 MG PO TABS: 750.0000 mg | ORAL_TABLET | ORAL | Status: DC

## 2015-03-10 MED ORDER — LEVOFLOXACIN IN D5W 750 MG/150ML IV SOLN
750.0000 mg | Freq: Once | INTRAVENOUS | Status: DC
  Filled 2015-03-10: qty 150

## 2015-03-10 MED ORDER — DILTIAZEM HCL 30 MG PO TABS
30.0000 mg | ORAL_TABLET | Freq: Four times a day (QID) | ORAL | Status: DC
  Administered 2015-03-10 – 2015-03-11 (×4): 30 mg via ORAL
  Filled 2015-03-10 (×5): qty 1

## 2015-03-10 MED ORDER — LEVOFLOXACIN 750 MG PO TABS
750.0000 mg | ORAL_TABLET | ORAL | Status: DC
  Administered 2015-03-10 – 2015-03-14 (×3): 750 mg via ORAL
  Filled 2015-03-10 (×4): qty 1

## 2015-03-10 NOTE — Progress Notes (Signed)
[facility] PROGRESS NOTE  Sharon Compton DXI:[id number] DOB: 08-07-1940 DOA: 03/07/2015 PCP: Claretta Fraise, MD  Assessment/Plan:    Acute on chronic diastolic CHF (congestive heart failure), NYHA class 3: secondary to noncompliance, rapid AF. on lasix IV. Needs better HR control -down 2.5L so far    Obesity   Peripheral edema   CAD (coronary artery disease), native coronary artery   Depression with anxiety   Atrial fib with RVR. Changed to amiodarone due to hypotension on cardizem.   Lisinopril stopped. Still tachycardic -digoxin Fibromyalgia  HPI/Subjective: Says she is very tired  Objective: Filed Vitals:   03/10/15 0942  BP:   Pulse: 122  Temp:   Resp: 23    Intake/Output Summary (Last 24 hours) at 03/10/15 1037 Last data filed at 03/10/15 0322  Gross per 24 hour  Intake 1070.43 ml  Output   2650 ml  Net -1579.57 ml   Filed Weights   03/08/15 0554 03/09/15 0555 03/10/15 0413  Weight: 85.322 kg (188 lb 1.6 oz) 85.5 kg (188 lb 7.9 oz) 84.96 kg (187 lb 4.9 oz)    Exam:   General:   Appears comfortable  Cardiovascular: irreg irreg. fast  Respiratory: CTA without WRR  Abdomen: obese, s, nt, nd  Ext: edema present, not really pitting.  Basic Metabolic Panel:  Recent Labs Lab 03/04/15 1641 03/07/15 1210 03/08/15 0113 03/09/15 0420 03/10/15 0316  NA 143 141 141 136 138  K 4.6 4.0 3.5 4.0 4.1  CL 102 103 103 97* 95*  CO2 26 30 31 31  36*  GLUCOSE 99 116* 123* 149* 133*  BUN 12 10 8 8 12   CREATININE 0.97 0.97 1.01* 1.09* 1.18*  CALCIUM 9.2 9.0 8.7* 8.6* 8.5*   Liver Function Tests:  Recent Labs Lab 03/04/15 1641  AST 15  ALT 15  ALKPHOS 87  BILITOT 0.3  PROT 6.3   No results for input(s): LIPASE, AMYLASE in the last 168 hours. No results for input(s): AMMONIA in the last 168 hours. CBC:  Recent Labs Lab 03/07/15 1210 03/08/15 0113 03/09/15 0420 03/10/15 0316  WBC 7.6 7.7 4.1 6.1  HGB 10.3* 9.5* 9.2* 9.2*  HCT 34.6* 32.3*  31.7* 32.5*  MCV 79.5 79.2 80.3 80.4  PLT 260 232 181 188   Cardiac Enzymes:  Recent Labs Lab 03/07/15 1210 03/07/15 2020 03/08/15 0113 03/08/15 0747  TROPONINI <0.03 <0.03 <0.03 <0.03   BNP (last 3 results)  Recent Labs  01/21/15 0830 03/07/15 1210  BNP 243.0* 390.3*    ProBNP (last 3 results) No results for input(s): PROBNP in the last 8760 hours.  CBG: No results for input(s): GLUCAP in the last 168 hours.  Recent Results (from the past 240 hour(s))  MRSA PCR Screening     Status: None   Collection Time: 03/07/15 10:51 PM  Result Value Ref Range Status   MRSA by PCR NEGATIVE NEGATIVE Final    Comment:        The GeneXpert MRSA Assay (FDA approved for NASAL specimens only), is one component of a comprehensive MRSA colonization surveillance program. It is not intended to diagnose MRSA infection nor to guide or monitor treatment for MRSA infections.      Studies: No results found.  Scheduled Meds: . ALPRAZolam  1 mg Oral QID  . antiseptic oral rinse  7 mL Mouth Rinse BID  . aspirin EC  81 mg Oral Daily  . digoxin  0.125 mg Oral Daily  . furosemide  20 mg Intravenous Q6H  .  ipratropium  0.5 mg Nebulization Q6H  . iron polysaccharides  150 mg Oral Daily  . levalbuterol  0.63 mg Nebulization TID  . levothyroxine  50 mcg Oral QODAY  . pantoprazole  40 mg Oral Daily  . potassium chloride  20 mEq Oral Daily  . sodium chloride  3 mL Intravenous Q12H   Continuous Infusions: . amiodarone 30 mg/hr (03/10/15 0811)  . heparin 850 Units/hr (03/10/15 0041)    Time spent: 25 minutes  Bertram Haddix  [facility] [email or web address], password [facility] 03/10/2015, 10:37 AM  LOS: 3 days

## 2015-03-10 NOTE — Progress Notes (Signed)
Patient Profile: 74 yo female w/ PAF, no anticoag 2nd hx GIB, HTN, BMS to RCA in 2014, COPD, NSTEMI 01/21/2015 w/ non-obs dz, nl EF med rx. Admitted 09/12 w/ SOB, CHF and afib RVR.  Subjective: Still feels very tired and weak. No chest pain. Breathing is better. Still coughing up yellow mucus.   Objective: Vital signs in last 24 hours: Temp:  [97.6 F (36.4 C)-98.3 F (36.8 C)] 98.2 F (36.8 C) (09/15 0802) Pulse Rate:  [26-160] 26 (09/15 1156) Resp:  [19-23] 20 (09/15 1156) BP: (82-115)/(47-79) 93/62 mmHg (09/15 1156) SpO2:  [95 %-100 %] 98 % (09/15 1156) Weight:  [187 lb 4.9 oz (84.96 kg)] 187 lb 4.9 oz (84.96 kg) (09/15 0413) Last BM Date: 03/06/15  Intake/Output from previous day: 09/14 0701 - 09/15 0700 In: 1430.4 [P.O.:1220; I.V.:210.4] Out: 2650 [Urine:2650] Intake/Output this shift:   Medications . ALPRAZolam  1 mg Oral QID  . antiseptic oral rinse  7 mL Mouth Rinse BID  . aspirin EC  81 mg Oral Daily  . digoxin  0.125 mg Oral Daily  . diltiazem  30 mg Oral 4 times per day  . furosemide  20 mg Intravenous Q6H  . ipratropium  0.5 mg Nebulization Q6H  . iron polysaccharides  150 mg Oral Daily  . levalbuterol  0.63 mg Nebulization TID  . levothyroxine  50 mcg Oral QODAY  . pantoprazole  40 mg Oral Daily  . potassium chloride  20 mEq Oral Daily  . sodium chloride  3 mL Intravenous Q12H   . amiodarone 30 mg/hr (03/10/15 0811)  . heparin 850 Units/hr (03/10/15 0041)   PE: General appearance: alert, cooperative, no distress and moderately obese Neck: no carotid bruit and no JVD Lungs: bilateral diffuse expiratory rhonchi Heart: irregularly irregular rhythm and tachy rate Extremities: edematous bilateral LEE Pulses: 2+ and symmetric Skin: warm and dry Neurologic: Grossly normal  Lab Results:   Recent Labs  03/08/15 0113 03/09/15 0420 03/10/15 0316  WBC 7.7 4.1 6.1  HGB 9.5* 9.2* 9.2*  HCT 32.3* 31.7* 32.5*  PLT 232 181 188   BMET  Recent Labs  03/08/15 0113 03/09/15 0420 03/10/15 0316  NA 141 136 138  K 3.5 4.0 4.1  CL 103 97* 95*  CO2 31 31 36*  GLUCOSE 123* 149* 133*  BUN 8 8 12   CREATININE 1.01* 1.09* 1.18*  CALCIUM 8.7* 8.6* 8.5*    Assessment/Plan  Principal Problem:   Acute on chronic diastolic CHF (congestive heart failure), NYHA class 3 Active Problems:   Obesity   Peripheral edema   CAD (coronary artery disease), native coronary artery   Depression with anxiety   A-fib   Heart failure   Bilateral edema of lower extremity   Rapid atrial fibrillation   Thyroid activity decreased   Chronic atrial fibrillation   Acute diastolic CHF (congestive heart failure), NYHA class 3  1. Acute on Chronic Diastolic CHF  - EF 36-14% on echo. Moderate LVH noted. Good diuresis thus far. -2.65 L out in past 24 hrs. Weight is still above previous discharge weight. Dry weight is 181. Weight today is 187. LEE are edematous. Renal function is ok. Continue IV lasix, strict I/Os and daily weights. Monitor renal function and electrolytes.    2. PAF w/ RVR: rate is still poorly controlled, fluctuating between the 120s-150s. BP is soft in the low 90s. She is symptomatic with fatigue/weakness. She is currently on IV amiodarone, PO Cardizem Q6H and digoxin. Further titration of  meds is limited by borderline hypotension.   3. CAD: - h/o BMS to RCA in 2014 - cath 01/24/2015 w/ non-obs dz, 50% mid LAD, 20% distal RCA, 70% small jailed RPDA lesion arrising from distal stented segment. Medical therapy elected.  - note from 11/03/2013, note made that she does not tolerate statins. Will let primary cardiologist address as OP. Continue ASA.   4. ? Bronchitis: received prenisone. Continue supportive care.    LOS: 3 days   Brittainy M. Ladoris Gene 03/10/2015 12:35 PM  The patient was seen, examined and discussed with Dineen Kid, PA-C and I agree with the above.    74 year old female with h/o PAF in the  settings of acute bronchitis and possible non-compliance to her meds. She is activelly wheezing and in acute on chronic diastolic CHF sec to a-fib with RVR.  I is on steroids and atrovent for wheezing, I will add antibiotics for mucopurulent bonchitis. On lasix 20 mg iv Q6H for fluid overload.  She is not able to maintain HR< 100, on amiodarone drip, and digoxin and oral diltiazem, still hypotensive,  She is not on anticoagulation as she has h/o GIB, rhythm control is key here.  TSH was normal.   Dorothy Spark 03/10/2015

## 2015-03-10 NOTE — Progress Notes (Signed)
  Echocardiogram 2D Echocardiogram has been performed.  Sharon Compton 03/10/2015, 11:46 AM

## 2015-03-10 NOTE — Progress Notes (Signed)
PT Cancellation Note  Patient Details Name: FATEMA RABE MRN: [id number] DOB: 14-Aug-1940   Cancelled Treatment:    Reason Eval/Treat Not Completed: Medical issues which prohibited therapy.  Pt's HR is currently 166.  RN aware and PT holding tx until HR is better controlled.  Will follow up tomorrow. Thanks,   Barbarann Ehlers. [place], [place], DPT [phone]   03/10/2015, 5:58 PM

## 2015-03-10 NOTE — Progress Notes (Addendum)
ANTICOAGULATION CONSULT NOTE - Follow - up [place] for heparin Indication: atrial fibrillation  Allergies  Allergen Reactions  . Iohexol Shortness Of Breath    PT STATES CONTRAST ALLERGY CAUSED SHORTNESS OF BREATH IN THE 70'S  . Dye Fdc Red [Red Dye] Other (See Comments)    Pt.states she passed out  . Sulfa Antibiotics Itching  . Codeine Itching and Palpitations    Patient Measurements: Height: 5' (152.4 cm) Weight: 187 lb 4.9 oz (84.96 kg) IBW/kg (Calculated) : 45.5 Heparin Dosing Weight: 66 kg  Vital Signs: Temp: 98.2 F (36.8 C) (09/15 0802) Temp Source: Oral (09/15 0802) BP: 104/79 mmHg (09/15 0800) Pulse Rate: 122 (09/15 0942)  Labs:  Recent Labs  03/07/15 2020  03/08/15 0113 03/08/15 0747 03/08/15 1145 03/09/15 0420 03/10/15 0316  HGB  --   --  9.5*  --   --  9.2* 9.2*  HCT  --   --  32.3*  --   --  31.7* 32.5*  PLT  --   --  232  --   --  181 188  HEPARINUNFRC  --   < > 0.89*  --  0.61 0.53 0.65  CREATININE  --   --  1.01*  --   --  1.09* 1.18*  TROPONINI <0.03  --  <0.03 <0.03  --   --   --   < > = values in this interval not displayed.  Estimated Creatinine Clearance: 40.5 mL/min (by C-G formula based on Cr of 1.18).  Assessment: 74 yo F on heparin for Afib, heparin level therapeutic CBC trending down  PMH significant for recurrent GIB. Not on anticoagulation PTA due to GI bleed.   Goal of Therapy:  Heparin level 0.3-0.7 units/ml Monitor platelets by anticoagulation protocol: Yes   Plan: - Continue heparin drip at 850 units/hr - Follow up AM labs  Thank you Anette Guarneri, PharmD [phone] 03/10/2015 10:00 AM   Added Levaquin 750 mg iv/po this afternoon for CAP  Thank you. Anette Guarneri, PharmD [phone]

## 2015-03-10 NOTE — Care Management Important Message (Signed)
Important Message  Patient Details  Name: Sharon Compton MRN: [id number] Date of Birth: 04-09-1941   Medicare Important Message Given:  Yes-second notification given    Nathen May 03/10/2015, 3:47 PM

## 2015-03-11 LAB — BASIC METABOLIC PANEL
Anion gap: 10 (ref 5–15)
BUN: 13 mg/dL (ref 6–20)
CALCIUM: 8.7 mg/dL — AB (ref 8.9–10.3)
CO2: 36 mmol/L — ABNORMAL HIGH (ref 22–32)
CREATININE: 1.33 mg/dL — AB (ref 0.44–1.00)
Chloride: 93 mmol/L — ABNORMAL LOW (ref 101–111)
GFR calc non Af Amer: 38 mL/min — ABNORMAL LOW (ref >60)
GFR, EST AFRICAN AMERICAN: 44 mL/min — AB (ref >60)
Glucose, Bld: 87 mg/dL (ref 65–99)
Potassium: 4.4 mmol/L (ref 3.5–5.1)
SODIUM: 139 mmol/L (ref 135–145)

## 2015-03-11 LAB — HEPARIN LEVEL (UNFRACTIONATED)
HEPARIN UNFRACTIONATED: 1.86 [IU]/mL — AB (ref 0.30–0.70)
Heparin Unfractionated: 0.54 IU/mL (ref 0.30–0.70)

## 2015-03-11 LAB — CBC
HCT: 32 % — ABNORMAL LOW (ref 36.0–46.0)
Hemoglobin: 9.5 g/dL — ABNORMAL LOW (ref 12.0–15.0)
MCH: 23.9 pg — ABNORMAL LOW (ref 26.0–34.0)
MCHC: 29.7 g/dL — ABNORMAL LOW (ref 30.0–36.0)
MCV: 80.6 fL (ref 78.0–100.0)
PLATELETS: 182 10*3/uL (ref 150–400)
RBC: 3.97 MIL/uL (ref 3.87–5.11)
RDW: 17 % — AB (ref 11.5–15.5)
WBC: 6.6 10*3/uL (ref 4.0–10.5)

## 2015-03-11 MED ORDER — FUROSEMIDE 10 MG/ML IJ SOLN: 20.0000 mg | Freq: Two times a day (BID) | INTRAMUSCULAR | Status: DC

## 2015-03-11 MED ORDER — DILTIAZEM HCL 60 MG PO TABS
60.0000 mg | ORAL_TABLET | Freq: Four times a day (QID) | ORAL | Status: DC
  Administered 2015-03-11 – 2015-03-12 (×3): 60 mg via ORAL
  Filled 2015-03-11 (×3): qty 1

## 2015-03-11 MED ORDER — POLYETHYLENE GLYCOL 3350 17 G PO PACK
17.0000 g | PACK | Freq: Every day | ORAL | Status: DC
  Administered 2015-03-11 – 2015-03-15 (×5): 17 g via ORAL
  Filled 2015-03-11 (×5): qty 1

## 2015-03-11 NOTE — Progress Notes (Signed)
Patient Name: Sharon Compton Date of Encounter: 03/11/2015  Principal Problem:   Acute on chronic diastolic CHF (congestive heart failure), NYHA class 3 Active Problems:   Obesity   Peripheral edema   CAD (coronary artery disease), native coronary artery   Depression with anxiety   A-fib   Heart failure   Bilateral edema of lower extremity   Rapid atrial fibrillation   Thyroid activity decreased   Chronic atrial fibrillation   Acute diastolic CHF (congestive heart failure), NYHA class 3   Primary Cardiologist: Dr. Bronson Ing Patient Profile: 74 yo female w/ PMH of PAF (not on anticoagulation 2ry to hx of GIB), HTN, CAD (BMS to RCA 08/2012, admitted 01/21/2015 for NSTEMI with nonobstructive disease at that time), COPD, Fibromyalgia, and Anxiety admitted 03/07/2015 for atrial fibrillation with RVR.  SUBJECTIVE: Reports still feeling short of breath and weak. Has been having a productive cough with yellow phlegm. Denies any chest pain or palpitations.  OBJECTIVE Filed Vitals:   03/11/15 0605 03/11/15 0620 03/11/15 0625 03/11/15 0845  BP: 101/66   124/93  Pulse: 98   129  Temp:    98.9 F (37.2 C)  TempSrc:    Oral  Resp: 18   19  Height:      Weight:   181 lb (82.101 kg)   SpO2: 99% 96%  97%    Intake/Output Summary (Last 24 hours) at 03/11/15 0859 Last data filed at 03/11/15 0851  Gross per 24 hour  Intake 901.83 ml  Output   4975 ml  Net -4073.17 ml   Filed Weights   03/10/15 0413 03/11/15 0400 03/11/15 0625  Weight: 187 lb 4.9 oz (84.96 kg) 185 lb 3.2 oz (84.006 kg) 181 lb (82.101 kg)    PHYSICAL EXAM General: Well developed, well nourished, female in no acute distress. Head: Normocephalic, atraumatic.  Neck: Supple without bruits, JVD not elevated. Lungs:  Resp regular and unlabored, Wheezing present throughout lung fields.  Heart: Irregularly irregular with rate in 120's - 150's, no S3, S4, or murmur; no rub. Abdomen: Soft, non-tender, non-distended with  normoactive bowel sounds. No hepatomegaly. No rebound/guarding. No obvious abdominal masses. Extremities: No clubbing or cyanosis. edema. Distal pedal pulses are 2+ bilaterally. Neuro: Alert and oriented X 3. Moves all extremities spontaneously. Psych: Normal affect.    LABS: CBC: Recent Labs  03/10/15 0316 03/11/15 0348  WBC 6.1 6.6  HGB 9.2* 9.5*  HCT 32.5* 32.0*  MCV 80.4 80.6  PLT 188 761   Basic Metabolic Panel: Recent Labs  03/10/15 0316 03/11/15 0348  NA 138 139  K 4.1 4.4  CL 95* 93*  CO2 36* 36*  GLUCOSE 133* 87  BUN 12 13  CREATININE 1.18* 1.33*  CALCIUM 8.5* 8.7*   BNP:  B NATRIURETIC PEPTIDE  Date/Time Value Ref Range Status  03/07/2015 12:10 PM 390.3* 0.0 - 100.0 pg/mL Final  01/21/2015 08:30 AM 243.0* 0.0 - 100.0 pg/mL Final   TELE:   Trial Fibrillation with rates in 120's - 150's.       ECHO: 03/10/2015 Study Conclusions - Procedure narrative: Transthoracic echocardiography. Image quality was poor. The study was technically difficult, as a result of poor sound wave transmission and body habitus. - Left ventricle: The cavity size was normal. Wall thickness was increased in a pattern of moderate LVH. Systolic function was normal. The estimated ejection fraction was in the range of 55% to 60%. Images were inadequate for LV wall motion assessment. The study is not technically sufficient  to allow evaluation of LV diastolic function. - Mitral valve: Mildly thickened leaflets . There was trivial regurgitation. - Left atrium: Mildly dilated at 38 ml/m2. - Right atrium: The atrium was mildly dilated. - Tricuspid valve: There was trivial regurgitation. - Pulmonary arteries: PA peak pressure: 19 mm Hg (S). - Inferior vena cava: The vessel was normal in size. The respirophasic diameter changes were in the normal range (>= 50%), consistent with normal central venous pressure. - Pericardium, extracardiac: A trivial pericardial effusion  was identified. Features were not consistent with tamponade physiology. There was a left pleural effusion.  Impressions: - Technically difficult study. LVEF 55-60%, moderate LVH, mild LAE, trivial TR, normal RVSP, trivial pericardial effusion without tamponade features, left pleural effusion.   Current Medications:  . ALPRAZolam  1 mg Oral QID  . antiseptic oral rinse  7 mL Mouth Rinse BID  . aspirin EC  81 mg Oral Daily  . digoxin  0.125 mg Oral Daily  . diltiazem  30 mg Oral 4 times per day  . furosemide  20 mg Intravenous Q6H  . ipratropium  0.5 mg Nebulization Q6H  . iron polysaccharides  150 mg Oral Daily  . levalbuterol  0.63 mg Nebulization TID  . levofloxacin  750 mg Oral Q48H  . levothyroxine  50 mcg Oral QODAY  . pantoprazole  40 mg Oral Daily  . potassium chloride  20 mEq Oral Daily  . sodium chloride  3 mL Intravenous Q12H   . amiodarone 30 mg/hr (03/10/15 2001)  . heparin 850 Units/hr (03/11/15 0840)    ASSESSMENT AND PLAN:  1. Afib with RVR - CHADSVASc score of at least 5 (age, sex, CHF, HTN, vascular disease). Not on anticoagulation due to Hx of GIB. - likely exacerbation due to not taking rate controlled medication secondary to dizziness and headaches.  - BB stopped due to active wheezing and hypotension - Rate is still in the 120's - 150's while receiving Digoxin 0.125mg  daily, Diltiazem 30mg  daily, and amiodarone drip. However, the patient remains hypotensive at times with BP between 88/47 - 124/93 in the past 24 hours so it will be difficult to titrate her medications.  - Not a candidate for cardioversion due to not being a candidate for anticoagulation 2ry to hx of GI bleeds and PUD.  2. Acute on chronic diastolic CHF (congestive heart failure), NYHA class 3 - dry weight around 180lbs. Weight on admission was 188lbs and she is now 181lbs. -4.3L yesterday, with a total of -6.5L since admission.  Has been receiving Lasix 20mg  IV Q6H. Appears to be  close to her dry weight, so will decrease IV Lasix from Q6H to Lasix 20mg  BID for now. Potential switch to PO tomorrow. - Strict I&O and daily weight.  - Continue ASA and and lisinopril.   3. CAD (coronary artery disease), native coronary artery - cath 01/24/2015 w/ non-obs dz, 50% mid LAD, 20% distal RCA, 70% small jailed RPDA lesion arrising from distal stented segment. Continued medical therapy recommended at that time.  4. Acute Bronchitis with history of COPD - On Levaquin and Xopenex nebulizer - per [facility], Erma Heritage , PA-C 8:59 AM 03/11/2015 Pager: [phone]  The patient was seen, examined and discussed with Dineen Kid, PA-C and I agree with the above.    74 year old female with h/o PAF in the settings of acute bronchitis and possible non-compliance to her meds. She was admitted with an acute bronchitis, activelly wheezing and in acute on  chronic diastolic CHF sec to a-fib with RVR.  Started on steroids and atrovent for wheezing, antibiotics added yesterday for mucopurulent bonchitis. On lasix 20 mg iv Q6H for fluid overload, we will switch to PO, now almost at dry weight.  She is not able to maintain HR< 100, on amiodarone drip, and digoxin and oral diltiazem, we will increase diltiazem dose to 60 mg PO Q6H.  She is not on anticoagulation as she has h/o GIB, rhythm control is key here.  TSH was normal.   Dorothy Spark 03/11/2015

## 2015-03-11 NOTE — Progress Notes (Addendum)
Assumed care from off RN @ 0710; patient alert & oriented; patient sitting high fowler's in bed with no signs of acute distress; c/o's weakness; amiodarone @ 16.7 cc infusion; Heparin @ 8.5 infusion; bed in lowest position and call bell in reach  @1323  Card PA notified about patient HR

## 2015-03-11 NOTE — Progress Notes (Signed)
Physical Therapy Treatment Patient Details Name: Sharon Compton MRN: [id number] DOB: [birth year] Today's Date: 03/11/2015    History of Present Illness 74 yo F admitted to [facility] on 9/12 for worsening SOB and wheezing over the last month, chest pain, orthopnea, and LE edema. Diagnosed with acute chronic diastolic CHF, class 3. Pt has a PMH of A-fib, COPD, HTN and a surgical history of knee surgery and heart cathwith angiogram.    PT Comments    Both MD, RN, and pt/PT agreed that pt needs to at least get up OOB to chair today despite high HRs.  She is at risk for getting too deconditioned to go home.  Her preference is still home with home therapy with her husband's min assist.  PT will continue to follow acutely and mobilize as able.   Follow Up Recommendations  Home health PT;Supervision for mobility/OOB     Equipment Recommendations  None recommended by PT    Recommendations for Other Services   NA     Precautions / Restrictions Precautions Precautions: Fall Precaution Comments: per pt report her left knee gives out on her Restrictions Weight Bearing Restrictions: No    Mobility  Bed Mobility Overal bed mobility: Needs Assistance Bed Mobility: Supine to Sit     Supine to sit: Modified independent (Device/Increase time)     General bed mobility comments: Pt HOB elevated and using railing for leverage  Transfers Overall transfer level: Needs assistance Equipment used: 1 person hand held assist Transfers: Sit to/from Stand Sit to Stand: Min assist Stand pivot transfers: Min assist       General transfer comment: Min hand held assist to support pt for balance during transitions and transfers.  Pt continues to feel lightheaded in sitting.  O2 sats stable on 1 L O2 [place] during transfer with no significant signs of DOE.  Productive cough throughout.  HR max observed was in the 150s.    Ambulation/Gait             General Gait Details: continue to hold due to  uncontrolled HR.           Balance Overall balance assessment: Needs assistance Sitting-balance support: Feet supported;Bilateral upper extremity supported Sitting balance-Leahy Scale: Fair     Standing balance support: Single extremity supported Standing balance-Leahy Scale: Poor                      Cognition Arousal/Alertness: Awake/alert Behavior During Therapy: WFL for tasks assessed/performed Overall Cognitive Status: Within Functional Limits for tasks assessed                      Exercises General Exercises - Lower Extremity Ankle Circles/Pumps: AROM;Both;10 reps;Supine Quad Sets: AROM;Both;10 reps;Supine Gluteal Sets: AROM;Both;10 reps;Supine        Pertinent Vitals/Pain Pain Assessment: No/denies pain           PT Goals (current goals can now be found in the care plan section) Acute Rehab PT Goals Patient Stated Goal: to get stronger and go back home Progress towards PT goals: Not progressing toward goals - comment (limited by high HRs during mobility and at rest)    Frequency  Min 3X/week    PT Plan Current plan remains appropriate       End of Session Equipment Utilized During Treatment: Oxygen Activity Tolerance: Patient limited by fatigue;Treatment limited secondary to medical complications (Comment) (high HR) Patient left: in chair;with call bell/phone within reach;with chair alarm set  Time: 5643-3295 PT Time Calculation (min) (ACUTE ONLY): 33 min  Charges:  $Therapeutic Activity: 23-37 mins                      Rebecca B. [place], [place], DPT [phone]   03/11/2015, 12:17 PM

## 2015-03-11 NOTE — Progress Notes (Signed)
ANTICOAGULATION CONSULT NOTE - Follow - up [place] for heparin Indication: atrial fibrillation  Allergies  Allergen Reactions  . Iohexol Shortness Of Breath    PT STATES CONTRAST ALLERGY CAUSED SHORTNESS OF BREATH IN THE 70'S  . Dye Fdc Red [Red Dye] Other (See Comments)    Pt.states she passed out  . Sulfa Antibiotics Itching  . Codeine Itching and Palpitations    Patient Measurements: Height: 5' (152.4 cm) Weight: 181 lb (82.101 kg) IBW/kg (Calculated) : 45.5 Heparin Dosing Weight: 66 kg  Vital Signs: Temp: 98.9 F (37.2 C) (09/16 0845) Temp Source: Oral (09/16 0845) BP: 119/98 mmHg (09/16 1030) Pulse Rate: 129 (09/16 1030)  Labs:  Recent Labs  03/09/15 0420 03/10/15 0316 03/11/15 0348 03/11/15 0930  HGB 9.2* 9.2* 9.5*  --   HCT 31.7* 32.5* 32.0*  --   PLT 181 188 182  --   HEPARINUNFRC 0.53 0.65 1.86* 0.54  CREATININE 1.09* 1.18* 1.33*  --     Estimated Creatinine Clearance: 35.2 mL/min (by C-G formula based on Cr of 1.33).  Assessment: 74 yo F on heparin for Afib, heparin level therapeutic CBC stable  PMH significant for recurrent GIB. Not on anticoagulation PTA due to GI bleed.   Goal of Therapy:  Heparin level 0.3-0.7 units/ml Monitor platelets by anticoagulation protocol: Yes   Plan: Continue heparin drip at 850 units/hr Follow up AM labs  Thank you Anette Guarneri, PharmD [phone] 03/11/2015 10:49 AM

## 2015-03-11 NOTE — Progress Notes (Signed)
[facility] PROGRESS NOTE  Sharon Compton [id number] DOB: 03-12-1941 DOA: 03/07/2015 PCP: Claretta Fraise, MD  Assessment/Plan:    Acute on chronic diastolic CHF (congestive heart failure), NYHA class 3: secondary to noncompliance, rapid AF. on lasix IV. Needs better HR control -down 6.5L so far CR trending up -heparin gtt    Obesity   Peripheral edema   CAD (coronary artery disease), native coronary artery   Depression with anxiety   Atrial fib with RVR. Changed to amiodarone due to hypotension on cardizem.   Lisinopril stopped. Still tachycardic -digoxin Fibromyalgia  PT eval recommends home health- ordered  HPI/Subjective: Says she almost "fell out" when she was gotten up to be weighed this AM -speaking to her husband on the phone with no SOB  Objective: Filed Vitals:   03/11/15 0845  BP: 124/93  Pulse: 129  Temp: 98.9 F (37.2 C)  Resp: 19    Intake/Output Summary (Last 24 hours) at 03/11/15 0954 Last data filed at 03/11/15 0851  Gross per 24 hour  Intake 901.83 ml  Output   4975 ml  Net -4073.17 ml   Filed Weights   03/10/15 0413 03/11/15 0400 03/11/15 0625  Weight: 84.96 kg (187 lb 4.9 oz) 84.006 kg (185 lb 3.2 oz) 82.101 kg (181 lb)    Exam:   General:   Appears comfortable  Cardiovascular: irreg irreg. fast  Respiratory: CTA without WRR  Abdomen: obese, s, nt, nd  Ext: edema present, not really pitting.  Basic Metabolic Panel:  Recent Labs Lab 03/07/15 1210 03/08/15 0113 03/09/15 0420 03/10/15 0316 03/11/15 0348  NA 141 141 136 138 139  K 4.0 3.5 4.0 4.1 4.4  CL 103 103 97* 95* 93*  CO2 30 31 31  36* 36*  GLUCOSE 116* 123* 149* 133* 87  BUN 10 8 8 12 13   CREATININE 0.97 1.01* 1.09* 1.18* 1.33*  CALCIUM 9.0 8.7* 8.6* 8.5* 8.7*   Liver Function Tests:  Recent Labs Lab 03/04/15 1641  AST 15  ALT 15  ALKPHOS 87  BILITOT 0.3  PROT 6.3   No results for input(s): LIPASE, AMYLASE in the last 168 hours. No results for  input(s): AMMONIA in the last 168 hours. CBC:  Recent Labs Lab 03/07/15 1210 03/08/15 0113 03/09/15 0420 03/10/15 0316 03/11/15 0348  WBC 7.6 7.7 4.1 6.1 6.6  HGB 10.3* 9.5* 9.2* 9.2* 9.5*  HCT 34.6* 32.3* 31.7* 32.5* 32.0*  MCV 79.5 79.2 80.3 80.4 80.6  PLT 260 232 181 188 182   Cardiac Enzymes:  Recent Labs Lab 03/07/15 1210 03/07/15 2020 03/08/15 0113 03/08/15 0747  TROPONINI <0.03 <0.03 <0.03 <0.03   BNP (last 3 results)  Recent Labs  01/21/15 0830 03/07/15 1210  BNP 243.0* 390.3*    ProBNP (last 3 results) No results for input(s): PROBNP in the last 8760 hours.  CBG: No results for input(s): GLUCAP in the last 168 hours.  Recent Results (from the past 240 hour(s))  MRSA PCR Screening     Status: None   Collection Time: 03/07/15 10:51 PM  Result Value Ref Range Status   MRSA by PCR NEGATIVE NEGATIVE Final    Comment:        The GeneXpert MRSA Assay (FDA approved for NASAL specimens only), is one component of a comprehensive MRSA colonization surveillance program. It is not intended to diagnose MRSA infection nor to guide or monitor treatment for MRSA infections.      Studies: No results found.  Scheduled Meds: . ALPRAZolam  1  mg Oral QID  . antiseptic oral rinse  7 mL Mouth Rinse BID  . aspirin EC  81 mg Oral Daily  . digoxin  0.125 mg Oral Daily  . diltiazem  30 mg Oral 4 times per day  . furosemide  20 mg Intravenous Q6H  . ipratropium  0.5 mg Nebulization Q6H  . iron polysaccharides  150 mg Oral Daily  . levalbuterol  0.63 mg Nebulization TID  . levofloxacin  750 mg Oral Q48H  . levothyroxine  50 mcg Oral QODAY  . pantoprazole  40 mg Oral Daily  . potassium chloride  20 mEq Oral Daily  . sodium chloride  3 mL Intravenous Q12H   Continuous Infusions: . amiodarone 30 mg/hr (03/10/15 2001)  . heparin 850 Units/hr (03/11/15 0840)    Time spent: 25 minutes  [place]  [facility] [email or web address], password  [facility] 03/11/2015, 9:54 AM  LOS: 4 days

## 2015-03-12 DIAGNOSIS — E669 Obesity, unspecified: Secondary | ICD-10-CM

## 2015-03-12 LAB — CBC
HCT: 34.9 % — ABNORMAL LOW (ref 36.0–46.0)
HEMOGLOBIN: 10.3 g/dL — AB (ref 12.0–15.0)
MCH: 23.2 pg — ABNORMAL LOW (ref 26.0–34.0)
MCHC: 29.5 g/dL — AB (ref 30.0–36.0)
MCV: 78.6 fL (ref 78.0–100.0)
PLATELETS: 193 10*3/uL (ref 150–400)
RBC: 4.44 MIL/uL (ref 3.87–5.11)
RDW: 16.8 % — ABNORMAL HIGH (ref 11.5–15.5)
WBC: 6.2 10*3/uL (ref 4.0–10.5)

## 2015-03-12 LAB — GLUCOSE, CAPILLARY: GLUCOSE-CAPILLARY: 103 mg/dL — AB (ref 65–99)

## 2015-03-12 LAB — HEPARIN LEVEL (UNFRACTIONATED): HEPARIN UNFRACTIONATED: 0.64 [IU]/mL (ref 0.30–0.70)

## 2015-03-12 MED ORDER — DILTIAZEM HCL 60 MG PO TABS
90.0000 mg | ORAL_TABLET | Freq: Four times a day (QID) | ORAL | Status: DC
  Administered 2015-03-12 – 2015-03-14 (×8): 90 mg via ORAL
  Filled 2015-03-12 (×16): qty 1

## 2015-03-12 MED ORDER — BISACODYL 10 MG RE SUPP
10.0000 mg | Freq: Once | RECTAL | Status: AC
  Administered 2015-03-12: 10 mg via RECTAL
  Filled 2015-03-12: qty 1

## 2015-03-12 MED ORDER — LACTULOSE 10 GM/15ML PO SOLN
20.0000 g | Freq: Two times a day (BID) | ORAL | Status: DC | PRN
  Administered 2015-03-12: 20 g via ORAL
  Filled 2015-03-12: qty 30

## 2015-03-12 MED ORDER — DIPHENHYDRAMINE HCL 25 MG PO CAPS
25.0000 mg | ORAL_CAPSULE | Freq: Once | ORAL | Status: AC
  Administered 2015-03-13: 25 mg via ORAL
  Filled 2015-03-12: qty 1

## 2015-03-12 MED ORDER — LEVALBUTEROL HCL 0.63 MG/3ML IN NEBU: 0.6300 mg | INHALATION_SOLUTION | Freq: Four times a day (QID) | RESPIRATORY_TRACT | Status: DC | PRN

## 2015-03-12 MED ORDER — LACTULOSE 10 GM/15ML PO SOLN: 10.0000 g | Freq: Once | ORAL | Status: AC

## 2015-03-12 MED ORDER — IPRATROPIUM BROMIDE 0.02 % IN SOLN: 0.5000 mg | Freq: Four times a day (QID) | RESPIRATORY_TRACT | Status: DC | PRN

## 2015-03-12 MED ORDER — DIPHENHYDRAMINE HCL 25 MG PO CAPS
25.0000 mg | ORAL_CAPSULE | Freq: Once | ORAL | Status: AC
  Administered 2015-03-12: 25 mg via ORAL
  Filled 2015-03-12: qty 1

## 2015-03-12 NOTE — Progress Notes (Signed)
RN assessed need to continue foley catheter for patient.  RN unable to find sufficient reason for foley catheter to remain anchored.  RN into room to weigh patient this morning and discussed need to remove foley catheter with patient.  Patient refused at this time.  RN educated patient that she was no longer receiving lasix, the medicine that makes her need to urinate frequently and RN also educated patient pertaining to removing the foley catheter as soon as possible to prevent patient from getting a urinary tract infection and the longer the foley catheter stayed in place the more at risk she was for developing a urinary tract infection.  Patient stated understanding and continued to refuse to let RN remove foley catheter this morning.  RN text paged [facility] pertaining to patient's refusal.

## 2015-03-12 NOTE — Progress Notes (Signed)
[facility] PROGRESS NOTE  Sharon PEEKS WJX:[id number] DOB: 04/19/1941 DOA: 03/07/2015 PCP: Claretta Fraise, MD  Assessment/Plan:    Acute on chronic diastolic CHF (congestive heart failure), NYHA class 3: secondary to noncompliance, rapid AF. on lasix IV. Needs better HR control -down 6.5L so far CR trending up -heparin gtt    Obesity   Peripheral edema   CAD (coronary artery disease), native coronary artery   Depression with anxiety   Atrial fib with RVR. Changed to amiodarone due to hypotension on cardizem.   Lisinopril stopped. Still tachycardic -digoxin Fibromyalgia  PT eval recommends home health- ordered  HPI/Subjective: Much more energy today and is feeling better  Objective: Filed Vitals:   03/12/15 1520  BP:   Pulse:   Temp: 97.7 F (36.5 C)  Resp:     Intake/Output Summary (Last 24 hours) at 03/12/15 1537 Last data filed at 03/12/15 1437  Gross per 24 hour  Intake 1462.4 ml  Output   3450 ml  Net -1987.6 ml   Filed Weights   03/11/15 0625 03/12/15 0445 03/12/15 0608  Weight: 82.101 kg (181 lb) 84.1 kg (185 lb 6.5 oz) 82.056 kg (180 lb 14.4 oz)    Exam:   General:   Just had a bath  Cardiovascular: irreg irreg. fast  Respiratory: CTA without WRR  Abdomen: obese, s, nt, nd  Ext: edema present, not really pitting.  Basic Metabolic Panel:  Recent Labs Lab 03/07/15 1210 03/08/15 0113 03/09/15 0420 03/10/15 0316 03/11/15 0348  NA 141 141 136 138 139  K 4.0 3.5 4.0 4.1 4.4  CL 103 103 97* 95* 93*  CO2 30 31 31  36* 36*  GLUCOSE 116* 123* 149* 133* 87  BUN 10 8 8 12 13   CREATININE 0.97 1.01* 1.09* 1.18* 1.33*  CALCIUM 9.0 8.7* 8.6* 8.5* 8.7*   Liver Function Tests: No results for input(s): AST, ALT, ALKPHOS, BILITOT, PROT, ALBUMIN in the last 168 hours. No results for input(s): LIPASE, AMYLASE in the last 168 hours. No results for input(s): AMMONIA in the last 168 hours. CBC:  Recent Labs Lab 03/08/15 0113 03/09/15 0420  03/10/15 0316 03/11/15 0348 03/12/15 0825  WBC 7.7 4.1 6.1 6.6 6.2  HGB 9.5* 9.2* 9.2* 9.5* 10.3*  HCT 32.3* 31.7* 32.5* 32.0* 34.9*  MCV 79.2 80.3 80.4 80.6 78.6  PLT 232 181 188 182 193   Cardiac Enzymes:  Recent Labs Lab 03/07/15 1210 03/07/15 2020 03/08/15 0113 03/08/15 0747  TROPONINI <0.03 <0.03 <0.03 <0.03   BNP (last 3 results)  Recent Labs  01/21/15 0830 03/07/15 1210  BNP 243.0* 390.3*    ProBNP (last 3 results) No results for input(s): PROBNP in the last 8760 hours.  CBG: No results for input(s): GLUCAP in the last 168 hours.  Recent Results (from the past 240 hour(s))  MRSA PCR Screening     Status: None   Collection Time: 03/07/15 10:51 PM  Result Value Ref Range Status   MRSA by PCR NEGATIVE NEGATIVE Final    Comment:        The GeneXpert MRSA Assay (FDA approved for NASAL specimens only), is one component of a comprehensive MRSA colonization surveillance program. It is not intended to diagnose MRSA infection nor to guide or monitor treatment for MRSA infections.      Studies: No results found.  Scheduled Meds: . ALPRAZolam  1 mg Oral QID  . antiseptic oral rinse  7 mL Mouth Rinse BID  . aspirin EC  81 mg Oral Daily  .  digoxin  0.125 mg Oral Daily  . diltiazem  90 mg Oral 4 times per day  . iron polysaccharides  150 mg Oral Daily  . levofloxacin  750 mg Oral Q48H  . levothyroxine  50 mcg Oral QODAY  . pantoprazole  40 mg Oral Daily  . polyethylene glycol  17 g Oral Daily  . potassium chloride  20 mEq Oral Daily  . sodium chloride  3 mL Intravenous Q12H   Continuous Infusions: . amiodarone 30 mg/hr (03/12/15 1035)  . heparin 850 Units/hr (03/12/15 1035)    Time spent: 25 minutes  VANN, JESSICA  [facility] [email or web address], password [facility] 03/12/2015, 3:37 PM  LOS: 5 days

## 2015-03-12 NOTE — Progress Notes (Signed)
ANTICOAGULATION CONSULT NOTE - Follow Up Consult  Pharmacy Consult for Heparin  Indication: atrial fibrillation  Allergies  Allergen Reactions  . Iohexol Shortness Of Breath    PT STATES CONTRAST ALLERGY CAUSED SHORTNESS OF BREATH IN THE 70'S  . Dye Fdc Red [Red Dye] Other (See Comments)    Pt.states she passed out  . Sulfa Antibiotics Itching  . Codeine Itching and Palpitations    Patient Measurements: Height: 5' (152.4 cm) Weight: 180 lb 14.4 oz (82.056 kg) IBW/kg (Calculated) : 45.5   Vital Signs: Temp: 98 F (36.7 C) (09/17 0739) Temp Source: Oral (09/17 0739) BP: 111/60 mmHg (09/17 0500) Pulse Rate: 88 (09/17 0500)  Labs:  Recent Labs  03/10/15 0316 03/11/15 0348 03/11/15 0930 03/12/15 0825  HGB 9.2* 9.5*  --  10.3*  HCT 32.5* 32.0*  --  34.9*  PLT 188 182  --  193  HEPARINUNFRC 0.65 1.86* 0.54 0.64  CREATININE 1.18* 1.33*  --   --     Estimated Creatinine Clearance: 35.2 mL/min (by C-G formula based on Cr of 1.33).   Medications:  Scheduled:  . ALPRAZolam  1 mg Oral QID  . antiseptic oral rinse  7 mL Mouth Rinse BID  . aspirin EC  81 mg Oral Daily  . digoxin  0.125 mg Oral Daily  . diltiazem  90 mg Oral 4 times per day  . ipratropium  0.5 mg Nebulization Q6H  . iron polysaccharides  150 mg Oral Daily  . levalbuterol  0.63 mg Nebulization TID  . levofloxacin  750 mg Oral Q48H  . levothyroxine  50 mcg Oral QODAY  . pantoprazole  40 mg Oral Daily  . polyethylene glycol  17 g Oral Daily  . potassium chloride  20 mEq Oral Daily  . sodium chloride  3 mL Intravenous Q12H   Infusions:  . amiodarone 30 mg/hr (03/12/15 1035)  . heparin 850 Units/hr (03/12/15 1035)   PRN: acetaminophen, nitroGLYCERIN, ondansetron (ZOFRAN) IV  Assessment: 60 YOF on heparin for Afib [CHADSVASc score of at least 5 (age, sex, CHF, HTN, vascular disease] however PMH significant for recurrent GIB so not on anticoagulation PTA due to GI bleed risk.   HL therapeutic at 0.64,  Hgb stable 10.3, plts stable 193. No s/sx bleeding noted.    Goal of Therapy:  Heparin level 0.3-0.7 units/ml Monitor platelets by anticoagulation protocol: Yes   Plan:  Continue heparin gtt at 850 units / hr   Daily CBC/HL Monitor for s/sx of bleeding   Bennye Alm, PharmD Pharmacy Resident [phone]

## 2015-03-12 NOTE — Progress Notes (Signed)
Primary Cardiologist: Dr. Bronson Ing  Subjective:  States that her breathing is a little bit better. Still struggling with adequate rate control of atrial fibrillation although improved. She is complaining about blood draws.  Objective:  Vital Signs in the last 24 hours: Temp:  [97.8 F (36.6 C)-98.7 F (37.1 C)] 98 F (36.7 C) (09/17 0739) Pulse Rate:  [76-141] 88 (09/17 0500) Resp:  [18-31] 22 (09/17 0500) BP: (97-111)/(50-79) 111/60 mmHg (09/17 0500) SpO2:  [95 %-100 %] 100 % (09/17 0500) Weight:  [180 lb 14.4 oz (82.056 kg)-185 lb 6.5 oz (84.1 kg)] 180 lb 14.4 oz (82.056 kg) (09/17 9030)  Intake/Output from previous day: 09/16 0701 - 09/17 0700 In: 1762.4 [P.O.:1460; I.V.:302.4] Out: 2350 [Urine:2350]   Physical Exam: General: Well developed, well nourished, in no acute distress. Head:  Normocephalic and atraumatic. Lungs: Mild rhonchi heard bilaterally Heart: Irregularly irregular, mildly tachycardic No murmur, rubs or gallops.  Abdomen: soft, non-tender, positive bowel sounds. Overweight Extremities: No clubbing or cyanosis. No edema. Neurologic: Alert and oriented x 3.    Lab Results:  Recent Labs  03/11/15 0348 03/12/15 0825  WBC 6.6 6.2  HGB 9.5* 10.3*  PLT 182 193    Recent Labs  03/10/15 0316 03/11/15 0348  NA 138 139  K 4.1 4.4  CL 95* 93*  CO2 36* 36*  GLUCOSE 133* 87  BUN 12 13  CREATININE 1.18* 1.33*   Telemetry: Age fibrillation with heart rates mostly above 100 Personally viewed.  ECHO: Technically difficult study. LVEF 55-60%, moderate LVH, mild LAE, trivial TR, normal RVSP, trivial pericardial effusion without tamponade features, left pleural effusion. Scheduled Meds: . ALPRAZolam  1 mg Oral QID  . antiseptic oral rinse  7 mL Mouth Rinse BID  . aspirin EC  81 mg Oral Daily  . digoxin  0.125 mg Oral Daily  . diltiazem  60 mg Oral 4 times per day  . ipratropium  0.5 mg Nebulization Q6H  . iron polysaccharides  150 mg Oral Daily   . levalbuterol  0.63 mg Nebulization TID  . levofloxacin  750 mg Oral Q48H  . levothyroxine  50 mcg Oral QODAY  . pantoprazole  40 mg Oral Daily  . polyethylene glycol  17 g Oral Daily  . potassium chloride  20 mEq Oral Daily  . sodium chloride  3 mL Intravenous Q12H   Continuous Infusions: . amiodarone 30 mg/hr (03/12/15 1035)  . heparin 850 Units/hr (03/12/15 1035)   PRN Meds:.acetaminophen, nitroGLYCERIN, ondansetron (ZOFRAN) IV   Assessment/Plan:  Principal Problem:   Acute on chronic diastolic CHF (congestive heart failure), NYHA class 3 Active Problems:   Obesity   Peripheral edema   CAD (coronary artery disease), native coronary artery   Depression with anxiety   A-fib   Heart failure   Bilateral edema of lower extremity   Rapid atrial fibrillation   Thyroid activity decreased   Chronic atrial fibrillation   Acute diastolic CHF (congestive heart failure), NYHA class 49  74 year old female with paroxysmal atrial fibrillation not on anticoagulation secondary to history of GI bleed (although currently on IV heparin) with hypertension, coronary artery disease, recent non-ST elevation myocardial infarction, COPD, fibromyalgia, anxiety primarily here for atrial fibrillation with rapid ventricular response in the setting of acute bronchitis with history of COPD.  1. Atrial fibrillation paroxysmal  - CHADSVASc score of at least 5 (age, sex, CHF, HTN, vascular disease). Not on anticoagulation due to Hx of GIB  - Still is quite challenging rate control.  -  Continue digoxin 0.125 mg a day  - Increase diltiazem 90 mg every 6 hours  - We will continue with IV amiodarone infusion. Hopeful change to by mouth soon.  - Not previously a candidate for electrical cardioversion secondary to unable to provide long-term anticoagulation secondary to history of GI bleeds and peptic ulcer disease.  2. Acute on chronic diastolic heart failure  - Likely euvolemic at this point.  - Lasix on  hold secondary to creatinine increased.  - Atrial fibrillation with rapid ventricular response is exacerbating this.  3. Acute bronchitis with history of COPD  - Levaquin, Xopenex  - Hopefully as this improves, her A. fib will improve as well.  4. Obesity  - Encourage weight loss.  SKAINS, [place] 03/12/2015, 10:42 AM

## 2015-03-13 LAB — BASIC METABOLIC PANEL
Anion gap: 10 (ref 5–15)
BUN: 15 mg/dL (ref 6–20)
CO2: 33 mmol/L — ABNORMAL HIGH (ref 22–32)
Calcium: 9.4 mg/dL (ref 8.9–10.3)
Chloride: 96 mmol/L — ABNORMAL LOW (ref 101–111)
Creatinine, Ser: 1.29 mg/dL — ABNORMAL HIGH (ref 0.44–1.00)
GFR calc Af Amer: 46 mL/min — ABNORMAL LOW (ref >60)
GFR calc non Af Amer: 40 mL/min — ABNORMAL LOW (ref >60)
Glucose, Bld: 123 mg/dL — ABNORMAL HIGH (ref 65–99)
Potassium: 3.9 mmol/L (ref 3.5–5.1)
Sodium: 139 mmol/L (ref 135–145)

## 2015-03-13 LAB — CBC
HCT: 34.7 % — ABNORMAL LOW (ref 36.0–46.0)
Hemoglobin: 10.5 g/dL — ABNORMAL LOW (ref 12.0–15.0)
MCH: 23.8 pg — ABNORMAL LOW (ref 26.0–34.0)
MCHC: 30.3 g/dL (ref 30.0–36.0)
MCV: 78.5 fL (ref 78.0–100.0)
PLATELETS: 167 10*3/uL (ref 150–400)
RBC: 4.42 MIL/uL (ref 3.87–5.11)
RDW: 16.7 % — AB (ref 11.5–15.5)
WBC: 8.6 10*3/uL (ref 4.0–10.5)

## 2015-03-13 LAB — HEPARIN LEVEL (UNFRACTIONATED)
Heparin Unfractionated: 0.74 IU/mL — ABNORMAL HIGH (ref 0.30–0.70)
Heparin Unfractionated: <0.1 IU/mL — ABNORMAL LOW (ref 0.30–0.70)

## 2015-03-13 MED ORDER — AMIODARONE HCL 200 MG PO TABS
200.0000 mg | ORAL_TABLET | Freq: Two times a day (BID) | ORAL | Status: DC
  Administered 2015-03-13 – 2015-03-15 (×5): 200 mg via ORAL
  Filled 2015-03-13 (×5): qty 1

## 2015-03-13 NOTE — Progress Notes (Signed)
Primary Cardiologist: Dr. Bronson Ing  Subjective:  States that her breathing is a little bit better. Still struggling with adequate rate control of atrial fibrillation although improved. She is complaining about blood draws.  Objective:  Vital Signs in the last 24 hours: Temp:  [97.4 F (36.3 C)-98 F (36.7 C)] 98 F (36.7 C) (09/18 0500) Pulse Rate:  [26-106] 106 (09/18 0500) Resp:  [18-24] 24 (09/18 0500) BP: (113-122)/(52-78) 122/52 mmHg (09/18 0500) SpO2:  [93 %-98 %] 96 % (09/18 0500) Weight:  [182 lb 3.2 oz (82.645 kg)] 182 lb 3.2 oz (82.645 kg) (09/18 0500)  Intake/Output from previous day: 09/17 0701 - 09/18 0700 In: 1222.4 [P.O.:920; I.V.:302.4] Out: 1351 [Urine:1350; Stool:1]   Physical Exam: General: Well developed, well nourished, in no acute distress. Head:  Normocephalic and atraumatic. Lungs: Mild rhonchi heard bilaterally Heart: Irregularly irregular, mildly tachycardic No murmur, rubs or gallops.  Abdomen: soft, non-tender, positive bowel sounds. Overweight Extremities: No clubbing or cyanosis. No edema. Neurologic: Alert and oriented x 3.    Lab Results:  Recent Labs  03/12/15 0825 03/13/15 0350  WBC 6.2 8.6  HGB 10.3* 10.5*  PLT 193 167    Recent Labs  03/11/15 0348 03/13/15 0350  NA 139 139  K 4.4 3.9  CL 93* 96*  CO2 36* 33*  GLUCOSE 87 123*  BUN 13 15  CREATININE 1.33* 1.29*   Telemetry: Age fibrillation with heart rates mostly above 100 Personally viewed.  ECHO: Technically difficult study. LVEF 55-60%, moderate LVH, mild LAE, trivial TR, normal RVSP, trivial pericardial effusion without tamponade features, left pleural effusion. Scheduled Meds: . ALPRAZolam  1 mg Oral QID  . antiseptic oral rinse  7 mL Mouth Rinse BID  . aspirin EC  81 mg Oral Daily  . digoxin  0.125 mg Oral Daily  . diltiazem  90 mg Oral 4 times per day  . iron polysaccharides  150 mg Oral Daily  . levofloxacin  750 mg Oral Q48H  . levothyroxine  50  mcg Oral QODAY  . pantoprazole  40 mg Oral Daily  . polyethylene glycol  17 g Oral Daily  . potassium chloride  20 mEq Oral Daily  . sodium chloride  3 mL Intravenous Q12H   Continuous Infusions: . amiodarone 30 mg/hr (03/12/15 2345)  . heparin 800 Units/hr (03/13/15 0954)   PRN Meds:.acetaminophen, ipratropium, lactulose, levalbuterol, nitroGLYCERIN, ondansetron (ZOFRAN) IV   Assessment/Plan:  Principal Problem:   Acute on chronic diastolic CHF (congestive heart failure), NYHA class 3 Active Problems:   Obesity   Peripheral edema   CAD (coronary artery disease), native coronary artery   Depression with anxiety   A-fib   Heart failure   Bilateral edema of lower extremity   Rapid atrial fibrillation   Thyroid activity decreased   Chronic atrial fibrillation   Acute diastolic CHF (congestive heart failure), NYHA class 51  74 year old female with paroxysmal atrial fibrillation not on anticoagulation secondary to history of recurrent GI bleed with hypertension, coronary artery disease, recent non-ST elevation myocardial infarction, COPD, fibromyalgia, anxiety primarily here for atrial fibrillation with rapid ventricular response in the setting of acute bronchitis with history of COPD.  1. Atrial fibrillation paroxysmal-currently flutter pattern  - CHADSVASc score of at least 5 (age, sex, CHF, HTN, vascular disease). Not on anticoagulation due to Hx of GIB-see below for GI bleed history  - Improved rate control.  - Continue digoxin 0.125 mg a day  - Increased diltiazem to 90 mg every 6 hours, hopeful  consolidation tomorrow  - We will change amiodarone to by mouth 400 mg twice a day. Taper to 200 mg twice a day on discharge.  - Not previously a candidate for electrical cardioversion secondary to unable to provide long-term anticoagulation secondary to history of GI bleeds and peptic ulcer disease.  From Dr. Harl Bowie note:  GI Bleed history  - initial GI bleed 08/2011 on xarelto. Hgb  8.7, transfused 2 units pRBCs. 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 Recs were if bleeding reoccured to consider IR ablation or possible hemicolectomy. Xarelto was stopped for one week then resumed  - from 10/06/11 discharge summary, patient admitted with hemoatchezia after recently having been started on xarelto. She had a prior GI bleed related to cecal AV malformations. Admit Hgb 7, ransfused 4 units pRBCs. Initial colonscopy had poor prep, she refused further testing. Recommendation was not to resume anticoagulation and she has not been on since.  Will stop heparin IV since she is not a long term anticoagulation candidate. She is at increased risk of CVA.   2. Acute on chronic diastolic heart failure  - Likely euvolemic at this point.  - Lasix on hold secondary to creatinine increased.  - Atrial fibrillation with rapid ventricular response is exacerbating this.  3. Acute bronchitis with history of COPD  - Levaquin, Xopenex  - Hopefully as this improves, her A. fib will improve as well.  4. Obesity  - Encourage weight loss.  Possible DC tomorrow if remaining stable.  SKAINS, [place] 03/13/2015, 10:06 AM

## 2015-03-13 NOTE — Progress Notes (Signed)
[facility] PROGRESS NOTE  Sharon Compton EML:[id number] DOB: 1941/06/10 DOA: 03/07/2015 PCP: Claretta Fraise, MD  Assessment/Plan:    Acute on chronic diastolic CHF (congestive heart failure), NYHA class 3: secondary to noncompliance, rapid AF. on lasix IV. Needs better HR control -down 7.5L so far CR stable -heparin gtt- not a candidate for long term anticoagulation    Obesity   Peripheral edema   CAD (coronary artery disease), native coronary artery   Depression with anxiety   Atrial fib with RVR. Changed to amiodarone due to hypotension on cardizem.   Lisinopril stopped. Still tachycardic -digoxin Fibromyalgia  PT eval recommends home health- ordered  HPI/Subjective: Continues to improve  Objective: Filed Vitals:   03/13/15 0500  BP: 122/52  Pulse: 106  Temp: 98 F (36.7 C)  Resp: 24    Intake/Output Summary (Last 24 hours) at 03/13/15 1233 Last data filed at 03/13/15 1000  Gross per 24 hour  Intake 1222.4 ml  Output   1601 ml  Net -378.6 ml   Filed Weights   03/12/15 0445 03/12/15 0608 03/13/15 0500  Weight: 84.1 kg (185 lb 6.5 oz) 82.056 kg (180 lb 14.4 oz) 82.645 kg (182 lb 3.2 oz)    Exam:   General:   Awake, NAD  Cardiovascular: irreg irreg. fast  Respiratory: CTA without WRR  Abdomen: obese, s, nt, nd  Ext: edema present, not really pitting.  Basic Metabolic Panel:  Recent Labs Lab 03/08/15 0113 03/09/15 0420 03/10/15 0316 03/11/15 0348 03/13/15 0350  NA 141 136 138 139 139  K 3.5 4.0 4.1 4.4 3.9  CL 103 97* 95* 93* 96*  CO2 31 31 36* 36* 33*  GLUCOSE 123* 149* 133* 87 123*  BUN 8 8 12 13 15   CREATININE 1.01* 1.09* 1.18* 1.33* 1.29*  CALCIUM 8.7* 8.6* 8.5* 8.7* 9.4   Liver Function Tests: No results for input(s): AST, ALT, ALKPHOS, BILITOT, PROT, ALBUMIN in the last 168 hours. No results for input(s): LIPASE, AMYLASE in the last 168 hours. No results for input(s): AMMONIA in the last 168 hours. CBC:  Recent Labs Lab  03/09/15 0420 03/10/15 0316 03/11/15 0348 03/12/15 0825 03/13/15 0350  WBC 4.1 6.1 6.6 6.2 8.6  HGB 9.2* 9.2* 9.5* 10.3* 10.5*  HCT 31.7* 32.5* 32.0* 34.9* 34.7*  MCV 80.3 80.4 80.6 78.6 78.5  PLT 181 188 182 193 167   Cardiac Enzymes:  Recent Labs Lab 03/07/15 1210 03/07/15 2020 03/08/15 0113 03/08/15 0747  TROPONINI <0.03 <0.03 <0.03 <0.03   BNP (last 3 results)  Recent Labs  01/21/15 0830 03/07/15 1210  BNP 243.0* 390.3*    ProBNP (last 3 results) No results for input(s): PROBNP in the last 8760 hours.  CBG:  Recent Labs Lab 03/12/15 1608  GLUCAP 103*    Recent Results (from the past 240 hour(s))  MRSA PCR Screening     Status: None   Collection Time: 03/07/15 10:51 PM  Result Value Ref Range Status   MRSA by PCR NEGATIVE NEGATIVE Final    Comment:        The GeneXpert MRSA Assay (FDA approved for NASAL specimens only), is one component of a comprehensive MRSA colonization surveillance program. It is not intended to diagnose MRSA infection nor to guide or monitor treatment for MRSA infections.      Studies: No results found.  Scheduled Meds: . ALPRAZolam  1 mg Oral QID  . amiodarone  200 mg Oral BID  . antiseptic oral rinse  7 mL Mouth Rinse  BID  . aspirin EC  81 mg Oral Daily  . digoxin  0.125 mg Oral Daily  . diltiazem  90 mg Oral 4 times per day  . iron polysaccharides  150 mg Oral Daily  . levofloxacin  750 mg Oral Q48H  . levothyroxine  50 mcg Oral QODAY  . pantoprazole  40 mg Oral Daily  . polyethylene glycol  17 g Oral Daily  . potassium chloride  20 mEq Oral Daily  . sodium chloride  3 mL Intravenous Q12H   Continuous Infusions:    Time spent: 25 minutes  VANN, JESSICA  [facility] [email or web address], password [facility] 03/13/2015, 12:33 PM  LOS: 6 days

## 2015-03-13 NOTE — Progress Notes (Signed)
ANTICOAGULATION CONSULT NOTE - Follow Up Consult  Pharmacy Consult for Heparin Indication: atrial fibrillation  Allergies  Allergen Reactions  . Iohexol Shortness Of Breath    PT STATES CONTRAST ALLERGY CAUSED SHORTNESS OF BREATH IN THE 70'S  . Dye Fdc Red [Red Dye] Other (See Comments)    Pt.states she passed out  . Sulfa Antibiotics Itching  . Codeine Itching and Palpitations    Patient Measurements: Height: 5' (152.4 cm) Weight: 182 lb 3.2 oz (82.645 kg) IBW/kg (Calculated) : 45.5  Heparin Dosing Weight: 65 kg   Vital Signs: Temp: 98 F (36.7 C) (09/18 0500) Temp Source: Oral (09/18 0500) BP: 122/52 mmHg (09/18 0500) Pulse Rate: 106 (09/18 0500)  Labs:  Recent Labs  03/11/15 0348 03/11/15 0930 03/12/15 0825 03/13/15 0350  HGB 9.5*  --  10.3* 10.5*  HCT 32.0*  --  34.9* 34.7*  PLT 182  --  193 167  HEPARINUNFRC 1.86* 0.54 0.64 0.74*  CREATININE 1.33*  --   --  1.29*    Estimated Creatinine Clearance: 36.4 mL/min (by C-G formula based on Cr of 1.29).   Medications:  Scheduled:  . ALPRAZolam  1 mg Oral QID  . antiseptic oral rinse  7 mL Mouth Rinse BID  . aspirin EC  81 mg Oral Daily  . digoxin  0.125 mg Oral Daily  . diltiazem  90 mg Oral 4 times per day  . iron polysaccharides  150 mg Oral Daily  . levofloxacin  750 mg Oral Q48H  . levothyroxine  50 mcg Oral QODAY  . pantoprazole  40 mg Oral Daily  . polyethylene glycol  17 g Oral Daily  . potassium chloride  20 mEq Oral Daily  . sodium chloride  3 mL Intravenous Q12H   Infusions:  . amiodarone 30 mg/hr (03/12/15 2345)  . heparin 850 Units/hr (03/12/15 1035)   PRN: acetaminophen, ipratropium, lactulose, levalbuterol, nitroGLYCERIN, ondansetron (ZOFRAN) IV  Assessment: 36 YOF on heparin for Afib [CHADSVASc score of at least 5 (age, sex, CHF, HTN, vascular disease] however PMH significant for recurrent GIB so not on anticoagulation PTA due to GI bleed risk.  HL supratherapeutic at 0.74, Hgb  stable 10.5, plts 167. No s/sx bleeding noted.  Goal of Therapy:  Heparin level 0.3-0.7 units/ml Monitor platelets by anticoagulation protocol: Yes   Plan:  Decrease heparin gtt to 800 units/hr 8 hr HL at 1745 Daily CBC/HL  Monitor for s/sx of bleeding.   Bennye Alm, PharmD Pharmacy Resident [phone]

## 2015-03-14 DIAGNOSIS — I483 Typical atrial flutter: Secondary | ICD-10-CM

## 2015-03-14 DIAGNOSIS — F418 Other specified anxiety disorders: Secondary | ICD-10-CM

## 2015-03-14 LAB — CBC
HEMATOCRIT: 36.3 % (ref 36.0–46.0)
HEMOGLOBIN: 10.7 g/dL — AB (ref 12.0–15.0)
MCH: 23.1 pg — ABNORMAL LOW (ref 26.0–34.0)
MCHC: 29.5 g/dL — ABNORMAL LOW (ref 30.0–36.0)
MCV: 78.2 fL (ref 78.0–100.0)
Platelets: 163 10*3/uL (ref 150–400)
RBC: 4.64 MIL/uL (ref 3.87–5.11)
RDW: 16.6 % — ABNORMAL HIGH (ref 11.5–15.5)
WBC: 5.1 10*3/uL (ref 4.0–10.5)

## 2015-03-14 MED ORDER — DILTIAZEM HCL ER COATED BEADS 180 MG PO CP24
360.0000 mg | ORAL_CAPSULE | Freq: Every day | ORAL | Status: DC
  Administered 2015-03-14 – 2015-03-15 (×2): 360 mg via ORAL
  Filled 2015-03-14 (×2): qty 2

## 2015-03-14 MED ORDER — METOPROLOL TARTRATE 12.5 MG HALF TABLET
12.5000 mg | ORAL_TABLET | Freq: Two times a day (BID) | ORAL | Status: DC
  Administered 2015-03-14 – 2015-03-15 (×3): 12.5 mg via ORAL
  Filled 2015-03-14 (×3): qty 1

## 2015-03-14 NOTE — Progress Notes (Signed)
Patient Name: Sharon Compton Date of Encounter: 03/14/2015     Principal Problem:   Acute on chronic diastolic CHF (congestive heart failure), NYHA class 3 Active Problems:   Obesity   Peripheral edema   CAD (coronary artery disease), native coronary artery   Depression with anxiety   A-fib   Heart failure   Bilateral edema of lower extremity   Rapid atrial fibrillation   Thyroid activity decreased   Chronic atrial fibrillation   Acute diastolic CHF (congestive heart failure), NYHA class 3   Primary Cardiologist: Dr. Bronson Ing  Patient Profile: 74 yo female w/ PMH of PAF (not on anticoagulation 2ry to hx of GIB), HTN, CAD (BMS to RCA 08/2012, admitted 01/21/2015 for NSTEMI with nonobstructive disease at that time), COPD, Fibromyalgia, and Anxiety admitted 03/07/2015 for atrial fibrillation with RVR.  SUBJECTIVE  Still SOB, has not ambulated much. No CP/  CURRENT MEDS . ALPRAZolam  1 mg Oral QID  . amiodarone  200 mg Oral BID  . antiseptic oral rinse  7 mL Mouth Rinse BID  . aspirin EC  81 mg Oral Daily  . digoxin  0.125 mg Oral Daily  . diltiazem  90 mg Oral 4 times per day  . iron polysaccharides  150 mg Oral Daily  . levofloxacin  750 mg Oral Q48H  . levothyroxine  50 mcg Oral QODAY  . pantoprazole  40 mg Oral Daily  . polyethylene glycol  17 g Oral Daily  . potassium chloride  20 mEq Oral Daily  . sodium chloride  3 mL Intravenous Q12H    OBJECTIVE  Filed Vitals:   03/13/15 1756 03/13/15 2130 03/14/15 0346 03/14/15 0615  BP: 118/70 118/51 111/70 131/68  Pulse: 115 116 119   Temp:  97.9 F (36.6 C) 98.1 F (36.7 C)   TempSrc:  Oral Oral   Resp:   17   Height:      Weight:   195 lb 1.7 oz (88.5 kg)   SpO2: 98% 94% 94%     Intake/Output Summary (Last 24 hours) at 03/14/15 0911 Last data filed at 03/14/15 0600  Gross per 24 hour  Intake    480 ml  Output   1875 ml  Net  -1395 ml   Filed Weights   03/12/15 0608 03/13/15 0500 03/14/15 0346  Weight:  180 lb 14.4 oz (82.056 kg) 182 lb 3.2 oz (82.645 kg) 195 lb 1.7 oz (88.5 kg)    PHYSICAL EXAM  General: Pleasant, NAD. Neuro: Alert and oriented X 3. Moves all extremities spontaneously. Psych: Normal affect. HEENT:  Normal  Neck: Supple without bruits or JVD. Lungs:  Resp regular and unlabored. Mildly diminished breath sound near bases, but no rale, largely CTA Heart: RRR no s3, s4, or murmurs. Abdomen: Soft, non-tender, non-distended, BS + x 4.  Extremities: No clubbing, cyanosis or edema. DP/PT/Radials 2+ and equal bilaterally.  Accessory Clinical Findings  CBC  Recent Labs  03/13/15 0350 03/14/15 0338  WBC 8.6 5.1  HGB 10.5* 10.7*  HCT 34.7* 36.3  MCV 78.5 78.2  PLT 167 295   Basic Metabolic Panel  Recent Labs  03/13/15 0350  NA 139  K 3.9  CL 96*  CO2 33*  GLUCOSE 123*  BUN 15  CREATININE 1.29*  CALCIUM 9.4    TELE Atrial flutter with HR 100-120, mostly in 2:1 atrial flutter with RVR    ECG  No new EKG  Echocardiogram 03/10/2015  LV EF: 55% -  60%  -------------------------------------------------------------------  Indications:   Dyspnea 786.09.  ------------------------------------------------------------------- History:  PMH:  Atrial fibrillation. Coronary artery disease. Congestive heart failure. PMH:  Myocardial infarction. Risk factors: Hypertension.  ------------------------------------------------------------------- Study Conclusions  - Procedure narrative: Transthoracic echocardiography. Image quality was poor. The study was technically difficult, as a result of poor sound wave transmission and body habitus. - Left ventricle: The cavity size was normal. Wall thickness was increased in a pattern of moderate LVH. Systolic function was normal. The estimated ejection fraction was in the range of 55% to 60%. Images were inadequate for LV wall motion assessment. The study is not technically sufficient to allow  evaluation of LV diastolic function. - Mitral valve: Mildly thickened leaflets . There was trivial regurgitation. - Left atrium: Mildly dilated at 38 ml/m2. - Right atrium: The atrium was mildly dilated. - Tricuspid valve: There was trivial regurgitation. - Pulmonary arteries: PA peak pressure: 19 mm Hg (S). - Inferior vena cava: The vessel was normal in size. The respirophasic diameter changes were in the normal range (>= 50%), consistent with normal central venous pressure. - Pericardium, extracardiac: A trivial pericardial effusion was identified. Features were not consistent with tamponade physiology. There was a left pleural effusion.  Impressions:  - Technically difficult study. LVEF 55-60%, moderate LVH, mild LAE, trivial TR, normal RVSP, trivial pericardial effusion without tamponade features, left pleural effusion.     Radiology/Studies  Dg Chest Port 1 View  03/07/2015   CLINICAL DATA:  Atrial fibrillation, shortness of breath.  EXAM: PORTABLE CHEST - 1 VIEW  COMPARISON:  01/21/2015  FINDINGS: There is a moderate right pleural effusion and small left pleural effusion. Heart is borderline in size. Mild vascular congestion. Bibasilar atelectasis.  IMPRESSION: Bilateral pleural effusions, right greater than left. Bibasilar atelectasis.  Mild vascular congestion.   Electronically Signed   By: Rolm Baptise M.D.   On: 03/07/2015 13:26    ASSESSMENT AND PLAN  1. atrial fibrillation with RVR - unknown duration - CHA2DS2-Vasc score 5 (HTN, CAD, female, age, HF), not on systemic anticoagulation due to h/o recurrent GIB, AVM of colon is the past, recurrent cecal AVM ablated in 07/2011. Also had PUD - Records reviewed, she has been on and taken off Cardizem in the past and her metoprolol dose has been up and down.  - was on amiodarone at d/c 10/2013 but TSH went up and it was d/c'd. Amio restarted during this admission. -  BB stopped due to active wheezing, started on diltiazem for rate control.  - currently on digoxin 0.125mg , diltiazem 90mg  q6Hr, amiodarone 200mg  BID. Did not start systemic anticoagulation due to inability to anticoagulate for long period of time  - rate control has been very challenging in this patient. Already on highest dose of diltiazem, will consolidate to 360mg  today, currently seems to be in atrial flutter, will obtain EKG, mostly 2:1 flutter overnight with HR 110-120s. Maybe able to add bisoprolol if SBP stable after diltiazem. PT to ambulate  2. Acute on chronic diastolic HF vs bronchitis - discharge dry weight was 181 on 01/25/2015, admission weight 188 9/12, now 195lbs likely not accurate - echo 03/10/2015 EF 55-60%, moderate LVH, mildly dilated LA  - Cr improving after holding lasix  3. Acute bronchitis with COPD  3. CAD (coronary artery disease), native coronary artery - hx BMS RCA - cath 01/24/2015 w/ non-obs dz, 50% mid LAD, 20% distal RCA, 70% small jailed RPDA lesion arrising from distal stented segment. Medical therapy.  - note from 11/03/2013, note that she does  not tolerate statins. Will let primary cardiologist address as OP   Signed, Woodward Ku Pager: [phone]  I have seen and examined the patient along with Almyra Deforest PA-C.  I have reviewed the chart, notes and new data.  I agree with PA's note.  Key new complaints: "I feel great" Key examination changes: tachycardia, 120s Key new findings / data: atrial flutter with 2:1 AV block, 4:1 AV block overnight  PLAN: Amiodarone will take time to "kick in" and provide better rate control. Meanwhile, add beta blocker. She was on metoprolol 50 mg BID pre-admission. Start at lower dose today.  Sanda Klein, MD, [place] [phone] 03/14/2015, 10:37 AM

## 2015-03-14 NOTE — Progress Notes (Signed)
Physical Therapy Treatment Patient Details Name: Sharon Compton MRN: [id number] DOB: 1940/12/05 Today's Date: 03/14/2015    History of Present Illness 74 yo F admitted to [facility] on 9/12 for worsening SOB and wheezing over the last month, chest pain, orthopnea, and LE edema. Diagnosed with acute chronic diastolic CHF, class 3. Pt has a PMH of A-fib, COPD, HTN and a surgical history of knee surgery and heart cathwith angiogram.    PT Comments    Pt is progressing well with her mobility today and HR is doing better as well.  60s-90s bpm during gait in hallway today.  PT will continue to follow acutely.  Per physician note she is refusing recommended HHPT at discharge.    Follow Up Recommendations  Home health PT;Supervision for mobility/OOB     Equipment Recommendations  None recommended by PT    Recommendations for Other Services   NA     Precautions / Restrictions Precautions Precautions: Fall Precaution Comments: per pt report her left knee gives out on her    Mobility  Bed Mobility Overal bed mobility: Modified Independent Bed Mobility: Supine to Sit;Sit to Supine     Supine to sit: Modified independent (Device/Increase time) Sit to supine: Modified independent (Device/Increase time)   General bed mobility comments: HOB elevated and using railing to help pull up and for leverage when getting back into bed.   Transfers Overall transfer level: Needs assistance Equipment used: 1 person hand held assist Transfers: Sit to/from Stand Sit to Stand: Min guard         General transfer comment: Min guard assist to get to standing from bed and toilet.  Pt just mildly unsteady on her feet.   Ambulation/Gait Ambulation/Gait assistance: Min assist Ambulation Distance (Feet): 120 Feet Assistive device: 1 person hand held assist (and hallway railing at times) Gait Pattern/deviations: Step-through pattern;Staggering left;Staggering right Gait velocity: decreased Gait velocity  interpretation: Below normal speed for age/gender General Gait Details: HR during gait today is 69-98 bpm during gait.  Mildly staggering gait pattern and pt reporting generalized weakness during gait.  She has been here over a week and has not been very mobile until today due to uncontrolled HR.         Balance Overall balance assessment: Needs assistance Sitting-balance support: Feet supported;No upper extremity supported Sitting balance-Leahy Scale: Good     Standing balance support: Single extremity supported;Bilateral upper extremity supported Standing balance-Leahy Scale: Poor                      Cognition Arousal/Alertness: Awake/alert Behavior During Therapy: WFL for tasks assessed/performed Overall Cognitive Status: Within Functional Limits for tasks assessed                             Pertinent Vitals/Pain Pain Assessment: No/denies pain           PT Goals (current goals can now be found in the care plan section) Acute Rehab PT Goals Patient Stated Goal: to get stronger and go back home Progress towards PT goals: Progressing toward goals    Frequency  Min 3X/week    PT Plan Current plan remains appropriate       End of Session   Activity Tolerance: Patient limited by fatigue Patient left: in bed;with call bell/phone within reach;with bed alarm set     Time: 7342-8768 PT Time Calculation (min) (ACUTE ONLY): 24 min  Charges:  $Gait Training:  8-22 mins $Therapeutic Activity: 8-22 mins                     Rebecca B. Tracyton, Clarence, DPT [phone]   03/14/2015, 3:31 PM

## 2015-03-14 NOTE — Care Management Important Message (Signed)
Important Message  Patient Details  Name: Sharon Compton MRN: [id number] Date of Birth: 12-Jun-1941   Medicare Important Message Given:  Yes-third notification given    Nathen May 03/14/2015, 4:46 PM

## 2015-03-14 NOTE — Progress Notes (Signed)
[facility] PROGRESS NOTE  JESSIKA ROTHERY OZD:[id number] DOB: [birth year] DOA: 03/07/2015 PCP: Claretta Fraise, MD  Assessment/Plan:  Acute on chronic diastolic CHF (congestive heart failure), NYHA class 3: secondary to noncompliance, rapid AF. on lasix IV. Needs better HR control -down 7.5L so far CR stable -heparin gtt- not a candidate for long term anticoagulation -added BB -oral amiodarone    Obesity   Peripheral edema   CAD (coronary artery disease), native coronary artery   Depression with anxiety   Atrial fib with RVR. Changed to amiodarone due to hypotension on cardizem.   Lisinopril stopped. Still tachycardic -digoxin Fibromyalgia  PT eval recommends home health- ordered but patient refused  HPI/Subjective: Continues to improve  Objective: Filed Vitals:   03/14/15 0924  BP: 121/62  Pulse:   Temp:   Resp:     Intake/Output Summary (Last 24 hours) at 03/14/15 1243 Last data filed at 03/14/15 0924  Gross per 24 hour  Intake    960 ml  Output   1750 ml  Net   -790 ml   Filed Weights   03/12/15 0608 03/13/15 0500 03/14/15 0346  Weight: 82.056 kg (180 lb 14.4 oz) 82.645 kg (182 lb 3.2 oz) 88.5 kg (195 lb 1.7 oz)    Exam:   General:   Awake, NAD  Cardiovascular: irreg irreg. fast  Respiratory: CTA without WRR  Abdomen: obese, s, nt, nd  Ext: edema present, not really pitting.  Basic Metabolic Panel:  Recent Labs Lab 03/08/15 0113 03/09/15 0420 03/10/15 0316 03/11/15 0348 03/13/15 0350  NA 141 136 138 139 139  K 3.5 4.0 4.1 4.4 3.9  CL 103 97* 95* 93* 96*  CO2 31 31 36* 36* 33*  GLUCOSE 123* 149* 133* 87 123*  BUN 8 8 12 13 15   CREATININE 1.01* 1.09* 1.18* 1.33* 1.29*  CALCIUM 8.7* 8.6* 8.5* 8.7* 9.4   Liver Function Tests: No results for input(s): AST, ALT, ALKPHOS, BILITOT, PROT, ALBUMIN in the last 168 hours. No results for input(s): LIPASE, AMYLASE in the last 168 hours. No results for input(s): AMMONIA in the last 168  hours. CBC:  Recent Labs Lab 03/10/15 0316 03/11/15 0348 03/12/15 0825 03/13/15 0350 03/14/15 0338  WBC 6.1 6.6 6.2 8.6 5.1  HGB 9.2* 9.5* 10.3* 10.5* 10.7*  HCT 32.5* 32.0* 34.9* 34.7* 36.3  MCV 80.4 80.6 78.6 78.5 78.2  PLT 188 182 193 167 163   Cardiac Enzymes:  Recent Labs Lab 03/07/15 2020 03/08/15 0113 03/08/15 0747  TROPONINI <0.03 <0.03 <0.03   BNP (last 3 results)  Recent Labs  01/21/15 0830 03/07/15 1210  BNP 243.0* 390.3*    ProBNP (last 3 results) No results for input(s): PROBNP in the last 8760 hours.  CBG:  Recent Labs Lab 03/12/15 1608  GLUCAP 103*    Recent Results (from the past 240 hour(s))  MRSA PCR Screening     Status: None   Collection Time: 03/07/15 10:51 PM  Result Value Ref Range Status   MRSA by PCR NEGATIVE NEGATIVE Final    Comment:        The GeneXpert MRSA Assay (FDA approved for NASAL specimens only), is one component of a comprehensive MRSA colonization surveillance program. It is not intended to diagnose MRSA infection nor to guide or monitor treatment for MRSA infections.      Studies: No results found.  Scheduled Meds: . ALPRAZolam  1 mg Oral QID  . amiodarone  200 mg Oral BID  . antiseptic oral rinse  7 mL Mouth Rinse BID  . aspirin EC  81 mg Oral Daily  . digoxin  0.125 mg Oral Daily  . diltiazem  360 mg Oral Daily  . iron polysaccharides  150 mg Oral Daily  . levofloxacin  750 mg Oral Q48H  . levothyroxine  50 mcg Oral QODAY  . metoprolol tartrate  12.5 mg Oral BID  . pantoprazole  40 mg Oral Daily  . polyethylene glycol  17 g Oral Daily  . potassium chloride  20 mEq Oral Daily  . sodium chloride  3 mL Intravenous Q12H   Continuous Infusions:    Time spent: 25 minutes  VANN, JESSICA  [facility] [email or web address], password [facility], [place] 03/14/2015, 12:43 PM  LOS: 7 days

## 2015-03-15 ENCOUNTER — Other Ambulatory Visit: Payer: Self-pay | Admitting: Family Medicine

## 2015-03-15 MED ORDER — DIGOXIN 125 MCG PO TABS
0.1250 mg | ORAL_TABLET | Freq: Every day | ORAL | Status: DC
Start: 2015-03-15 — End: 2015-04-04

## 2015-03-15 MED ORDER — DILTIAZEM HCL ER COATED BEADS 360 MG PO CP24: 360.0000 mg | ORAL_CAPSULE | Freq: Every day | ORAL | Status: DC

## 2015-03-15 MED ORDER — LACTULOSE 10 GM/15ML PO SOLN: 20.0000 g | Freq: Two times a day (BID) | ORAL | Status: DC | PRN

## 2015-03-15 MED ORDER — POTASSIUM CHLORIDE CRYS ER 20 MEQ PO TBCR: 20.0000 meq | EXTENDED_RELEASE_TABLET | Freq: Every day | ORAL | Status: DC

## 2015-03-15 MED ORDER — LEVOFLOXACIN 750 MG PO TABS: 750.0000 mg | ORAL_TABLET | ORAL | Status: DC

## 2015-03-15 MED ORDER — METOPROLOL TARTRATE 25 MG PO TABS: 12.5000 mg | ORAL_TABLET | Freq: Two times a day (BID) | ORAL | Status: DC

## 2015-03-15 MED ORDER — AMIODARONE HCL 200 MG PO TABS: 200.0000 mg | ORAL_TABLET | Freq: Two times a day (BID) | ORAL | Status: DC

## 2015-03-15 MED ORDER — TORSEMIDE 20 MG PO TABS: ORAL_TABLET | ORAL | Status: DC

## 2015-03-15 NOTE — Telephone Encounter (Signed)
Last seen 02/04/15  Dr Livia Snellen  If approved route to nurse to call into [facility]

## 2015-03-15 NOTE — Telephone Encounter (Signed)
Last filled and seen 02/04/15. Nurse call in at [facility]

## 2015-03-15 NOTE — Progress Notes (Signed)
Patient Name: Sharon Compton Date of Encounter: 03/15/2015  Principal Problem:   Acute on chronic diastolic CHF (congestive heart failure), NYHA class 3 Active Problems:   Obesity   Peripheral edema   CAD (coronary artery disease), native coronary artery   Depression with anxiety   A-fib   Heart failure   Bilateral edema of lower extremity   Rapid atrial fibrillation   Thyroid activity decreased   Chronic atrial fibrillation   Acute diastolic CHF (congestive heart failure), NYHA class 3   Length of Stay: 8  SUBJECTIVE  Feels well and is "really ready to go home".  Adequate rate control (80-90), remains in atrial flutter with variable AV block. Denies angina or dyspnea. No edema  CURRENT MEDS . ALPRAZolam  1 mg Oral QID  . amiodarone  200 mg Oral BID  . antiseptic oral rinse  7 mL Mouth Rinse BID  . aspirin EC  81 mg Oral Daily  . digoxin  0.125 mg Oral Daily  . diltiazem  360 mg Oral Daily  . iron polysaccharides  150 mg Oral Daily  . levofloxacin  750 mg Oral Q48H  . levothyroxine  50 mcg Oral QODAY  . metoprolol tartrate  12.5 mg Oral BID  . pantoprazole  40 mg Oral Daily  . polyethylene glycol  17 g Oral Daily  . potassium chloride  20 mEq Oral Daily  . sodium chloride  3 mL Intravenous Q12H    OBJECTIVE   Intake/Output Summary (Last 24 hours) at 03/15/15 0840 Last data filed at 03/15/15 0617  Gross per 24 hour  Intake   1084 ml  Output    626 ml  Net    458 ml   Filed Weights   03/13/15 0500 03/14/15 0346 03/15/15 0620  Weight: 182 lb 3.2 oz (82.645 kg) 195 lb 1.7 oz (88.5 kg) 179 lb 9 oz (81.448 kg)    PHYSICAL EXAM Filed Vitals:   03/14/15 1432 03/14/15 1753 03/14/15 2047 03/15/15 0620  BP: 104/78 127/60 113/59 137/60  Pulse: 61 71 54 118  Temp: 99.5 F (37.5 C)  98 F (36.7 C) 97.5 F (36.4 C)  TempSrc: Oral  Oral Oral  Resp: 18  18 18   Height:      Weight:    179 lb 9 oz (81.448 kg)  SpO2: 97%  96% 97%   General: Alert, oriented x3, no  distress Head: no evidence of trauma, PERRL, EOMI, no exophtalmos or lid lag, no myxedema, no xanthelasma; normal ears, nose and oropharynx Neck: normal jugular venous pulsations and no hepatojugular reflux; brisk carotid pulses without delay and no carotid bruits Chest: clear to auscultation, no signs of consolidation by percussion or palpation, normal fremitus, symmetrical and full respiratory excursions Cardiovascular: normal position and quality of the apical impulse, irregular rhythm, normal first and second heart sounds, no rubs or gallops, no murmur Abdomen: no tenderness or distention, no masses by palpation, no abnormal pulsatility or arterial bruits, normal bowel sounds, no hepatosplenomegaly Extremities: no clubbing, cyanosis or edema; 2+ radial, ulnar and brachial pulses bilaterally; 2+ right femoral, posterior tibial and dorsalis pedis pulses; 2+ left femoral, posterior tibial and dorsalis pedis pulses; no subclavian or femoral bruits Neurological: grossly nonfocal  LABS  CBC  Recent Labs  03/13/15 0350 03/14/15 0338  WBC 8.6 5.1  HGB 10.5* 10.7*  HCT 34.7* 36.3  MCV 78.5 78.2  PLT 167 026   Basic Metabolic Panel  Recent Labs  03/13/15 0350  NA 139  K 3.9  CL 96*  CO2 33*  GLUCOSE 123*  BUN 15  CREATININE 1.29*  CALCIUM 9.4   Liver Function Tests No results for input(s): AST, ALT, ALKPHOS, BILITOT, PROT, ALBUMIN in the last 72 hours.  Radiology Studies Imaging results have been reviewed and No results found.  TELE Atrial flutter with variable AV block, 80s   ASSESSMENT AND PLAN  1. Atrial fibrillation with RVR, now atrial flutter with controlled rate On complicated rate control regimen due to tenuous BP, but now fairly consistent control. Continue diltiazem 360 mg daily, metoprolol 12.5 mg BID, amiodarone 400 mg daily and digoxin 0.125 mg daily. In a couple of weeks, we should be able to stop digoxin and decrease amiodarone dose. There is a chance she  will convert to NSR in the near future. Unfortunately, not a candidate for anticoagulation due to recent GI bleeding/cecal AVMs. ASA only  2. Acute on chronic diastolic heart failure, resolved Weight lower despite holding diuretics. Educate re: daily weight monitoring and dose torsemide only when weight >180 lb.  3. HTN Lisinopril and amlodipine stopped to allow rate control meds  4. Hypothyroidism Good chance levothyroxine dose will require adjustment in the near future after resumption of amiodarone  5. CAD Asymptomatic. Stent RCA 2 years ago.  Compliance with meds has been an issue in the past and may be a serious impediment to her prognosis.  Sanda Klein, MD, [facility] [phone] office [phone] pager 03/15/2015 8:40 AM

## 2015-03-15 NOTE — Discharge Instructions (Signed)

## 2015-03-15 NOTE — Discharge Summary (Signed)
Physician Discharge Summary  [name] VFI:[id number] DOB: 1941-05-28 DOA: 03/07/2015  PCP: Sharon Fraise, MD  Admit date: 03/07/2015 Discharge date: 03/15/2015  Time spent: 35 minutes  Recommendations for Outpatient Follow-up:  daily weight monitoring and dose torsemide only when weight >180 lb Cbc, BMP 1 week  Discharge Diagnoses:  Principal Problem:   Acute on chronic diastolic CHF (congestive heart failure), NYHA class 3 Active Problems:   Obesity   Peripheral edema   CAD (coronary artery disease), native coronary artery   Depression with anxiety   A-fib   Heart failure   Bilateral edema of lower extremity   Rapid atrial fibrillation   Thyroid activity decreased   Chronic atrial fibrillation   Acute diastolic CHF (congestive heart failure), NYHA class 3   Discharge Condition: improved  Diet recommendation: cardiac  Filed Weights   03/13/15 0500 03/14/15 0346 03/15/15 0620  Weight: 82.645 kg (182 lb 3.2 oz) 88.5 kg (195 lb 1.7 oz) 81.448 kg (179 lb 9 oz)    History of present illness:  Sharon Compton is a 74 y.o. female, with coronary artery disease status post stenting, atrial fibrillation (chadsvasc score 4), history of GI bleeding, COPD, fibromyalgia, and diastolic heart failure. She was hospitalized by cardiology in early August 2016. At the time she had an elevated troponin 0.43, underwent cardiac cath, was found to have nonobstructive CAD, and placed on medical therapy (metoprolol, amlodipine, Imdur). Since being discharged she states she never felt improved. She has developed worsening swelling of her lower extremities, progressive dyspnea on exertion, and significant orthopnea. She is unable to sleep due to difficulty breathing. She mentions several episodes of presyncope and dizziness. She attributes these episodes to her new medications. Despite these symptoms and she reports that she kept taking these medications until this morning. She saw her primary care  physician on September 9 (note in Epic) she was advised to come to the ER on that day but decided she did not want to comment on Monday. There is some confusion about how much Lasix she has been taking recently. She denies chest pain. She is having significant shortness of breath. While she denies pain, she states it feels like someone is holding her heart in their hands and will not let it beat  Hospital Course:  Acute on chronic diastolic CHF (congestive heart failure), NYHA class 3: secondary to noncompliance, rapid AF.  -down 8L  CR stable - not a candidate for long term anticoagulation -added BB -oral amiodarone -non compliance an issues -needs close cardiology follow up   Obesity  Peripheral edema  CAD (coronary artery disease), native coronary artery  Depression with anxiety  Atrial fib with RVR. Changed to amiodarone due to hypotension on cardizem. Lisinopril stopped. Still tachycardic -digoxin Fibromyalgia  PT eval recommends home health- ordered but patient refused  Procedures:    Consultations:  cards  Discharge Exam: Filed Vitals:   03/15/15 0620  BP: 137/60  Pulse: 118  Temp: 97.5 F (36.4 C)  Resp: 18    General: awake, NAD- anxious to go home  Discharge Instructions   Discharge Instructions    (HEART FAILURE PATIENTS) Call MD:  Anytime you have any of the following symptoms: 1) 3 pound weight gain in 24 hours or 5 pounds in 1 week 2) shortness of breath, with or without a dry hacking cough 3) swelling in the hands, feet or stomach 4) if you have to sleep on extra pillows at night in order to breathe.  Complete by:  As directed      Diet - low sodium heart healthy    Complete by:  As directed      Discharge instructions    Complete by:  As directed   daily weight monitoring and dose torsemide only when weight >180 lb     Increase activity slowly    Complete by:  As directed           Current Discharge Medication List    START taking  these medications   Details  amiodarone (PACERONE) 200 MG tablet Take 1 tablet (200 mg total) by mouth 2 (two) times daily. Qty: 60 tablet, Refills: 0    digoxin (LANOXIN) 0.125 MG tablet Take 1 tablet (0.125 mg total) by mouth daily. Qty: 30 tablet, Refills: 0    diltiazem (CARDIZEM CD) 360 MG 24 hr capsule Take 1 capsule (360 mg total) by mouth daily. Qty: 30 capsule, Refills: 0    lactulose (CHRONULAC) 10 GM/15ML solution Take 30 mLs (20 g total) by mouth 2 (two) times daily as needed for mild constipation. Qty: 240 mL, Refills: 0    levofloxacin (LEVAQUIN) 750 MG tablet Take 1 tablet (750 mg total) by mouth every other day. Qty: 2 tablet, Refills: 0    potassium chloride SA (K-DUR,KLOR-CON) 20 MEQ tablet Take 1 tablet (20 mEq total) by mouth daily. Qty: 30 tablet, Refills: 0    torsemide (DEMADEX) 20 MG tablet daily weight monitoring and dose torsemide only when weight >180 lb Qty: 10 tablet, Refills: 0      CONTINUE these medications which have CHANGED   Details  metoprolol tartrate (LOPRESSOR) 25 MG tablet Take 0.5 tablets (12.5 mg total) by mouth 2 (two) times daily. Qty: 60 tablet, Refills: 0      CONTINUE these medications which have NOT CHANGED   Details  ALPRAZolam (XANAX) 1 MG tablet Take 1 tablet (1 mg total) by mouth 4 (four) times daily. Take 1 tablet 1 hour before procedure as needed for anxiety Qty: 120 tablet, Refills: 0    aspirin EC 81 MG tablet Take 1 tablet (81 mg total) by mouth daily. HOLD ASPIRIN FOR 1 WEEK GIVEN BLEEDING; THEN OK TO RESUME    iron polysaccharides (NIFEREX) 150 MG capsule Take 1 capsule (150 mg total) by mouth daily. Qty: 30 capsule, Refills: 5    nitroGLYCERIN (NITROSTAT) 0.4 MG SL tablet Place 1 tablet (0.4 mg total) under the tongue every 5 (five) minutes as needed for chest pain. Qty: 30 tablet, Refills: 2   Associated Diagnoses: Hx of right coronary artery stent placement    pantoprazole (PROTONIX) 40 MG tablet Take 1 tablet  (40 mg total) by mouth daily. Qty: 90 tablet, Refills: 1   Associated Diagnoses: Gastroesophageal reflux disease, esophagitis presence not specified    SYNTHROID 50 MCG tablet 1 po every other day Qty: 90 tablet, Refills: 1   Associated Diagnoses: Hypothyroidism, unspecified hypothyroidism type      STOP taking these medications     amLODipine (NORVASC) 5 MG tablet      Black Cohosh-SoyIsoflav-Magnol (ESTROVEN MENOPAUSE RELIEF PO)      isosorbide mononitrate (IMDUR) 30 MG 24 hr tablet      lisinopril (PRINIVIL,ZESTRIL) 40 MG tablet      naproxen sodium (ANAPROX) 220 MG tablet        Allergies  Allergen Reactions  . Iohexol Shortness Of Breath    PT STATES CONTRAST ALLERGY CAUSED SHORTNESS OF BREATH IN THE 70'S  .  Dye Fdc Red [Red Dye] Other (See Comments)    Pt.states she passed out  . Sulfa Antibiotics Itching  . Codeine Itching and Palpitations   Follow-up Information    Follow up with Herminio Commons, MD On 04/04/2015.   Specialty:  Cardiology   Why:  4:15 pm (cardiology follow-up)   Contact information:   [address] [place] [address] [phone]        The results of significant diagnostics from this hospitalization (including imaging, microbiology, ancillary and laboratory) are listed below for reference.    Significant Diagnostic Studies: Dg Chest Port 1 View  03/07/2015   CLINICAL DATA:  Atrial fibrillation, shortness of breath.  EXAM: PORTABLE CHEST - 1 VIEW  COMPARISON:  01/21/2015  FINDINGS: There is a moderate right pleural effusion and small left pleural effusion. Heart is borderline in size. Mild vascular congestion. Bibasilar atelectasis.  IMPRESSION: Bilateral pleural effusions, right greater than left. Bibasilar atelectasis.  Mild vascular congestion.   Electronically Signed   By: Rolm Baptise M.D.   On: 03/07/2015 13:26    Microbiology: Recent Results (from the past 240 hour(s))  MRSA PCR Screening     Status: None   Collection  Time: 03/07/15 10:51 PM  Result Value Ref Range Status   MRSA by PCR NEGATIVE NEGATIVE Final    Comment:        The GeneXpert MRSA Assay (FDA approved for NASAL specimens only), is one component of a comprehensive MRSA colonization surveillance program. It is not intended to diagnose MRSA infection nor to guide or monitor treatment for MRSA infections.      Labs: Basic Metabolic Panel:  Recent Labs Lab 03/09/15 0420 03/10/15 0316 03/11/15 0348 03/13/15 0350  NA 136 138 139 139  K 4.0 4.1 4.4 3.9  CL 97* 95* 93* 96*  CO2 31 36* 36* 33*  GLUCOSE 149* 133* 87 123*  BUN 8 12 13 15   CREATININE 1.09* 1.18* 1.33* 1.29*  CALCIUM 8.6* 8.5* 8.7* 9.4   Liver Function Tests: No results for input(s): AST, ALT, ALKPHOS, BILITOT, PROT, ALBUMIN in the last 168 hours. No results for input(s): LIPASE, AMYLASE in the last 168 hours. No results for input(s): AMMONIA in the last 168 hours. CBC:  Recent Labs Lab 03/10/15 0316 03/11/15 0348 03/12/15 0825 03/13/15 0350 03/14/15 0338  WBC 6.1 6.6 6.2 8.6 5.1  HGB 9.2* 9.5* 10.3* 10.5* 10.7*  HCT 32.5* 32.0* 34.9* 34.7* 36.3  MCV 80.4 80.6 78.6 78.5 78.2  PLT 188 182 193 167 163   Cardiac Enzymes: No results for input(s): CKTOTAL, CKMB, CKMBINDEX, TROPONINI in the last 168 hours. BNP: BNP (last 3 results)  Recent Labs  01/21/15 0830 03/07/15 1210  BNP 243.0* 390.3*    ProBNP (last 3 results) No results for input(s): PROBNP in the last 8760 hours.  CBG:  Recent Labs Lab 03/12/15 1608  GLUCAP 103*       Signed:  VANN, JESSICA  [facility] 03/15/2015, 10:37 AM

## 2015-03-15 NOTE — Progress Notes (Signed)
CSW notified by patient's nurse that patient is d/c'd home today and is requesting assistance with transportation through [place]. Transportation. CSW spoke with patient who indicated that she lives at home with her husband who is of poor health and no longer drives. She does not have anyone to pick her up and get her hom.  CSW attempted to arrange transportation with [place] without success as patient has Medicaid.  CSW contacted transportation at [place]- patient has been receiving transportion through the service. Arrangements were made for patient to be picked up around 12:30 pm today via [employer] service.  Notified patient and her nurse of above. No further CSW needs identified. CSW signing off.  Lorie Phenix. Pauline Good, [place]  (coverage)

## 2015-03-17 ENCOUNTER — Encounter: Payer: Self-pay | Admitting: Nurse Practitioner

## 2015-03-17 ENCOUNTER — Ambulatory Visit (INDEPENDENT_AMBULATORY_CARE_PROVIDER_SITE_OTHER): Payer: Medicare Other | Admitting: Nurse Practitioner

## 2015-03-17 VITALS — BP 127/65 | HR 66 | Temp 96.9°F | Ht 60.0 in | Wt 182.0 lb

## 2015-03-17 DIAGNOSIS — F411 Generalized anxiety disorder: Secondary | ICD-10-CM

## 2015-03-17 DIAGNOSIS — Z09 Encounter for follow-up examination after completed treatment for conditions other than malignant neoplasm: Secondary | ICD-10-CM

## 2015-03-17 DIAGNOSIS — I25700 Atherosclerosis of coronary artery bypass graft(s), unspecified, with unstable angina pectoris: Secondary | ICD-10-CM

## 2015-03-17 MED ORDER — ALPRAZOLAM 1 MG PO TABS: 1.0000 mg | ORAL_TABLET | Freq: Four times a day (QID) | ORAL | Status: DC

## 2015-03-17 NOTE — Progress Notes (Signed)
   Subjective:    Patient ID: Sharon Compton, female    DOB: 1940-08-17, 74 y.o.   MRN: [id number]  HPI: Pt being seen to today for f/u after hospitalization. Reports she was in hospital for about 9 days for uncontrolled a-fib and CHF. States she feel better after being discharged but is still weak. Able to complete ADLs without difficulty.   *States she is out of her Xanax and wants it refilled because she missed her appt with Dr. Livia Snellen because she was in the hospital.   Review of Systems  Respiratory: Negative.   Cardiovascular: Negative.   Musculoskeletal: Negative.   Neurological: Positive for weakness.       Objective:   Physical Exam  Constitutional: She is oriented to person, place, and time. She appears well-developed and well-nourished.  HENT:  Head: Normocephalic.  Neck: Normal range of motion.  Cardiovascular: Normal rate, regular rhythm, normal heart sounds and intact distal pulses.   Pulmonary/Chest: Effort normal and breath sounds normal.  Neurological: She is alert and oriented to person, place, and time. She has normal reflexes.  Skin: Skin is warm and dry.  Psychiatric: She has a normal mood and affect.    BP 127/65 mmHg  Pulse 66  Temp(Src) 96.9 F (36.1 C) (Oral)  Ht 5' (1.524 m)  Wt 182 lb (82.555 kg)  BMI 35.54 kg/m2       Assessment & Plan:  1. Generalized anxiety disorder Stress management - ALPRAZolam (XANAX) 1 MG tablet; Take 1 tablet (1 mg total) by mouth 4 (four) times daily.  Dispense: 120 tablet; Refill: 0  2. Hospital discharge follow-up Hospital record reviewed Continue fluid pills  Mary-Margaret Hassell Done, FNP

## 2015-03-17 NOTE — Patient Instructions (Signed)
Generalized Anxiety Disorder Generalized anxiety disorder (GAD) is a mental disorder. It interferes with life functions, including relationships, work, and school. GAD is different from normal anxiety, which everyone experiences at some point in their lives in response to specific life events and activities. Normal anxiety actually helps us prepare for and get through these life events and activities. Normal anxiety goes away after the event or activity is over.  GAD causes anxiety that is not necessarily related to specific events or activities. It also causes excess anxiety in proportion to specific events or activities. The anxiety associated with GAD is also difficult to control. GAD can vary from mild to severe. People with severe GAD can have intense waves of anxiety with physical symptoms (panic attacks).  SYMPTOMS The anxiety and worry associated with GAD are difficult to control. This anxiety and worry are related to many life events and activities and also occur more days than not for 6 months or longer. People with GAD also have three or more of the following symptoms (one or more in children):  Restlessness.   Fatigue.  Difficulty concentrating.   Irritability.  Muscle tension.  Difficulty sleeping or unsatisfying sleep. DIAGNOSIS GAD is diagnosed through an assessment by your health care provider. Your health care provider will ask you questions aboutyour mood,physical symptoms, and events in your life. Your health care provider may ask you about your medical history and use of alcohol or drugs, including prescription medicines. Your health care provider may also do a physical exam and blood tests. Certain medical conditions and the use of certain substances can cause symptoms similar to those associated with GAD. Your health care provider may refer you to a mental health specialist for further evaluation. TREATMENT The following therapies are usually used to treat GAD:    Medication. Antidepressant medication usually is prescribed for long-term daily control. Antianxiety medicines may be added in severe cases, especially when panic attacks occur.   Talk therapy (psychotherapy). Certain types of talk therapy can be helpful in treating GAD by providing support, education, and guidance. A form of talk therapy called cognitive behavioral therapy can teach you healthy ways to think about and react to daily life events and activities.  Stress managementtechniques. These include yoga, meditation, and exercise and can be very helpful when they are practiced regularly. A mental health specialist can help determine which treatment is best for you. Some people see improvement with one therapy. However, other people require a combination of therapies. Document Released: 10/06/2012 Document Revised: 10/26/2013 Document Reviewed: 10/06/2012 ExitCare Patient Information 2015 [employer]. This information is not intended to replace advice given to you by your health care provider. Make sure you discuss any questions you have with your health care provider.  

## 2015-04-04 ENCOUNTER — Telehealth: Payer: Self-pay | Admitting: Cardiovascular Disease

## 2015-04-04 ENCOUNTER — Encounter: Payer: Self-pay | Admitting: Cardiovascular Disease

## 2015-04-04 ENCOUNTER — Ambulatory Visit (INDEPENDENT_AMBULATORY_CARE_PROVIDER_SITE_OTHER): Payer: Medicare Other | Admitting: Cardiovascular Disease

## 2015-04-04 ENCOUNTER — Encounter: Payer: Self-pay | Admitting: *Deleted

## 2015-04-04 VITALS — BP 122/78 | HR 57 | Ht 60.0 in | Wt 180.0 lb

## 2015-04-04 DIAGNOSIS — R5383 Other fatigue: Secondary | ICD-10-CM

## 2015-04-04 DIAGNOSIS — Z8719 Personal history of other diseases of the digestive system: Secondary | ICD-10-CM

## 2015-04-04 DIAGNOSIS — I5032 Chronic diastolic (congestive) heart failure: Secondary | ICD-10-CM

## 2015-04-04 DIAGNOSIS — Z87898 Personal history of other specified conditions: Secondary | ICD-10-CM

## 2015-04-04 DIAGNOSIS — R079 Chest pain, unspecified: Secondary | ICD-10-CM | POA: Diagnosis not present

## 2015-04-04 DIAGNOSIS — I25118 Atherosclerotic heart disease of native coronary artery with other forms of angina pectoris: Secondary | ICD-10-CM | POA: Diagnosis not present

## 2015-04-04 DIAGNOSIS — E8770 Fluid overload, unspecified: Secondary | ICD-10-CM

## 2015-04-04 DIAGNOSIS — I25700 Atherosclerosis of coronary artery bypass graft(s), unspecified, with unstable angina pectoris: Secondary | ICD-10-CM

## 2015-04-04 DIAGNOSIS — R609 Edema, unspecified: Secondary | ICD-10-CM

## 2015-04-04 DIAGNOSIS — I4891 Unspecified atrial fibrillation: Secondary | ICD-10-CM

## 2015-04-04 DIAGNOSIS — E039 Hypothyroidism, unspecified: Secondary | ICD-10-CM | POA: Diagnosis not present

## 2015-04-04 DIAGNOSIS — Z9289 Personal history of other medical treatment: Secondary | ICD-10-CM

## 2015-04-04 DIAGNOSIS — I10 Essential (primary) hypertension: Secondary | ICD-10-CM

## 2015-04-04 MED ORDER — AMIODARONE HCL 200 MG PO TABS: 200.0000 mg | ORAL_TABLET | Freq: Every day | ORAL | Status: DC

## 2015-04-04 MED ORDER — SYNTHROID 50 MCG PO TABS: ORAL_TABLET | ORAL | Status: DC

## 2015-04-04 NOTE — Telephone Encounter (Signed)
lexiscan dx chest pain at [facility] Oct 13 th arrive at 8:00

## 2015-04-04 NOTE — Patient Instructions (Signed)
Your physician recommends that you schedule a follow-up appointment in: 2 months with Dr. Bronson Ing  Your physician has recommended you make the following change in your medication:   START ASPIRIN 81 MG DAILY  STOP TAKING DIGOXIN   DECREASE AMIODARONE 200 MG DAILY  START SYNTHROID [place] physician has requested that you have a lexiscan myoview. For further information please visit [email or web address]. Please follow instruction sheet, as given.  Thank you for choosing [facility]!!
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
## 2015-04-05 NOTE — Telephone Encounter (Signed)
Pt has MCR, MCD.  No precert required.

## 2015-04-06 ENCOUNTER — Telehealth: Payer: Self-pay | Admitting: Cardiovascular Disease

## 2015-04-06 NOTE — Telephone Encounter (Signed)
Stated she had this done before & absolutely does not want to have this done again.  Scared to have done due to the way if makes her feel.  Feels like if she has to have again that "it will kill me."  Explained the reasons as to why MD wanted this done and she again declined.  2 month follow up scheduled for 06/13/15.

## 2015-04-06 NOTE — Telephone Encounter (Signed)
Dr. Bronson Ing aware.  Stress test will be cancelled.  Keep follow up as planned.

## 2015-04-06 NOTE — Telephone Encounter (Signed)
Sharon Compton called the office stating that she wants to cancel her appointment for her stress test at [facility]. States that she recently had a heart attack and does not understand why she is being Sent to have this test. Please call patient.

## 2015-04-07 ENCOUNTER — Encounter (HOSPITAL_COMMUNITY): Payer: Medicare Other

## 2015-04-07 ENCOUNTER — Telehealth: Payer: Self-pay | Admitting: Cardiovascular Disease

## 2015-04-07 ENCOUNTER — Inpatient Hospital Stay (HOSPITAL_COMMUNITY): Admission: RE | Admit: 2015-04-07 | Payer: Medicare Other | Source: Ambulatory Visit

## 2015-04-07 NOTE — Telephone Encounter (Signed)
Sharon Compton is calling wanting to know why her medications have been switched?

## 2015-04-08 NOTE — Telephone Encounter (Signed)
Pt called back to verify LOV medication changes. Spent 10 minutes on phone with pt explaining medication/changes. Pt verbalized understanding but will not proceed with stress test

## 2015-04-08 NOTE — Telephone Encounter (Signed)
Spoke with husband to get the name of the medication that's in question since he said patient was asleep. Husband was unsure of the medication. Nurse advised husband to have patient call office when she gets up to give the name of the medication she is talking about.

## 2015-04-11 ENCOUNTER — Telehealth: Payer: Self-pay | Admitting: Cardiovascular Disease

## 2015-04-11 NOTE — Telephone Encounter (Signed)
Have her call PCP as dose was changed by them for refill please.

## 2015-04-11 NOTE — Telephone Encounter (Signed)
Looks like dose was reduced to 12.5 mg bid by Eulogio Bear, an internal medicine physician on 03/15/15. Ok to refill for this dose? Please advise. Thanks, MI

## 2015-04-11 NOTE — Telephone Encounter (Signed)
Patient calls stating that she needs refill on Metoprol 25 mg   [facility] [place] [place]

## 2015-04-12 ENCOUNTER — Telehealth: Payer: Self-pay | Admitting: Family Medicine

## 2015-04-12 DIAGNOSIS — F411 Generalized anxiety disorder: Secondary | ICD-10-CM

## 2015-04-12 MED ORDER — ALPRAZOLAM 1 MG PO TABS: 1.0000 mg | ORAL_TABLET | Freq: Four times a day (QID) | ORAL | Status: DC

## 2015-04-12 MED ORDER — METOPROLOL TARTRATE 25 MG PO TABS: 12.5000 mg | ORAL_TABLET | Freq: Two times a day (BID) | ORAL | Status: DC

## 2015-04-12 MED ORDER — DILTIAZEM HCL ER COATED BEADS 360 MG PO CP24: 360.0000 mg | ORAL_CAPSULE | Freq: Every day | ORAL | Status: DC

## 2015-04-12 NOTE — Telephone Encounter (Signed)
Spoke with patient and she is aware to call pcp.

## 2015-04-12 NOTE — Telephone Encounter (Signed)
Decreasing dose of xanax gradually only getting 100 tablets so stretch them out- would like to get her down to at least 1 TID

## 2015-04-19 ENCOUNTER — Encounter: Payer: Self-pay | Admitting: Family Medicine

## 2015-04-19 ENCOUNTER — Ambulatory Visit (INDEPENDENT_AMBULATORY_CARE_PROVIDER_SITE_OTHER): Payer: Medicare Other | Admitting: Family Medicine

## 2015-04-19 ENCOUNTER — Other Ambulatory Visit: Payer: Self-pay | Admitting: Nurse Practitioner

## 2015-04-19 VITALS — BP 124/66 | HR 67 | Temp 97.0°F | Ht 60.0 in | Wt 179.4 lb

## 2015-04-19 DIAGNOSIS — F411 Generalized anxiety disorder: Secondary | ICD-10-CM | POA: Diagnosis not present

## 2015-04-19 DIAGNOSIS — I10 Essential (primary) hypertension: Secondary | ICD-10-CM | POA: Diagnosis not present

## 2015-04-19 DIAGNOSIS — Z23 Encounter for immunization: Secondary | ICD-10-CM

## 2015-04-19 DIAGNOSIS — N959 Unspecified menopausal and perimenopausal disorder: Secondary | ICD-10-CM

## 2015-04-19 DIAGNOSIS — I4891 Unspecified atrial fibrillation: Secondary | ICD-10-CM | POA: Diagnosis not present

## 2015-04-19 NOTE — Addendum Note (Signed)
Addended by: Claretta Fraise on: 04/19/2015 11:07 PM   Modules accepted: Miquel Dunn

## 2015-04-19 NOTE — Progress Notes (Signed)
Subjective:  Patient ID: Sharon Compton, female    DOB: 1941-01-30  Age: 74 y.o. MRN: [id number]  CC: Atrial Fibrillation; Coronary Artery Disease; Hypertension; and GAD   HPI Sharon Compton presents for Refill of alprazolam. She states it keeps her calm. Has been taking it for years. Unwilling to consider taper. Rejects possibility of weaning and trial of any other agent. Patient was recently hospitalized for CAD. States, I was sent home to die."    History Sharon Compton has a past medical history of Hypertension; Anxiety and depression; Asthma; Fibromyalgia; History of pneumonia; Peripheral edema; Peptic ulcer disease; Hiatal hernia; GERD (gastroesophageal reflux disease); A-fib ([place]); AVM (arteriovenous malformation) of colon (10/01/2011); Diverticular disease (10/01/2011); MI, acute, non ST segment elevation (HCC) (10/02/2011); GI bleed; COPD (chronic obstructive pulmonary disease) ([place]); Lymphedema; and CAD (coronary artery disease) (02/2013).   She has past surgical history that includes Abdominal hysterectomy (age 55); Cholecystectomy; Abdominal exploration surgery; abd tumor removed; Knee surgery; bladder stent; Esophagogastroduodenoscopy (08/24/2011); Colonoscopy (08/25/2011); Colonoscopy (10/03/2011); Colonoscopy (10/04/2011); transthoracic echocardiogram (09/2011); Cardiovascular stress test (09/2011); Coronary angioplasty (02/2013); Percutaneous coronary stent intervention (pci-s) (02/2013); left and right heart catheterization with coronary angiogram (N/A, 03/03/2013); and Cardiac catheterization (N/A, 01/24/2015).   Her family history includes Cancer in her father; Coronary artery disease in her mother; Diabetes in her mother; Heart failure in her mother; Hypertension in her mother. There is no history of Colon cancer.She reports that she quit smoking about 19 years ago. Her smoking use included Cigarettes. She has a 15 pack-year smoking history. She has never used smokeless tobacco. She reports that she does not  drink alcohol or use illicit drugs.  Outpatient Prescriptions Prior to Visit  Medication Sig Dispense Refill  . ALPRAZolam (XANAX) 1 MG tablet Take 1 tablet (1 mg total) by mouth 4 (four) times daily. 100 tablet 0  . amiodarone (PACERONE) 200 MG tablet Take 1 tablet (200 mg total) by mouth daily. 90 tablet 3  . aspirin 81 MG tablet Take 81 mg by mouth daily.    [name] diltiazem (CARDIZEM CD) 360 MG 24 hr capsule Take 1 capsule (360 mg total) by mouth daily. 30 capsule 0  . iron polysaccharides (NIFEREX) 150 MG capsule Take 1 capsule (150 mg total) by mouth daily. 30 capsule 5  . metoprolol tartrate (LOPRESSOR) 25 MG tablet Take 0.5 tablets (12.5 mg total) by mouth 2 (two) times daily. 60 tablet 0  . potassium chloride SA (K-DUR,KLOR-CON) 20 MEQ tablet Take 1 tablet (20 mEq total) by mouth daily. 30 tablet 0  . rosuvastatin (CRESTOR) 40 MG tablet Take 40 mg by mouth daily.    [name] SYNTHROID 50 MCG tablet Take 1 tab daily 90 tablet 1  . torsemide (DEMADEX) 20 MG tablet daily weight monitoring and dose torsemide only when weight >180 lb (Patient not taking: Reported on 04/19/2015) 10 tablet 0   No facility-administered medications prior to visit.    ROS Review of Systems  Constitutional: Negative for fever, activity change and appetite change.  HENT: Negative for congestion, rhinorrhea and sore throat.   Eyes: Negative for pain and visual disturbance.  Respiratory: Negative for cough and shortness of breath.   Gastrointestinal: Negative for nausea and abdominal pain.  Musculoskeletal: Negative for myalgias and arthralgias.  Psychiatric/Behavioral: Positive for dysphoric mood, decreased concentration and agitation. The patient is nervous/anxious.     Objective:  BP 124/66 mmHg  Pulse 67  Temp(Src) 97 F (36.1 C) (Oral)  Ht 5' (1.524 m)  Wt 179  lb 6.4 oz (81.375 kg)  BMI 35.04 kg/m2  BP Readings from Last 3 Encounters:  04/19/15 124/66  04/04/15 122/78  03/17/15 127/65    Wt Readings  from Last 3 Encounters:  04/19/15 179 lb 6.4 oz (81.375 kg)  04/04/15 180 lb (81.647 kg)  03/17/15 182 lb (82.555 kg)     Physical Exam  Constitutional: She is oriented to person, place, and time. She appears well-developed and well-nourished.  HENT:  Head: Normocephalic.  Eyes: Pupils are equal, round, and reactive to light.  Neck: Normal range of motion.  Cardiovascular: Normal rate and regular rhythm.   Pulmonary/Chest: Effort normal and breath sounds normal.  Musculoskeletal: Normal range of motion.  Neurological: She is alert and oriented to person, place, and time.  Psychiatric: Her speech is normal. Her affect is angry and inappropriate. She is agitated and aggressive. She expresses impulsivity and inappropriate judgment.    Lab Results  Component Value Date   HGBA1C 5.5 10/03/2011    Lab Results  Component Value Date   WBC 5.1 03/14/2015   HGB 10.7* 03/14/2015   HCT 36.3 03/14/2015   PLT 163 03/14/2015   GLUCOSE 123* 03/13/2015   CHOL 284* 07/29/2014   TRIG 159* 07/29/2014   HDL 44 07/29/2014   LDLCALC 215* 03/19/2014   ALT 15 03/04/2015   AST 15 03/04/2015   NA 139 03/13/2015   K 3.9 03/13/2015   CL 96* 03/13/2015   CREATININE 1.29* 03/13/2015   BUN 15 03/13/2015   CO2 33* 03/13/2015   TSH 2.632 03/07/2015   INR 1.09 01/21/2015   HGBA1C 5.5 10/03/2011   MICROALBUR 11.4* 09/25/2012    Dg Chest Port 1 View  03/07/2015  CLINICAL DATA:  Atrial fibrillation, shortness of breath. EXAM: PORTABLE CHEST - 1 VIEW COMPARISON:  01/21/2015 FINDINGS: There is a moderate right pleural effusion and small left pleural effusion. Heart is borderline in size. Mild vascular congestion. Bibasilar atelectasis. IMPRESSION: Bilateral pleural effusions, right greater than left. Bibasilar atelectasis. Mild vascular congestion. Electronically Signed   By: Rolm Baptise M.D.   On: 03/07/2015 13:26    Assessment & Plan:   Sharon Compton was seen today for atrial fibrillation, coronary artery  disease, hypertension and gad.  Diagnoses and all orders for this visit:  Generalized anxiety disorder  Encounter for immunization  Atrial fibrillation, unspecified type ([place])  Essential hypertension  Other orders -     Flu Vaccine QUAD 36+ mos IM -     Pneumococcal conjugate vaccine 13-valent -     DG Bone Density   I am having Sharon Compton maintain her iron polysaccharides, potassium chloride SA, torsemide, rosuvastatin, aspirin, amiodarone, SYNTHROID, diltiazem, metoprolol tartrate, and ALPRAZolam.  No orders of the defined types were placed in this encounter.   30 minutes was spent with this patient more than half this was in discussion of her anxiety. She is unwilling to consider any option other than continuing her Xanax at the full dose. She is willing to switch doctors if necessary to get this medication. She is willing to write for it. However she is not willing to consider any option including other medication or seeing a psychiatrist. She will not consider a taper of the medicine. She becomes very aggressive and denegrating when discussion turns to dependence and potential addiction.  Follow-up: Return if symptoms worsen or fail to improve.  Claretta Fraise, M.D.

## 2015-04-26 ENCOUNTER — Ambulatory Visit (INDEPENDENT_AMBULATORY_CARE_PROVIDER_SITE_OTHER): Payer: Medicare Other

## 2015-04-26 ENCOUNTER — Ambulatory Visit (INDEPENDENT_AMBULATORY_CARE_PROVIDER_SITE_OTHER): Payer: Medicare Other | Admitting: Nurse Practitioner

## 2015-04-26 ENCOUNTER — Encounter (INDEPENDENT_AMBULATORY_CARE_PROVIDER_SITE_OTHER): Payer: Self-pay

## 2015-04-26 VITALS — BP 129/69 | HR 58 | Temp 97.6°F | Ht 60.0 in | Wt 179.0 lb

## 2015-04-26 DIAGNOSIS — S99921A Unspecified injury of right foot, initial encounter: Secondary | ICD-10-CM

## 2015-04-26 DIAGNOSIS — S9031XA Contusion of right foot, initial encounter: Secondary | ICD-10-CM

## 2015-04-26 NOTE — Progress Notes (Signed)
   Subjective:    Patient ID: Sharon Compton, female    DOB: June 08, 1941, 74 y.o.   MRN: [id number]  HPI Patient comes in stating that she dropped a crock pot on her right foot- painful to walk on and swollen.    Review of Systems  Constitutional: Negative.   HENT: Negative.   Respiratory: Negative.   Cardiovascular: Negative.   Genitourinary: Negative.   Psychiatric/Behavioral: Negative.   All other systems reviewed and are negative.      Objective:   Physical Exam  Constitutional: She is oriented to person, place, and time. She appears well-developed and well-nourished.  Cardiovascular: Normal rate, regular rhythm and normal heart sounds.   Pulmonary/Chest: Effort normal and breath sounds normal.  Musculoskeletal: She exhibits edema.  Dorsal surface of right foot edematous with echymosis- ender to touch  Neurological: She is alert and oriented to person, place, and time.  Skin: Skin is warm and dry.  Psychiatric: She has a normal mood and affect. Her behavior is normal. Judgment and thought content normal.   BP 129/69 mmHg  Pulse 58  Temp(Src) 97.6 F (36.4 C) (Oral)  Ht 5' (1.524 m)  Wt 179 lb (81.194 kg)  BMI 34.96 kg/m2    right foot xray- no fracture visible-Preliminary reading by Ronnald Collum, FNP  [facility]      Assessment & Plan:   1. Right foot injury, initial encounter   2. Contusion of right foot, initial encounter    Ice  Rest Wrap when walking Elevate when sitting RTO prn  Mary-Margaret Hassell Done, FNP

## 2015-04-26 NOTE — Patient Instructions (Signed)

## 2015-04-27 ENCOUNTER — Telehealth: Payer: Self-pay | Admitting: Cardiovascular Disease

## 2015-04-27 NOTE — Telephone Encounter (Signed)
Patient states that Dr. Loletha Grayer is to prescribe her "nerve pills". / tg

## 2015-04-29 NOTE — Telephone Encounter (Signed)
Stp and she states she takes her xanax 4 times a day and she said you shorted her 20 pills. She wants those 20 pills. Please advise.

## 2015-04-29 NOTE — Telephone Encounter (Signed)
Patient was told when given rx to cut back use to TID- so we only gave her 100 rather than 120. We also got message from another Dr. Lorenda Peck that they would be taking over her anxiety medications.

## 2015-04-29 NOTE — Telephone Encounter (Signed)
Pt is aware we aren't going to give her 120 anymore.

## 2015-04-29 NOTE — Telephone Encounter (Signed)
What is she talking about- who is Dr. Loletha Grayer - and does this mean her xanax?

## 2015-05-04 ENCOUNTER — Other Ambulatory Visit: Payer: Self-pay | Admitting: *Deleted

## 2015-05-04 MED ORDER — POTASSIUM CHLORIDE CRYS ER 20 MEQ PO TBCR: 20.0000 meq | EXTENDED_RELEASE_TABLET | Freq: Every day | ORAL | Status: DC

## 2015-05-12 ENCOUNTER — Other Ambulatory Visit: Payer: Self-pay | Admitting: Nurse Practitioner

## 2015-05-18 ENCOUNTER — Telehealth: Payer: Self-pay | Admitting: Nurse Practitioner

## 2015-05-18 ENCOUNTER — Other Ambulatory Visit: Payer: Self-pay | Admitting: Nurse Practitioner

## 2015-05-18 DIAGNOSIS — F411 Generalized anxiety disorder: Secondary | ICD-10-CM

## 2015-05-18 MED ORDER — ALPRAZOLAM 1 MG PO TABS: 1.0000 mg | ORAL_TABLET | Freq: Three times a day (TID) | ORAL | Status: DC | PRN

## 2015-05-18 NOTE — Telephone Encounter (Signed)
Medication called in patient aware

## 2015-05-18 NOTE — Telephone Encounter (Signed)
Patient would like refill of Xanax sent to [facility], please advise

## 2015-05-18 NOTE — Telephone Encounter (Signed)
I went ahead with a prescription for 3 times daily dosing. This is consistent with my discussion with her at her last office visit with me stating that she needs to start tapering her dose.

## 2015-05-18 NOTE — Telephone Encounter (Signed)
Last seen 04/19/15  Dr Livia Snellen  If approved route to nurse to call into [facility]

## 2015-06-13 ENCOUNTER — Ambulatory Visit: Payer: Medicare Other | Admitting: Cardiovascular Disease

## 2015-06-14 ENCOUNTER — Other Ambulatory Visit: Payer: Self-pay | Admitting: Family Medicine

## 2015-06-15 NOTE — Telephone Encounter (Signed)
Last seen 04/19/15  Dr Livia Snellen  If approved route to nurse to call into [facility]

## 2015-07-06 ENCOUNTER — Other Ambulatory Visit: Payer: Self-pay | Admitting: Nurse Practitioner

## 2015-07-13 ENCOUNTER — Other Ambulatory Visit: Payer: Self-pay | Admitting: Family Medicine

## 2015-07-14 NOTE — Telephone Encounter (Signed)
Last seen 04/19/15 Dr Livia Snellen   If approved route to nurse to call into [facility]

## 2015-07-15 NOTE — Telephone Encounter (Signed)
Authorize 30 days only. Then contact the patient letting them know that they will need an appointment before any further prescriptions can be sent in. 

## 2015-07-15 NOTE — Telephone Encounter (Signed)
Saw Dr. Livia Snellen for her anxiety last time

## 2015-07-15 NOTE — Telephone Encounter (Signed)
Please review and advise.

## 2015-07-16 ENCOUNTER — Other Ambulatory Visit: Payer: Self-pay | Admitting: Nurse Practitioner

## 2015-07-16 NOTE — Telephone Encounter (Signed)
rx called in and pt aware that she has to be seen to get further refills

## 2015-07-18 NOTE — Telephone Encounter (Signed)
x

## 2015-07-25 NOTE — Telephone Encounter (Signed)
Pt notified she will need to be seen for refill of Xanax Will call back to schedule

## 2015-08-09 ENCOUNTER — Encounter: Payer: Self-pay | Admitting: Family Medicine

## 2015-08-09 ENCOUNTER — Other Ambulatory Visit: Payer: Self-pay | Admitting: *Deleted

## 2015-08-09 ENCOUNTER — Ambulatory Visit (INDEPENDENT_AMBULATORY_CARE_PROVIDER_SITE_OTHER): Payer: Medicare Other | Admitting: Family Medicine

## 2015-08-09 VITALS — BP 178/84 | HR 64 | Temp 98.2°F | Ht 60.0 in | Wt 189.6 lb

## 2015-08-09 DIAGNOSIS — F1320 Sedative, hypnotic or anxiolytic dependence, uncomplicated: Secondary | ICD-10-CM | POA: Diagnosis not present

## 2015-08-09 DIAGNOSIS — F411 Generalized anxiety disorder: Secondary | ICD-10-CM | POA: Diagnosis not present

## 2015-08-09 DIAGNOSIS — K439 Ventral hernia without obstruction or gangrene: Secondary | ICD-10-CM

## 2015-08-09 DIAGNOSIS — J209 Acute bronchitis, unspecified: Secondary | ICD-10-CM

## 2015-08-09 DIAGNOSIS — I2510 Atherosclerotic heart disease of native coronary artery without angina pectoris: Secondary | ICD-10-CM | POA: Diagnosis not present

## 2015-08-09 DIAGNOSIS — I10 Essential (primary) hypertension: Secondary | ICD-10-CM | POA: Diagnosis not present

## 2015-08-09 MED ORDER — GUAIFENESIN-CODEINE 100-10 MG/5ML PO SYRP: 5.0000 mL | ORAL_SOLUTION | ORAL | Status: DC | PRN

## 2015-08-09 MED ORDER — DILTIAZEM HCL ER COATED BEADS 360 MG PO CP24: 360.0000 mg | ORAL_CAPSULE | Freq: Every day | ORAL | Status: DC

## 2015-08-09 MED ORDER — AMOXICILLIN-POT CLAVULANATE 875-125 MG PO TABS: 1.0000 | ORAL_TABLET | Freq: Two times a day (BID) | ORAL | Status: DC

## 2015-08-09 MED ORDER — ALPRAZOLAM 1 MG PO TABS
1.0000 mg | ORAL_TABLET | Freq: Three times a day (TID) | ORAL | Status: DC | PRN
Start: 2015-08-09 — End: 2016-02-01

## 2015-08-09 NOTE — Progress Notes (Signed)
Subjective:  Patient ID: Sharon Compton, female    DOB: [birth year]  Age: 75 y.o. MRN: [id number]  CC: URI   HPI Sharon Compton presents for Patient presents with upper respiratory congestion. Rhinorrhea that is frequently purulent. There is moderate sore throat. Patient reports coughing frequently as well.-colored/purulent sputum noted. There is no fever no chills no sweats. The patient reports being short of breath. Onset was 4 days ago. Gradually worsening. COugh exacerbating her ventral hernia. Severe pain in anterior abd. Can not have surgery because she has a bad heart.Requests pain pills Needs nerve pill renewed also.   History Sharon Compton has a past medical history of Hypertension; Anxiety and depression; Asthma; Fibromyalgia; History of pneumonia; Peripheral edema; Peptic ulcer disease; Hiatal hernia; GERD (gastroesophageal reflux disease); A-fib ([place]); AVM (arteriovenous malformation) of colon (10/01/2011); Diverticular disease (10/01/2011); MI, acute, non ST segment elevation (HCC) (10/02/2011); GI bleed; COPD (chronic obstructive pulmonary disease) ([place]); Lymphedema; and CAD (coronary artery disease) (02/2013).   She has past surgical history that includes Abdominal hysterectomy (age 63); Cholecystectomy; Abdominal exploration surgery; abd tumor removed; Knee surgery; bladder stent; Esophagogastroduodenoscopy (08/24/2011); Colonoscopy (08/25/2011); Colonoscopy (10/03/2011); Colonoscopy (10/04/2011); transthoracic echocardiogram (09/2011); Cardiovascular stress test (09/2011); Coronary angioplasty (02/2013); Percutaneous coronary stent intervention (pci-s) (02/2013); left and right heart catheterization with coronary angiogram (N/A, 03/03/2013); and Cardiac catheterization (N/A, 01/24/2015).   Her family history includes Cancer in her father; Coronary artery disease in her mother; Diabetes in her mother; Heart failure in her mother; Hypertension in her mother. There is no history of Colon cancer.She reports that  she quit smoking about 20 years ago. Her smoking use included Cigarettes. She has a 15 pack-year smoking history. She has never used smokeless tobacco. She reports that she does not drink alcohol or use illicit drugs.    ROS Review of Systems  Constitutional: Negative for fever, chills, activity change and appetite change.  HENT: Positive for congestion, postnasal drip and rhinorrhea. Negative for ear discharge, ear pain, hearing loss, nosebleeds, sinus pressure, sneezing and trouble swallowing.   Respiratory: Positive for cough, shortness of breath and wheezing. Negative for chest tightness.   Cardiovascular: Negative for chest pain and palpitations.  Skin: Negative for rash.    Objective:  BP 178/84 mmHg  Pulse 64  Temp(Src) 98.2 F (36.8 C) (Oral)  Ht 5' (1.524 m)  Wt 189 lb 9.6 oz (86.002 kg)  BMI 37.03 kg/m2  SpO2 98%  BP Readings from Last 3 Encounters:  08/09/15 178/84  04/26/15 129/69  04/19/15 124/66    Wt Readings from Last 3 Encounters:  08/09/15 189 lb 9.6 oz (86.002 kg)  04/26/15 179 lb (81.194 kg)  04/19/15 179 lb 6.4 oz (81.375 kg)     Physical Exam  Constitutional: She appears well-developed and well-nourished.  HENT:  Head: Normocephalic and atraumatic.  Right Ear: Tympanic membrane and external ear normal. No decreased hearing is noted.  Left Ear: Tympanic membrane and external ear normal. No decreased hearing is noted.  Nose: Mucosal edema present. Right sinus exhibits no frontal sinus tenderness. Left sinus exhibits no frontal sinus tenderness.  Mouth/Throat: No oropharyngeal exudate or posterior oropharyngeal erythema.  Neck: Normal range of motion. Neck supple. No JVD present. No Brudzinski's sign noted.  Pulmonary/Chest: Effort normal. No stridor. No respiratory distress. She has wheezes (scattered).  Abdominal: She exhibits distension. There is tenderness (diffuse mid abd with palpable ventral hernia.).  Musculoskeletal: Normal range of motion.    Lymphadenopathy:       Head (right  side): No preauricular adenopathy present.       Head (left side): No preauricular adenopathy present.    She has no cervical adenopathy.       Right cervical: No superficial cervical adenopathy present.      Left cervical: No superficial cervical adenopathy present.  Neurological: She is alert.     Lab Results  Component Value Date   WBC 5.1 03/14/2015   HGB 10.7* 03/14/2015   HCT 36.3 03/14/2015   PLT 163 03/14/2015   GLUCOSE 123* 03/13/2015   CHOL 284* 07/29/2014   TRIG 159* 07/29/2014   HDL 44 07/29/2014   LDLCALC 215* 03/19/2014   ALT 15 03/04/2015   AST 15 03/04/2015   NA 139 03/13/2015   K 3.9 03/13/2015   CL 96* 03/13/2015   CREATININE 1.29* 03/13/2015   BUN 15 03/13/2015   CO2 33* 03/13/2015   TSH 2.632 03/07/2015   INR 1.09 01/21/2015   HGBA1C 5.5 10/03/2011   MICROALBUR 11.4* 09/25/2012    Dg Chest Port 1 View  03/07/2015  CLINICAL DATA:  Atrial fibrillation, shortness of breath. EXAM: PORTABLE CHEST - 1 VIEW COMPARISON:  01/21/2015 FINDINGS: There is a moderate right pleural effusion and small left pleural effusion. Heart is borderline in size. Mild vascular congestion. Bibasilar atelectasis. IMPRESSION: Bilateral pleural effusions, right greater than left. Bibasilar atelectasis. Mild vascular congestion. Electronically Signed   By: Rolm Baptise M.D.   On: 03/07/2015 13:26    Assessment & Plan:   Sharon Compton was seen today for uri.  Diagnoses and all orders for this visit:  Generalized anxiety disorder  Coronary artery disease involving native coronary artery of native heart without angina pectoris  Essential hypertension  Ventral hernia without obstruction or gangrene  Acute bronchitis, unspecified organism  Benzodiazepine dependence ([place])  Other orders -     guaiFENesin-codeine (CHERATUSSIN AC) 100-10 MG/5ML syrup; Take 5 mLs by mouth every 4 (four) hours as needed for cough. -     amoxicillin-clavulanate  (AUGMENTIN) 875-125 MG tablet; Take 1 tablet by mouth 2 (two) times daily. Take all of this medication -     ALPRAZolam (XANAX) 1 MG tablet; Take 1 tablet (1 mg total) by mouth 3 (three) times daily as needed. for anxiety    Pt. With long term dependence on benzodiazepine. Has been stable on curent level for many years. I don't think she is willing or able to wean for now. However, I will avoid complicating her situation by writing for opiates (other than the low level of codeine for her cough.)  I have changed Sharon Compton's ALPRAZolam. I am also having her start on guaiFENesin-codeine and amoxicillin-clavulanate. Additionally, I am having her maintain her iron polysaccharides, torsemide, rosuvastatin, aspirin, amiodarone, SYNTHROID, potassium chloride SA, diltiazem, and metoprolol tartrate.  Meds ordered this encounter  Medications  . guaiFENesin-codeine (CHERATUSSIN AC) 100-10 MG/5ML syrup    Sig: Take 5 mLs by mouth every 4 (four) hours as needed for cough.    Dispense:  180 mL    Refill:  0  . amoxicillin-clavulanate (AUGMENTIN) 875-125 MG tablet    Sig: Take 1 tablet by mouth 2 (two) times daily. Take all of this medication    Dispense:  20 tablet    Refill:  0  . ALPRAZolam (XANAX) 1 MG tablet    Sig: Take 1 tablet (1 mg total) by mouth 3 (three) times daily as needed. for anxiety    Dispense:  90 tablet    Refill:  5  Follow-up: Return in about 6 months (around 02/06/2016).  Claretta Fraise, M.D.

## 2015-08-10 NOTE — Addendum Note (Signed)
Addended by: Marin Olp on: 08/10/2015 03:58 PM   Modules accepted: Miquel Dunn

## 2015-08-11 ENCOUNTER — Other Ambulatory Visit: Payer: Self-pay

## 2015-08-11 MED ORDER — TORSEMIDE 20 MG PO TABS: ORAL_TABLET | ORAL | Status: DC

## 2015-09-05 ENCOUNTER — Telehealth: Payer: Self-pay | Admitting: *Deleted

## 2015-09-05 NOTE — Telephone Encounter (Signed)
OV scheduled with Dr. Harl Bowie tomorrow at 1:00 here in [place].  Patient notified & advised to go to ED if symptoms progress.

## 2015-09-05 NOTE — Telephone Encounter (Signed)
Patient calling with c/o chest pain since yesterday, off/on, comes & goes.  (3/10)  SOB - no more than normal for her.  Feet swelling, can't get shoes on.  Stated she thinks she has gained 5 pounds in the last week.  Stated that she did have bad nerves too.  Message forward to provider for further advice.

## 2015-09-05 NOTE — Telephone Encounter (Signed)
Can she get added to the NP/PA schedule in [place]? Is symptoms significantly progress would need to go to ER.   [employer]

## 2015-09-06 ENCOUNTER — Ambulatory Visit (INDEPENDENT_AMBULATORY_CARE_PROVIDER_SITE_OTHER): Payer: Medicare Other | Admitting: Cardiology

## 2015-09-06 ENCOUNTER — Encounter: Payer: Self-pay | Admitting: Cardiology

## 2015-09-06 VITALS — BP 157/80 | HR 53 | Ht 60.0 in | Wt 191.8 lb

## 2015-09-06 DIAGNOSIS — I2510 Atherosclerotic heart disease of native coronary artery without angina pectoris: Secondary | ICD-10-CM | POA: Diagnosis not present

## 2015-09-06 DIAGNOSIS — I25118 Atherosclerotic heart disease of native coronary artery with other forms of angina pectoris: Secondary | ICD-10-CM | POA: Diagnosis not present

## 2015-09-06 DIAGNOSIS — R0789 Other chest pain: Secondary | ICD-10-CM

## 2015-09-06 DIAGNOSIS — I4891 Unspecified atrial fibrillation: Secondary | ICD-10-CM | POA: Diagnosis not present

## 2015-09-06 DIAGNOSIS — I5033 Acute on chronic diastolic (congestive) heart failure: Secondary | ICD-10-CM | POA: Diagnosis not present

## 2015-09-06 MED ORDER — TORSEMIDE 20 MG PO TABS: ORAL_TABLET | ORAL | Status: DC

## 2015-09-06 NOTE — Patient Instructions (Addendum)
Your physician recommends that you schedule a follow-up appointment in: 3-4 WEEKS WITH DR. [place]  Your physician has recommended you make the following change in your medication:   START TORSEMIDE 20 MG DAILY  TAKE OVER THE COUNTER ZANTAC 150 MG TWICE DAILY.  Your physician recommends that you return for lab work in: 2 WEEKS BMP/MG  Thank you for choosing [facility]!!

## 2015-09-06 NOTE — Progress Notes (Signed)
Patient ID: Sharon Compton, female   DOB: 01/30/1941, 75 y.o.   MRN: [id number]     Clinical Summary Sharon Compton is a 75 y.o.female seen today as an add on patient for recent chest pain and leg swelling.    1. Chronic diastolic heart failure  - 02/2015 echo LVEF 55-60%, cannot eval diastolic function, mod LAE - reports recent increase in LE edema and SOB. She reports she ran out of torsemide some time ago, she also has been taking aleve fairly regularly.   2. CAD/Chest pain - BMS to RCA 02/2013  - echo 09/2012 LVEF 60-65%, no WMAs  - cath 01/2015 with moderate nonobstructive disease as reported below.  - last visit with 03/2015 Dr Bronson Ing a [facility] was recommended due to significant fatigue and SOB with f/u in 2 months Does not appear stress test was done as patient cancelled test and patient cancelled follow up.   - recentchest pain started 2 days ago at same time as increased LE edema. Sharp pain midchest 4/10, worst with activity. No other associated symptoms. Better with deep breaths. Lasts for several hours. Can come on after eating. Better with laying down. Different from prior chest pain. Takes aleve 3-4 per day.   Past Medical History  Diagnosis Date  . Hypertension   . Anxiety and depression   . Asthma   . Fibromyalgia   . History of pneumonia   . Peripheral edema   . Peptic ulcer disease   . Hiatal hernia   . GERD (gastroesophageal reflux disease)   . A-fib [facility])     Early 2013, had TEE/DCCV at [facility]; pt reports DCCV 2014 at [facility] as well.  . 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 Diverticular disease 10/01/2011  . MI, acute, non ST segment elevation ([place]) 10/02/2011  . GI bleed     Recurrent, hx cecal AVMs, ablated 07/2011; hx PUD also  . COPD (chronic obstructive pulmonary disease) ([place])   . Lymphedema   . CAD (coronary artery disease) 02/2013    2.75 x 32 mm Rebel bare metal stent in the distal RCA.      Allergies  Allergen Reactions  . Iohexol  Shortness Of Breath    PT STATES CONTRAST ALLERGY CAUSED SHORTNESS OF BREATH IN THE 70'S  . Dye Fdc Red [Red Dye] Other (See Comments)    Pt.states she passed out  . Sulfa Antibiotics Itching  . Codeine Itching and Palpitations     Current Outpatient Prescriptions  Medication Sig Dispense Refill  . ALPRAZolam (XANAX) 1 MG tablet Take 1 tablet (1 mg total) by mouth 3 (three) times daily as needed. for anxiety 90 tablet 5  . amiodarone (PACERONE) 200 MG tablet Take 1 tablet (200 mg total) by mouth daily. 90 tablet 3  . amoxicillin-clavulanate (AUGMENTIN) 875-125 MG tablet Take 1 tablet by mouth 2 (two) times daily. Take all of this medication 20 tablet 0  . aspirin 81 MG tablet Take 81 mg by mouth daily.    [name] diltiazem (CARDIZEM CD) 360 MG 24 hr capsule Take 1 capsule (360 mg total) by mouth daily. 30 capsule 2  . guaiFENesin-codeine (CHERATUSSIN AC) 100-10 MG/5ML syrup Take 5 mLs by mouth every 4 (four) hours as needed for cough. 180 mL 0  . iron polysaccharides (NIFEREX) 150 MG capsule Take 1 capsule (150 mg total) by mouth daily. 30 capsule 5  . metoprolol tartrate (LOPRESSOR) 25 MG tablet TAKE ONE-HALF TABLET BY MOUTH TWICE DAILY 60 tablet 2  .  potassium chloride SA (K-DUR,KLOR-CON) 20 MEQ tablet Take 1 tablet (20 mEq total) by mouth daily. 30 tablet 2  . rosuvastatin (CRESTOR) 40 MG tablet Take 40 mg by mouth daily.    [name] SYNTHROID 50 MCG tablet Take 1 tab daily 90 tablet 1  . torsemide (DEMADEX) 20 MG tablet daily weight monitoring and dose torsemide only when weight >180 lb 10 tablet 2   No current facility-administered medications for this visit.     Past Surgical History  Procedure Laterality Date  . Abdominal hysterectomy  age 38    nonmalignant reason  . Cholecystectomy    . Abdominal exploration surgery    . Abd tumor removed      states was 10 lbs, benign  . Knee surgery    . Bladder stent    . Esophagogastroduodenoscopy  08/24/2011    Procedure:  ESOPHAGOGASTRODUODENOSCOPY (EGD);  Surgeon: Daneil Dolin, MD;  Location: [facility];  Service: Endoscopy;  Laterality: N/A;  give phenergan 12.5mg  iv 30 mins prior to procedure  . Colonoscopy  08/25/2011    Procedure: COLONOSCOPY;  Surgeon: Daneil Dolin, MD;  Location: [facility];  Service: Endoscopy;  Laterality: N/A;  . Colonoscopy  10/03/2011    Procedure: COLONOSCOPY;  Surgeon: Daneil Dolin, MD;  Location: [facility];  Service: Endoscopy;  Laterality: N/A;  NEEDS PHENERGAN 25 MG IV ON CALL  . Colonoscopy  10/04/2011    Procedure: COLONOSCOPY;  Surgeon: Daneil Dolin, MD;  Location: [facility];  Service: Endoscopy;  Laterality: N/A;  Phenergan 12.5 mg ON CALL  . Transthoracic echocardiogram  09/2011    EF 60-65%, septal hypokinesia  . Cardiovascular stress test  09/2011    equivocal result, most likely low risk; pt refused the recommended cardiac cath to follow this up.  . Coronary angioplasty  02/2013  . Percutaneous coronary stent intervention (pci-s)  02/2013    2.75 x 32 mm Rebel bare metal stent in the distal RCA.   [name] Left and right heart catheterization with coronary angiogram N/A 03/03/2013    Procedure: LEFT AND RIGHT HEART CATHETERIZATION WITH CORONARY ANGIOGRAM;  Surgeon: Burnell Blanks, MD;  Location: [facility] CATH LAB;  Service: Cardiovascular;  Laterality: N/A;  . Cardiac catheterization N/A 01/24/2015    Procedure: Left Heart Cath and Coronary Angiography;  Surgeon: Troy Sine, MD;  Location: [facility] CV LAB;  Service: Cardiovascular;  Laterality: N/A;     Allergies  Allergen Reactions  . Iohexol Shortness Of Breath    PT STATES CONTRAST ALLERGY CAUSED SHORTNESS OF BREATH IN THE 70'S  . Dye Fdc Red [Red Dye] Other (See Comments)    Pt.states she passed out  . Sulfa Antibiotics Itching  . Codeine Itching and Palpitations      Family History  Problem Relation Age of Onset  . Diabetes Mother     Deceased  . Hypertension Mother   . Cancer Father      Deceased  . Colon cancer Neg Hx   . Coronary artery disease Mother   . Heart failure Mother      Social History Sharon Compton reports that she quit smoking about 20 years ago. Her smoking use included Cigarettes. She has a 15 pack-year smoking history. She has never used smokeless tobacco. Sharon Compton reports that she does not drink alcohol.   Review of Systems CONSTITUTIONAL: No weight loss, fever, chills, weakness or fatigue.  HEENT: Eyes: No visual loss, blurred vision, double vision or  yellow sclerae.No hearing loss, sneezing, congestion, runny nose or sore throat.  SKIN: No rash or itching.  CARDIOVASCULAR: per HPI RESPIRATORY: No shortness of breath, cough or sputum.  GASTROINTESTINAL: No anorexia, nausea, vomiting or diarrhea. No abdominal pain or blood.  GENITOURINARY: No burning on urination, no polyuria NEUROLOGICAL: No headache, dizziness, syncope, paralysis, ataxia, numbness or tingling in the extremities. No change in bowel or bladder control.  MUSCULOSKELETAL: No muscle, back pain, joint pain or stiffness.  LYMPHATICS: No enlarged nodes. No history of splenectomy.  PSYCHIATRIC: No history of depression or anxiety.  ENDOCRINOLOGIC: No reports of sweating, cold or heat intolerance. No polyuria or polydipsia.  [name]   Physical Examination Filed Vitals:   09/06/15 1258  BP: 157/80  Pulse: 53   Filed Vitals:   09/06/15 1258  Height: 5' (1.524 m)  Weight: 191 lb 12.8 oz (87 kg)    Gen: resting comfortably, no acute distress HEENT: no scleral icterus, pupils equal round and reactive, no palptable cervical adenopathy,  CV: RRR, no m/r/g, no jvd Resp: Clear to auscultation bilaterally GI: abdomen is soft, non-tender, non-distended, normal bowel sounds, no hepatosplenomegaly MSK: extremities are warm, 2+ bilateral LE edema Skin: warm, no rash Neuro:  no focal deficits Psych: appropriate affect   Diagnostic Studies 02/2015 echo Study Conclusions  - Procedure  narrative: Transthoracic echocardiography. Image  quality was poor. The study was technically difficult, as a  result of poor sound wave transmission and body habitus. - Left ventricle: The cavity size was normal. Wall thickness was  increased in a pattern of moderate LVH. Systolic function was  normal. The estimated ejection fraction was in the range of 55%  to 60%. Images were inadequate for LV wall motion assessment. The  study is not technically sufficient to allow evaluation of LV  diastolic function. - Mitral valve: Mildly thickened leaflets . There was trivial  regurgitation. - Left atrium: Mildly dilated at 38 ml/m2. - Right atrium: The atrium was mildly dilated. - Tricuspid valve: There was trivial regurgitation. - Pulmonary arteries: PA peak pressure: 19 mm Hg (S). - Inferior vena cava: The vessel was normal in size. The  respirophasic diameter changes were in the normal range (>= 50%),  consistent with normal central venous pressure. - Pericardium, extracardiac: A trivial pericardial effusion was  identified. Features were not consistent with tamponade  physiology. There was a left pleural effusion.  Impressions:  - Technically difficult study. LVEF 55-60%, moderate LVH, mild LAE,  trivial TR, normal RVSP, trivial pericardial effusion without  tamponade features, left pleural effusion.   01/2015 Cath  Mid LAD lesion, 50% stenosed.  Dist RCA lesion, 20% stenosed. The lesion was previously treated with a stent (unknown type) .  RPDA lesion, 70% stenosed.  There is hyperdynamic left ventricular systolic function.  Hyperdynamic LV function with an ejection fraction of greater than 70%.  Mild to moderate coronary obstructive disease with smooth 50% tubular stenosis in the LAD after the takeoff of the proximal second diagonal vessel, which did not significantly improve following IC nitroglycerin administration; normal left circumflex coronary artery; and  a large dominant RCA with a patent distal RCA stent in the region of the acute margin with smooth intimal hyperplasia of less than 20% and 70-80% smooth ostial narrowing of a small caliber jailed PDA vessel arising from the distal stented segment.  RECOMMENDATION:  Medical therapy with further titration of beta blocker therapy, addition of nitrates, and optimal blood pressure control.   Assessment and Plan  1.  Acute on chronic diastolic HF - increased LE edema. She ran out of torsemide, also frequent NSAID use - will restart torsemide 20mg  daily, check BMET and Mg in 2 weeks. Counseled to stop NSAIDs  2. Chest pain - atypical chest pain, long history of chest pain with negative recent workups including cath 01/2015 with nonobstructive disease - EKG in clinic without acute ischemic changes.  - symptoms suggestive of possible GI cause, she does take NSAIDs regularly. Recommended trial of OTC zantac 150mg  bid.    F/u 3-4 weeks with Dr Bronson Ing.   Arnoldo Lenis, M.D.

## 2015-09-15 ENCOUNTER — Other Ambulatory Visit: Payer: Medicare Other

## 2015-09-15 DIAGNOSIS — I4891 Unspecified atrial fibrillation: Secondary | ICD-10-CM

## 2015-09-16 ENCOUNTER — Telehealth: Payer: Self-pay | Admitting: Cardiovascular Disease

## 2015-09-16 LAB — BASIC METABOLIC PANEL
BUN/Creatinine Ratio: 14 (ref 11–26)
BUN: 16 mg/dL (ref 8–27)
CHLORIDE: 100 mmol/L (ref 96–106)
CO2: 24 mmol/L (ref 18–29)
CREATININE: 1.16 mg/dL — AB (ref 0.57–1.00)
Calcium: 8.7 mg/dL (ref 8.7–10.3)
GFR calc Af Amer: 54 mL/min/{1.73_m2} — ABNORMAL LOW (ref >59)
GFR calc non Af Amer: 46 mL/min/{1.73_m2} — ABNORMAL LOW (ref >59)
GLUCOSE: 104 mg/dL — AB (ref 65–99)
Potassium: 4 mmol/L (ref 3.5–5.2)
SODIUM: 144 mmol/L (ref 134–144)

## 2015-09-16 LAB — MAGNESIUM: MAGNESIUM: 2.1 mg/dL (ref 1.6–2.3)

## 2015-09-16 NOTE — Telephone Encounter (Signed)
Lexiscan - pre-op kidney transplant Schedule 10/20/15 arrive at 9:30

## 2015-09-19 ENCOUNTER — Telehealth: Payer: Self-pay | Admitting: *Deleted

## 2015-09-19 NOTE — Telephone Encounter (Signed)
    Sharon Compton    Phone Number: [phone]               Lexiscan - pre-op kidney transplant  Schedule 10/20/15 arrive at 9:30     [facility] [place] WILL NOT REQUIRE PRECERT FOR HER LEXISCAN HAVE A GREAT DAY

## 2015-09-19 NOTE — Telephone Encounter (Signed)
Pt aware, routed to pcp 

## 2015-09-19 NOTE — Telephone Encounter (Signed)
-----   Message from Arnoldo Lenis, MD sent at 09/19/2015  9:50 AM EDT ----- Labs look good  Zandra Abts MD

## 2015-09-27 ENCOUNTER — Ambulatory Visit: Payer: Medicare Other | Admitting: Cardiology

## 2015-09-27 NOTE — Progress Notes (Unsigned)
Patient ID: Sharon Compton, female   DOB: [birth year], 75 y.o.   MRN: [id number]     Clinical Summary Sharon Compton is a 75 y.o.female  1. Chronic diastolic heart failure  - 02/2015 echo LVEF 55-60%, cannot eval diastolic function, mod LAE - reports recent increase in LE edema and SOB. She reports she ran out of torsemide some time ago, she also has been taking aleve fairly regularly.   2. CAD/Chest pain - BMS to RCA 02/2013  - echo 09/2012 LVEF 60-65%, no WMAs  - cath 01/2015 with moderate nonobstructive disease as reported below.  - last visit with 03/2015 Dr Bronson Ing a [facility] was recommended due to significant fatigue and SOB with f/u in 2 months Does not appear stress test was done as patient cancelled test and patient cancelled follow up.   - recent chest pain started 2 days ago at same time as increased LE edema. Sharp pain midchest 4/10, worst with activity. No other associated symptoms. Better with deep breaths. Lasts for several hours. Can come on after eating. Better with laying down. Different from prior chest pain. Takes aleve 3-4 per day.  Past Medical History  Diagnosis Date  . Hypertension   . Anxiety and depression   . Asthma   . Fibromyalgia   . History of pneumonia   . Peripheral edema   . Peptic ulcer disease   . Hiatal hernia   . GERD (gastroesophageal reflux disease)   . A-fib [facility])     Early 2013, had TEE/DCCV at [facility]; pt reports DCCV 2014 at [facility] as well.  . 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 Diverticular disease 10/01/2011  . MI, acute, non ST segment elevation ([place]) 10/02/2011  . GI bleed     Recurrent, hx cecal AVMs, ablated 07/2011; hx PUD also  . COPD (chronic obstructive pulmonary disease) ([place])   . Lymphedema   . CAD (coronary artery disease) 02/2013    2.75 x 32 mm Rebel bare metal stent in the distal RCA.      Allergies  Allergen Reactions  . Iohexol Shortness Of Breath    PT STATES CONTRAST ALLERGY CAUSED SHORTNESS OF  BREATH IN THE 75'S  . Dye Fdc Red [Red Dye] Other (See Comments)    Pt.states she passed out  . Sulfa Antibiotics Itching  . Codeine Itching and Palpitations     Current Outpatient Prescriptions  Medication Sig Dispense Refill  . ALPRAZolam (XANAX) 1 MG tablet Take 1 tablet (1 mg total) by mouth 3 (three) times daily as needed. for anxiety 90 tablet 5  . amiodarone (PACERONE) 200 MG tablet Take 1 tablet (200 mg total) by mouth daily. 90 tablet 3  . aspirin 81 MG tablet Take 81 mg by mouth daily.    [name] diltiazem (CARDIZEM CD) 360 MG 24 hr capsule Take 1 capsule (360 mg total) by mouth daily. 30 capsule 2  . iron polysaccharides (NIFEREX) 150 MG capsule Take 1 capsule (150 mg total) by mouth daily. 30 capsule 5  . metoprolol tartrate (LOPRESSOR) 25 MG tablet TAKE ONE-HALF TABLET BY MOUTH TWICE DAILY 60 tablet 2  . Naproxen Sodium (ALEVE) 220 MG CAPS Take by mouth as needed. Reported on 09/06/2015    . nitroGLYCERIN (NITROSTAT) 0.4 MG SL tablet Place 0.4 mg under the tongue every 5 (five) minutes as needed for chest pain.    . potassium chloride SA (K-DUR,KLOR-CON) 20 MEQ tablet Take 1 tablet (20 mEq total) by mouth daily. 30 tablet 2  .  rosuvastatin (CRESTOR) 40 MG tablet Take 40 mg by mouth daily.    [name] SYNTHROID 50 MCG tablet Take 1 tab daily 90 tablet 1  . torsemide (DEMADEX) 20 MG tablet Take 1 tab daily 90 tablet 2   No current facility-administered medications for this visit.     Past Surgical History  Procedure Laterality Date  . Abdominal hysterectomy  age 26    nonmalignant reason  . Cholecystectomy    . Abdominal exploration surgery    . Abd tumor removed      states was 10 lbs, benign  . Knee surgery    . Bladder stent    . Esophagogastroduodenoscopy  08/24/2011    Procedure: ESOPHAGOGASTRODUODENOSCOPY (EGD);  Surgeon: Daneil Dolin, MD;  [facility];  Service: Endoscopy;  Laterality: N/A;  give phenergan 12.5mg  iv 30 mins prior to procedure  . Colonoscopy   08/25/2011    Procedure: COLONOSCOPY;  Surgeon: Daneil Dolin, MD;  [facility];  Service: Endoscopy;  Laterality: N/A;  . Colonoscopy  10/03/2011    Procedure: COLONOSCOPY;  Surgeon: Daneil Dolin, MD;  [facility];  Service: Endoscopy;  Laterality: N/A;  NEEDS PHENERGAN 25 MG IV ON CALL  . Colonoscopy  10/04/2011    Procedure: COLONOSCOPY;  Surgeon: Daneil Dolin, MD;  [facility];  Service: Endoscopy;  Laterality: N/A;  Phenergan 12.5 mg ON CALL  . Transthoracic echocardiogram  09/2011    EF 60-65%, septal hypokinesia  . Cardiovascular stress test  09/2011    equivocal result, most likely low risk; pt refused the recommended cardiac cath to follow this up.  . Coronary angioplasty  02/2013  . Percutaneous coronary stent intervention (pci-s)  02/2013    2.75 x 32 mm Rebel bare metal stent in the distal RCA.   [name] Left and right heart catheterization with coronary angiogram N/A 03/03/2013    Procedure: LEFT AND RIGHT HEART CATHETERIZATION WITH CORONARY ANGIOGRAM;  Surgeon: Burnell Blanks, MD;  Location: [facility] CATH LAB;  Service: Cardiovascular;  Laterality: N/A;  . Cardiac catheterization N/A 01/24/2015    Procedure: Left Heart Cath and Coronary Angiography;  Surgeon: Troy Sine, MD;  Location: [place] CV LAB;  Service: Cardiovascular;  Laterality: N/A;     Allergies  Allergen Reactions  . Iohexol Shortness Of Breath    PT STATES CONTRAST ALLERGY CAUSED SHORTNESS OF BREATH IN THE 70'S  . Dye Fdc Red [Red Dye] Other (See Comments)    Pt.states she passed out  . Sulfa Antibiotics Itching  . Codeine Itching and Palpitations      Family History  Problem Relation Age of Onset  . Diabetes Mother     Deceased  . Hypertension Mother   . Cancer Father     Deceased  . Colon cancer Neg Hx   . Coronary artery disease Mother   . Heart failure Mother      Social History Sharon Compton reports that she quit smoking about 20 years ago. Her smoking use  included Cigarettes. She has a 15 pack-year smoking history. She has never used smokeless tobacco. Sharon Compton reports that she does not drink alcohol.   Review of Systems CONSTITUTIONAL: No weight loss, fever, chills, weakness or fatigue.  HEENT: Eyes: No visual loss, blurred vision, double vision or yellow sclerae.No hearing loss, sneezing, congestion, runny nose or sore throat.  SKIN: No rash or itching.  CARDIOVASCULAR:  RESPIRATORY: No shortness of breath, cough or sputum.  GASTROINTESTINAL: No anorexia, nausea, vomiting or diarrhea. No abdominal pain or blood.  GENITOURINARY: No burning on urination, no polyuria NEUROLOGICAL: No headache, dizziness, syncope, paralysis, ataxia, numbness or tingling in the extremities. No change in bowel or bladder control.  MUSCULOSKELETAL: No muscle, back pain, joint pain or stiffness.  LYMPHATICS: No enlarged nodes. No history of splenectomy.  PSYCHIATRIC: No history of depression or anxiety.  ENDOCRINOLOGIC: No reports of sweating, cold or heat intolerance. No polyuria or polydipsia.  [name]   Physical Examination There were no vitals filed for this visit. There were no vitals filed for this visit.  Gen: resting comfortably, no acute distress HEENT: no scleral icterus, pupils equal round and reactive, no palptable cervical adenopathy,  CV Resp: Clear to auscultation bilaterally GI: abdomen is soft, non-tender, non-distended, normal bowel sounds, no hepatosplenomegaly MSK: extremities are warm, no edema.  Skin: warm, no rash Neuro:  no focal deficits Psych: appropriate affect   Diagnostic Studies     Assessment and Plan        Arnoldo Lenis, M.D., F.A.C.C.

## 2015-10-04 ENCOUNTER — Ambulatory Visit (INDEPENDENT_AMBULATORY_CARE_PROVIDER_SITE_OTHER): Payer: Medicare Other | Admitting: Cardiovascular Disease

## 2015-10-04 ENCOUNTER — Encounter: Payer: Self-pay | Admitting: Cardiovascular Disease

## 2015-10-04 VITALS — BP 130/70 | HR 49 | Ht 60.0 in | Wt 188.0 lb

## 2015-10-04 DIAGNOSIS — I2510 Atherosclerotic heart disease of native coronary artery without angina pectoris: Secondary | ICD-10-CM | POA: Diagnosis not present

## 2015-10-04 DIAGNOSIS — Z8719 Personal history of other diseases of the digestive system: Secondary | ICD-10-CM

## 2015-10-04 DIAGNOSIS — I25118 Atherosclerotic heart disease of native coronary artery with other forms of angina pectoris: Secondary | ICD-10-CM | POA: Diagnosis not present

## 2015-10-04 DIAGNOSIS — I5033 Acute on chronic diastolic (congestive) heart failure: Secondary | ICD-10-CM | POA: Diagnosis not present

## 2015-10-04 DIAGNOSIS — I4891 Unspecified atrial fibrillation: Secondary | ICD-10-CM

## 2015-10-04 DIAGNOSIS — I10 Essential (primary) hypertension: Secondary | ICD-10-CM

## 2015-10-04 NOTE — Patient Instructions (Signed)
Continue all current medications. Your physician wants you to follow up in: 6 months.  You will receive a reminder letter in the mail one-two months in advance.  If you don't receive a letter, please call our office to schedule the follow up appointment   
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
## 2015-11-07 ENCOUNTER — Other Ambulatory Visit: Payer: Self-pay | Admitting: Family Medicine

## 2016-01-02 ENCOUNTER — Other Ambulatory Visit: Payer: Self-pay | Admitting: Family Medicine

## 2016-01-10 ENCOUNTER — Telehealth: Payer: Self-pay | Admitting: Family Medicine

## 2016-01-10 NOTE — Telephone Encounter (Signed)
Patient states that the pharmacy called and over looked the refill and they have her medication ready.

## 2016-02-01 ENCOUNTER — Other Ambulatory Visit: Payer: Self-pay | Admitting: Family Medicine

## 2016-02-01 NOTE — Telephone Encounter (Signed)
Last filled 01/03/16, last seen 08/09/15. Has appt 02/07/16. Route to pool

## 2016-02-07 ENCOUNTER — Ambulatory Visit (INDEPENDENT_AMBULATORY_CARE_PROVIDER_SITE_OTHER): Payer: Medicare Other | Admitting: Family Medicine

## 2016-02-07 ENCOUNTER — Encounter: Payer: Self-pay | Admitting: Family Medicine

## 2016-02-07 VITALS — BP 149/67 | HR 80 | Temp 98.5°F | Ht 60.0 in | Wt 190.6 lb

## 2016-02-07 DIAGNOSIS — F418 Other specified anxiety disorders: Secondary | ICD-10-CM | POA: Diagnosis not present

## 2016-02-07 DIAGNOSIS — I482 Chronic atrial fibrillation, unspecified: Secondary | ICD-10-CM

## 2016-02-07 DIAGNOSIS — F411 Generalized anxiety disorder: Secondary | ICD-10-CM

## 2016-02-07 DIAGNOSIS — R7989 Other specified abnormal findings of blood chemistry: Secondary | ICD-10-CM

## 2016-02-07 DIAGNOSIS — E039 Hypothyroidism, unspecified: Secondary | ICD-10-CM

## 2016-02-07 DIAGNOSIS — I2510 Atherosclerotic heart disease of native coronary artery without angina pectoris: Secondary | ICD-10-CM

## 2016-02-07 DIAGNOSIS — K219 Gastro-esophageal reflux disease without esophagitis: Secondary | ICD-10-CM | POA: Diagnosis not present

## 2016-02-07 DIAGNOSIS — D509 Iron deficiency anemia, unspecified: Secondary | ICD-10-CM

## 2016-02-07 DIAGNOSIS — R945 Abnormal results of liver function studies: Secondary | ICD-10-CM

## 2016-02-07 DIAGNOSIS — I10 Essential (primary) hypertension: Secondary | ICD-10-CM

## 2016-02-07 MED ORDER — METOPROLOL TARTRATE 25 MG PO TABS: 12.5000 mg | ORAL_TABLET | Freq: Two times a day (BID) | ORAL | 1 refills | Status: DC

## 2016-02-07 MED ORDER — AMIODARONE HCL 200 MG PO TABS: 200.0000 mg | ORAL_TABLET | Freq: Every day | ORAL | 3 refills | Status: DC

## 2016-02-07 MED ORDER — ALPRAZOLAM 1 MG PO TABS: 1.0000 mg | ORAL_TABLET | Freq: Three times a day (TID) | ORAL | 5 refills | Status: DC

## 2016-02-07 MED ORDER — DILTIAZEM HCL ER COATED BEADS 360 MG PO CP24: 360.0000 mg | ORAL_CAPSULE | Freq: Every day | ORAL | 1 refills | Status: DC

## 2016-02-07 MED ORDER — MIRTAZAPINE 30 MG PO TABS: 30.0000 mg | ORAL_TABLET | Freq: Every day | ORAL | 2 refills | Status: DC

## 2016-02-07 MED ORDER — HYOSCYAMINE SULFATE 0.125 MG PO TBDP: 0.1250 mg | ORAL_TABLET | ORAL | 5 refills | Status: DC | PRN

## 2016-02-07 NOTE — Addendum Note (Signed)
Addended by: Marin Olp on: 02/07/2016 03:58 PM   Modules accepted: Orders
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
## 2016-02-08 ENCOUNTER — Other Ambulatory Visit: Payer: Self-pay | Admitting: Family Medicine

## 2016-02-20 ENCOUNTER — Other Ambulatory Visit: Payer: Self-pay | Admitting: Family Medicine

## 2016-02-20 ENCOUNTER — Telehealth: Payer: Self-pay

## 2016-02-20 MED ORDER — DICYCLOMINE HCL 10 MG PO CAPS: 10.0000 mg | ORAL_CAPSULE | Freq: Three times a day (TID) | ORAL | 2 refills | Status: DC

## 2016-02-20 NOTE — Telephone Encounter (Signed)
Patient aware of medication change. 

## 2016-02-20 NOTE — Telephone Encounter (Signed)
I sent in dicyclomine as a substitute WS

## 2016-04-04 ENCOUNTER — Ambulatory Visit (INDEPENDENT_AMBULATORY_CARE_PROVIDER_SITE_OTHER): Payer: Medicare Other | Admitting: Family Medicine

## 2016-04-04 ENCOUNTER — Encounter: Payer: Self-pay | Admitting: Family Medicine

## 2016-04-04 VITALS — BP 126/68 | HR 57 | Temp 98.4°F | Ht 60.0 in | Wt 192.4 lb

## 2016-04-04 DIAGNOSIS — I25118 Atherosclerotic heart disease of native coronary artery with other forms of angina pectoris: Secondary | ICD-10-CM | POA: Diagnosis not present

## 2016-04-04 DIAGNOSIS — G20 Parkinson's disease: Secondary | ICD-10-CM | POA: Diagnosis not present

## 2016-04-04 DIAGNOSIS — Z23 Encounter for immunization: Secondary | ICD-10-CM | POA: Diagnosis not present

## 2016-04-04 DIAGNOSIS — I10 Essential (primary) hypertension: Secondary | ICD-10-CM | POA: Diagnosis not present

## 2016-04-04 DIAGNOSIS — F411 Generalized anxiety disorder: Secondary | ICD-10-CM | POA: Diagnosis not present

## 2016-04-04 MED ORDER — CARBIDOPA-LEVODOPA 10-100 MG PO TABS: 1.0000 | ORAL_TABLET | Freq: Three times a day (TID) | ORAL | 5 refills | Status: DC

## 2016-04-04 MED ORDER — SYNTHROID 50 MCG PO TABS: ORAL_TABLET | ORAL | 1 refills | Status: DC

## 2016-04-04 NOTE — Progress Notes (Signed)
Subjective:  Patient ID: Sharon Compton, female    DOB: 02/13/1941  Age: 75 y.o. MRN: [id number]  CC: Anxiety (pt here today c/o shaking so bad she spills things on her before she can get it to her mouth and she is having trouble keeping her food down)   HPI AISHIA DEVICO presents for Patient says that she's noticed a tremor. Her head shakes sometimes. Her husband says he's noticed it and asks her if she does. Additionally her hand shake it times. She doesn't feel particularly nervous more than usual and this occurs. It is intermittent occurring for a few minutes at a time getting going way then may be coming back later in the same day or the next day. However it does seem to be getting more frequent. It does seem to be making it more difficult to do fine movements and this is why she has had some spells from shaking or dropping things. There is no pain associated. She does not remember exactly when it started it just seems to have been there for several weeks perhaps months, however, it does seem to be progressing. It involves primarily the head and the hands. She denies any recent gait changes.     History Azelea has a past medical history of A-fib ([place]); Anxiety and depression; Asthma; AVM (arteriovenous malformation) of colon (10/01/2011); CAD (coronary artery disease) (02/2013); COPD (chronic obstructive pulmonary disease) ([place]); Diverticular disease (10/01/2011); Fibromyalgia; GERD (gastroesophageal reflux disease); GI bleed; Hiatal hernia; History of pneumonia; Hypertension; Lymphedema; MI, acute, non ST segment elevation ([place]) (10/02/2011); Peptic ulcer disease; and Peripheral edema.   She has a past surgical history that includes Abdominal hysterectomy (age 86); Cholecystectomy; Abdominal exploration surgery; abd tumor removed; Knee surgery; bladder stent; Esophagogastroduodenoscopy (08/24/2011); Colonoscopy (08/25/2011); Colonoscopy (10/03/2011); Colonoscopy (10/04/2011); transthoracic echocardiogram  (09/2011); Cardiovascular stress test (09/2011); Coronary angioplasty (02/2013); Percutaneous coronary stent intervention (pci-s) (02/2013); left and right heart catheterization with coronary angiogram (N/A, 03/03/2013); and Cardiac catheterization (N/A, 01/24/2015).   Her family history includes Cancer in her father; Coronary artery disease in her mother; Diabetes in her mother; Heart failure in her mother; Hypertension in her mother.She reports that she quit smoking about 20 years ago. Her smoking use included Cigarettes. She has a 15.00 pack-year smoking history. She has never used smokeless tobacco. She reports that she does not drink alcohol or use drugs.    ROS Review of Systems  Constitutional: Negative for activity change, appetite change and fever.  HENT: Negative for congestion, rhinorrhea and sore throat.   Eyes: Negative for visual disturbance.  Respiratory: Negative for cough and shortness of breath.   Cardiovascular: Negative for chest pain and palpitations.  Gastrointestinal: Negative for abdominal pain, diarrhea and nausea.  Genitourinary: Negative for dysuria.  Musculoskeletal: Negative for arthralgias and myalgias.    Objective:  BP 126/68   Pulse (!) 57   Temp 98.4 F (36.9 C) (Oral)   Ht 5' (1.524 m)   Wt 192 lb 6 oz (87.3 kg)   BMI 37.57 kg/m   BP Readings from Last 3 Encounters:  04/04/16 126/68  02/07/16 (!) 149/67  10/04/15 130/70    Wt Readings from Last 3 Encounters:  04/04/16 192 lb 6 oz (87.3 kg)  02/07/16 190 lb 9.6 oz (86.5 kg)  10/04/15 188 lb (85.3 kg)     Physical Exam  Constitutional: She is oriented to person, place, and time. She appears well-developed and well-nourished. No distress.  HENT:  Head: Normocephalic and atraumatic.  Eyes: Conjunctivae are normal. Pupils are equal, round, and reactive to light.  Neck: Normal range of motion. Neck supple. No thyromegaly present.  Cardiovascular: Normal rate, regular rhythm and normal heart sounds.    No murmur heard. Pulmonary/Chest: Effort normal and breath sounds normal. No respiratory distress. She has no wheezes. She has no rales.  Abdominal: Soft. Bowel sounds are normal. She exhibits no distension. There is no tenderness.  Musculoskeletal: Normal range of motion.  Lymphadenopathy:    She has no cervical adenopathy.  Neurological: She is alert and oriented to person, place, and time. Abnormal coordination: there is a quivering rest tremor noted in theChin. There is a slight palsy of the head. There is early cogwheeling at the wrists.   Skin: Skin is warm and dry.  Psychiatric: She has a normal mood and affect. Her behavior is normal. Judgment and thought content normal.     Lab Results  Component Value Date   WBC 5.1 03/14/2015   HGB 10.7 (L) 03/14/2015   HCT 36.3 03/14/2015   PLT 163 03/14/2015   GLUCOSE 104 (H) 09/15/2015   CHOL 284 (H) 07/29/2014   TRIG 159 (H) 07/29/2014   HDL 44 07/29/2014   LDLCALC 215 (H) 03/19/2014   ALT 15 03/04/2015   AST 15 03/04/2015   NA 144 09/15/2015   K 4.0 09/15/2015   CL 100 09/15/2015   CREATININE 1.16 (H) 09/15/2015   BUN 16 09/15/2015   CO2 24 09/15/2015   TSH 2.632 03/07/2015   INR 1.09 01/21/2015   HGBA1C 5.5 10/03/2011   MICROALBUR 11.4 (H) 09/25/2012    Dg Chest Port 1 View  Result Date: 03/07/2015 CLINICAL DATA:  Atrial fibrillation, shortness of breath. EXAM: PORTABLE CHEST - 1 VIEW COMPARISON:  01/21/2015 FINDINGS: There is a moderate right pleural effusion and small left pleural effusion. Heart is borderline in size. Mild vascular congestion. Bibasilar atelectasis. IMPRESSION: Bilateral pleural effusions, right greater than left. Bibasilar atelectasis. Mild vascular congestion. Electronically Signed   By: Rolm Baptise M.D.   On: 03/07/2015 13:26    Assessment & Plan:   Melvine was seen today for anxiety.  Diagnoses and all orders for this visit:  Primary Parkinsonism ([place])  Generalized anxiety  disorder  Essential hypertension  Other orders -     carbidopa-levodopa (SINEMET) 10-100 MG tablet; Take 1 tablet by mouth 3 (three) times daily. For parkinsonism -     SYNTHROID 50 MCG tablet; Take 1 tab daily on an empty stomach for thyroid      I have changed Ms. Martorelli's SYNTHROID. I am also having her start on carbidopa-levodopa. Additionally, I am having her maintain her iron polysaccharides, rosuvastatin, aspirin, potassium chloride SA, nitroGLYCERIN, Naproxen Sodium, torsemide, ALPRAZolam, mirtazapine, amiodarone, diltiazem, metoprolol tartrate, dicyclomine, and hyoscyamine.  Meds ordered this encounter  Medications  . hyoscyamine (LEVSIN SL) 0.125 MG SL tablet  . carbidopa-levodopa (SINEMET) 10-100 MG tablet    Sig: Take 1 tablet by mouth 3 (three) times daily. For parkinsonism    Dispense:  90 tablet    Refill:  5  . SYNTHROID 50 MCG tablet    Sig: Take 1 tab daily on an empty stomach for thyroid    Dispense:  90 tablet    Refill:  1     Follow-up: Return in about 1 month (around 05/05/2016) for Parkinsonism, Hypothyroidism.  Claretta Fraise, M.D.

## 2016-04-05 DIAGNOSIS — Z23 Encounter for immunization: Secondary | ICD-10-CM | POA: Diagnosis not present

## 2016-04-05 DIAGNOSIS — F411 Generalized anxiety disorder: Secondary | ICD-10-CM | POA: Diagnosis not present

## 2016-04-05 DIAGNOSIS — I10 Essential (primary) hypertension: Secondary | ICD-10-CM | POA: Diagnosis not present

## 2016-04-05 DIAGNOSIS — G20 Parkinson's disease: Secondary | ICD-10-CM | POA: Diagnosis not present

## 2016-04-05 DIAGNOSIS — I25118 Atherosclerotic heart disease of native coronary artery with other forms of angina pectoris: Secondary | ICD-10-CM | POA: Diagnosis not present

## 2016-04-09 ENCOUNTER — Telehealth: Payer: Self-pay | Admitting: Family Medicine

## 2016-04-09 NOTE — Telephone Encounter (Signed)
Pt had been taking medication on an empty stomach Pt instructed to try taking medication with food Pt will call back if symptoms persist

## 2016-04-11 ENCOUNTER — Encounter: Payer: Self-pay | Admitting: Cardiovascular Disease

## 2016-04-11 ENCOUNTER — Ambulatory Visit: Payer: Medicare Other | Admitting: Cardiovascular Disease

## 2016-04-11 ENCOUNTER — Ambulatory Visit (INDEPENDENT_AMBULATORY_CARE_PROVIDER_SITE_OTHER): Payer: Medicare Other | Admitting: Cardiovascular Disease

## 2016-04-11 VITALS — BP 160/80 | HR 69 | Ht 60.0 in | Wt 193.0 lb

## 2016-04-11 DIAGNOSIS — R001 Bradycardia, unspecified: Secondary | ICD-10-CM | POA: Diagnosis not present

## 2016-04-11 DIAGNOSIS — R079 Chest pain, unspecified: Secondary | ICD-10-CM | POA: Diagnosis not present

## 2016-04-11 DIAGNOSIS — Z8719 Personal history of other diseases of the digestive system: Secondary | ICD-10-CM

## 2016-04-11 DIAGNOSIS — F439 Reaction to severe stress, unspecified: Secondary | ICD-10-CM

## 2016-04-11 DIAGNOSIS — I25118 Atherosclerotic heart disease of native coronary artery with other forms of angina pectoris: Secondary | ICD-10-CM | POA: Diagnosis not present

## 2016-04-11 DIAGNOSIS — I5032 Chronic diastolic (congestive) heart failure: Secondary | ICD-10-CM

## 2016-04-11 DIAGNOSIS — I10 Essential (primary) hypertension: Secondary | ICD-10-CM | POA: Diagnosis not present

## 2016-04-11 DIAGNOSIS — R0602 Shortness of breath: Secondary | ICD-10-CM

## 2016-04-11 MED ORDER — DILTIAZEM HCL ER COATED BEADS 240 MG PO CP24: 240.0000 mg | ORAL_CAPSULE | Freq: Every day | ORAL | 3 refills | Status: DC

## 2016-04-11 NOTE — Addendum Note (Signed)
Addended by: Levonne Hubert on: 04/11/2016 02:24 PM   Modules accepted: Orders

## 2016-04-11 NOTE — Patient Instructions (Signed)
Your physician wants you to follow-up in: 6 Months with Jory Sims, NP.  You will receive a reminder letter in the mail two months in advance. If you don't receive a letter, please call our office to schedule the follow-up appointment.  Your physician has recommended you make the following change in your medication:  Decrease Diltiazem to 240 mg Daily   If you need a refill on your cardiac medications before your next appointment, please call your pharmacy.  Thank you for choosing [facility]!
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
## 2016-04-16 ENCOUNTER — Telehealth: Payer: Self-pay | Admitting: Cardiovascular Disease

## 2016-04-16 NOTE — Telephone Encounter (Signed)
Patient can hardly breath and feels like she will pass out when she walks.  Wants to know what all she is taking and for what.  Was told by [facility] told her they were all cardiac and she is wondering if that is causing her SOB

## 2016-04-16 NOTE — Telephone Encounter (Signed)
Pt c/o SOB when walking worsening since LOV on 10/18 - thinks medication is causing her SOB says she can catch her breath after sitting a few minutes- denies chest pain - dizziness - has swelling but says this is unchanged from [place] - says Dr. Livia Snellen gave her new medication for Parkinson's on 10/11 (Sinemet) - Pt will call Dr Livia Snellen an update him on symptoms since starting medication - also will report to urgent care if SOB worsens - will forward to covering provider with Dr. Bronson Ing out of office.

## 2016-04-17 ENCOUNTER — Ambulatory Visit: Payer: Medicare Other | Admitting: Family Medicine

## 2016-04-17 NOTE — Telephone Encounter (Signed)
Agree with initial evaluation by pcp, if after evaluation there is evidence her symptoms are heart related we can work to have her seen here.    Zandra Abts MD

## 2016-04-17 NOTE — Telephone Encounter (Signed)
Pt will call office if Dr Livia Snellen suggest she needs appt with cardiologist

## 2016-04-23 ENCOUNTER — Telehealth: Payer: Self-pay | Admitting: Family Medicine

## 2016-05-07 ENCOUNTER — Ambulatory Visit: Payer: Medicare Other | Admitting: Family Medicine

## 2016-05-10 ENCOUNTER — Telehealth: Payer: Self-pay | Admitting: Family Medicine

## 2016-05-10 NOTE — Telephone Encounter (Signed)
ntbs

## 2016-05-14 ENCOUNTER — Encounter: Payer: Medicare Other | Admitting: *Deleted

## 2016-05-28 ENCOUNTER — Telehealth: Payer: Self-pay | Admitting: *Deleted

## 2016-05-28 NOTE — Telephone Encounter (Signed)
Patient c/o of BLE that started one week after dose decrease of diltiazem. Patient said that both legs are red but no c/o pain or weeping. Patient said she has not weighed and is unable to confirm if she has gained weight. Patient said that she has a history of swelling in the past as well as sob. No c/o increased dizziness or chest pain. Patient confirmed that she is taking torsemide 20 mg daily but said she quit taking K+ 20 meq about a year ago because she didn't feel like it was doing her any good. Patient said she is able to lie back while sleeping. Patient said her feet has never been this swollen. Please advise.

## 2016-05-28 NOTE — Telephone Encounter (Signed)
Patient informed and said that she does not want to change her medications at this time and is going to see her PCP about the swelling since he recently gave her a steroid pill and a medication for Parkinson's. Patient understands that dose changes will not be sent to the pharmacy.

## 2016-05-28 NOTE — Telephone Encounter (Signed)
Odd that legs would swell after a dose decrease. Can reduce further to 120 mg daily and increase metoprolol to 25 mg bid.

## 2016-07-07 ENCOUNTER — Ambulatory Visit: Payer: Medicare Other | Admitting: Nurse Practitioner

## 2016-07-09 ENCOUNTER — Ambulatory Visit (INDEPENDENT_AMBULATORY_CARE_PROVIDER_SITE_OTHER): Payer: Medicare Other | Admitting: Family Medicine

## 2016-07-09 ENCOUNTER — Encounter (INDEPENDENT_AMBULATORY_CARE_PROVIDER_SITE_OTHER): Payer: Self-pay

## 2016-07-09 ENCOUNTER — Encounter: Payer: Self-pay | Admitting: Family Medicine

## 2016-07-09 VITALS — BP 121/54 | HR 55 | Temp 97.0°F | Ht 60.0 in | Wt 197.0 lb

## 2016-07-09 DIAGNOSIS — J111 Influenza due to unidentified influenza virus with other respiratory manifestations: Secondary | ICD-10-CM

## 2016-07-09 DIAGNOSIS — R52 Pain, unspecified: Secondary | ICD-10-CM

## 2016-07-09 LAB — VERITOR FLU A/B WAIVED
INFLUENZA A: NEGATIVE
INFLUENZA B: NEGATIVE

## 2016-07-09 MED ORDER — ROPINIROLE HCL 0.25 MG PO TABS: 0.2500 mg | ORAL_TABLET | Freq: Every day | ORAL | 1 refills | Status: DC

## 2016-07-09 MED ORDER — ALBUTEROL SULFATE HFA 108 (90 BASE) MCG/ACT IN AERS: 2.0000 | INHALATION_SPRAY | Freq: Four times a day (QID) | RESPIRATORY_TRACT | 11 refills | Status: DC | PRN

## 2016-07-09 MED ORDER — OSELTAMIVIR PHOSPHATE 75 MG PO CAPS: 75.0000 mg | ORAL_CAPSULE | Freq: Two times a day (BID) | ORAL | 0 refills | Status: DC

## 2016-07-09 NOTE — Progress Notes (Signed)
Subjective:  Patient ID: Sharon Compton, female    DOB: 1941/02/27  Age: 76 y.o. MRN: [id number]  CC: Generalized Body Aches (pt here today for general body aches and feeling weak)   HPI Sharon Compton presents for  Patient presents with dry cough runny stuffy nose. Diffuse headache of moderate intensity. Patient also has chills and subjective fever. Body aches worst in the back but present in the legs, shoulders, and torso as well. Has sapped the energy to the point that of being unable to perform usual activities other than ADLs. Onset 2 days ago.   History Sharon Compton has a past medical history of A-fib ([place]); Anxiety and depression; Asthma; AVM (arteriovenous malformation) of colon (10/01/2011); CAD (coronary artery disease) (02/2013); COPD (chronic obstructive pulmonary disease) ([place]); Diverticular disease (10/01/2011); Fibromyalgia; GERD (gastroesophageal reflux disease); GI bleed; Hiatal hernia; History of pneumonia; Hypertension; Lymphedema; MI, acute, non ST segment elevation ([place]) (10/02/2011); Peptic ulcer disease; and Peripheral edema.   She has a past surgical history that includes Abdominal hysterectomy (age 7); Cholecystectomy; Abdominal exploration surgery; abd tumor removed; Knee surgery; bladder stent; Esophagogastroduodenoscopy (08/24/2011); Colonoscopy (08/25/2011); Colonoscopy (10/03/2011); Colonoscopy (10/04/2011); transthoracic echocardiogram (09/2011); Cardiovascular stress test (09/2011); Coronary angioplasty (02/2013); Percutaneous coronary stent intervention (pci-s) (02/2013); left and right heart catheterization with coronary angiogram (N/A, 03/03/2013); and Cardiac catheterization (N/A, 01/24/2015).   Her family history includes Cancer in her father; Coronary artery disease in her mother; Diabetes in her mother; Heart failure in her mother; Hypertension in her mother.She reports that she quit smoking about 21 years ago. Her smoking use included Cigarettes. She has a 15.00 pack-year smoking  history. She has never used smokeless tobacco. She reports that she does not drink alcohol or use drugs.  Current Outpatient Prescriptions on File Prior to Visit  Medication Sig Dispense Refill  . ALPRAZolam (XANAX) 1 MG tablet Take 1 tablet (1 mg total) by mouth 3 (three) times daily. 90 tablet 5  . amiodarone (PACERONE) 200 MG tablet Take 1 tablet (200 mg total) by mouth daily. 90 tablet 3  . aspirin 81 MG tablet Take 81 mg by mouth daily.    . carbidopa-levodopa (SINEMET) 10-100 MG tablet Take 1 tablet by mouth 3 (three) times daily. For parkinsonism 90 tablet 5  . diltiazem (CARDIZEM CD) 240 MG 24 hr capsule Take 1 capsule (240 mg total) by mouth daily. 90 capsule 3  . hyoscyamine (LEVSIN SL) 0.125 MG SL tablet     . metoprolol tartrate (LOPRESSOR) 25 MG tablet Take 0.5 tablets (12.5 mg total) by mouth 2 (two) times daily. 90 tablet 1  . nitroGLYCERIN (NITROSTAT) 0.4 MG SL tablet Place 0.4 mg under the tongue every 5 (five) minutes as needed for chest pain.    . rosuvastatin (CRESTOR) 40 MG tablet Take 20 mg by mouth daily.     [name] torsemide (DEMADEX) 20 MG tablet Take 1 tab daily 90 tablet 2  . dicyclomine (BENTYL) 10 MG capsule Take 1 capsule (10 mg total) by mouth 4 (four) times daily -  before meals and at bedtime. (Patient not taking: Reported on 07/09/2016) 120 capsule 2  . iron polysaccharides (NIFEREX) 150 MG capsule Take 1 capsule (150 mg total) by mouth daily. (Patient not taking: Reported on 07/09/2016) 30 capsule 5  . mirtazapine (REMERON) 30 MG tablet Take 1 tablet (30 mg total) by mouth at bedtime. For mood, sleep (Patient not taking: Reported on 07/09/2016) 30 tablet 2  . Naproxen Sodium (ALEVE) 220 MG CAPS Take by  mouth as needed. Reported on 09/06/2015    . potassium chloride SA (K-DUR,KLOR-CON) 20 MEQ tablet Take 1 tablet (20 mEq total) by mouth daily. (Patient not taking: Reported on 07/09/2016) 30 tablet 2  . SYNTHROID 50 MCG tablet Take 1 tab daily on an empty stomach for thyroid  (Patient not taking: Reported on 07/09/2016) 90 tablet 1   No current facility-administered medications on file prior to visit.     ROS Review of Systems  Constitutional: Positive for appetite change, chills and fever.  HENT: Positive for congestion and rhinorrhea. Negative for ear pain, nosebleeds, postnasal drip, sinus pressure and sore throat.   Respiratory: Negative for chest tightness and shortness of breath.   Cardiovascular: Negative for chest pain.  Musculoskeletal: Positive for myalgias.  Skin: Negative for rash.  Neurological: Positive for headaches.    Objective:  BP (!) 121/54   Pulse (!) 55   Temp 97 F (36.1 C) (Oral)   Ht 5' (1.524 m)   Wt 197 lb (89.4 kg)   BMI 38.47 kg/m   Physical Exam  Constitutional: She is oriented to person, place, and time. She appears well-developed and well-nourished. No distress.  Cardiovascular: Normal rate and regular rhythm.   Pulmonary/Chest: Breath sounds normal.  Neurological: She is alert and oriented to person, place, and time.  Skin: Skin is warm and dry.  Psychiatric: She has a normal mood and affect.    Assessment & Plan:   Sharon Compton was seen today for generalized body aches.  Diagnoses and all orders for this visit:  Body aches -     Veritor Flu A/B Waived  Influenza with respiratory manifestation  Other orders -     oseltamivir (TAMIFLU) 75 MG capsule; Take 1 capsule (75 mg total) by mouth 2 (two) times daily. -     rOPINIRole (REQUIP) 0.25 MG tablet; Take 1 tablet (0.25 mg total) by mouth at bedtime. -     albuterol (PROVENTIL HFA;VENTOLIN HFA) 108 (90 Base) MCG/ACT inhaler; Inhale 2 puffs into the lungs every 6 (six) hours as needed for wheezing or shortness of breath.   I am having Sharon Compton start on oseltamivir, rOPINIRole, and albuterol. I am also having her maintain her iron polysaccharides, rosuvastatin, aspirin, potassium chloride SA, nitroGLYCERIN, Naproxen Sodium, torsemide, ALPRAZolam, mirtazapine,  amiodarone, metoprolol tartrate, dicyclomine, hyoscyamine, carbidopa-levodopa, SYNTHROID, and diltiazem.  Meds ordered this encounter  Medications  . oseltamivir (TAMIFLU) 75 MG capsule    Sig: Take 1 capsule (75 mg total) by mouth 2 (two) times daily.    Dispense:  10 capsule    Refill:  0  . rOPINIRole (REQUIP) 0.25 MG tablet    Sig: Take 1 tablet (0.25 mg total) by mouth at bedtime.    Dispense:  30 tablet    Refill:  1  . albuterol (PROVENTIL HFA;VENTOLIN HFA) 108 (90 Base) MCG/ACT inhaler    Sig: Inhale 2 puffs into the lungs every 6 (six) hours as needed for wheezing or shortness of breath.    Dispense:  1 Inhaler    Refill:  11     Follow-up: Return in about 1 month (around 08/09/2016).  Claretta Fraise, M.D.

## 2016-08-04 ENCOUNTER — Other Ambulatory Visit: Payer: Self-pay | Admitting: Family Medicine

## 2016-08-06 ENCOUNTER — Other Ambulatory Visit: Payer: Self-pay | Admitting: Family Medicine

## 2016-08-06 NOTE — Telephone Encounter (Signed)
Last filled 07/06/16, last seen 04/04/16 for GAD. Call in

## 2016-08-08 ENCOUNTER — Encounter: Payer: Self-pay | Admitting: Family Medicine

## 2016-08-08 ENCOUNTER — Ambulatory Visit (INDEPENDENT_AMBULATORY_CARE_PROVIDER_SITE_OTHER): Payer: Medicare Other

## 2016-08-08 ENCOUNTER — Ambulatory Visit (INDEPENDENT_AMBULATORY_CARE_PROVIDER_SITE_OTHER): Payer: Medicare Other | Admitting: Family Medicine

## 2016-08-08 VITALS — BP 139/60 | HR 54 | Temp 98.1°F | Ht 60.0 in | Wt 189.0 lb

## 2016-08-08 DIAGNOSIS — R109 Unspecified abdominal pain: Secondary | ICD-10-CM | POA: Diagnosis not present

## 2016-08-08 MED ORDER — TORSEMIDE 20 MG PO TABS: 40.0000 mg | ORAL_TABLET | Freq: Every day | ORAL | 1 refills | Status: DC

## 2016-08-08 MED ORDER — ALPRAZOLAM 1 MG PO TABS: 1.0000 mg | ORAL_TABLET | Freq: Three times a day (TID) | ORAL | 5 refills | Status: DC

## 2016-08-08 NOTE — Progress Notes (Signed)
Subjective:  Patient ID: Sharon Compton, female    DOB: [birth year]  Age: 76 y.o. MRN: [id number]  CC: Fall (pt here today c/o pain in left wrist/hand and left side after falling 2 nights ago)   HPI MARISELA SHARBAUGH presents for Recheck of her anxiety and depression. Sx well controlled with current dose of xanax. Gets nervous, anxious & takes the med twice a day with good relief.  Patient was reviewed through [place] and this shows her only controlled substance prescription is the alprazolam prescribed here. Depression screen [facility] 2/9 08/08/2016 07/09/2016 04/04/2016 02/07/2016 08/09/2015  Decreased Interest 0 0 0 0 0  Down, Depressed, Hopeless 0 0 1 1 0  PHQ - 2 Score 0 0 1 1 0  Altered sleeping - - - - -  Tired, decreased energy - - - - -  Change in appetite - - - - -  Feeling bad or failure about yourself  - - - - -  Trouble concentrating - - - - -  Moving slowly or fidgety/restless - - - - -  Suicidal thoughts - - - - -  PHQ-9 Score - - - - -     Torsemide not working for swelling. Having 2+ edema at ankles.No dyspnea.  Left wrist pain is mild. No edema. Worst pain is at the left lateral ribs at approx. Rib 9-10 and anterior axillary line    History Avaleigh has a past medical history of A-fib ([place]); Anxiety and depression; Asthma; AVM (arteriovenous malformation) of colon (10/01/2011); CAD (coronary artery disease) (02/2013); COPD (chronic obstructive pulmonary disease) ([place]); Diverticular disease (10/01/2011); Fibromyalgia; GERD (gastroesophageal reflux disease); GI bleed; Hiatal hernia; History of pneumonia; Hypertension; Lymphedema; MI, acute, non ST segment elevation ([place]) (10/02/2011); Peptic ulcer disease; and Peripheral edema.   She has a past surgical history that includes Abdominal hysterectomy (age 59); Cholecystectomy; Abdominal exploration surgery; abd tumor removed; Knee surgery; bladder stent; Esophagogastroduodenoscopy (08/24/2011); Colonoscopy (08/25/2011); Colonoscopy (10/03/2011);  Colonoscopy (10/04/2011); transthoracic echocardiogram (09/2011); Cardiovascular stress test (09/2011); Coronary angioplasty (02/2013); Percutaneous coronary stent intervention (pci-s) (02/2013); left and right heart catheterization with coronary angiogram (N/A, 03/03/2013); and Cardiac catheterization (N/A, 01/24/2015).   Her family history includes Cancer in her father; Coronary artery disease in her mother; Diabetes in her mother; Heart failure in her mother; Hypertension in her mother.She reports that she quit smoking about 21 years ago. Her smoking use included Cigarettes. She has a 15.00 pack-year smoking history. She has never used smokeless tobacco. She reports that she does not drink alcohol or use drugs.    ROS Review of Systems  Constitutional: Negative for activity change, appetite change and fever.  HENT: Negative for congestion, rhinorrhea and sore throat.   Eyes: Negative for visual disturbance.  Respiratory: Negative for cough and shortness of breath.   Cardiovascular: Negative for chest pain and palpitations.  Gastrointestinal: Negative for abdominal pain, diarrhea and nausea.  Genitourinary: Negative for dysuria.  Musculoskeletal: Negative for arthralgias and myalgias.    Objective:  BP 139/60   Pulse (!) 54   Temp 98.1 F (36.7 C) (Oral)   Ht 5' (1.524 m)   Wt 189 lb (85.7 kg)   BMI 36.91 kg/m   BP Readings from Last 3 Encounters:  08/08/16 139/60  07/09/16 (!) 121/54  04/11/16 (!) 160/80    Wt Readings from Last 3 Encounters:  08/08/16 189 lb (85.7 kg)  07/09/16 197 lb (89.4 kg)  04/11/16 193 lb (87.5 kg)  Physical Exam  Constitutional: She is oriented to person, place, and time. She appears well-developed and well-nourished. No distress.  HENT:  Head: Normocephalic and atraumatic.  Right Ear: External ear normal.  Left Ear: External ear normal.  Nose: Nose normal.  Mouth/Throat: Oropharynx is clear and moist.  Eyes: Conjunctivae and EOM are normal.  Pupils are equal, round, and reactive to light.  Neck: Normal range of motion. Neck supple. No thyromegaly present.  Cardiovascular: Normal rate, regular rhythm and normal heart sounds.   No murmur heard. Pulmonary/Chest: Effort normal and breath sounds normal. No respiratory distress. She has no wheezes. She has no rales.  Abdominal: Soft. Bowel sounds are normal. She exhibits no distension. There is no tenderness.  Musculoskeletal: She exhibits tenderness (lateral chest wall inferolaterally. ).  Lymphadenopathy:    She has no cervical adenopathy.  Neurological: She is alert and oriented to person, place, and time. She has normal reflexes.  Skin: Skin is warm and dry.  Psychiatric: She has a normal mood and affect. Her behavior is normal. Judgment and thought content normal.    Dg Chest Port 1 View  Result Date: 03/07/2015 CLINICAL DATA:  Atrial fibrillation, shortness of breath. EXAM: PORTABLE CHEST - 1 VIEW COMPARISON:  01/21/2015 FINDINGS: There is a moderate right pleural effusion and small left pleural effusion. Heart is borderline in size. Mild vascular congestion. Bibasilar atelectasis. IMPRESSION: Bilateral pleural effusions, right greater than left. Bibasilar atelectasis. Mild vascular congestion. Electronically Signed   By: Rolm Baptise M.D.   On: 03/07/2015 13:26    Assessment & Plan:   Annalisia was seen today for fall.  Diagnoses and all orders for this visit:  Left flank pain -     DG Ribs Unilateral W/Chest Left; Future -     Cancel: Urinalysis  Other orders -     ALPRAZolam (XANAX) 1 MG tablet; Take 1 tablet (1 mg total) by mouth 3 (three) times daily. -     torsemide (DEMADEX) 20 MG tablet; Take 2 tablets (40 mg total) by mouth daily.     I have discontinued Ms. Kirk's iron polysaccharides, potassium chloride SA, mirtazapine, dicyclomine, hyoscyamine, oseltamivir, and rOPINIRole. I have also changed her torsemide. Additionally, I am having her maintain her aspirin,  nitroGLYCERIN, Naproxen Sodium, amiodarone, carbidopa-levodopa, SYNTHROID, diltiazem, albuterol, metoprolol tartrate, diltiazem, and ALPRAZolam.  Allergies as of 08/08/2016      Reactions   Iohexol Shortness Of Breath   PT STATES CONTRAST ALLERGY CAUSED SHORTNESS OF BREATH IN THE 70'S   Dye Fdc Red [red Dye] Other (See Comments)   Pt.states she passed out   Sulfa Antibiotics Itching   Codeine Itching, Palpitations      Medication List       Accurate as of 08/08/16 11:59 PM. Always use your most recent med list.          albuterol 108 (90 Base) MCG/ACT inhaler Commonly known as:  PROVENTIL HFA;VENTOLIN HFA Inhale 2 puffs into the lungs every 6 (six) hours as needed for wheezing or shortness of breath.   ALEVE 220 MG Caps Generic drug:  Naproxen Sodium Take by mouth as needed. Reported on 09/06/2015   ALPRAZolam 1 MG tablet Commonly known as:  XANAX Take 1 tablet (1 mg total) by mouth 3 (three) times daily.   amiodarone 200 MG tablet Commonly known as:  PACERONE Take 1 tablet (200 mg total) by mouth daily.   aspirin 81 MG tablet Take 81 mg by mouth daily.   carbidopa-levodopa 10-100  MG tablet Commonly known as:  SINEMET Take 1 tablet by mouth 3 (three) times daily. For parkinsonism   diltiazem 240 MG 24 hr capsule Commonly known as:  CARDIZEM CD Take 1 capsule (240 mg total) by mouth daily.   diltiazem 360 MG 24 hr capsule Commonly known as:  CARDIZEM CD Take 360 mg by mouth daily.   metoprolol tartrate 25 MG tablet Commonly known as:  LOPRESSOR TAKE ONE-HALF TABLET BY MOUTH TWICE DAILY   nitroGLYCERIN 0.4 MG SL tablet Commonly known as:  NITROSTAT Place 0.4 mg under the tongue every 5 (five) minutes as needed for chest pain.   rosuvastatin 40 MG tablet Commonly known as:  CRESTOR Take 20 mg by mouth daily.   SYNTHROID 50 MCG tablet Generic drug:  levothyroxine Take 1 tab daily on an empty stomach for thyroid   torsemide 20 MG tablet Commonly known as:   DEMADEX Take 2 tablets (40 mg total) by mouth daily.        Follow-up: Return in about 6 months (around 02/05/2017).  Claretta Fraise, M.D.

## 2016-08-09 ENCOUNTER — Other Ambulatory Visit: Payer: Self-pay | Admitting: Family Medicine

## 2016-08-10 ENCOUNTER — Other Ambulatory Visit: Payer: Self-pay

## 2016-08-10 ENCOUNTER — Ambulatory Visit: Payer: Medicare Other | Admitting: Family Medicine

## 2016-08-10 MED ORDER — ROSUVASTATIN CALCIUM 40 MG PO TABS: 40.0000 mg | ORAL_TABLET | Freq: Every day | ORAL | 1 refills | Status: DC

## 2016-08-15 ENCOUNTER — Ambulatory Visit (INDEPENDENT_AMBULATORY_CARE_PROVIDER_SITE_OTHER): Payer: Medicare Other

## 2016-08-15 ENCOUNTER — Ambulatory Visit (INDEPENDENT_AMBULATORY_CARE_PROVIDER_SITE_OTHER): Payer: Medicare Other | Admitting: Family Medicine

## 2016-08-15 ENCOUNTER — Encounter: Payer: Self-pay | Admitting: Family Medicine

## 2016-08-15 VITALS — BP 134/67 | HR 53 | Temp 98.5°F | Ht 60.0 in | Wt 195.0 lb

## 2016-08-15 DIAGNOSIS — R059 Cough, unspecified: Secondary | ICD-10-CM

## 2016-08-15 DIAGNOSIS — J189 Pneumonia, unspecified organism: Secondary | ICD-10-CM

## 2016-08-15 DIAGNOSIS — J181 Lobar pneumonia, unspecified organism: Secondary | ICD-10-CM

## 2016-08-15 DIAGNOSIS — R05 Cough: Secondary | ICD-10-CM

## 2016-08-15 MED ORDER — AZITHROMYCIN 250 MG PO TABS: ORAL_TABLET | ORAL | 0 refills | Status: DC

## 2016-08-15 MED ORDER — CEFTRIAXONE SODIUM 1 G IJ SOLR
1.0000 g | Freq: Once | INTRAMUSCULAR | Status: AC
  Administered 2016-08-15: 1 g via INTRAMUSCULAR

## 2016-08-15 MED ORDER — AMOXICILLIN-POT CLAVULANATE 875-125 MG PO TABS: 1.0000 | ORAL_TABLET | Freq: Two times a day (BID) | ORAL | 0 refills | Status: DC

## 2016-08-15 NOTE — Progress Notes (Signed)
Subjective:  Patient ID: Sharon Compton, female    DOB: 05-10-1941  Age: 76 y.o. MRN: [id number]  CC: Cough (pt here today c/o cough, body aches, chills and just tired)   HPI NATILEE GAUER presents for Onset 2-3 days ago of feeling achy all over. So tired she just wants to lay around all the time. Symptoms are constant. She's had teeth chattering chills and severe sweats. However she denies fever. She has a productive cough that is moderate. She denies shortness of breath. Symptoms have been worsening over time.   History Esly has a past medical history of A-fib ([place]); Anxiety and depression; Asthma; AVM (arteriovenous malformation) of colon (10/01/2011); CAD (coronary artery disease) (02/2013); COPD (chronic obstructive pulmonary disease) ([place]); Diverticular disease (10/01/2011); Fibromyalgia; GERD (gastroesophageal reflux disease); GI bleed; Hiatal hernia; History of pneumonia; Hypertension; Lymphedema; MI, acute, non ST segment elevation ([place]) (10/02/2011); Peptic ulcer disease; and Peripheral edema.   She has a past surgical history that includes Abdominal hysterectomy (age 20); Cholecystectomy; Abdominal exploration surgery; abd tumor removed; Knee surgery; bladder stent; Esophagogastroduodenoscopy (08/24/2011); Colonoscopy (08/25/2011); Colonoscopy (10/03/2011); Colonoscopy (10/04/2011); transthoracic echocardiogram (09/2011); Cardiovascular stress test (09/2011); Coronary angioplasty (02/2013); Percutaneous coronary stent intervention (pci-s) (02/2013); left and right heart catheterization with coronary angiogram (N/A, 03/03/2013); and Cardiac catheterization (N/A, 01/24/2015).   Her family history includes Cancer in her father; Coronary artery disease in her mother; Diabetes in her mother; Heart failure in her mother; Hypertension in her mother.She reports that she quit smoking about 21 years ago. Her smoking use included Cigarettes. She has a 15.00 pack-year smoking history. She has never used smokeless  tobacco. She reports that she does not drink alcohol or use drugs.    ROS Review of Systems  Constitutional: Positive for appetite change, chills, diaphoresis and fatigue. Negative for fever.  HENT: Positive for congestion. Negative for ear pain, nosebleeds, postnasal drip, rhinorrhea, sinus pressure and sore throat.   Respiratory: Positive for cough. Negative for chest tightness and shortness of breath.   Cardiovascular: Negative for chest pain.  Musculoskeletal: Positive for myalgias.  Skin: Negative for rash.  Neurological: Negative for headaches.    Objective:  BP 134/67   Pulse (!) 53   Temp 98.5 F (36.9 C) (Oral)   Ht 5' (1.524 m)   Wt 195 lb (88.5 kg)   SpO2 98%   BMI 38.08 kg/m   BP Readings from Last 3 Encounters:  08/15/16 134/67  08/08/16 139/60  07/09/16 (!) 121/54    Wt Readings from Last 3 Encounters:  08/15/16 195 lb (88.5 kg)  08/08/16 189 lb (85.7 kg)  07/09/16 197 lb (89.4 kg)     Physical Exam  Constitutional: She is oriented to person, place, and time. She appears well-developed and well-nourished. No distress.  HENT:  Head: Normocephalic and atraumatic.  Right Ear: External ear normal.  Left Ear: External ear normal.  Nose: Nose normal.  Mouth/Throat: Oropharynx is clear and moist.  Eyes: Conjunctivae and EOM are normal. Pupils are equal, round, and reactive to light.  Neck: Normal range of motion. Neck supple. No thyromegaly present.  Cardiovascular: Normal rate, regular rhythm and normal heart sounds.   No murmur heard. Pulmonary/Chest: Effort normal. No respiratory distress. She has no wheezes. She has rales (right base).  Abdominal: Soft. Bowel sounds are normal. She exhibits no distension. There is no tenderness.  Lymphadenopathy:    She has no cervical adenopathy.  Neurological: She is alert and oriented to person, place, and time. She  has normal reflexes.  Skin: Skin is warm and dry.  Psychiatric: She has a normal mood and affect.  Her behavior is normal. Judgment and thought content normal.    Dg Chest Port 1 View  Result Date: 03/07/2015 CLINICAL DATA:  Atrial fibrillation, shortness of breath. EXAM: PORTABLE CHEST - 1 VIEW COMPARISON:  01/21/2015 FINDINGS: There is a moderate right pleural effusion and small left pleural effusion. Heart is borderline in size. Mild vascular congestion. Bibasilar atelectasis. IMPRESSION: Bilateral pleural effusions, right greater than left. Bibasilar atelectasis. Mild vascular congestion. Electronically Signed   By: Rolm Baptise M.D.   On: 03/07/2015 13:26    Assessment & Plan:   Janeka was seen today for cough.  Diagnoses and all orders for this visit:  Cough -     DG Chest 2 View; Future -     cefTRIAXone (ROCEPHIN) injection 1 g; Inject 1 g into the muscle once.  Pneumonia of left upper lobe due to infectious organism ([place]) -     cefTRIAXone (ROCEPHIN) injection 1 g; Inject 1 g into the muscle once. -     CBC with Differential/Platelet -     CMP14+EGFR  Other orders -     amoxicillin-clavulanate (AUGMENTIN) 875-125 MG tablet; Take 1 tablet by mouth 2 (two) times daily. Take all of this medication -     azithromycin (ZITHROMAX Z-PAK) 250 MG tablet; Take two right away Then one a day for the next 4 days.     I am having Ms. Bracey start on amoxicillin-clavulanate and azithromycin. I am also having her maintain her aspirin, nitroGLYCERIN, Naproxen Sodium, amiodarone, carbidopa-levodopa, SYNTHROID, diltiazem, albuterol, metoprolol tartrate, diltiazem, ALPRAZolam, torsemide, CRESTOR, and rosuvastatin. We will continue to administer cefTRIAXone.  Allergies as of 08/15/2016      Reactions   Iohexol Shortness Of Breath   PT STATES CONTRAST ALLERGY CAUSED SHORTNESS OF BREATH IN THE 70'S   Dye Fdc Red [red Dye] Other (See Comments)   Pt.states she passed out   Sulfa Antibiotics Itching   Codeine Itching, Palpitations      Medication List       Accurate as of 08/15/16  4:49  PM. Always use your most recent med list.          albuterol 108 (90 Base) MCG/ACT inhaler Commonly known as:  PROVENTIL HFA;VENTOLIN HFA Inhale 2 puffs into the lungs every 6 (six) hours as needed for wheezing or shortness of breath.   ALEVE 220 MG Caps Generic drug:  Naproxen Sodium Take by mouth as needed. Reported on 09/06/2015   ALPRAZolam 1 MG tablet Commonly known as:  XANAX Take 1 tablet (1 mg total) by mouth 3 (three) times daily.   amiodarone 200 MG tablet Commonly known as:  PACERONE Take 1 tablet (200 mg total) by mouth daily.   amoxicillin-clavulanate 875-125 MG tablet Commonly known as:  AUGMENTIN Take 1 tablet by mouth 2 (two) times daily. Take all of this medication   aspirin 81 MG tablet Take 81 mg by mouth daily.   azithromycin 250 MG tablet Commonly known as:  ZITHROMAX Z-PAK Take two right away Then one a day for the next 4 days.   carbidopa-levodopa 10-100 MG tablet Commonly known as:  SINEMET Take 1 tablet by mouth 3 (three) times daily. For parkinsonism   CRESTOR 40 MG tablet Generic drug:  rosuvastatin TAKE ONE TABLET BY MOUTH ONCE DAILY   rosuvastatin 40 MG tablet Commonly known as:  CRESTOR Take 1 tablet (40 mg total)  by mouth daily.   diltiazem 240 MG 24 hr capsule Commonly known as:  CARDIZEM CD Take 1 capsule (240 mg total) by mouth daily.   diltiazem 360 MG 24 hr capsule Commonly known as:  CARDIZEM CD Take 360 mg by mouth daily.   metoprolol tartrate 25 MG tablet Commonly known as:  LOPRESSOR TAKE ONE-HALF TABLET BY MOUTH TWICE DAILY   nitroGLYCERIN 0.4 MG SL tablet Commonly known as:  NITROSTAT Place 0.4 mg under the tongue every 5 (five) minutes as needed for chest pain.   SYNTHROID 50 MCG tablet Generic drug:  levothyroxine Take 1 tab daily on an empty stomach for thyroid   torsemide 20 MG tablet Commonly known as:  DEMADEX Take 2 tablets (40 mg total) by mouth daily.        Follow-up: Return in about 7 days  (around 08/22/2016).  Claretta Fraise, M.D.

## 2016-08-16 LAB — CMP14+EGFR
ALBUMIN: 4.2 g/dL (ref 3.5–4.8)
ALK PHOS: 93 IU/L (ref 39–117)
ALT: 24 IU/L (ref 0–32)
AST: 25 IU/L (ref 0–40)
Albumin/Globulin Ratio: 1.4 (ref 1.2–2.2)
BUN / CREAT RATIO: 12 (ref 12–28)
BUN: 14 mg/dL (ref 8–27)
Bilirubin Total: 0.2 mg/dL (ref 0.0–1.2)
CALCIUM: 9 mg/dL (ref 8.7–10.3)
CO2: 25 mmol/L (ref 18–29)
CREATININE: 1.2 mg/dL — AB (ref 0.57–1.00)
Chloride: 100 mmol/L (ref 96–106)
GFR calc Af Amer: 51 — ABNORMAL LOW (ref >59)
GFR, EST NON AFRICAN AMERICAN: 44 — AB (ref >59)
GLUCOSE: 84 mg/dL (ref 65–99)
Globulin, Total: 2.9 (ref 1.5–4.5)
Potassium: 3.9 mmol/L (ref 3.5–5.2)
Sodium: 143 mmol/L (ref 134–144)
Total Protein: 7.1 g/dL (ref 6.0–8.5)

## 2016-08-16 LAB — CBC WITH DIFFERENTIAL/PLATELET
BASOS ABS: 0.1 10*3/uL (ref 0.0–0.2)
Basos: 1 %
EOS (ABSOLUTE): 0.1 10*3/uL (ref 0.0–0.4)
Eos: 1 %
HEMOGLOBIN: 10.5 g/dL — AB (ref 11.1–15.9)
Hematocrit: 34.7 % (ref 34.0–46.6)
IMMATURE GRANS (ABS): 0 10*3/uL (ref 0.0–0.1)
IMMATURE GRANULOCYTES: 0 %
LYMPHS ABS: 2 10*3/uL (ref 0.7–3.1)
LYMPHS: 36 %
MCH: 22.7 pg — ABNORMAL LOW (ref 26.6–33.0)
MCHC: 30.3 g/dL — AB (ref 31.5–35.7)
MCV: 75 fL — ABNORMAL LOW (ref 79–97)
MONOCYTES: 8 %
Monocytes Absolute: 0.4 10*3/uL (ref 0.1–0.9)
Neutrophils Absolute: 3 10*3/uL (ref 1.4–7.0)
Neutrophils: 54 %
Platelets: 230 10*3/uL (ref 150–379)
RBC: 4.63 x10E6/uL (ref 3.77–5.28)
RDW: 18.3 % — ABNORMAL HIGH (ref 12.3–15.4)
WBC: 5.5 10*3/uL (ref 3.4–10.8)

## 2016-08-23 ENCOUNTER — Telehealth: Payer: Self-pay | Admitting: *Deleted

## 2016-08-23 NOTE — Telephone Encounter (Signed)
Pt notified of recommendation Verbalizes understanding States GI upset is improving

## 2016-08-23 NOTE — Telephone Encounter (Signed)
How many times a day diarrhea? Antibiotics likely the cause. She should stay on them through the treatment course to make sure pneumonia getting better, though she should be just about done. If having abdominal pain, fevers, any worsening symptoms, if diarrhea not improving, she should be seen.

## 2016-08-23 NOTE — Telephone Encounter (Signed)
Pt saw Dr Livia Snellen on 08/15/2016 Is being treated for pneumonia according to note Pt is currently taking Augmentin and finished Zpac on Monday Now is having multiple episodes of diarrhea Per pt cannot come in for OV Please advise

## 2016-09-20 IMAGING — CR DG CHEST 1V PORT
1 series · 1 of 1 positions shown · non-contrast
Comparison: 01/21/2015

CLINICAL DATA: Atrial fibrillation, shortness of breath.

EXAM:
PORTABLE CHEST - 1 VIEW

[AP]
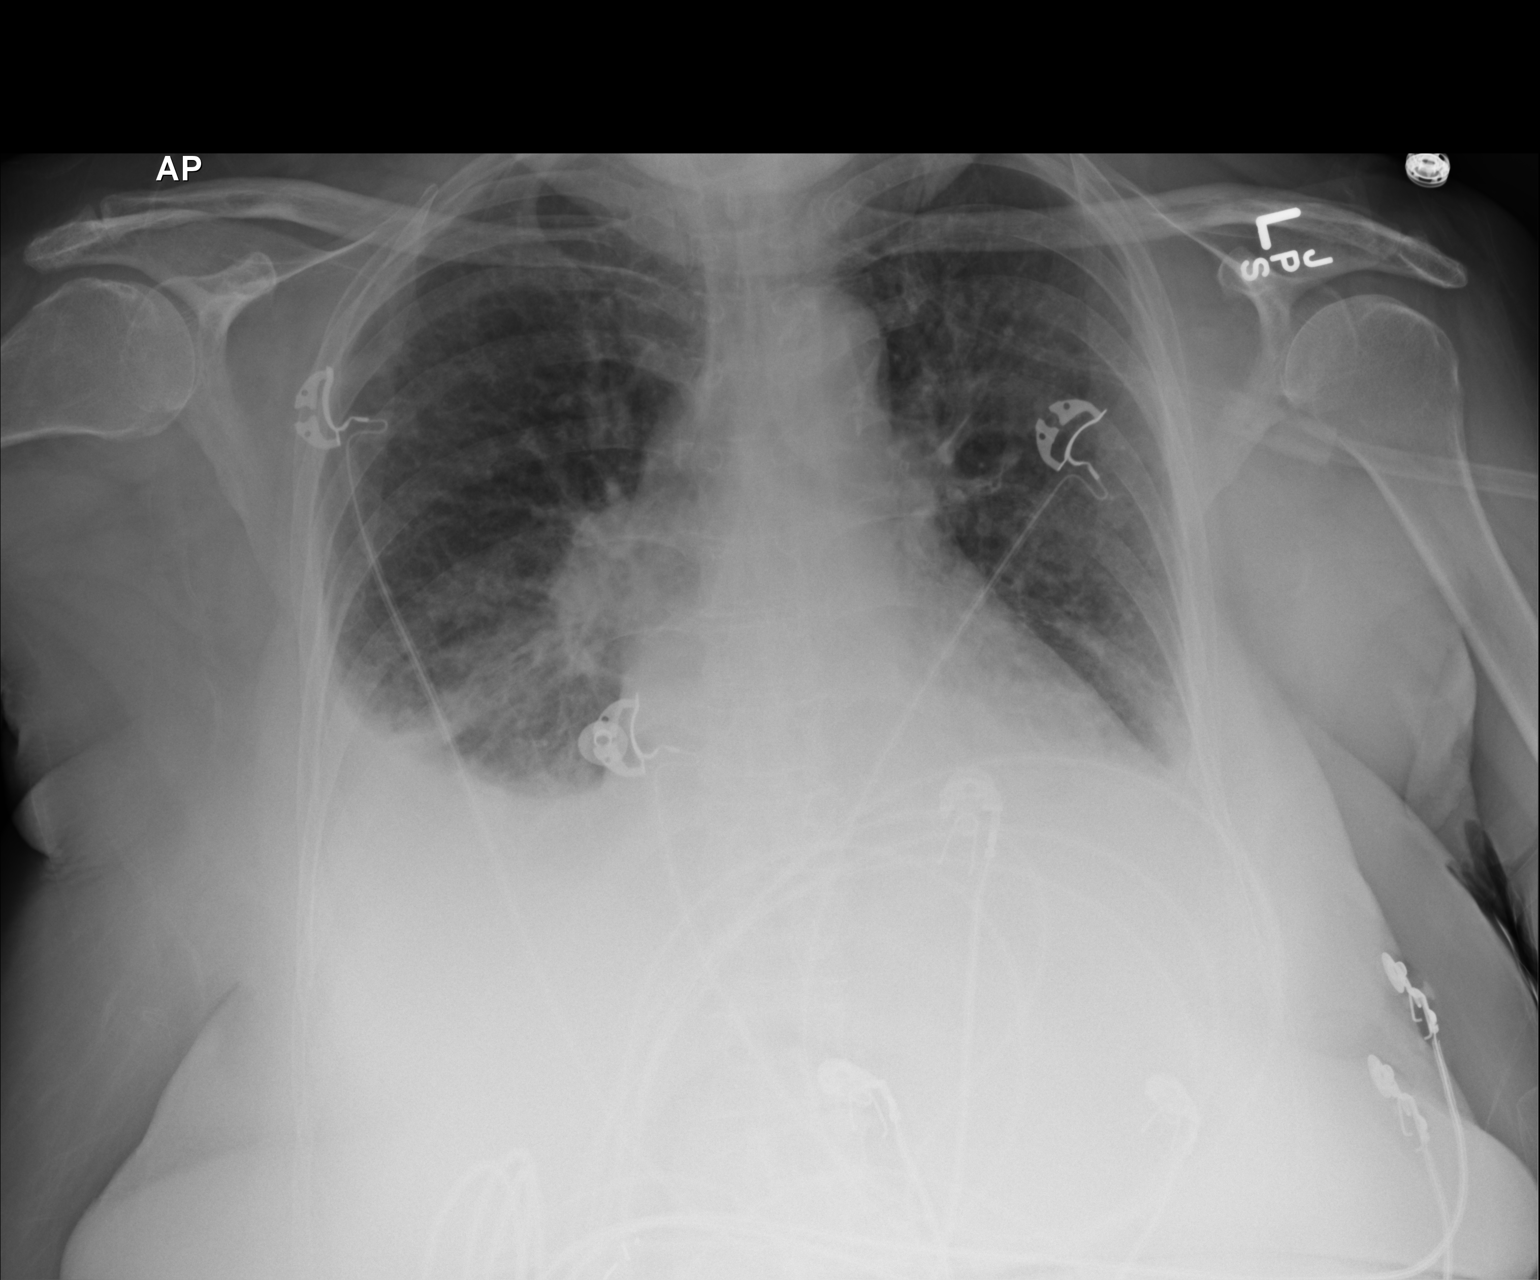

[1 of 1 positions shown; findings below may reference images not displayed]

FINDINGS: There is a moderate right pleural effusion and small left pleural
effusion. Heart is borderline in size. Mild vascular congestion.
Bibasilar atelectasis.
IMPRESSION: Bilateral pleural effusions, right greater than left. Bibasilar
atelectasis.

Mild vascular congestion.

## 2016-09-25 ENCOUNTER — Encounter: Payer: Self-pay | Admitting: Family Medicine

## 2016-09-25 ENCOUNTER — Ambulatory Visit (INDEPENDENT_AMBULATORY_CARE_PROVIDER_SITE_OTHER): Payer: Medicare Other | Admitting: Family Medicine

## 2016-09-25 VITALS — BP 143/68 | HR 48 | Temp 97.3°F | Ht 60.0 in | Wt 194.0 lb

## 2016-09-25 DIAGNOSIS — E039 Hypothyroidism, unspecified: Secondary | ICD-10-CM

## 2016-09-25 MED ORDER — SYNTHROID 50 MCG PO TABS: ORAL_TABLET | ORAL | 1 refills | Status: DC

## 2016-09-25 NOTE — Progress Notes (Signed)
Subjective:  Patient ID: Sharon Compton, female    DOB: 1940/11/28  Age: 76 y.o. MRN: [id number]  CC: Skin Problem (pt here today c/o a "place" on her back that hurts)   HPI Sharon Compton presents for Lesion for several days on the right side of her back at the area of the angle of the scapula. This is covered by the bra. She would also like her thyroid blood test performed.  History [name] has a past medical history of A-fib ([place]); Anxiety and depression; Asthma; AVM (arteriovenous malformation) of colon (10/01/2011); CAD (coronary artery disease) (02/2013); COPD (chronic obstructive pulmonary disease) ([place]); Diverticular disease (10/01/2011); Fibromyalgia; GERD (gastroesophageal reflux disease); GI bleed; Hiatal hernia; History of pneumonia; Hypertension; Lymphedema; MI, acute, non ST segment elevation ([place]) (10/02/2011); Peptic ulcer disease; and Peripheral edema.   She has a past surgical history that includes Abdominal hysterectomy (age 88); Cholecystectomy; Abdominal exploration surgery; abd tumor removed; Knee surgery; bladder stent; Esophagogastroduodenoscopy (08/24/2011); Colonoscopy (08/25/2011); Colonoscopy (10/03/2011); Colonoscopy (10/04/2011); transthoracic echocardiogram (09/2011); Cardiovascular stress test (09/2011); Coronary angioplasty (02/2013); Percutaneous coronary stent intervention (pci-s) (02/2013); left and right heart catheterization with coronary angiogram (N/A, 03/03/2013); and Cardiac catheterization (N/A, 01/24/2015).   Her family history includes Cancer in her father; Coronary artery disease in her mother; Diabetes in her mother; Heart failure in her mother; Hypertension in her mother.She reports that she quit smoking about 21 years ago. Her smoking use included Cigarettes. She has a 15.00 pack-year smoking history. She has never used smokeless tobacco. She reports that she does not drink alcohol or use drugs.  Current Outpatient Prescriptions on File Prior to Visit  Medication Sig  Dispense Refill  . albuterol (PROVENTIL HFA;VENTOLIN HFA) 108 (90 Base) MCG/ACT inhaler Inhale 2 puffs into the lungs every 6 (six) hours as needed for wheezing or shortness of breath. 1 Inhaler 11  . ALPRAZolam (XANAX) 1 MG tablet Take 1 tablet (1 mg total) by mouth 3 (three) times daily. 90 tablet 5  . amiodarone (PACERONE) 200 MG tablet Take 1 tablet (200 mg total) by mouth daily. 90 tablet 3  . aspirin 81 MG tablet Take 81 mg by mouth daily.    . CRESTOR 40 MG tablet TAKE ONE TABLET BY MOUTH ONCE DAILY 30 tablet 0  . diltiazem (CARDIZEM CD) 360 MG 24 hr capsule Take 360 mg by mouth daily.    . metoprolol tartrate (LOPRESSOR) 25 MG tablet TAKE ONE-HALF TABLET BY MOUTH TWICE DAILY 90 tablet 1  . Naproxen Sodium (ALEVE) 220 MG CAPS Take by mouth as needed. Reported on 09/06/2015    . nitroGLYCERIN (NITROSTAT) 0.4 MG SL tablet Place 0.4 mg under the tongue every 5 (five) minutes as needed for chest pain.    . rosuvastatin (CRESTOR) 40 MG tablet Take 1 tablet (40 mg total) by mouth daily. 90 tablet 1  . torsemide (DEMADEX) 20 MG tablet Take 2 tablets (40 mg total) by mouth daily. 180 tablet 1  . carbidopa-levodopa (SINEMET) 10-100 MG tablet Take 1 tablet by mouth 3 (three) times daily. For parkinsonism (Patient not taking: Reported on 08/08/2016) 90 tablet 5   No current facility-administered medications on file prior to visit.     ROS Review of Systems  Constitutional: Negative for chills, diaphoresis and fever.    Objective:  BP (!) 143/68   Pulse (!) 48   Temp 97.3 F (36.3 C) (Oral)   Ht 5' (1.524 m)   Wt 194 lb (88 kg)   BMI 37.89 kg/m  Physical Exam  Skin:  1 x 2 cm excoriation/abrasion without surrounding erythema noted at the angle of the scapula on the right.    Assessment & Plan:   Sharon Compton was seen today for skin problem.  Diagnoses and all orders for this visit:  Hypothyroidism, unspecified type -     TSH + free T4  Other orders -     SYNTHROID 50 MCG tablet;  Take 1 tab daily on an empty stomach for thyroid   I have discontinued Sharon Compton's amoxicillin-clavulanate and azithromycin. I am also having her maintain her aspirin, nitroGLYCERIN, Naproxen Sodium, amiodarone, carbidopa-levodopa, albuterol, metoprolol tartrate, diltiazem, ALPRAZolam, torsemide, CRESTOR, rosuvastatin, and SYNTHROID.  Meds ordered this encounter  Medications  . SYNTHROID 50 MCG tablet    Sig: Take 1 tab daily on an empty stomach for thyroid    Dispense:  90 tablet    Refill:  1   She should moderate attended that patient should use Neosporin and gauze dressing and cleaning the wound carefully daily. Reassured that no serious condition appears present. Wound care instruction as well as signs and symptoms of infection reviewed.  Follow-up: Return in about 6 months (around 03/27/2017), or If signs and symptoms of infection occur, for Hypothyroidism.  Claretta Fraise, M.D.

## 2016-10-10 ENCOUNTER — Ambulatory Visit: Payer: Medicare Other | Admitting: Family Medicine

## 2016-10-19 ENCOUNTER — Telehealth: Payer: Self-pay | Admitting: Cardiovascular Disease

## 2016-10-19 NOTE — Telephone Encounter (Signed)
Please give Sterling Big a call @ [phone] concerning the pt's diltiazem (CARDIZEM CD) 360 MG 24 hr capsule [[id number]]

## 2016-10-19 NOTE — Telephone Encounter (Signed)
Pharmacy states that pt called c/o dizziness and weakness. When asked dose of Diltiazem pt was taking she reported that she was taking both 240 mg and 360 mg daily. Pt was then instructed to throw away both bottles of Diltiazem. Pharmacy would like to know what dose to refill. On 04/11/16 patient was decrease from 360 mg to 240 mg by our office. At 2/14 office visit with PCP Diltiazem 240 mg and 360 mg listed on current med list. At 09/25/16 PCP office visit Rx called in for Diltiazem 360 mg. Please advise.

## 2016-10-22 MED ORDER — DILTIAZEM HCL ER COATED BEADS 240 MG PO CP24: 240.0000 mg | ORAL_CAPSULE | Freq: Every day | ORAL | 6 refills | Status: DC

## 2016-10-22 NOTE — Telephone Encounter (Signed)
Spoke with pt, she understands to take 240 mg Diltiazem daily.

## 2016-10-22 NOTE — Telephone Encounter (Signed)
Order placed. Called pt no answer, left message to call back.

## 2016-10-22 NOTE — Telephone Encounter (Signed)
240 mg daily

## 2016-10-22 NOTE — Addendum Note (Signed)
Addended by: Levonne Hubert on: 10/22/2016 09:42 AM   Modules accepted: Orders

## 2016-11-16 ENCOUNTER — Ambulatory Visit (INDEPENDENT_AMBULATORY_CARE_PROVIDER_SITE_OTHER): Payer: Medicare Other | Admitting: Nurse Practitioner

## 2016-11-16 ENCOUNTER — Encounter: Payer: Self-pay | Admitting: Nurse Practitioner

## 2016-11-16 VITALS — BP 180/71 | HR 52 | Temp 97.1°F | Ht 60.0 in | Wt 190.0 lb

## 2016-11-16 DIAGNOSIS — K047 Periapical abscess without sinus: Secondary | ICD-10-CM | POA: Diagnosis not present

## 2016-11-16 MED ORDER — CLINDAMYCIN HCL 300 MG PO CAPS: 300.0000 mg | ORAL_CAPSULE | Freq: Four times a day (QID) | ORAL | 0 refills | Status: DC

## 2016-11-16 NOTE — Progress Notes (Signed)
   Subjective:    Patient ID: Sharon Compton, female    DOB: 01-14-1941, 76 y.o.   MRN: [id number]  HPI Patient brings herself in today C/O filling falling out of molar on upper right side. Chhek is swollen and hurts to eat anything.    Review of Systems  Constitutional: Positive for fever (low grade she thinks last night). Negative for chills.  HENT: Positive for dental problem. Negative for congestion, sinus pain and trouble swallowing.   Respiratory: Negative for cough.   Cardiovascular: Negative.   Neurological: Negative.   Psychiatric/Behavioral: Negative.   All other systems reviewed and are negative.      Objective:   Physical Exam  Constitutional: She is oriented to person, place, and time. She appears well-developed and well-nourished. No distress.  HENT:  Right Ear: External ear normal.  Left Ear: External ear normal.  Nose: Nose normal.  Mouth/Throat: Oropharynx is clear and moist.  Right upper first molar has missing filling with erythematous mucosa surrounding. Right cheek swollen and tender to touch  Pulmonary/Chest: Effort normal and breath sounds normal.  Neurological: She is alert and oriented to person, place, and time.  Skin: Skin is warm.  Psychiatric: She has a normal mood and affect. Her behavior is normal. Judgment and thought content normal.   BP (!) 180/71   Pulse (!) 52   Temp 97.1 F (36.2 C) (Oral)   Ht 5' (1.524 m)   Wt 190 lb (86.2 kg)   BMI 37.11 kg/m      Assessment & Plan:   1. Abscessed tooth    Meds ordered this encounter  Medications  . clindamycin (CLEOCIN) 300 MG capsule    Sig: Take 1 capsule (300 mg total) by mouth 4 (four) times daily.    Dispense:  40 capsule    Refill:  0    Order Specific Question:   Supervising Provider    Answer:   Eustaquio Maize [[id number]]   Needs  To see dentist ASAP oragel for pain RTO prn  Mary-Margaret Hassell Done, FNP

## 2016-11-16 NOTE — Patient Instructions (Signed)
Dental Abscess A dental abscess is pus in or around a tooth. Follow these instructions at home:  Take medicines only as told by your dentist.  If you were prescribed antibiotic medicine, finish all of it even if you start to feel better.  Rinse your mouth (gargle) often with salt water.  Do not drive or use heavy machinery, like a lawn mower, while taking pain medicine.  Do not apply heat to the outside of your mouth.  Keep all follow-up visits as told by your dentist. This is important. Contact a doctor if:  Your pain is worse, and medicine does not help. Get help right away if:  You have a fever or chills.  Your symptoms suddenly get worse.  You have a very bad headache.  You have problems breathing or swallowing.  You have trouble opening your mouth.  You have puffiness (swelling) in your neck or around your eye. This information is not intended to replace advice given to you by your health care provider. Make sure you discuss any questions you have with your health care provider. Document Released: 10/26/2014 Document Revised: 11/17/2015 Document Reviewed: 06/08/2014 Elsevier Interactive Patient Education  2017 Elsevier Inc.  

## 2016-11-20 ENCOUNTER — Ambulatory Visit: Payer: Medicare Other | Admitting: Cardiovascular Disease

## 2017-01-04 ENCOUNTER — Other Ambulatory Visit: Payer: Self-pay | Admitting: Family Medicine

## 2017-01-28 ENCOUNTER — Ambulatory Visit (INDEPENDENT_AMBULATORY_CARE_PROVIDER_SITE_OTHER): Payer: Medicare Other | Admitting: Family

## 2017-01-28 ENCOUNTER — Encounter: Payer: Self-pay | Admitting: Family

## 2017-01-28 VITALS — BP 165/73 | HR 53 | Temp 97.3°F | Ht 60.0 in | Wt 185.2 lb

## 2017-01-28 DIAGNOSIS — I509 Heart failure, unspecified: Secondary | ICD-10-CM

## 2017-01-28 DIAGNOSIS — I2510 Atherosclerotic heart disease of native coronary artery without angina pectoris: Secondary | ICD-10-CM

## 2017-01-28 DIAGNOSIS — R6889 Other general symptoms and signs: Secondary | ICD-10-CM | POA: Diagnosis not present

## 2017-01-28 DIAGNOSIS — D509 Iron deficiency anemia, unspecified: Secondary | ICD-10-CM | POA: Diagnosis not present

## 2017-01-28 DIAGNOSIS — I10 Essential (primary) hypertension: Secondary | ICD-10-CM | POA: Diagnosis not present

## 2017-01-28 DIAGNOSIS — R609 Edema, unspecified: Secondary | ICD-10-CM | POA: Diagnosis not present

## 2017-01-28 DIAGNOSIS — R5383 Other fatigue: Secondary | ICD-10-CM

## 2017-01-28 DIAGNOSIS — F411 Generalized anxiety disorder: Secondary | ICD-10-CM | POA: Diagnosis not present

## 2017-01-28 DIAGNOSIS — E039 Hypothyroidism, unspecified: Secondary | ICD-10-CM | POA: Diagnosis not present

## 2017-01-28 DIAGNOSIS — R600 Localized edema: Secondary | ICD-10-CM | POA: Insufficient documentation

## 2017-01-28 MED ORDER — ALPRAZOLAM 1 MG PO TABS: 1.0000 mg | ORAL_TABLET | Freq: Three times a day (TID) | ORAL | 5 refills | Status: DC

## 2017-01-28 NOTE — Patient Instructions (Signed)

## 2017-01-28 NOTE — Progress Notes (Signed)
Subjective:    Patient ID: Sharon Compton, female    DOB: 10-06-1940, 76 y.o.   MRN: [id number]  Pt states she has had SOB for a year now. Pt was diagnosed with CHF in 2016 and is followed by Cardiologists, but has not see them since 04/11/16. PT states her fluids is worse. PT is 5 lbs less than her visit on 11/16/16.   She takes Demadex 40 mg daily.   Pt is complaining of fatigue and hard for her to walk room to room.  Shortness of Breath  This is a recurrent problem. The current episode started more than 1 year ago. The problem occurs constantly. The problem has been waxing and waning. Associated symptoms include chest pain ("a little"). Her past medical history is significant for a heart failure.  Hypertension  This is a chronic problem. The current episode started more than 1 year ago. The problem has been waxing and waning since onset. The problem is uncontrolled. Associated symptoms include anxiety, chest pain ("a little"), malaise/fatigue, peripheral edema and shortness of breath. The current treatment provides mild improvement. Hypertensive end-organ damage includes CAD/MI and heart failure. Identifiable causes of hypertension include a thyroid problem.  Thyroid Problem  Presents for follow-up visit. Symptoms include anxiety and fatigue. Patient reports no constipation, diarrhea, tremors or weight gain. The symptoms have been stable. Her past medical history is significant for heart failure.  Anemia  Presents for follow-up visit. Symptoms include leg swelling and malaise/fatigue. There has been no bruising/bleeding easily. Past medical history includes heart failure.  Anxiety  Presents for follow-up visit. Symptoms include chest pain ("a little"), excessive worry, irritability, nervous/anxious behavior and shortness of breath. Symptoms occur most days.   Her past medical history is significant for anemia.      Review of Systems  Constitutional: Positive for fatigue, irritability  and malaise/fatigue. Negative for weight gain.  Respiratory: Positive for shortness of breath.   Cardiovascular: Positive for chest pain ("a little").  Gastrointestinal: Negative for constipation and diarrhea.  Neurological: Negative for tremors.  Hematological: Does not bruise/bleed easily.  Psychiatric/Behavioral: The patient is nervous/anxious.   All other systems reviewed and are negative.      Objective:   Physical Exam  Constitutional: She is oriented to person, place, and time. She appears well-developed and well-nourished. No distress.  HENT:  Head: Normocephalic.  Eyes: Pupils are equal, round, and reactive to light.  Neck: Normal range of motion. Neck supple. No thyromegaly present.  Cardiovascular: Normal rate, regular rhythm, normal heart sounds and intact distal pulses.   No murmur heard. Pulmonary/Chest: Effort normal and breath sounds normal. No respiratory distress. She has no wheezes.  Abdominal: Soft. Bowel sounds are normal. She exhibits no distension. There is no tenderness.  Musculoskeletal: Normal range of motion. She exhibits edema (BLE). She exhibits no tenderness.  Neurological: She is alert and oriented to person, place, and time.  Skin: Skin is warm and dry.  Psychiatric: She has a normal mood and affect. Her behavior is normal. Judgment and thought content normal.  Vitals reviewed.     BP (!) 165/73   Pulse (!) 53   Temp (!) 97.3 F (36.3 C) (Oral)   Ht 5' (1.524 m)   Wt 185 lb 3.2 oz (84 kg)   SpO2 97%   BMI 36.17 kg/m      Assessment & Plan:  1. Coronary artery disease involving native coronary artery of native heart without angina pectoris - CMP14+EGFR  2. Congestive  heart failure, unspecified HF chronicity, unspecified heart failure type ([place]) - CMP14+EGFR  3. Hypertension, unspecified type - CMP14+EGFR  4. Fatigue, unspecified type - CMP14+EGFR - Thyroid Panel With TSH - Anemia Profile B  5. Peripheral edema Compression hose    Low salt diet Weight daily!!! - CMP14+EGFR  6. Hypothyroidism, unspecified type - CMP14+EGFR - Thyroid Panel With TSH  7. Morbid obesity ([place]) - CMP14+EGFR  8. Iron deficiency anemia, unspecified iron deficiency anemia type - CMP14+EGFR - Anemia Profile B  9. GAD (generalized anxiety disorder) - ALPRAZolam (XANAX) 1 MG tablet; Take 1 tablet (1 mg total) by mouth 3 (three) times daily.  Dispense: 90 tablet; Refill: 5   Continue all meds and call Cardiologists and make appt asap! Labs pending Health Maintenance reviewed Diet and exercise encouraged RTO 1 months for chronic follow up and recheck BP  Evelina Dun, FNP

## 2017-01-29 ENCOUNTER — Telehealth: Payer: Self-pay | Admitting: Family

## 2017-01-29 ENCOUNTER — Other Ambulatory Visit: Payer: Self-pay | Admitting: Family

## 2017-01-29 LAB — CMP14+EGFR
ALBUMIN: 4.1 g/dL (ref 3.5–4.8)
ALK PHOS: 87 IU/L (ref 39–117)
ALT: 30 IU/L (ref 0–32)
AST: 37 IU/L (ref 0–40)
Albumin/Globulin Ratio: 1.5 (ref 1.2–2.2)
BUN / CREAT RATIO: 11 — AB (ref 12–28)
BUN: 15 mg/dL (ref 8–27)
Bilirubin Total: 0.2 mg/dL (ref 0.0–1.2)
CHLORIDE: 103 mmol/L (ref 96–106)
CO2: 25 mmol/L (ref 20–29)
CREATININE: 1.31 mg/dL — AB (ref 0.57–1.00)
Calcium: 8.8 mg/dL (ref 8.7–10.3)
GFR calc Af Amer: 46 mL/min/{1.73_m2} — ABNORMAL LOW (ref >59)
GFR calc non Af Amer: 40 mL/min/{1.73_m2} — ABNORMAL LOW (ref >59)
GLUCOSE: 104 mg/dL — AB (ref 65–99)
Globulin, Total: 2.7 g/dL (ref 1.5–4.5)
Potassium: 4.1 mmol/L (ref 3.5–5.2)
Sodium: 143 mmol/L (ref 134–144)
TOTAL PROTEIN: 6.8 g/dL (ref 6.0–8.5)

## 2017-01-29 LAB — ANEMIA PROFILE B
BASOS ABS: 0.1 10*3/uL (ref 0.0–0.2)
Basos: 1 %
EOS (ABSOLUTE): 0 10*3/uL (ref 0.0–0.4)
Eos: 1 %
Ferritin: 10 ng/mL — ABNORMAL LOW (ref 15–150)
Folate: 11 ng/mL (ref >3.0)
Hematocrit: 34.7 % (ref 34.0–46.6)
Hemoglobin: 10.1 g/dL — ABNORMAL LOW (ref 11.1–15.9)
IMMATURE GRANS (ABS): 0 10*3/uL (ref 0.0–0.1)
IMMATURE GRANULOCYTES: 0 %
IRON: 22 ug/dL — AB (ref 27–139)
Iron Saturation: 5 % — CL (ref 15–55)
LYMPHS: 40 %
Lymphocytes Absolute: 2.1 10*3/uL (ref 0.7–3.1)
MCH: 21.9 pg — ABNORMAL LOW (ref 26.6–33.0)
MCHC: 29.1 g/dL — ABNORMAL LOW (ref 31.5–35.7)
MCV: 75 fL — AB (ref 79–97)
MONOCYTES: 6 %
Monocytes Absolute: 0.3 10*3/uL (ref 0.1–0.9)
NEUTROS PCT: 52 %
Neutrophils Absolute: 2.6 10*3/uL (ref 1.4–7.0)
PLATELETS: 179 10*3/uL (ref 150–379)
RBC: 4.62 x10E6/uL (ref 3.77–5.28)
RDW: 19.3 % — AB (ref 12.3–15.4)
RETIC CT PCT: 2.5 % (ref 0.6–2.6)
TIBC: 403 ug/dL (ref 250–450)
UIBC: 381 ug/dL — AB (ref 118–369)
VITAMIN B 12: 595 pg/mL (ref 232–1245)
WBC: 5.2 10*3/uL (ref 3.4–10.8)

## 2017-01-29 LAB — THYROID PANEL WITH TSH
FREE THYROXINE INDEX: 1.8 (ref 1.2–4.9)
T3 UPTAKE RATIO: 26 % (ref 24–39)
T4, Total: 7 ug/dL (ref 4.5–12.0)
TSH: 9.86 u[IU]/mL — AB (ref 0.450–4.500)

## 2017-01-29 MED ORDER — LEVOTHYROXINE SODIUM 75 MCG PO TABS: 75.0000 ug | ORAL_TABLET | Freq: Every day | ORAL | 11 refills | Status: DC

## 2017-01-29 MED ORDER — POLYSACCHARIDE IRON COMPLEX 150 MG PO CAPS: 150.0000 mg | ORAL_CAPSULE | Freq: Every day | ORAL | 1 refills | Status: DC

## 2017-01-29 NOTE — Telephone Encounter (Signed)
Please review and advise.

## 2017-01-31 NOTE — Telephone Encounter (Signed)
Notified pharmacy to DAW and increased dose

## 2017-01-31 NOTE — Telephone Encounter (Signed)
We increased Levothyroxine to 75 mcg. If pt usually receives brand name please continue that. Thanks!

## 2017-02-19 ENCOUNTER — Other Ambulatory Visit: Payer: Self-pay | Admitting: Family

## 2017-02-19 DIAGNOSIS — E039 Hypothyroidism, unspecified: Secondary | ICD-10-CM

## 2017-02-19 MED ORDER — LEVOTHYROXINE SODIUM 50 MCG PO TABS: 50.0000 ug | ORAL_TABLET | Freq: Every day | ORAL | 11 refills | Status: DC

## 2017-02-19 NOTE — Progress Notes (Signed)
Will change levothyroxine back to 50 mcg since pt has "rash and bumps" with 75 mcg. We will recheck TSH in 2 months

## 2017-02-19 NOTE — Addendum Note (Signed)
Addended byCarrolyn Leigh on: 02/19/2017 04:12 PM   Modules accepted: Orders

## 2017-02-19 NOTE — Progress Notes (Signed)
Pt aware. Future TSH ORDER PUT IN

## 2017-02-28 ENCOUNTER — Ambulatory Visit: Payer: Medicare Other | Admitting: Family

## 2017-03-15 ENCOUNTER — Encounter: Payer: Self-pay | Admitting: *Deleted

## 2017-03-15 ENCOUNTER — Telehealth: Payer: Self-pay | Admitting: Cardiovascular Disease

## 2017-03-15 ENCOUNTER — Ambulatory Visit (INDEPENDENT_AMBULATORY_CARE_PROVIDER_SITE_OTHER): Payer: Medicare Other | Admitting: Cardiovascular Disease

## 2017-03-15 ENCOUNTER — Encounter: Payer: Self-pay | Admitting: Cardiovascular Disease

## 2017-03-15 VITALS — BP 192/82 | HR 56 | Ht 60.0 in | Wt 184.0 lb

## 2017-03-15 DIAGNOSIS — I2510 Atherosclerotic heart disease of native coronary artery without angina pectoris: Secondary | ICD-10-CM

## 2017-03-15 DIAGNOSIS — R5383 Other fatigue: Secondary | ICD-10-CM | POA: Diagnosis not present

## 2017-03-15 DIAGNOSIS — R079 Chest pain, unspecified: Secondary | ICD-10-CM

## 2017-03-15 DIAGNOSIS — I4891 Unspecified atrial fibrillation: Secondary | ICD-10-CM

## 2017-03-15 DIAGNOSIS — I10 Essential (primary) hypertension: Secondary | ICD-10-CM

## 2017-03-15 DIAGNOSIS — I5032 Chronic diastolic (congestive) heart failure: Secondary | ICD-10-CM | POA: Diagnosis not present

## 2017-03-15 DIAGNOSIS — R0609 Other forms of dyspnea: Secondary | ICD-10-CM | POA: Diagnosis not present

## 2017-03-15 MED ORDER — LOSARTAN POTASSIUM 25 MG PO TABS: 25.0000 mg | ORAL_TABLET | Freq: Every day | ORAL | 1 refills | Status: DC
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
## 2017-03-15 NOTE — Telephone Encounter (Signed)
Pre-cert Verification for the following procedure   Lexiscan scheduled 03-22-17

## 2017-03-15 NOTE — Patient Instructions (Signed)
Your physician recommends that you schedule a follow-up appointment in: [place] has recommended you make the following change in your medication:   START LOSARTAN [address] [place] has requested that you have a lexiscan myoview. For further information please visit [email or web address]. Please follow instruction sheet, as given.   Your physician recommends that you return for lab work in: 1 WEEK BMP  Thank you for choosing [facility]!!

## 2017-03-19 ENCOUNTER — Other Ambulatory Visit: Payer: Medicare Other

## 2017-03-19 ENCOUNTER — Other Ambulatory Visit: Payer: Self-pay

## 2017-03-19 DIAGNOSIS — I4891 Unspecified atrial fibrillation: Secondary | ICD-10-CM

## 2017-03-20 ENCOUNTER — Telehealth: Payer: Self-pay | Admitting: Cardiovascular Disease

## 2017-03-20 LAB — PLEASE NOTE

## 2017-03-20 LAB — BASIC METABOLIC PANEL
BUN/Creatinine Ratio: 11 — ABNORMAL LOW (ref 12–28)
BUN: 13 mg/dL (ref 8–27)
CHLORIDE: 101 mmol/L (ref 96–106)
CO2: 26 mmol/L (ref 20–29)
CREATININE: 1.16 mg/dL — AB (ref 0.57–1.00)
Calcium: 9 mg/dL (ref 8.7–10.3)
GFR calc Af Amer: 53 mL/min/{1.73_m2} — ABNORMAL LOW (ref >59)
GFR calc non Af Amer: 46 mL/min/{1.73_m2} — ABNORMAL LOW (ref >59)
GLUCOSE: 97 mg/dL (ref 65–99)
Potassium: 3.9 mmol/L (ref 3.5–5.2)
Sodium: 140 mmol/L (ref 134–144)

## 2017-03-20 NOTE — Telephone Encounter (Signed)
Spoke with pt and is refusing to do up coming stress test. Says she had test done before and she couldn't tolerate going through test again. Says she wanted to make Dr Bronson Ing aware - also will have schedulers cx test

## 2017-03-20 NOTE — Telephone Encounter (Signed)
Sharon Compton called stating that she cannot do the upcoming stress test that has been ordered.  Please call patient.

## 2017-03-22 ENCOUNTER — Inpatient Hospital Stay (HOSPITAL_COMMUNITY): Admission: RE | Admit: 2017-03-22 | Payer: Medicare Other | Source: Ambulatory Visit

## 2017-03-22 ENCOUNTER — Encounter (HOSPITAL_COMMUNITY): Payer: Medicare Other

## 2017-03-27 ENCOUNTER — Telehealth: Payer: Self-pay | Admitting: *Deleted

## 2017-03-27 NOTE — Telephone Encounter (Signed)
Notes recorded by Laurine Blazer, LPN on 81/0/1751 at 0:25 PM EDT Patient notified. Copy to pmd. ------  Notes recorded by Herminio Commons, MD on 03/20/2017 at 8:45 AM EDT Stage 3 CKD is stable.

## 2017-04-09 ENCOUNTER — Other Ambulatory Visit: Payer: Self-pay | Admitting: Family Medicine

## 2017-04-10 ENCOUNTER — Other Ambulatory Visit: Payer: Self-pay | Admitting: Family

## 2017-04-10 NOTE — Telephone Encounter (Signed)
Pt notified of RX  verbalizes understanding

## 2017-04-16 ENCOUNTER — Encounter: Payer: Self-pay | Admitting: Family

## 2017-04-16 ENCOUNTER — Ambulatory Visit (INDEPENDENT_AMBULATORY_CARE_PROVIDER_SITE_OTHER): Payer: Medicare Other | Admitting: Family

## 2017-04-16 VITALS — BP 123/66 | HR 55 | Temp 98.4°F | Ht 60.0 in | Wt 185.0 lb

## 2017-04-16 DIAGNOSIS — I10 Essential (primary) hypertension: Secondary | ICD-10-CM

## 2017-04-16 DIAGNOSIS — Z23 Encounter for immunization: Secondary | ICD-10-CM | POA: Diagnosis not present

## 2017-04-16 DIAGNOSIS — F411 Generalized anxiety disorder: Secondary | ICD-10-CM

## 2017-04-16 DIAGNOSIS — I4891 Unspecified atrial fibrillation: Secondary | ICD-10-CM | POA: Diagnosis not present

## 2017-04-16 DIAGNOSIS — I482 Chronic atrial fibrillation, unspecified: Secondary | ICD-10-CM

## 2017-04-16 DIAGNOSIS — D509 Iron deficiency anemia, unspecified: Secondary | ICD-10-CM

## 2017-04-16 DIAGNOSIS — R6889 Other general symptoms and signs: Secondary | ICD-10-CM | POA: Diagnosis not present

## 2017-04-16 DIAGNOSIS — R600 Localized edema: Secondary | ICD-10-CM | POA: Diagnosis not present

## 2017-04-16 DIAGNOSIS — G4700 Insomnia, unspecified: Secondary | ICD-10-CM

## 2017-04-16 DIAGNOSIS — I25700 Atherosclerosis of coronary artery bypass graft(s), unspecified, with unstable angina pectoris: Secondary | ICD-10-CM | POA: Diagnosis not present

## 2017-04-16 DIAGNOSIS — E785 Hyperlipidemia, unspecified: Secondary | ICD-10-CM

## 2017-04-16 DIAGNOSIS — I509 Heart failure, unspecified: Secondary | ICD-10-CM | POA: Diagnosis not present

## 2017-04-16 DIAGNOSIS — F332 Major depressive disorder, recurrent severe without psychotic features: Secondary | ICD-10-CM | POA: Diagnosis not present

## 2017-04-16 DIAGNOSIS — E039 Hypothyroidism, unspecified: Secondary | ICD-10-CM

## 2017-04-16 NOTE — Patient Instructions (Signed)
Hypothyroidism Hypothyroidism is a disorder of the thyroid. The thyroid is a large gland that is located in the lower front of the neck. The thyroid releases hormones that control how the body works. With hypothyroidism, the thyroid does not make enough of these hormones. What are the causes? Causes of hypothyroidism may include:  Viral infections.  Pregnancy.  Your own defense system (immune system) attacking your thyroid.  Certain medicines.  Birth defects.  Past radiation treatments to your head or neck.  Past treatment with radioactive iodine.  Past surgical removal of part or all of your thyroid.  Problems with the gland that is located in the center of your brain (pituitary).  What are the signs or symptoms? Signs and symptoms of hypothyroidism may include:  Feeling as though you have no energy (lethargy).  Inability to tolerate cold.  Weight gain that is not explained by a change in diet or exercise habits.  Dry skin.  Coarse hair.  Menstrual irregularity.  Slowing of thought processes.  Constipation.  Sadness or depression.  How is this diagnosed? Your health care provider may diagnose hypothyroidism with blood tests and ultrasound tests. How is this treated? Hypothyroidism is treated with medicine that replaces the hormones that your body does not make. After you begin treatment, it may take several weeks for symptoms to go away. Follow these instructions at home:  Take medicines only as directed by your health care provider.  If you start taking any new medicines, tell your health care provider.  Keep all follow-up visits as directed by your health care provider. This is important. As your condition improves, your dosage needs may change. You will need to have blood tests regularly so that your health care provider can watch your condition. Contact a health care provider if:  Your symptoms do not get better with treatment.  You are taking thyroid  replacement medicine and: ? You sweat excessively. ? You have tremors. ? You feel anxious. ? You lose weight rapidly. ? You cannot tolerate heat. ? You have emotional swings. ? You have diarrhea. ? You feel weak. Get help right away if:  You develop chest pain.  You develop an irregular heartbeat.  You develop a rapid heartbeat. This information is not intended to replace advice given to you by your health care provider. Make sure you discuss any questions you have with your health care provider. Document Released: 06/11/2005 Document Revised: 11/17/2015 Document Reviewed: 10/27/2013 Elsevier Interactive Patient Education  2017 Elsevier Inc.  

## 2017-04-16 NOTE — Addendum Note (Signed)
Addended by: Shelbie Ammons on: 04/16/2017 04:27 PM   Modules accepted: Orders

## 2017-04-16 NOTE — Progress Notes (Signed)
Subjective:    Patient ID: Sharon Compton, female    DOB: 09/16/1940, 76 y.o.   MRN: [id number]  Pt presents to the office today for chronic follow up. Pt is followed by Cardiologists  every 3 months for CHF, A Fib, and CAD.   Pt states she has not not taken any of her thyroid medication.  Hypertension  This is a chronic problem. The current episode started more than 1 year ago. The problem has been resolved since onset. The problem is controlled. Associated symptoms include anxiety, malaise/fatigue, peripheral edema and shortness of breath. Risk factors for coronary artery disease include dyslipidemia, obesity, sedentary lifestyle, post-menopausal state and family history. The current treatment provides moderate improvement. Hypertensive end-organ damage includes CAD/MI and heart failure. There is no history of kidney disease. Identifiable causes of hypertension include a thyroid problem.  Congestive Heart Failure  Presents for follow-up visit. Associated symptoms include fatigue and shortness of breath. The symptoms have been stable.  Thyroid Problem  Presents for follow-up visit. Symptoms include anxiety, depressed mood, dry skin, fatigue, hair loss and leg swelling. The symptoms have been worsening. Her past medical history is significant for heart failure.  Anxiety  Presents for follow-up visit. Symptoms include depressed mood, excessive worry, insomnia, irritability, nervous/anxious behavior, restlessness and shortness of breath. Symptoms occur occasionally. The severity of symptoms is moderate. The quality of sleep is poor.   Her past medical history is significant for anemia.  Depression         This is a chronic problem.  The current episode started more than 1 year ago.   The onset quality is gradual.   The problem occurs intermittently.  The problem has been waxing and waning since onset.  Associated symptoms include fatigue, insomnia, irritable, restlessness and sad.  Past medical  history includes thyroid problem and anxiety.   Insomnia  Primary symptoms: difficulty falling asleep, frequent awakening, malaise/fatigue.  The current episode started more than one year. The onset quality is gradual. The problem has been waxing and waning since onset. PMH includes: depression.  Anemia  Presents for follow-up visit. Symptoms include leg swelling and malaise/fatigue. Past medical history includes heart failure.  Peripheral Edema Pt taking demadex 40 mg daily.     Review of Systems  Constitutional: Positive for fatigue, irritability and malaise/fatigue.  Respiratory: Positive for shortness of breath.   Psychiatric/Behavioral: Positive for depression. The patient is nervous/anxious and has insomnia.   All other systems reviewed and are negative.      Objective:   Physical Exam  Constitutional: She is oriented to person, place, and time. She appears well-developed and well-nourished. She is irritable. No distress.  HENT:  Head: Normocephalic and atraumatic.  Right Ear: External ear normal.  Left Ear: External ear normal.  Nose: Nose normal.  Mouth/Throat: Oropharynx is clear and moist.  Eyes: Pupils are equal, round, and reactive to light.  Neck: Normal range of motion. Neck supple. No thyromegaly present.  Cardiovascular: Normal rate, regular rhythm, normal heart sounds and intact distal pulses.   No murmur heard. Pulmonary/Chest: Effort normal and breath sounds normal. No respiratory distress. She has no wheezes.  Abdominal: Soft. Bowel sounds are normal. She exhibits no distension. There is no tenderness.  Musculoskeletal: Normal range of motion. She exhibits edema (2+ BLE). She exhibits no tenderness.  Neurological: She is alert and oriented to person, place, and time.  Skin: Skin is warm and dry.  Psychiatric: She has a normal mood and affect. Her  behavior is normal. Judgment and thought content normal.  Vitals reviewed.     BP 123/66   Pulse (!) 55    Temp 98.4 F (36.9 C) (Oral)   Ht 5' (1.524 m)   Wt 185 lb (83.9 kg)   BMI 36.13 kg/m      Assessment & Plan:  1. Severe episode of recurrent major depressive disorder, without psychotic features ([place]) - CMP14+EGFR  2. Iron deficiency anemia, unspecified iron deficiency anemia type - CMP14+EGFR - Anemia Profile B  3. Insomnia, unspecified type  - CMP14+EGFR  4. Morbid obesity (HCC) - CMP14+EGFR  5. Hypothyroidism, unspecified type - CMP14+EGFR - TSH  6. Essential hypertension  - CMP14+EGFR  7. Edema of extremities  - CMP14+EGFR  8. Congestive heart failure, unspecified HF chronicity, unspecified heart failure type ([place]) - CMP14+EGFR - Lipid panel  9. Coronary artery disease involving coronary bypass graft of native heart with unstable angina pectoris (HCC)  - CMP14+EGFR - Lipid panel  10. Generalized anxiety disorder  - CMP14+EGFR  11. Atrial fibrillation, unspecified type ([place])  - CMP14+EGFR  12. Chronic atrial fibrillation (HCC) - CMP14+EGFR  13. Hyperlipidemia, unspecified hyperlipidemia type  - CMP14+EGFR - Lipid panel   Continue all meds Labs pending Health Maintenance reviewed Diet and exercise encouraged RTO 2 months   Evelina Dun, FNP

## 2017-04-17 ENCOUNTER — Other Ambulatory Visit: Payer: Self-pay | Admitting: Family

## 2017-04-17 DIAGNOSIS — D509 Iron deficiency anemia, unspecified: Secondary | ICD-10-CM

## 2017-04-17 LAB — CMP14+EGFR
ALT: 32 IU/L (ref 0–32)
AST: 33 IU/L (ref 0–40)
Albumin/Globulin Ratio: 1.5 (ref 1.2–2.2)
Albumin: 3.9 g/dL (ref 3.5–4.8)
Alkaline Phosphatase: 75 IU/L (ref 39–117)
BUN/Creatinine Ratio: 16 (ref 12–28)
BUN: 18 mg/dL (ref 8–27)
Bilirubin Total: 0.3 mg/dL (ref 0.0–1.2)
CO2: 26 mmol/L (ref 20–29)
Calcium: 8.7 mg/dL (ref 8.7–10.3)
Chloride: 101 mmol/L (ref 96–106)
Creatinine, Ser: 1.16 mg/dL — ABNORMAL HIGH (ref 0.57–1.00)
GFR calc Af Amer: 53 mL/min/{1.73_m2} — ABNORMAL LOW (ref >59)
GFR calc non Af Amer: 46 mL/min/{1.73_m2} — ABNORMAL LOW (ref >59)
Globulin, Total: 2.6 g/dL (ref 1.5–4.5)
Glucose: 125 mg/dL — ABNORMAL HIGH (ref 65–99)
Potassium: 3.9 mmol/L (ref 3.5–5.2)
Sodium: 142 mmol/L (ref 134–144)
Total Protein: 6.5 g/dL (ref 6.0–8.5)

## 2017-04-17 LAB — LIPID PANEL
Chol/HDL Ratio: 4.2 ratio (ref 0.0–4.4)
Cholesterol, Total: 186 mg/dL (ref 100–199)
HDL: 44 mg/dL (ref >39)
LDL Calculated: 116 mg/dL — ABNORMAL HIGH (ref 0–99)
Triglycerides: 129 mg/dL (ref 0–149)
VLDL Cholesterol Cal: 26 mg/dL (ref 5–40)

## 2017-04-17 LAB — ANEMIA PROFILE B
BASOS: 2 %
Basophils Absolute: 0.1 10*3/uL (ref 0.0–0.2)
EOS (ABSOLUTE): 0.1 10*3/uL (ref 0.0–0.4)
EOS: 1 %
FERRITIN: 11 ng/mL — AB (ref 15–150)
Folate: 7.7 ng/mL (ref >3.0)
HEMATOCRIT: 35.7 % (ref 34.0–46.6)
HEMOGLOBIN: 10.3 g/dL — AB (ref 11.1–15.9)
IMMATURE GRANS (ABS): 0 10*3/uL (ref 0.0–0.1)
IRON SATURATION: 8 % — AB (ref 15–55)
IRON: 32 ug/dL (ref 27–139)
Immature Granulocytes: 0 %
Lymphocytes Absolute: 1.5 10*3/uL (ref 0.7–3.1)
Lymphs: 33 %
MCH: 22.1 pg — ABNORMAL LOW (ref 26.6–33.0)
MCHC: 28.9 g/dL — AB (ref 31.5–35.7)
MCV: 77 fL — ABNORMAL LOW (ref 79–97)
MONOCYTES: 7 %
MONOS ABS: 0.3 10*3/uL (ref 0.1–0.9)
NEUTROS PCT: 57 %
Neutrophils Absolute: 2.6 10*3/uL (ref 1.4–7.0)
Platelets: 179 10*3/uL (ref 150–379)
RBC: 4.66 x10E6/uL (ref 3.77–5.28)
RDW: 18.6 % — ABNORMAL HIGH (ref 12.3–15.4)
RETIC CT PCT: 1.7 % (ref 0.6–2.6)
TIBC: 388 ug/dL (ref 250–450)
UIBC: 356 ug/dL (ref 118–369)
Vitamin B-12: 657 pg/mL (ref 232–1245)
WBC: 4.5 10*3/uL (ref 3.4–10.8)

## 2017-04-17 LAB — TSH: TSH: 8.86 u[IU]/mL — ABNORMAL HIGH (ref 0.450–4.500)

## 2017-04-18 ENCOUNTER — Encounter (HOSPITAL_COMMUNITY): Payer: Medicare Other | Attending: Oncology | Admitting: Oncology

## 2017-04-18 ENCOUNTER — Encounter (HOSPITAL_COMMUNITY): Payer: Self-pay | Admitting: Oncology

## 2017-04-18 VITALS — BP 212/64 | HR 54 | Temp 98.0°F | Resp 18 | Wt 184.7 lb

## 2017-04-18 DIAGNOSIS — D508 Other iron deficiency anemias: Secondary | ICD-10-CM

## 2017-04-18 DIAGNOSIS — I10 Essential (primary) hypertension: Secondary | ICD-10-CM | POA: Diagnosis not present
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
## 2017-04-18 NOTE — Patient Instructions (Signed)
[facility] Discharge Instructions  RECOMMENDATIONS MADE BY THE CONSULTANT AND ANY TEST RESULTS WILL BE SENT TO YOUR REFERRING PHYSICIAN.  You were seen today by Dr. Louise Zhou    Thank you for choosing [facility] at [facility] to provide your oncology and hematology care.  To afford each patient quality time with our provider, please arrive at least 15 minutes before your scheduled appointment time.    If you have a lab appointment with the Cancer Center please come in thru the  Main Entrance and check in at the main information desk  You need to re-schedule your appointment should you arrive 10 or more minutes late.  We strive to give you quality time with our providers, and arriving late affects you and other patients whose appointments are after yours.  Also, if you no show three or more times for appointments you may be dismissed from the clinic at the providers discretion.     Again, thank you for choosing [facility].  Our hope is that these requests will decrease the amount of time that you wait before being seen by our physicians.       _____________________________________________________________  Should you have questions after your visit to [facility], please contact our office at [phone] between the hours of 8:30 a.m. and 4:30 p.m.  Voicemails left after 4:30 p.m. will not be returned until the following business day.  For prescription refill requests, have your pharmacy contact our office.       Resources For Cancer Patients and their Caregivers ? American Cancer Society: Can assist with transportation, wigs, general needs, runs Look Good Feel Better.        1-888-227-6333 ? Cancer Care: Provides financial assistance, online support groups, medication/co-pay assistance.  1-800-813-HOPE (4673) ? [facility] Assists [place] cancer patients and their  families through emotional , educational and financial support.  336-427-4357 ? [place] DSS Where to apply for food stamps, Medicaid and utility assistance. 336-342-1394 ? [facility]: Transportation to medical appointments. 336-347-2287 ? Social Security Administration: May apply for disability if have a Stage IV cancer. 336-342-7796 1-800-772-1213 ? [place] Aging, Disability and Transit Services: Assists with nutrition, care and transit needs. 336-349-2343  Cancer Center Support Programs: @[email or web address]@ > Cancer Support Group  2nd Tuesday of the month 1pm-2pm, Journey Room  > Creative Journey  3rd Tuesday of the month 1130am-1pm, Journey Room  > Look Good Feel Better  1st Wednesday of the month 10am-12 noon, Journey Room (Call American Cancer Society to register 1-800-395-5775)    

## 2017-04-22 ENCOUNTER — Ambulatory Visit (HOSPITAL_COMMUNITY): Payer: Medicare Other

## 2017-04-29 ENCOUNTER — Encounter (HOSPITAL_COMMUNITY): Payer: Medicare Other | Attending: Oncology

## 2017-04-29 ENCOUNTER — Encounter (HOSPITAL_COMMUNITY): Payer: Self-pay

## 2017-04-29 VITALS — BP 156/57 | HR 50 | Temp 98.0°F | Resp 18

## 2017-04-29 DIAGNOSIS — D508 Other iron deficiency anemias: Secondary | ICD-10-CM

## 2017-04-29 MED ORDER — SODIUM CHLORIDE 0.9 % IV SOLN
Freq: Once | INTRAVENOUS | Status: AC
  Administered 2017-04-29: 14:00:00 via INTRAVENOUS

## 2017-04-29 MED ORDER — FERUMOXYTOL INJECTION 510 MG/17 ML
510.0000 mg | Freq: Once | INTRAVENOUS | Status: AC
  Administered 2017-04-29: 510 mg via INTRAVENOUS
  Filled 2017-04-29: qty 17

## 2017-04-29 MED ORDER — SODIUM CHLORIDE 0.9% FLUSH
10.0000 mL | INTRAVENOUS | Status: DC | PRN
  Administered 2017-04-29: 10 mL
  Filled 2017-04-29: qty 10

## 2017-04-29 NOTE — Patient Instructions (Signed)
[facility] at [facility]  Discharge Instructions:  Ferumoxytol injection What is this medicine? FERUMOXYTOL is an iron complex. Iron is used to make healthy red blood cells, which carry oxygen and nutrients throughout the body. This medicine is used to treat iron deficiency anemia in people with chronic kidney disease. This medicine may be used for other purposes; ask your health care provider or pharmacist if you have questions. COMMON BRAND NAME(S): Feraheme What should I tell my health care provider before I take this medicine? They need to know if you have any of these conditions: -anemia not caused by low iron levels -high levels of iron in the blood -magnetic resonance imaging (MRI) test scheduled -an unusual or allergic reaction to iron, other medicines, foods, dyes, or preservatives -pregnant or trying to get pregnant -breast-feeding How should I use this medicine? This medicine is for injection into a vein. It is given by a health care professional in a hospital or clinic setting. Talk to your pediatrician regarding the use of this medicine in children. Special care may be needed. Overdosage: If you think you have taken too much of this medicine contact a poison control center or emergency room at once. NOTE: This medicine is only for you. Do not share this medicine with others. What if I miss a dose? It is important not to miss your dose. Call your doctor or health care professional if you are unable to keep an appointment. What may interact with this medicine? This medicine may interact with the following medications: -other iron products This list may not describe all possible interactions. Give your health care provider a list of all the medicines, herbs, non-prescription drugs, or dietary supplements you use. Also tell them if you smoke, drink alcohol, or use illegal drugs. Some items may interact with your medicine. What should I watch for while  using this medicine? Visit your doctor or healthcare professional regularly. Tell your doctor or healthcare professional if your symptoms do not start to get better or if they get worse. You may need blood work done while you are taking this medicine. You may need to follow a special diet. Talk to your doctor. Foods that contain iron include: whole grains/cereals, dried fruits, beans, or peas, leafy green vegetables, and organ meats (liver, kidney). What side effects may I notice from receiving this medicine? Side effects that you should report to your doctor or health care professional as soon as possible: -allergic reactions like skin rash, itching or hives, swelling of the face, lips, or tongue -breathing problems -changes in blood pressure -feeling faint or lightheaded, falls -fever or chills -flushing, sweating, or hot feelings -swelling of the ankles or feet Side effects that usually do not require medical attention (report to your doctor or health care professional if they continue or are bothersome): -diarrhea -headache -nausea, vomiting -stomach pain This list may not describe all possible side effects. Call your doctor for medical advice about side effects. You may report side effects to FDA at 1-800-FDA-1088. Where should I keep my medicine? This drug is given in a hospital or clinic and will not be stored at home. NOTE: This sheet is a summary. It may not cover all possible information. If you have questions about this medicine, talk to your doctor, pharmacist, or health care provider.  2018 Elsevier/Gold Standard (2015-07-14 12:41:49)  _______________________________________________________________  Thank you for choosing  [facility] to provide your oncology and hematology care.  To   afford each patient quality time with our providers, please arrive at least 15 minutes before your scheduled appointment.  You need to re-schedule your  appointment if you arrive 10 or more minutes late.  We strive to give you quality time with our providers, and arriving late affects you and other patients whose appointments are after yours.  Also, if you no show three or more times for appointments you may be dismissed from the clinic.  Again, thank you for choosing [facility] at [facility]. Our hope is that these requests will allow you access to exceptional care and in a timely manner. _______________________________________________________________  If you have questions after your visit, please contact our office at [phone] between the hours of 8:30 a.m. and 5:00 p.m. Voicemails left after 4:30 p.m. will not be returned until the following business day. _______________________________________________________________  For prescription refill requests, have your pharmacy contact our office. _______________________________________________________________  Recommendations made by the consultant and any test results will be sent to your referring physician. _______________________________________________________________ 

## 2017-04-29 NOTE — Progress Notes (Signed)
Patient to treatment area for iron infusion.  Reviewed side effects of iron with understanding verbalized.  All questions asked and answered.  Information in patients AVS for home review and when to call the [facility].   Patient tolerated iron infusion with no complaints voiced.  Good blood return noted before and after administration of iron.  Peripheral IV site clean and dry with no bruising or swelling noted at site.  Band aid applied.  VSS with discharge and left ambulatory with family member.  No s/s of distress noted.

## 2017-04-30 ENCOUNTER — Ambulatory Visit: Payer: Medicare Other | Admitting: Family

## 2017-05-06 ENCOUNTER — Encounter (HOSPITAL_COMMUNITY): Payer: Self-pay

## 2017-05-06 ENCOUNTER — Encounter (HOSPITAL_BASED_OUTPATIENT_CLINIC_OR_DEPARTMENT_OTHER): Payer: Medicare Other

## 2017-05-06 VITALS — BP 181/61 | HR 55 | Temp 98.1°F | Resp 18

## 2017-05-06 DIAGNOSIS — D508 Other iron deficiency anemias: Secondary | ICD-10-CM | POA: Diagnosis present

## 2017-05-06 MED ORDER — SODIUM CHLORIDE 0.9% FLUSH
10.0000 mL | INTRAVENOUS | Status: DC | PRN
  Administered 2017-05-06: 10 mL
  Filled 2017-05-06: qty 10

## 2017-05-06 MED ORDER — SODIUM CHLORIDE 0.9 % IV SOLN
Freq: Once | INTRAVENOUS | Status: AC
  Administered 2017-05-06: 14:00:00 via INTRAVENOUS

## 2017-05-06 MED ORDER — SODIUM CHLORIDE 0.9 % IV SOLN
510.0000 mg | Freq: Once | INTRAVENOUS | Status: AC
  Administered 2017-05-06: 510 mg via INTRAVENOUS
  Filled 2017-05-06: qty 17

## 2017-05-06 NOTE — Patient Instructions (Signed)
[facility] at [facility]  Discharge Instructions:  You received an iron infusion today.  _______________________________________________________________  Thank you for choosing [facility] at [facility] to provide your oncology and hematology care.  To afford each patient quality time with our providers, please arrive at least 15 minutes before your scheduled appointment.  You need to re-schedule your appointment if you arrive 10 or more minutes late.  We strive to give you quality time with our providers, and arriving late affects you and other patients whose appointments are after yours.  Also, if you no show three or more times for appointments you may be dismissed from the clinic.  Again, thank you for choosing [facility]. Our hope is that these requests will allow you access to exceptional care and in a timely manner. _______________________________________________________________  If you have questions after your visit, please contact our office at [phone] between the hours of 8:30 a.m. and 5:00 p.m. Voicemails left after 4:30 p.m. will not be returned until the following business day. _______________________________________________________________  For prescription refill requests, have your pharmacy contact our office. _______________________________________________________________  Recommendations made by the consultant and any test results will be sent to your referring physician. _______________________________________________________________ 

## 2017-05-06 NOTE — Progress Notes (Signed)
To treatment room for iron infusion.  Patient stated no improvement with fatigue but otherwise no complaints voiced.  Good blood return noted with peripheral IV and resting with eyes closed.   Patient tolerated iron infusion with no complaints voiced.  Good blood return noted before and after administration of iron.  Peripheral IV site clean and dry with no bruising or swelling noted at site.  Band aid applied.  VSS with discharge and left ambulatory with family.  No s/s of distress noted.

## 2017-05-09 ENCOUNTER — Other Ambulatory Visit (HOSPITAL_COMMUNITY): Payer: Self-pay | Admitting: *Deleted

## 2017-05-09 DIAGNOSIS — D508 Other iron deficiency anemias: Secondary | ICD-10-CM

## 2017-06-12 ENCOUNTER — Ambulatory Visit (HOSPITAL_COMMUNITY): Payer: Medicare Other

## 2017-06-12 ENCOUNTER — Other Ambulatory Visit (HOSPITAL_COMMUNITY): Payer: Medicare Other

## 2017-06-13 ENCOUNTER — Ambulatory Visit (INDEPENDENT_AMBULATORY_CARE_PROVIDER_SITE_OTHER): Payer: Medicare Other | Admitting: Family

## 2017-06-13 ENCOUNTER — Encounter: Payer: Self-pay | Admitting: Family

## 2017-06-13 VITALS — BP 174/91 | HR 49 | Temp 96.9°F | Ht 60.0 in | Wt 184.4 lb

## 2017-06-13 DIAGNOSIS — F411 Generalized anxiety disorder: Secondary | ICD-10-CM

## 2017-06-13 DIAGNOSIS — F332 Major depressive disorder, recurrent severe without psychotic features: Secondary | ICD-10-CM

## 2017-06-13 DIAGNOSIS — F331 Major depressive disorder, recurrent, moderate: Secondary | ICD-10-CM

## 2017-06-13 DIAGNOSIS — I4891 Unspecified atrial fibrillation: Secondary | ICD-10-CM | POA: Diagnosis not present

## 2017-06-13 DIAGNOSIS — I10 Essential (primary) hypertension: Secondary | ICD-10-CM | POA: Diagnosis not present

## 2017-06-13 DIAGNOSIS — I209 Angina pectoris, unspecified: Secondary | ICD-10-CM | POA: Diagnosis not present

## 2017-06-13 DIAGNOSIS — I2510 Atherosclerotic heart disease of native coronary artery without angina pectoris: Secondary | ICD-10-CM

## 2017-06-13 DIAGNOSIS — D508 Other iron deficiency anemias: Secondary | ICD-10-CM

## 2017-06-13 DIAGNOSIS — E039 Hypothyroidism, unspecified: Secondary | ICD-10-CM

## 2017-06-13 DIAGNOSIS — I509 Heart failure, unspecified: Secondary | ICD-10-CM | POA: Diagnosis not present

## 2017-06-13 MED ORDER — DULOXETINE HCL 30 MG PO CPEP: 30.0000 mg | ORAL_CAPSULE | Freq: Every day | ORAL | 3 refills | Status: DC

## 2017-06-13 NOTE — Patient Instructions (Signed)
Hypothyroidism Hypothyroidism is a disorder of the thyroid. The thyroid is a large gland that is located in the lower front of the neck. The thyroid releases hormones that control how the body works. With hypothyroidism, the thyroid does not make enough of these hormones. What are the causes? Causes of hypothyroidism may include:  Viral infections.  Pregnancy.  Your own defense system (immune system) attacking your thyroid.  Certain medicines.  Birth defects.  Past radiation treatments to your head or neck.  Past treatment with radioactive iodine.  Past surgical removal of part or all of your thyroid.  Problems with the gland that is located in the center of your brain (pituitary).  What are the signs or symptoms? Signs and symptoms of hypothyroidism may include:  Feeling as though you have no energy (lethargy).  Inability to tolerate cold.  Weight gain that is not explained by a change in diet or exercise habits.  Dry skin.  Coarse hair.  Menstrual irregularity.  Slowing of thought processes.  Constipation.  Sadness or depression.  How is this diagnosed? Your health care provider may diagnose hypothyroidism with blood tests and ultrasound tests. How is this treated? Hypothyroidism is treated with medicine that replaces the hormones that your body does not make. After you begin treatment, it may take several weeks for symptoms to go away. Follow these instructions at home:  Take medicines only as directed by your health care provider.  If you start taking any new medicines, tell your health care provider.  Keep all follow-up visits as directed by your health care provider. This is important. As your condition improves, your dosage needs may change. You will need to have blood tests regularly so that your health care provider can watch your condition. Contact a health care provider if:  Your symptoms do not get better with treatment.  You are taking thyroid  replacement medicine and: ? You sweat excessively. ? You have tremors. ? You feel anxious. ? You lose weight rapidly. ? You cannot tolerate heat. ? You have emotional swings. ? You have diarrhea. ? You feel weak. Get help right away if:  You develop chest pain.  You develop an irregular heartbeat.  You develop a rapid heartbeat. This information is not intended to replace advice given to you by your health care provider. Make sure you discuss any questions you have with your health care provider. Document Released: 06/11/2005 Document Revised: 11/17/2015 Document Reviewed: 10/27/2013 Elsevier Interactive Patient Education  2018 Elsevier Inc.  
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
## 2017-06-14 ENCOUNTER — Other Ambulatory Visit: Payer: Self-pay | Admitting: Family

## 2017-06-14 LAB — CMP14+EGFR
A/G RATIO: 1.7 (ref 1.2–2.2)
ALBUMIN: 4 g/dL (ref 3.5–4.8)
ALT: 39 IU/L — ABNORMAL HIGH (ref 0–32)
AST: 38 IU/L (ref 0–40)
Alkaline Phosphatase: 100 IU/L (ref 39–117)
BUN/Creatinine Ratio: 13 (ref 12–28)
BUN: 17 mg/dL (ref 8–27)
Bilirubin Total: 0.3 mg/dL (ref 0.0–1.2)
CALCIUM: 8.8 mg/dL (ref 8.7–10.3)
CO2: 28 mmol/L (ref 20–29)
CREATININE: 1.33 mg/dL — AB (ref 0.57–1.00)
Chloride: 103 mmol/L (ref 96–106)
GFR, EST AFRICAN AMERICAN: 45 mL/min/{1.73_m2} — AB (ref >59)
GFR, EST NON AFRICAN AMERICAN: 39 mL/min/{1.73_m2} — AB (ref >59)
GLOBULIN, TOTAL: 2.3 g/dL (ref 1.5–4.5)
Glucose: 91 mg/dL (ref 65–99)
POTASSIUM: 3.5 mmol/L (ref 3.5–5.2)
SODIUM: 145 mmol/L — AB (ref 134–144)
TOTAL PROTEIN: 6.3 g/dL (ref 6.0–8.5)

## 2017-06-14 LAB — LIPID PANEL
CHOL/HDL RATIO: 4.5 ratio — AB (ref 0.0–4.4)
Cholesterol, Total: 196 mg/dL (ref 100–199)
HDL: 44 mg/dL (ref >39)
LDL CALC: 126 mg/dL — AB (ref 0–99)
Triglycerides: 130 mg/dL (ref 0–149)
VLDL Cholesterol Cal: 26 mg/dL (ref 5–40)

## 2017-06-14 LAB — TSH: TSH: 10.63 u[IU]/mL — AB (ref 0.450–4.500)

## 2017-06-14 MED ORDER — ONDANSETRON 4 MG PO TBDP: 4.0000 mg | ORAL_TABLET | Freq: Three times a day (TID) | ORAL | 0 refills | Status: DC | PRN

## 2017-06-20 ENCOUNTER — Ambulatory Visit: Payer: Medicare Other | Admitting: Cardiovascular Disease

## 2017-06-20 ENCOUNTER — Encounter: Payer: Self-pay | Admitting: *Deleted

## 2017-06-21 ENCOUNTER — Ambulatory Visit: Payer: Medicare Other | Admitting: Family

## 2017-07-08 ENCOUNTER — Inpatient Hospital Stay (HOSPITAL_COMMUNITY): Payer: Medicare Other | Attending: Oncology | Admitting: Oncology

## 2017-07-08 ENCOUNTER — Inpatient Hospital Stay (HOSPITAL_COMMUNITY): Payer: Medicare Other

## 2017-07-08 ENCOUNTER — Other Ambulatory Visit: Payer: Self-pay

## 2017-07-08 ENCOUNTER — Encounter (HOSPITAL_COMMUNITY): Payer: Self-pay | Admitting: Oncology

## 2017-07-08 VITALS — BP 186/71 | HR 48 | Temp 98.0°F | Resp 18 | Ht 60.0 in | Wt 181.0 lb

## 2017-07-08 DIAGNOSIS — J449 Chronic obstructive pulmonary disease, unspecified: Secondary | ICD-10-CM | POA: Insufficient documentation

## 2017-07-08 DIAGNOSIS — Z955 Presence of coronary angioplasty implant and graft: Secondary | ICD-10-CM | POA: Diagnosis not present

## 2017-07-08 DIAGNOSIS — R5381 Other malaise: Secondary | ICD-10-CM | POA: Diagnosis not present

## 2017-07-08 DIAGNOSIS — M797 Fibromyalgia: Secondary | ICD-10-CM | POA: Insufficient documentation

## 2017-07-08 DIAGNOSIS — I252 Old myocardial infarction: Secondary | ICD-10-CM | POA: Diagnosis not present

## 2017-07-08 DIAGNOSIS — M7989 Other specified soft tissue disorders: Secondary | ICD-10-CM | POA: Diagnosis not present

## 2017-07-08 DIAGNOSIS — R410 Disorientation, unspecified: Secondary | ICD-10-CM | POA: Insufficient documentation

## 2017-07-08 DIAGNOSIS — M25561 Pain in right knee: Secondary | ICD-10-CM | POA: Diagnosis not present

## 2017-07-08 DIAGNOSIS — I2510 Atherosclerotic heart disease of native coronary artery without angina pectoris: Secondary | ICD-10-CM | POA: Diagnosis not present

## 2017-07-08 DIAGNOSIS — I10 Essential (primary) hypertension: Secondary | ICD-10-CM

## 2017-07-08 DIAGNOSIS — Z79899 Other long term (current) drug therapy: Secondary | ICD-10-CM | POA: Insufficient documentation

## 2017-07-08 DIAGNOSIS — D508 Other iron deficiency anemias: Secondary | ICD-10-CM

## 2017-07-08 DIAGNOSIS — D509 Iron deficiency anemia, unspecified: Secondary | ICD-10-CM | POA: Diagnosis not present

## 2017-07-08 DIAGNOSIS — R112 Nausea with vomiting, unspecified: Secondary | ICD-10-CM | POA: Insufficient documentation

## 2017-07-08 DIAGNOSIS — F418 Other specified anxiety disorders: Secondary | ICD-10-CM | POA: Diagnosis not present

## 2017-07-08 DIAGNOSIS — K219 Gastro-esophageal reflux disease without esophagitis: Secondary | ICD-10-CM | POA: Insufficient documentation

## 2017-07-08 DIAGNOSIS — Z87891 Personal history of nicotine dependence: Secondary | ICD-10-CM | POA: Insufficient documentation

## 2017-07-08 DIAGNOSIS — R42 Dizziness and giddiness: Secondary | ICD-10-CM | POA: Insufficient documentation

## 2017-07-08 DIAGNOSIS — Z7982 Long term (current) use of aspirin: Secondary | ICD-10-CM | POA: Insufficient documentation

## 2017-07-08 DIAGNOSIS — R531 Weakness: Secondary | ICD-10-CM | POA: Diagnosis not present

## 2017-07-08 DIAGNOSIS — R232 Flushing: Secondary | ICD-10-CM | POA: Diagnosis not present

## 2017-07-08 DIAGNOSIS — R6883 Chills (without fever): Secondary | ICD-10-CM | POA: Insufficient documentation

## 2017-07-08 DIAGNOSIS — E876 Hypokalemia: Secondary | ICD-10-CM | POA: Diagnosis not present

## 2017-07-08 DIAGNOSIS — I4891 Unspecified atrial fibrillation: Secondary | ICD-10-CM | POA: Diagnosis not present

## 2017-07-08 DIAGNOSIS — Z885 Allergy status to narcotic agent status: Secondary | ICD-10-CM | POA: Insufficient documentation

## 2017-07-08 DIAGNOSIS — R634 Abnormal weight loss: Secondary | ICD-10-CM | POA: Diagnosis not present

## 2017-07-08 DIAGNOSIS — R946 Abnormal results of thyroid function studies: Secondary | ICD-10-CM | POA: Insufficient documentation

## 2017-07-08 LAB — CBC WITH DIFFERENTIAL/PLATELET
BASOS ABS: 0.1 10*3/uL (ref 0.0–0.1)
Basophils Relative: 1 %
EOS PCT: 1 %
Eosinophils Absolute: 0 10*3/uL (ref 0.0–0.7)
HEMATOCRIT: 44.5 % (ref 36.0–46.0)
Hemoglobin: 14.4 g/dL (ref 12.0–15.0)
LYMPHS ABS: 2 10*3/uL (ref 0.7–4.0)
LYMPHS PCT: 41 %
MCH: 28 pg (ref 26.0–34.0)
MCHC: 32.4 g/dL (ref 30.0–36.0)
MCV: 86.4 fL (ref 78.0–100.0)
MONO ABS: 0.3 10*3/uL (ref 0.1–1.0)
MONOS PCT: 6 %
NEUTROS ABS: 2.5 10*3/uL (ref 1.7–7.7)
Neutrophils Relative %: 51 %
PLATELETS: 163 10*3/uL (ref 150–400)
RBC: 5.15 MIL/uL — ABNORMAL HIGH (ref 3.87–5.11)
RDW: 18.8 % — AB (ref 11.5–15.5)
WBC: 5 10*3/uL (ref 4.0–10.5)

## 2017-07-08 LAB — COMPREHENSIVE METABOLIC PANEL
ALT: 37 U/L (ref 14–54)
AST: 41 U/L (ref 15–41)
Albumin: 3.8 g/dL (ref 3.5–5.0)
Alkaline Phosphatase: 87 U/L (ref 38–126)
Anion gap: 11 (ref 5–15)
BILIRUBIN TOTAL: 0.7 mg/dL (ref 0.3–1.2)
BUN: 16 mg/dL (ref 6–20)
CHLORIDE: 99 mmol/L — AB (ref 101–111)
CO2: 28 mmol/L (ref 22–32)
Calcium: 9 mg/dL (ref 8.9–10.3)
Creatinine, Ser: 1.2 mg/dL — ABNORMAL HIGH (ref 0.44–1.00)
GFR calc Af Amer: 50 mL/min — ABNORMAL LOW (ref >60)
GFR, EST NON AFRICAN AMERICAN: 43 mL/min — AB (ref >60)
Glucose, Bld: 121 mg/dL — ABNORMAL HIGH (ref 65–99)
POTASSIUM: 3.4 mmol/L — AB (ref 3.5–5.1)
Sodium: 138 mmol/L (ref 135–145)
TOTAL PROTEIN: 7.4 g/dL (ref 6.5–8.1)

## 2017-07-08 LAB — IRON AND TIBC
Iron: 105 ug/dL (ref 28–170)
Saturation Ratios: 34 % — ABNORMAL HIGH (ref 10.4–31.8)
TIBC: 309 ug/dL (ref 250–450)
UIBC: 204 ug/dL

## 2017-07-08 LAB — FERRITIN: Ferritin: 141 ng/mL (ref 11–307)

## 2017-07-08 LAB — TSH: TSH: 10.129 u[IU]/mL — AB (ref 0.350–4.500)
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
## 2017-07-09 ENCOUNTER — Telehealth (HOSPITAL_COMMUNITY): Payer: Self-pay | Admitting: *Deleted

## 2017-07-09 NOTE — Telephone Encounter (Signed)
-----   Message from Jacquelin Hawking, NP sent at 07/09/2017  8:44 AM EST ----- Can you call her and let her know her iron levels were perfect! She does not need iron right now. She is to return in 3 months. Also, we sent a message to her PCP Osvaldo Angst about her TSH level and they should be giving her a call about a referral to endocrinology. Thanks.  Sonia Baller

## 2017-07-09 NOTE — Telephone Encounter (Signed)
Notified patient and she verbalizes understanding.  

## 2017-07-19 ENCOUNTER — Other Ambulatory Visit: Payer: Self-pay | Admitting: Family Medicine

## 2017-07-23 ENCOUNTER — Other Ambulatory Visit: Payer: Self-pay

## 2017-07-23 ENCOUNTER — Encounter: Payer: Self-pay | Admitting: Cardiovascular Disease

## 2017-07-23 ENCOUNTER — Ambulatory Visit (INDEPENDENT_AMBULATORY_CARE_PROVIDER_SITE_OTHER): Payer: Medicare Other | Admitting: Cardiovascular Disease

## 2017-07-23 VITALS — BP 210/83 | HR 55 | Ht 60.0 in | Wt 185.0 lb

## 2017-07-23 DIAGNOSIS — R079 Chest pain, unspecified: Secondary | ICD-10-CM

## 2017-07-23 DIAGNOSIS — I5032 Chronic diastolic (congestive) heart failure: Secondary | ICD-10-CM

## 2017-07-23 DIAGNOSIS — I4891 Unspecified atrial fibrillation: Secondary | ICD-10-CM

## 2017-07-23 DIAGNOSIS — Z955 Presence of coronary angioplasty implant and graft: Secondary | ICD-10-CM | POA: Diagnosis not present

## 2017-07-23 DIAGNOSIS — I10 Essential (primary) hypertension: Secondary | ICD-10-CM | POA: Diagnosis not present

## 2017-07-23 DIAGNOSIS — I25118 Atherosclerotic heart disease of native coronary artery with other forms of angina pectoris: Secondary | ICD-10-CM | POA: Diagnosis not present

## 2017-07-23 DIAGNOSIS — N644 Mastodynia: Secondary | ICD-10-CM

## 2017-07-23 DIAGNOSIS — I209 Angina pectoris, unspecified: Secondary | ICD-10-CM

## 2017-07-23 MED ORDER — LOSARTAN POTASSIUM 50 MG PO TABS: 50.0000 mg | ORAL_TABLET | Freq: Every day | ORAL | 1 refills | Status: DC

## 2017-07-23 NOTE — Patient Instructions (Signed)
Your physician recommends that you schedule a follow-up appointment in: [place] physician has recommended you make the following change in your medication:   INCREASE LOSARTAN 50 MG DAILY  WE WILL GET YOU SCHEDULED FOR A MAMMOGRAM   Thank you for choosing [facility]!!
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
## 2017-07-24 ENCOUNTER — Other Ambulatory Visit: Payer: Self-pay | Admitting: Family Medicine

## 2017-07-24 NOTE — Addendum Note (Signed)
Addended by: Julian Hy T on: 07/24/2017 12:20 PM   Modules accepted: Orders

## 2017-07-25 ENCOUNTER — Ambulatory Visit (INDEPENDENT_AMBULATORY_CARE_PROVIDER_SITE_OTHER): Payer: Medicare Other | Admitting: Family

## 2017-07-25 ENCOUNTER — Encounter: Payer: Self-pay | Admitting: Family

## 2017-07-25 ENCOUNTER — Other Ambulatory Visit: Payer: Self-pay | Admitting: Cardiovascular Disease

## 2017-07-25 VITALS — BP 174/72 | HR 51 | Temp 97.3°F | Ht 60.0 in | Wt 185.0 lb

## 2017-07-25 DIAGNOSIS — I25118 Atherosclerotic heart disease of native coronary artery with other forms of angina pectoris: Secondary | ICD-10-CM

## 2017-07-25 DIAGNOSIS — E039 Hypothyroidism, unspecified: Secondary | ICD-10-CM | POA: Diagnosis not present

## 2017-07-25 DIAGNOSIS — F411 Generalized anxiety disorder: Secondary | ICD-10-CM | POA: Diagnosis not present

## 2017-07-25 DIAGNOSIS — R928 Other abnormal and inconclusive findings on diagnostic imaging of breast: Secondary | ICD-10-CM

## 2017-07-25 DIAGNOSIS — I10 Essential (primary) hypertension: Secondary | ICD-10-CM | POA: Diagnosis not present

## 2017-07-25 MED ORDER — ALPRAZOLAM 1 MG PO TABS: 1.0000 mg | ORAL_TABLET | Freq: Three times a day (TID) | ORAL | 5 refills | Status: DC

## 2017-07-25 NOTE — Progress Notes (Signed)
Subjective:    Patient ID: Sharon Compton, female    DOB: 05/25/1941, 77 y.o.   MRN: [id number]  Pt presents to the office today to recheck thyroid levels and anxiety. Pt was started Cymbalta 30 mg, but states she took one tablet and made her feel like "I was climbing the wall".  Hypertension  This is a chronic problem. The current episode started more than 1 year ago. The problem has been waxing and waning since onset. The problem is uncontrolled. Associated symptoms include anxiety, malaise/fatigue and peripheral edema. Pertinent negatives include no shortness of breath. Risk factors for coronary artery disease include diabetes mellitus, dyslipidemia, obesity and sedentary lifestyle. The current treatment provides mild improvement. Identifiable causes of hypertension include a thyroid problem.  Thyroid Problem  Presents for follow-up visit. Symptoms include anxiety, depressed mood, fatigue and hoarse voice. The symptoms have been stable.  Anxiety  Presents for follow-up visit. Symptoms include depressed mood, excessive worry, irritability and nervous/anxious behavior. Patient reports no shortness of breath. Symptoms occur occasionally. The severity of symptoms is moderate. The quality of sleep is good.        Review of Systems  Constitutional: Positive for fatigue, irritability and malaise/fatigue.  HENT: Positive for hoarse voice.   Respiratory: Negative for shortness of breath.   Psychiatric/Behavioral: The patient is nervous/anxious.   All other systems reviewed and are negative.      Objective:   Physical Exam  Constitutional: She is oriented to person, place, and time. She appears well-developed and well-nourished. No distress.  HENT:  Head: Normocephalic and atraumatic.  Right Ear: External ear normal.  Left Ear: External ear normal.  Nose: Nose normal.  Mouth/Throat: Oropharynx is clear and moist.  Eyes: Pupils are equal, round, and reactive to light.  Neck: Normal  range of motion. Neck supple. No thyromegaly present.  Cardiovascular: Normal rate, regular rhythm, normal heart sounds and intact distal pulses.  No murmur heard. Pulmonary/Chest: Effort normal and breath sounds normal. No respiratory distress. She has no wheezes.  Abdominal: Soft. Bowel sounds are normal. She exhibits no distension. There is no tenderness.  Musculoskeletal: Normal range of motion. She exhibits edema (2+ BLE). She exhibits no tenderness.  Neurological: She is alert and oriented to person, place, and time.  Skin: Skin is warm and dry.  Psychiatric: She has a normal mood and affect. Her behavior is normal. Judgment and thought content normal.  Vitals reviewed.     BP (!) 174/72   Pulse (!) 51   Temp (!) 97.3 F (36.3 C) (Oral)   Ht 5' (1.524 m)   Wt 185 lb (83.9 kg)   BMI 36.13 kg/m      Assessment & Plan:  1. Essential hypertension Pt's was just started on Losartan two days ago and has follow up with Cardiologists will not hold off on adding or increasing at this time - BMP8+EGFR  2. Generalized anxiety disorder Stress management discussed  - ALPRAZolam (XANAX) 1 MG tablet; Take 1 tablet (1 mg total) by mouth 3 (three) times daily.  Dispense: 90 tablet; Refill: 5 - BMP8+EGFR  3. Hypothyroidism, unspecified type Pt's TSH was drawn two weeks ago Will recheck in 6 weeks, pt states she has been taking everyday - BMP8+EGFR     , FNP  

## 2017-07-25 NOTE — Patient Instructions (Signed)
Hypothyroidism Hypothyroidism is a disorder of the thyroid. The thyroid is a large gland that is located in the lower front of the neck. The thyroid releases hormones that control how the body works. With hypothyroidism, the thyroid does not make enough of these hormones. What are the causes? Causes of hypothyroidism may include:  Viral infections.  Pregnancy.  Your own defense system (immune system) attacking your thyroid.  Certain medicines.  Birth defects.  Past radiation treatments to your head or neck.  Past treatment with radioactive iodine.  Past surgical removal of part or all of your thyroid.  Problems with the gland that is located in the center of your brain (pituitary).  What are the signs or symptoms? Signs and symptoms of hypothyroidism may include:  Feeling as though you have no energy (lethargy).  Inability to tolerate cold.  Weight gain that is not explained by a change in diet or exercise habits.  Dry skin.  Coarse hair.  Menstrual irregularity.  Slowing of thought processes.  Constipation.  Sadness or depression.  How is this diagnosed? Your health care provider may diagnose hypothyroidism with blood tests and ultrasound tests. How is this treated? Hypothyroidism is treated with medicine that replaces the hormones that your body does not make. After you begin treatment, it may take several weeks for symptoms to go away. Follow these instructions at home:  Take medicines only as directed by your health care provider.  If you start taking any new medicines, tell your health care provider.  Keep all follow-up visits as directed by your health care provider. This is important. As your condition improves, your dosage needs may change. You will need to have blood tests regularly so that your health care provider can watch your condition. Contact a health care provider if:  Your symptoms do not get better with treatment.  You are taking thyroid  replacement medicine and: ? You sweat excessively. ? You have tremors. ? You feel anxious. ? You lose weight rapidly. ? You cannot tolerate heat. ? You have emotional swings. ? You have diarrhea. ? You feel weak. Get help right away if:  You develop chest pain.  You develop an irregular heartbeat.  You develop a rapid heartbeat. This information is not intended to replace advice given to you by your health care provider. Make sure you discuss any questions you have with your health care provider. Document Released: 06/11/2005 Document Revised: 11/17/2015 Document Reviewed: 10/27/2013 Elsevier Interactive Patient Education  2018 Elsevier Inc.  

## 2017-07-26 ENCOUNTER — Encounter: Payer: Self-pay | Admitting: *Deleted

## 2017-07-26 LAB — BMP8+EGFR
BUN/Creatinine Ratio: 13 (ref 12–28)
BUN: 13 mg/dL (ref 8–27)
CHLORIDE: 105 mmol/L (ref 96–106)
CO2: 28 mmol/L (ref 20–29)
Calcium: 9 mg/dL (ref 8.7–10.3)
Creatinine, Ser: 1.02 mg/dL — ABNORMAL HIGH (ref 0.57–1.00)
GFR calc Af Amer: 62 mL/min/{1.73_m2} (ref >59)
GFR, EST NON AFRICAN AMERICAN: 54 mL/min/{1.73_m2} — AB (ref >59)
GLUCOSE: 107 mg/dL — AB (ref 65–99)
POTASSIUM: 3.9 mmol/L (ref 3.5–5.2)
SODIUM: 147 mmol/L — AB (ref 134–144)

## 2017-08-06 ENCOUNTER — Ambulatory Visit (HOSPITAL_COMMUNITY)
Admission: RE | Admit: 2017-08-06 | Discharge: 2017-08-06 | Disposition: A | Discharge status: Home / Self Care | Payer: Medicare Other | Source: Ambulatory Visit | Attending: Cardiovascular Disease | Admitting: Cardiovascular Disease

## 2017-08-06 DIAGNOSIS — N644 Mastodynia: Secondary | ICD-10-CM | POA: Insufficient documentation

## 2017-08-06 DIAGNOSIS — R928 Other abnormal and inconclusive findings on diagnostic imaging of breast: Secondary | ICD-10-CM | POA: Diagnosis not present

## 2017-08-16 ENCOUNTER — Telehealth: Payer: Self-pay | Admitting: *Deleted

## 2017-08-16 NOTE — Telephone Encounter (Signed)
Notes recorded by Laurine Blazer, LPN on 03/23/5746 at 3:40 PM EST Patient notified. Copy to pmd. Follow up scheduled for 09/03/17 with Dr. Bronson Ing.   ------  Notes recorded by Laurine Blazer, LPN on 3/70/9643 at 83:81 PM EST Attempted to notify patient - not available. ------  Notes recorded by Herminio Commons, MD on 08/06/2017 at 10:47 AM EST No evidence of breast cancer. Should be repeated in 1 year which can be followed up by her PCP.

## 2017-09-03 ENCOUNTER — Ambulatory Visit: Payer: Medicare Other | Admitting: Cardiovascular Disease

## 2017-09-04 ENCOUNTER — Other Ambulatory Visit: Payer: Self-pay | Admitting: *Deleted

## 2017-09-04 MED ORDER — LOSARTAN POTASSIUM 100 MG PO TABS: 50.0000 mg | ORAL_TABLET | Freq: Every day | ORAL | 3 refills | Status: DC

## 2017-09-05 ENCOUNTER — Ambulatory Visit: Payer: Medicare Other | Admitting: Family

## 2017-09-06 ENCOUNTER — Encounter: Payer: Self-pay | Admitting: Cardiovascular Disease

## 2017-09-10 ENCOUNTER — Encounter: Payer: Self-pay | Admitting: Family

## 2017-09-12 ENCOUNTER — Ambulatory Visit (INDEPENDENT_AMBULATORY_CARE_PROVIDER_SITE_OTHER): Payer: Medicare Other | Admitting: Family

## 2017-09-12 ENCOUNTER — Encounter: Payer: Self-pay | Admitting: Family

## 2017-09-12 ENCOUNTER — Ambulatory Visit (INDEPENDENT_AMBULATORY_CARE_PROVIDER_SITE_OTHER): Payer: Medicare Other

## 2017-09-12 VITALS — BP 174/72 | HR 54 | Temp 98.0°F | Ht 60.0 in | Wt 182.0 lb

## 2017-09-12 DIAGNOSIS — Y92009 Unspecified place in unspecified non-institutional (private) residence as the place of occurrence of the external cause: Secondary | ICD-10-CM | POA: Diagnosis not present

## 2017-09-12 DIAGNOSIS — R0781 Pleurodynia: Secondary | ICD-10-CM | POA: Diagnosis not present

## 2017-09-12 DIAGNOSIS — S299XXA Unspecified injury of thorax, initial encounter: Secondary | ICD-10-CM | POA: Diagnosis not present

## 2017-09-12 DIAGNOSIS — W19XXXA Unspecified fall, initial encounter: Secondary | ICD-10-CM

## 2017-09-12 DIAGNOSIS — S20211A Contusion of right front wall of thorax, initial encounter: Secondary | ICD-10-CM

## 2017-09-12 DIAGNOSIS — I25118 Atherosclerotic heart disease of native coronary artery with other forms of angina pectoris: Secondary | ICD-10-CM

## 2017-09-12 NOTE — Progress Notes (Signed)
   Subjective:    Patient ID: Sharon Compton, female    DOB: [birth year], 77 y.o.   MRN: [id number]  Fall  The accident occurred 2 days ago. The fall occurred while standing. She landed on concrete. There was no blood loss. The point of impact was the left wrist (left ribs). Pain location: left ribs. The pain is at a severity of 9/10. The pain is moderate. The symptoms are aggravated by movement. Pertinent negatives include no headaches, hematuria, loss of consciousness or visual change. She has tried rest and NSAID for the symptoms. The treatment provided mild relief.      Review of Systems  Genitourinary: Negative for hematuria.  Neurological: Negative for loss of consciousness and headaches.  All other systems reviewed and are negative.      Objective:   Physical Exam  Constitutional: She is oriented to person, place, and time. She appears well-developed and well-nourished. No distress.  HENT:  Head: Normocephalic and atraumatic.  Right Ear: External ear normal.  Mouth/Throat: Oropharynx is clear and moist.  Eyes: Pupils are equal, round, and reactive to light.  Neck: Normal range of motion. Neck supple. No thyromegaly present.  Cardiovascular: Normal rate, regular rhythm, normal heart sounds and intact distal pulses.  No murmur heard. Pulmonary/Chest: Effort normal and breath sounds normal. No respiratory distress. She has no wheezes.  Abdominal: Soft. Bowel sounds are normal. She exhibits no distension. There is no tenderness.  Musculoskeletal: Normal range of motion. She exhibits tenderness (left knee with flexion and tenderness). She exhibits no edema.  Neurological: She is alert and oriented to person, place, and time.  Skin: Skin is warm and dry. Ecchymosis (left hand and wrist) noted.  Psychiatric: She has a normal mood and affect. Her behavior is normal. Judgment and thought content normal.  Vitals reviewed.   Rib x-ray- Negative for acute finding, Preliminary reading by  Evelina Dun, FNP [facility]   BP (!) 174/72   Pulse (!) 54   Temp 98 F (36.7 C) (Oral)   Ht 5' (1.524 m)   Wt 182 lb (82.6 kg)   BMI 35.54 kg/m      Assessment & Plan:  1. Rib pain - DG Ribs Unilateral W/Chest Right; Future  2. Fall in home, initial encounter - DG Ribs Unilateral W/Chest Right; Future  3. Contusion of rib on right side, initial encounter Rest Ice Deep breathing and coughing encouraged RTO prn    Evelina Dun, FNP

## 2017-09-12 NOTE — Patient Instructions (Signed)
Rib Contusion A rib contusion is a deep bruise on your rib area. Contusions are the result of a blunt trauma that causes bleeding and injury to the tissues under the skin. A rib contusion may involve bruising of the ribs and of the skin and muscles in the area. The skin overlying the contusion may turn blue, purple, or yellow. Minor injuries will give you a painless contusion, but more severe contusions may stay painful and swollen for a few weeks. What are the causes? A contusion is usually caused by a blow, trauma, or direct force to an area of the body. This often occurs while playing contact sports. What are the signs or symptoms?  Swelling and redness of the injured area.  Discoloration of the injured area.  Tenderness and soreness of the injured area.  Pain with or without movement. How is this diagnosed? The diagnosis can be made by taking a medical history and performing a physical exam. An X-ray, CT scan, or MRI may be needed to determine if there were any associated injuries, such as broken bones (fractures) or internal injuries. How is this treated? Often, the best treatment for a rib contusion is rest. Icing or applying cold compresses to the injured area may help reduce swelling and inflammation. Deep breathing exercises may be recommended to reduce the risk of partial lung collapse and pneumonia. Over-the-counter or prescription medicines may also be recommended for pain control. Follow these instructions at home:  Apply ice to the injured area: ? Put ice in a plastic bag. ? Place a towel between your skin and the bag. ? Leave the ice on for 20 minutes, 2-3 times per day.  Take medicines only as directed by your health care provider.  Rest the injured area. Avoid strenuous activity and any activities or movements that cause pain. Be careful during activities and avoid bumping the injured area.  Perform deep-breathing exercises as directed by your health care provider.  Do  not lift anything that is heavier than 5 lb (2.3 kg) until your health care provider approves.  Do not use any tobacco products, including cigarettes, chewing tobacco, or electronic cigarettes. If you need help quitting, ask your health care provider. Contact a health care provider if:  You have increased bruising or swelling.  You have pain that is not controlled with treatment.  You have a fever. Get help right away if:  You have difficulty breathing or shortness of breath.  You develop a continual cough, or you cough up thick or bloody sputum.  You feel sick to your stomach (nauseous), you throw up (vomit), or you have abdominal pain. This information is not intended to replace advice given to you by your health care provider. Make sure you discuss any questions you have with your health care provider. Document Released: 03/06/2001 Document Revised: 11/17/2015 Document Reviewed: 03/23/2014 Elsevier Interactive Patient Education  2018 [employer]  

## 2017-10-01 ENCOUNTER — Other Ambulatory Visit: Payer: Self-pay | Admitting: Family

## 2017-10-08 ENCOUNTER — Inpatient Hospital Stay (HOSPITAL_COMMUNITY): Payer: Medicare Other | Attending: Oncology | Admitting: Hematology

## 2017-10-08 ENCOUNTER — Inpatient Hospital Stay (HOSPITAL_COMMUNITY): Payer: Medicare Other

## 2017-10-08 ENCOUNTER — Encounter (HOSPITAL_COMMUNITY): Payer: Self-pay | Admitting: Hematology

## 2017-10-08 DIAGNOSIS — D508 Other iron deficiency anemias: Secondary | ICD-10-CM

## 2017-10-08 DIAGNOSIS — R0602 Shortness of breath: Secondary | ICD-10-CM | POA: Insufficient documentation

## 2017-10-08 DIAGNOSIS — I2510 Atherosclerotic heart disease of native coronary artery without angina pectoris: Secondary | ICD-10-CM

## 2017-10-08 DIAGNOSIS — R110 Nausea: Secondary | ICD-10-CM | POA: Insufficient documentation

## 2017-10-08 DIAGNOSIS — J449 Chronic obstructive pulmonary disease, unspecified: Secondary | ICD-10-CM

## 2017-10-08 DIAGNOSIS — Z7982 Long term (current) use of aspirin: Secondary | ICD-10-CM | POA: Insufficient documentation

## 2017-10-08 DIAGNOSIS — I252 Old myocardial infarction: Secondary | ICD-10-CM | POA: Diagnosis not present

## 2017-10-08 DIAGNOSIS — K5900 Constipation, unspecified: Secondary | ICD-10-CM | POA: Diagnosis not present

## 2017-10-08 DIAGNOSIS — R5383 Other fatigue: Secondary | ICD-10-CM | POA: Diagnosis not present

## 2017-10-08 DIAGNOSIS — Z79899 Other long term (current) drug therapy: Secondary | ICD-10-CM | POA: Diagnosis not present

## 2017-10-08 DIAGNOSIS — Z87891 Personal history of nicotine dependence: Secondary | ICD-10-CM | POA: Insufficient documentation

## 2017-10-08 DIAGNOSIS — N189 Chronic kidney disease, unspecified: Secondary | ICD-10-CM | POA: Diagnosis not present

## 2017-10-08 DIAGNOSIS — R42 Dizziness and giddiness: Secondary | ICD-10-CM | POA: Diagnosis not present

## 2017-10-08 DIAGNOSIS — K219 Gastro-esophageal reflux disease without esophagitis: Secondary | ICD-10-CM | POA: Diagnosis not present

## 2017-10-08 DIAGNOSIS — D509 Iron deficiency anemia, unspecified: Secondary | ICD-10-CM | POA: Diagnosis not present

## 2017-10-08 DIAGNOSIS — M797 Fibromyalgia: Secondary | ICD-10-CM | POA: Diagnosis not present

## 2017-10-08 DIAGNOSIS — I129 Hypertensive chronic kidney disease with stage 1 through stage 4 chronic kidney disease, or unspecified chronic kidney disease: Secondary | ICD-10-CM | POA: Insufficient documentation

## 2017-10-08 DIAGNOSIS — I4891 Unspecified atrial fibrillation: Secondary | ICD-10-CM

## 2017-10-08 DIAGNOSIS — F418 Other specified anxiety disorders: Secondary | ICD-10-CM | POA: Diagnosis not present

## 2017-10-08 LAB — CBC WITH DIFFERENTIAL/PLATELET
BASOS PCT: 1 %
Basophils Absolute: 0.1 10*3/uL (ref 0.0–0.1)
EOS ABS: 0.1 10*3/uL (ref 0.0–0.7)
EOS PCT: 1 %
HCT: 46.2 % — ABNORMAL HIGH (ref 36.0–46.0)
HEMOGLOBIN: 15 g/dL (ref 12.0–15.0)
LYMPHS ABS: 2.3 10*3/uL (ref 0.7–4.0)
Lymphocytes Relative: 41 %
MCH: 28.6 pg (ref 26.0–34.0)
MCHC: 32.5 g/dL (ref 30.0–36.0)
MCV: 88 fL (ref 78.0–100.0)
MONO ABS: 0.4 10*3/uL (ref 0.1–1.0)
MONOS PCT: 7 %
NEUTROS PCT: 50 %
Neutro Abs: 2.7 10*3/uL (ref 1.7–7.7)
PLATELETS: 198 10*3/uL (ref 150–400)
RBC: 5.25 MIL/uL — ABNORMAL HIGH (ref 3.87–5.11)
RDW: 16 % — AB (ref 11.5–15.5)
WBC: 5.6 10*3/uL (ref 4.0–10.5)

## 2017-10-08 LAB — IRON AND TIBC
IRON: 106 ug/dL (ref 28–170)
Saturation Ratios: 33 % — ABNORMAL HIGH (ref 10.4–31.8)
TIBC: 325 ug/dL (ref 250–450)
UIBC: 219 ug/dL

## 2017-10-08 LAB — COMPREHENSIVE METABOLIC PANEL
ALBUMIN: 4.1 g/dL (ref 3.5–5.0)
ALK PHOS: 88 U/L (ref 38–126)
ALT: 41 U/L (ref 14–54)
ANION GAP: 12 (ref 5–15)
AST: 53 U/L — ABNORMAL HIGH (ref 15–41)
BUN: 16 mg/dL (ref 6–20)
CALCIUM: 9.1 mg/dL (ref 8.9–10.3)
CO2: 30 mmol/L (ref 22–32)
Chloride: 98 mmol/L — ABNORMAL LOW (ref 101–111)
Creatinine, Ser: 1.33 mg/dL — ABNORMAL HIGH (ref 0.44–1.00)
GFR calc non Af Amer: 38 mL/min — ABNORMAL LOW (ref >60)
GFR, EST AFRICAN AMERICAN: 44 mL/min — AB (ref >60)
GLUCOSE: 107 mg/dL — AB (ref 65–99)
POTASSIUM: 3.6 mmol/L (ref 3.5–5.1)
SODIUM: 140 mmol/L (ref 135–145)
TOTAL PROTEIN: 7.6 g/dL (ref 6.5–8.1)
Total Bilirubin: 0.8 mg/dL (ref 0.3–1.2)

## 2017-10-08 LAB — FERRITIN: Ferritin: 90 ng/mL (ref 11–307)
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
## 2017-10-08 NOTE — Assessment & Plan Note (Signed)
1.  Iron deficiency anemia: - She has poor absorption on oral iron therapy.  Last Feraheme infusion was on 04/29/2017 and 05/06/2017. -Her ferritin in January 2019 was 141.  Percent saturation was 34.  Iron panel and ferritin from today are pending.  However her hemoglobin has improved to 15. -She does not require any parenteral iron at this time.  We will follow her in 4 months.  We will repeat blood work at that time.  2.  CKD: Creatinine is 1.33.  This is also likely contributing to her anemia.  We will closely monitor it.

## 2017-10-08 NOTE — Patient Instructions (Signed)
[facility]  Discharge Instructions:  You were seen by Dr. Katragadda today.   _______________________________________________________________  Thank you for choosing [facility] at [facility] to provide your oncology and hematology care.  To afford each patient quality time with our providers, please arrive at least 15 minutes before your scheduled appointment.  You need to re-schedule your appointment if you arrive 10 or more minutes late.  We strive to give you quality time with our providers, and arriving late affects you and other patients whose appointments are after yours.  Also, if you no show three or more times for appointments you may be dismissed from the clinic.  Again, thank you for choosing [facility]. Our hope is that these requests will allow you access to exceptional care and in a timely manner. _______________________________________________________________  If you have questions after your visit, please contact our office at [phone] between the hours of 8:30 a.m. and 5:00 p.m. Voicemails left after 4:30 p.m. will not be returned until the following business day. _______________________________________________________________  For prescription refill requests, have your pharmacy contact our office. _______________________________________________________________  Recommendations made by the consultant and any test results will be sent to your referring physician. _______________________________________________________________ 

## 2017-11-04 ENCOUNTER — Other Ambulatory Visit: Payer: Self-pay | Admitting: Cardiovascular Disease

## 2017-11-08 ENCOUNTER — Other Ambulatory Visit: Payer: Self-pay | Admitting: *Deleted

## 2017-11-08 MED ORDER — DILTIAZEM HCL ER COATED BEADS 240 MG PO CP24: 240.0000 mg | ORAL_CAPSULE | Freq: Every day | ORAL | 1 refills | Status: DC

## 2017-12-13 ENCOUNTER — Encounter: Payer: Self-pay | Admitting: Family

## 2017-12-13 ENCOUNTER — Ambulatory Visit (INDEPENDENT_AMBULATORY_CARE_PROVIDER_SITE_OTHER): Payer: Medicare Other | Admitting: Family

## 2017-12-13 VITALS — BP 159/72 | HR 47 | Temp 97.0°F | Ht 60.0 in | Wt 180.6 lb

## 2017-12-13 DIAGNOSIS — Z1159 Encounter for screening for other viral diseases: Secondary | ICD-10-CM | POA: Diagnosis not present

## 2017-12-13 DIAGNOSIS — M545 Low back pain, unspecified: Secondary | ICD-10-CM

## 2017-12-13 DIAGNOSIS — I25118 Atherosclerotic heart disease of native coronary artery with other forms of angina pectoris: Secondary | ICD-10-CM

## 2017-12-13 DIAGNOSIS — E039 Hypothyroidism, unspecified: Secondary | ICD-10-CM

## 2017-12-13 DIAGNOSIS — F411 Generalized anxiety disorder: Secondary | ICD-10-CM | POA: Diagnosis not present

## 2017-12-13 DIAGNOSIS — W19XXXA Unspecified fall, initial encounter: Secondary | ICD-10-CM | POA: Diagnosis not present

## 2017-12-13 DIAGNOSIS — Y92009 Unspecified place in unspecified non-institutional (private) residence as the place of occurrence of the external cause: Secondary | ICD-10-CM | POA: Diagnosis not present

## 2017-12-13 MED ORDER — METOPROLOL TARTRATE 25 MG PO TABS: ORAL_TABLET | ORAL | 1 refills | Status: DC

## 2017-12-13 MED ORDER — MELOXICAM 7.5 MG PO TABS
7.5000 mg | ORAL_TABLET | Freq: Every day | ORAL | 0 refills | Status: DC
Start: 2017-12-13 — End: 2018-01-29

## 2017-12-13 NOTE — Patient Instructions (Signed)

## 2017-12-13 NOTE — Progress Notes (Signed)
Subjective:    Patient ID: Sharon Compton, female    DOB: 04/25/1941, 77 y.o.   MRN: [id number]  Chief Complaint  Patient presents with  . Back Pain    fell in house   PT presents to the office today with back pain after a falll. Pt states she was helping her husband walk to the bathroom and he lost his balance and both of them fell.  Back Pain  This is a chronic problem. The current episode started more than 1 year ago. The problem occurs intermittently. The problem has been waxing and waning since onset. The quality of the pain is described as aching. The pain does not radiate. The pain is at a severity of 9/10. The pain is moderate. Pertinent negatives include no bladder incontinence, bowel incontinence or leg pain. Risk factors include recent trauma. She has tried bed rest and NSAIDs for the symptoms. The treatment provided mild relief.  Thyroid Problem  Presents for follow-up visit. Symptoms include anxiety, constipation, fatigue and hoarse voice. Patient reports no diarrhea. The symptoms have been stable.  Anxiety  Presents for follow-up visit. Symptoms include decreased concentration, excessive worry, irritability and nervous/anxious behavior. Symptoms occur most days. The severity of symptoms is mild.        Review of Systems  Constitutional: Positive for fatigue and irritability.  HENT: Positive for hoarse voice.   Gastrointestinal: Positive for constipation. Negative for bowel incontinence and diarrhea.  Genitourinary: Negative for bladder incontinence.  Musculoskeletal: Positive for back pain.  Psychiatric/Behavioral: Positive for decreased concentration. The patient is nervous/anxious.   All other systems reviewed and are negative.      Objective:   Physical Exam  Constitutional: She is oriented to person, place, and time. She appears well-developed and well-nourished. No distress.  HENT:  Head: Normocephalic and atraumatic.  Right Ear: External ear normal.  Left  Ear: External ear normal.  Mouth/Throat: Oropharynx is clear and moist.  Eyes: Pupils are equal, round, and reactive to light.  Neck: Normal range of motion. Neck supple. No thyromegaly present.  Cardiovascular: Normal rate, regular rhythm, normal heart sounds and intact distal pulses.  No murmur heard. Pulmonary/Chest: Effort normal and breath sounds normal. No respiratory distress. She has no wheezes.  Abdominal: Soft. Bowel sounds are normal. She exhibits no distension. There is tenderness (mild generalized).  Musculoskeletal: Normal range of motion. She exhibits no edema or tenderness.  Neurological: She is alert and oriented to person, place, and time. She has normal reflexes. No cranial nerve deficit.  Skin: Skin is warm and dry.  Psychiatric: She has a normal mood and affect. Her behavior is normal. Judgment and thought content normal.  Vitals reviewed.     BP (!) 159/72   Pulse (!) 47   Temp (!) 97 F (36.1 C) (Oral)   Ht 5' (1.524 m)   Wt 180 lb 9.6 oz (81.9 kg)   BMI 35.27 kg/m      Assessment & Plan:  Sharon Compton comes in today with chief complaint of Back Pain (fell in house)   Diagnosis and orders addressed:  1. Generalized anxiety disorder - CMP14+EGFR  2. Morbid obesity ([place]) - CMP14+EGFR  3. Hypothyroidism, unspecified type - TSH - CMP14+EGFR  4. Fall in home, initial encounter - CMP14+EGFR  5. Acute bilateral low back pain without sciatica Rest Ice Fall precautions discussed - CMP14+EGFR - meloxicam (MOBIC) 7.5 MG tablet; Take 1 tablet (7.5 mg total) by mouth daily.  Dispense: 20 tablet;  Refill: 0  6. Need for hepatitis C screening test - Hepatitis C antibody - CMP14+EGFR   Labs pending Health Maintenance reviewed Diet and exercise encouraged  Follow up plan: 3 months    Evelina Dun, FNP

## 2017-12-14 LAB — CMP14+EGFR
A/G RATIO: 1.4 (ref 1.2–2.2)
ALT: 38 IU/L — ABNORMAL HIGH (ref 0–32)
AST: 47 IU/L — AB (ref 0–40)
Albumin: 3.9 g/dL (ref 3.5–4.8)
Alkaline Phosphatase: 99 IU/L (ref 39–117)
BUN/Creatinine Ratio: 10 — ABNORMAL LOW (ref 12–28)
BUN: 13 mg/dL (ref 8–27)
Bilirubin Total: 0.4 mg/dL (ref 0.0–1.2)
CALCIUM: 9.1 mg/dL (ref 8.7–10.3)
CO2: 25 mmol/L (ref 20–29)
CREATININE: 1.36 mg/dL — AB (ref 0.57–1.00)
Chloride: 102 mmol/L (ref 96–106)
GFR calc Af Amer: 44 mL/min/{1.73_m2} — ABNORMAL LOW (ref >59)
GFR, EST NON AFRICAN AMERICAN: 38 mL/min/{1.73_m2} — AB (ref >59)
Globulin, Total: 2.8 g/dL (ref 1.5–4.5)
Glucose: 103 mg/dL — ABNORMAL HIGH (ref 65–99)
POTASSIUM: 4.1 mmol/L (ref 3.5–5.2)
Sodium: 144 mmol/L (ref 134–144)
Total Protein: 6.7 g/dL (ref 6.0–8.5)

## 2017-12-14 LAB — TSH: TSH: 4.32 u[IU]/mL (ref 0.450–4.500)

## 2017-12-14 LAB — HEPATITIS C ANTIBODY: Hep C Virus Ab: <0.1 s/co ratio (ref 0.0–0.9)

## 2018-01-02 ENCOUNTER — Other Ambulatory Visit: Payer: Self-pay | Admitting: Nurse Practitioner

## 2018-01-02 DIAGNOSIS — Z955 Presence of coronary angioplasty implant and graft: Secondary | ICD-10-CM

## 2018-01-17 ENCOUNTER — Other Ambulatory Visit: Payer: Self-pay | Admitting: Family

## 2018-01-17 ENCOUNTER — Other Ambulatory Visit: Payer: Self-pay | Admitting: Family Medicine

## 2018-01-17 DIAGNOSIS — F411 Generalized anxiety disorder: Secondary | ICD-10-CM

## 2018-01-17 NOTE — Telephone Encounter (Signed)
Last seen 12/13/17  Sharon Compton

## 2018-01-29 ENCOUNTER — Encounter (HOSPITAL_COMMUNITY): Payer: Self-pay | Admitting: Emergency Medicine

## 2018-01-29 ENCOUNTER — Ambulatory Visit (INDEPENDENT_AMBULATORY_CARE_PROVIDER_SITE_OTHER): Payer: Medicare Other | Admitting: Cardiovascular Disease

## 2018-01-29 ENCOUNTER — Encounter: Payer: Self-pay | Admitting: Cardiovascular Disease

## 2018-01-29 ENCOUNTER — Emergency Department (HOSPITAL_COMMUNITY): Payer: Medicare Other

## 2018-01-29 ENCOUNTER — Emergency Department (HOSPITAL_COMMUNITY)
Admission: EM | Admit: 2018-01-29 | Discharge: 2018-01-30 | Disposition: A | Discharge status: Home / Self Care | Payer: Medicare Other | Attending: Emergency Medicine | Admitting: Emergency Medicine

## 2018-01-29 ENCOUNTER — Other Ambulatory Visit: Payer: Self-pay

## 2018-01-29 VITALS — BP 158/60 | HR 54 | Ht 60.0 in | Wt 177.0 lb

## 2018-01-29 DIAGNOSIS — I5033 Acute on chronic diastolic (congestive) heart failure: Secondary | ICD-10-CM

## 2018-01-29 DIAGNOSIS — Z87891 Personal history of nicotine dependence: Secondary | ICD-10-CM | POA: Diagnosis not present

## 2018-01-29 DIAGNOSIS — R600 Localized edema: Secondary | ICD-10-CM | POA: Diagnosis not present

## 2018-01-29 DIAGNOSIS — I25118 Atherosclerotic heart disease of native coronary artery with other forms of angina pectoris: Secondary | ICD-10-CM

## 2018-01-29 DIAGNOSIS — I509 Heart failure, unspecified: Secondary | ICD-10-CM | POA: Diagnosis not present

## 2018-01-29 DIAGNOSIS — I110 Hypertensive heart disease with heart failure: Secondary | ICD-10-CM | POA: Insufficient documentation

## 2018-01-29 DIAGNOSIS — R0602 Shortness of breath: Secondary | ICD-10-CM | POA: Diagnosis not present

## 2018-01-29 DIAGNOSIS — E876 Hypokalemia: Secondary | ICD-10-CM | POA: Diagnosis not present

## 2018-01-29 DIAGNOSIS — R001 Bradycardia, unspecified: Secondary | ICD-10-CM | POA: Diagnosis not present

## 2018-01-29 DIAGNOSIS — R0609 Other forms of dyspnea: Secondary | ICD-10-CM | POA: Diagnosis not present

## 2018-01-29 DIAGNOSIS — I4819 Other persistent atrial fibrillation: Secondary | ICD-10-CM

## 2018-01-29 DIAGNOSIS — I10 Essential (primary) hypertension: Secondary | ICD-10-CM

## 2018-01-29 DIAGNOSIS — I481 Persistent atrial fibrillation: Secondary | ICD-10-CM

## 2018-01-29 DIAGNOSIS — R2243 Localized swelling, mass and lump, lower limb, bilateral: Secondary | ICD-10-CM | POA: Diagnosis present

## 2018-01-29 DIAGNOSIS — R609 Edema, unspecified: Secondary | ICD-10-CM

## 2018-01-29 LAB — I-STAT CHEM 8, ED
BUN: 29 mg/dL — ABNORMAL HIGH (ref 8–23)
CHLORIDE: 97 mmol/L — AB (ref 98–111)
CREATININE: 1.7 mg/dL — AB (ref 0.44–1.00)
Calcium, Ion: 1.09 mmol/L — ABNORMAL LOW (ref 1.15–1.40)
GLUCOSE: 128 mg/dL — AB (ref 70–99)
HCT: 44 % (ref 36.0–46.0)
Hemoglobin: 15 g/dL (ref 12.0–15.0)
POTASSIUM: 3.2 mmol/L — AB (ref 3.5–5.1)
Sodium: 141 mmol/L (ref 135–145)
TCO2: 31 mmol/L (ref 22–32)

## 2018-01-29 LAB — COMPREHENSIVE METABOLIC PANEL
ALBUMIN: 4.1 g/dL (ref 3.5–5.0)
ALK PHOS: 85 U/L (ref 38–126)
ALT: 38 U/L (ref 0–44)
AST: 53 U/L — ABNORMAL HIGH (ref 15–41)
Anion gap: 10 (ref 5–15)
BILIRUBIN TOTAL: 0.7 mg/dL (ref 0.3–1.2)
BUN: 30 mg/dL — ABNORMAL HIGH (ref 8–23)
CALCIUM: 8.7 mg/dL — AB (ref 8.9–10.3)
CO2: 30 mmol/L (ref 22–32)
CREATININE: 1.95 mg/dL — AB (ref 0.44–1.00)
Chloride: 99 mmol/L (ref 98–111)
GFR calc non Af Amer: 24 mL/min — ABNORMAL LOW (ref >60)
GFR, EST AFRICAN AMERICAN: 27 mL/min — AB (ref >60)
GLUCOSE: 109 mg/dL — AB (ref 70–99)
Potassium: 2.7 mmol/L — CL (ref 3.5–5.1)
SODIUM: 139 mmol/L (ref 135–145)
TOTAL PROTEIN: 7.5 g/dL (ref 6.5–8.1)

## 2018-01-29 LAB — CBC WITH DIFFERENTIAL/PLATELET
BASOS ABS: 0.1 10*3/uL (ref 0.0–0.1)
BASOS PCT: 1 %
EOS ABS: 0 10*3/uL (ref 0.0–0.7)
Eosinophils Relative: 1 %
HEMATOCRIT: 46.5 % — AB (ref 36.0–46.0)
HEMOGLOBIN: 15.1 g/dL — AB (ref 12.0–15.0)
Lymphocytes Relative: 32 %
Lymphs Abs: 2.3 10*3/uL (ref 0.7–4.0)
MCH: 28.8 pg (ref 26.0–34.0)
MCHC: 32.5 g/dL (ref 30.0–36.0)
MCV: 88.7 fL (ref 78.0–100.0)
Monocytes Absolute: 0.6 10*3/uL (ref 0.1–1.0)
Monocytes Relative: 8 %
NEUTROS ABS: 4.2 10*3/uL (ref 1.7–7.7)
NEUTROS PCT: 58 %
Platelets: 191 10*3/uL (ref 150–400)
RBC: 5.24 MIL/uL — ABNORMAL HIGH (ref 3.87–5.11)
RDW: 16.1 % — ABNORMAL HIGH (ref 11.5–15.5)
WBC: 7.2 10*3/uL (ref 4.0–10.5)

## 2018-01-29 LAB — TROPONIN I: Troponin I: <0.03 ng/mL (ref <0.03)

## 2018-01-29 LAB — MAGNESIUM: Magnesium: 2.2 mg/dL (ref 1.7–2.4)

## 2018-01-29 LAB — BRAIN NATRIURETIC PEPTIDE: B Natriuretic Peptide: 94 pg/mL (ref 0.0–100.0)

## 2018-01-29 MED ORDER — POTASSIUM CHLORIDE CRYS ER 20 MEQ PO TBCR: 40.0000 meq | EXTENDED_RELEASE_TABLET | Freq: Two times a day (BID) | ORAL | 0 refills | Status: DC

## 2018-01-29 MED ORDER — FUROSEMIDE 10 MG/ML IJ SOLN
80.0000 mg | Freq: Once | INTRAMUSCULAR | Status: AC
  Administered 2018-01-29: 80 mg via INTRAVENOUS
  Filled 2018-01-29: qty 8

## 2018-01-29 MED ORDER — MAGNESIUM SULFATE 2 GM/50ML IV SOLN
2.0000 g | Freq: Once | INTRAVENOUS | Status: AC
  Administered 2018-01-29: 2 g via INTRAVENOUS
  Filled 2018-01-29: qty 50

## 2018-01-29 MED ORDER — POTASSIUM CHLORIDE CRYS ER 20 MEQ PO TBCR
80.0000 meq | EXTENDED_RELEASE_TABLET | Freq: Once | ORAL | Status: AC
  Administered 2018-01-29: 80 meq via ORAL
  Filled 2018-01-29: qty 4

## 2018-01-29 MED ORDER — POTASSIUM CHLORIDE 10 MEQ/100ML IV SOLN
10.0000 meq | Freq: Once | INTRAVENOUS | Status: AC
  Administered 2018-01-29: 10 meq via INTRAVENOUS
  Filled 2018-01-29: qty 100

## 2018-01-29 NOTE — Patient Instructions (Signed)
Medication Instructions:  Your physician recommends that you continue on your current medications as directed. Please refer to the Current Medication list given to you today.  Labwork: NONE  Testing/Procedures: NONE  Follow-Up: Your physician recommends that you schedule a follow-up appointment PLEASE GO TO THE EMERGENCY ROOM AT [facility]   Any Other Special Instructions Will Be Listed Below (If Applicable).  If you need a refill on your cardiac medications before your next appointment, please call your pharmacy.

## 2018-01-29 NOTE — Discharge Instructions (Signed)
Please double the amount of torsemide you are taking for the next 5 days and follow-up with her cardiologist to make sure that your edema is improving.  Also get your potassium rechecked to make sure that the potassium is helping and that your kidneys are improving.

## 2018-01-29 NOTE — ED Notes (Signed)
Date and time results received: 01/29/18 7:16 PM   Test: Potassium Critical Value: 2.7  Name of Provider Notified: J. Mesner  Orders Received? Or Actions Taken?: MD notified

## 2018-01-29 NOTE — ED Notes (Signed)
Pulse Ox 95% while ambulating

## 2018-01-29 NOTE — ED Triage Notes (Signed)
Bilateral edema, slight sob. Pt states has been getting worse over past few days.

## 2018-01-29 NOTE — ED Provider Notes (Addendum)
Emergency Department Provider Note   I have reviewed the triage vital signs and the nursing notes.   HISTORY  Chief Complaint Leg Swelling   HPI Sharon Compton is a 77 y.o. female multiple medical problems as documented below the presents the emergency department today secondary to bilateral lower extremity edema and mild dyspnea with exertion.  She saw Dr. Bronson Ing in the office who recommended she come here for IV diuresis.  She states that this is the worst is ever been but it has been building up and does not seem to be making much urine with torsemide.  She states that her abdomen is swollen and both legs are swollen her feet are so swollen she can barely get her shoes on.  She had episode of chest pain a couple weeks ago but nothing since then.  No rashes. No other associated or modifying symptoms.    Past Medical History:  Diagnosis Date  . A-fib [facility])    Early 2013, had TEE/DCCV at [facility]; pt reports DCCV 2014 at [facility] as well.  . Anxiety and depression   . Asthma   . 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 CAD (coronary artery disease) 02/2013   2.75 x 32 mm Rebel bare metal stent in the distal RCA.   [name] COPD (chronic obstructive pulmonary disease) ([place])   . Diverticular disease 10/01/2011  . Fibromyalgia   . GERD (gastroesophageal reflux disease)   . GI bleed    Recurrent, hx cecal AVMs, ablated 07/2011; hx PUD also  . Hiatal hernia   . History of pneumonia   . Hypertension   . Lymphedema   . MI, acute, non ST segment elevation ([place]) 10/02/2011  . Peptic ulcer disease   . Peripheral edema     Patient Active Problem List   Diagnosis Date Noted  . Edema of extremities 01/28/2017  . CHF (congestive heart failure) ([place]) 01/28/2017  . Chronic atrial fibrillation ([place])   . Hypothyroidism   . CAD (coronary artery disease) of artery bypass graft 01/21/2015  . Esophageal reflux   . Essential hypertension   . Depression   . Iron deficiency anemia  03/19/2014  . Insomnia 07/16/2013  . Major depressive disorder, recurrent episode, severe ([place]) 06/14/2013  . Posttraumatic stress disorder 06/14/2013  . CAD (coronary artery disease), native coronary artery 03/05/2013  . Angina, class III ([place]) 03/05/2013  . Hypokalemia 03/05/2013  . Hyperglycemia 10/03/2011  . Atrial fibrillation ([place]) 10/01/2011  . 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 Diverticulosis 10/01/2011  . Elevated LFTs 08/22/2011  . Anxiety disorder 08/21/2011  . GI bleed 08/20/2011  . Morbid obesity ([place]) 08/20/2011    Past Surgical History:  Procedure Laterality Date  . abd tumor removed     states was 10 lbs, benign  . ABDOMINAL EXPLORATION SURGERY    . ABDOMINAL HYSTERECTOMY  age 12   nonmalignant reason  . bladder stent    . CARDIAC CATHETERIZATION N/A 01/24/2015   Procedure: Left Heart Cath and Coronary Angiography;  Surgeon: Troy Sine, MD;  Location: [place] CV LAB;  Service: Cardiovascular;  Laterality: N/A;  . CARDIOVASCULAR STRESS TEST  09/2011   equivocal result, most likely low risk; pt refused the recommended cardiac cath to follow this up.  . CHOLECYSTECTOMY    . COLONOSCOPY  08/25/2011   Procedure: COLONOSCOPY;  Surgeon: Daneil Dolin, MD;  [facility];  Service: Endoscopy;  Laterality: N/A;  . COLONOSCOPY  10/03/2011  Procedure: COLONOSCOPY;  Surgeon: Daneil Dolin, MD;  [facility];  Service: Endoscopy;  Laterality: N/A;  NEEDS PHENERGAN 25 MG IV ON CALL  . COLONOSCOPY  10/04/2011   Procedure: COLONOSCOPY;  Surgeon: Daneil Dolin, MD;  [facility];  Service: Endoscopy;  Laterality: N/A;  Phenergan 12.5 mg ON CALL  . CORONARY ANGIOPLASTY  02/2013  . ESOPHAGOGASTRODUODENOSCOPY  08/24/2011   Procedure: ESOPHAGOGASTRODUODENOSCOPY (EGD);  Surgeon: Daneil Dolin, MD;  [facility];  Service: Endoscopy;  Laterality: N/A;  give phenergan 12.5mg  iv 30 mins prior to procedure  . KNEE SURGERY     . LEFT AND RIGHT HEART CATHETERIZATION WITH CORONARY ANGIOGRAM N/A 03/03/2013   Procedure: LEFT AND RIGHT HEART CATHETERIZATION WITH CORONARY ANGIOGRAM;  Surgeon: Burnell Blanks, MD;  Location: [facility] CATH LAB;  Service: Cardiovascular;  Laterality: N/A;  . PERCUTANEOUS CORONARY STENT INTERVENTION (PCI-S)  02/2013   2.75 x 32 mm Rebel bare metal stent in the distal RCA.   [name] TRANSTHORACIC ECHOCARDIOGRAM  09/2011   EF 60-65%, septal hypokinesia    Current Outpatient Rx  . Order #: [id number] Class: Normal  . Order #: [id number] Class: Normal  . Order #: [id number] Class: Historical Med  . Order #: [id number] Class: Historical Med  . Order #: [id number] Class: Normal  . Order #: [id number] Class: Historical Med  . Order #: [id number] Class: Normal  . Order #: [id number] Class: Normal  . Order #: [id number] Class: Normal  . Order #: [id number] Class: Normal  . Order #: [id number] Class: Normal  . Order #: [id number] Class: Normal    Allergies Iohexol; Dye fdc red [red dye]; Sulfa antibiotics; and Codeine  Family History  Problem Relation Age of Onset  . Diabetes Mother        Deceased  . Hypertension Mother   . Coronary artery disease Mother   . Heart failure Mother   . Cancer Father        Deceased  . Colon cancer Neg Hx     Social History Social History   Tobacco Use  . Smoking status: Former Smoker    Packs/day: 1.00    Years: 15.00    Pack years: 15.00    Types: Cigarettes    Last attempt to quit: 06/26/1995    Years since quitting: 22.6  . Smokeless tobacco: Never Used  Substance Use Topics  . Alcohol use: No    Alcohol/week: 0.0 oz  . Drug use: No    Review of Systems  All other systems negative except as documented in the HPI. All pertinent positives and negatives as reviewed in the HPI. ____________________________________________   PHYSICAL EXAM:  VITAL SIGNS: ED Triage Vitals  Enc Vitals Group     BP 01/29/18 1728 137/69     Pulse Rate 01/29/18 1728 (!) 58      Resp 01/29/18 1728 20     Temp 01/29/18 1728 97.7 F (36.5 C)     Temp Source 01/29/18 1728 Oral     SpO2 01/29/18 1728 96 %     Weight 01/29/18 1729 177 lb (80.3 kg)     Height 01/29/18 1729 5' (1.524 m)     Head Circumference --      Peak Flow --      Pain Score 01/29/18 1729 5     Pain Loc --      Pain Edu? --      Excl. in [place]? --     Constitutional: Alert and oriented. Well appearing and in no acute distress.  Eyes: Conjunctivae are normal. PERRL. EOMI. Head: Atraumatic. Nose: No congestion/rhinnorhea. Mouth/Throat: Mucous membranes are moist.  Oropharynx non-erythematous. Neck: No stridor.  No meningeal signs.   Cardiovascular: Normal rate, regular rhythm. Good peripheral circulation. Grossly normal heart sounds.   Respiratory: Normal respiratory effort.  No retractions. Lungs CTAB. Gastrointestinal: Soft and nontender. No distention.  Musculoskeletal: No lower extremity tenderness but has signficant BLE edema to above her knees. No gross deformities of extremities. Neurologic:  Normal speech and language. No gross focal neurologic deficits are appreciated.  Skin:  Skin is warm, dry and intact. No rash noted.   ____________________________________________   LABS (all labs ordered are listed, but only abnormal results are displayed)  Labs Reviewed  CBC WITH DIFFERENTIAL/PLATELET - Abnormal; Notable for the following components:      Result Value   RBC 5.24 (*)    Hemoglobin 15.1 (*)    HCT 46.5 (*)    RDW 16.1 (*)    All other components within normal limits  COMPREHENSIVE METABOLIC PANEL - Abnormal; Notable for the following components:   Potassium 2.7 (*)    Glucose, Bld 109 (*)    BUN 30 (*)    Creatinine, Ser 1.95 (*)    Calcium 8.7 (*)    AST 53 (*)    GFR calc non Af Amer 24 (*)    GFR calc Af Amer 27 (*)    All other components within normal limits  I-STAT CHEM 8, ED - Abnormal; Notable for the following components:   Potassium 3.2 (*)    Chloride 97  (*)    BUN 29 (*)    Creatinine, Ser 1.70 (*)    Glucose, Bld 128 (*)    Calcium, Ion 1.09 (*)    All other components within normal limits  TROPONIN I  BRAIN NATRIURETIC PEPTIDE  MAGNESIUM   ____________________________________________  EKG   EKG Interpretation  Date/Time:  Wednesday January 29 2018 19:28:55 EDT Ventricular Rate:  49 PR Interval:    QRS Duration: 109 QT Interval:  647 QTC Calculation: 585 R Axis:   73 Text Interpretation:  Sinus bradycardia Borderline repolarization abnormality Prolonged QT interval now sinus rhythm prolonged QT interval new Confirmed by Merrily Pew [phone]) on 01/29/2018 7:56:05 PM      ____________________________________________  RADIOLOGY  Dg Chest 2 View  Result Date: 01/29/2018 CLINICAL DATA:  77 year old female with history of bilateral leg edema for the past 5 years progressively worsening with worsening shortness of breath today. EXAM: CHEST - 2 VIEW COMPARISON:  Chest x-ray 09/12/2017. FINDINGS: Lung volumes are normal. No consolidative airspace disease. No pleural effusions. No pneumothorax. No pulmonary nodule or mass noted. Pulmonary vasculature and the cardiomediastinal silhouette are within normal limits. Aortic atherosclerosis. Surgical clips project over the right upper quadrant of the abdomen, likely from prior cholecystectomy. IMPRESSION: 1.  No radiographic evidence of acute cardiopulmonary disease. 2. Aortic atherosclerosis. Electronically Signed   By: Vinnie Langton M.D.   On: 01/29/2018 18:09    ____________________________________________   PROCEDURES  Procedure(s) performed:   Procedures  CRITICAL CARE Performed by: Merrily Pew Total critical care time: 35 minutes Critical care time was exclusive of separately billable procedures and treating other patients. Critical care was necessary to treat or prevent imminent or life-threatening deterioration. Critical care was time spent personally by me on the  following activities: development of treatment plan with patient and/or surrogate as well as nursing, discussions with consultants, evaluation of patient's response to treatment, examination of patient, obtaining  history from patient or surrogate, ordering and performing treatments and interventions, ordering and review of laboratory studies, ordering and review of radiographic studies, pulse oximetry and re-evaluation of patient's condition.  ____________________________________________   INITIAL IMPRESSION / ASSESSMENT AND PLAN / ED COURSE  Patient prefer not stay in the hospital if possible so we will try to replete her potassium, IV Lasix and some magnesium.  Will need repeat EKG and labs to ensure these have improved prior to discharge if she is feeling better and diuresing with medications in the emergency room.  Diuresed a significant amount emergency room. Able ambulate without difficulty.  Her shortness of breath and chest pain in both improved significantly. Potassium improved to 3.2.  Kidney function improved as well.  Will discharge on increased dose of her torsemide along with starting potassium.  She will knows to recheck her potassium and kidney function in about a week to make sure that they are continuing to do okay.  She will talk to Dr. Bronson Ing about getting a follow-up appointment for her peripheral edema.   Pertinent labs & imaging results that were available during my care of the patient were reviewed by me and considered in my medical decision making (see chart for details).  ____________________________________________  FINAL CLINICAL IMPRESSION(S) / ED DIAGNOSES  Final diagnoses:  Peripheral edema  Hypokalemia     MEDICATIONS GIVEN DURING THIS VISIT:  Medications  furosemide (LASIX) injection 80 mg (80 mg Intravenous Given 01/29/18 2032)  potassium chloride SA (K-DUR,KLOR-CON) CR tablet 80 mEq (80 mEq Oral Given 01/29/18 2029)  potassium chloride 10 mEq in 100 mL  IVPB (0 mEq Intravenous Stopped 01/29/18 2252)  magnesium sulfate IVPB 2 g 50 mL (0 g Intravenous Stopped 01/29/18 2138)     NEW OUTPATIENT MEDICATIONS STARTED DURING THIS VISIT:  New Prescriptions   POTASSIUM CHLORIDE SA (K-DUR,KLOR-CON) 20 MEQ TABLET    Take 2 tablets (40 mEq total) by mouth 2 (two) times daily for 15 days.    Note:  This note was prepared with assistance of Dragon voice recognition software. Occasional wrong-word or sound-a-like substitutions may have occurred due to the inherent limitations of voice recognition software.   Shivaay Stormont, Corene Cornea, MD 01/29/18 5329    Merrily Pew, MD 02/06/18 [phone]
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
## 2018-01-31 ENCOUNTER — Other Ambulatory Visit (HOSPITAL_COMMUNITY): Payer: Medicare Other

## 2018-02-07 ENCOUNTER — Ambulatory Visit (HOSPITAL_COMMUNITY): Payer: Medicare Other | Admitting: Hematology

## 2018-02-17 ENCOUNTER — Telehealth: Payer: Self-pay | Admitting: *Deleted

## 2018-02-17 ENCOUNTER — Telehealth: Payer: Self-pay | Admitting: Cardiovascular Disease

## 2018-02-17 MED ORDER — TORSEMIDE 20 MG PO TABS: ORAL_TABLET | ORAL | Status: DC

## 2018-02-17 NOTE — Telephone Encounter (Signed)
Patient notified and verbalized understanding.  Stated that she sees her pmd on 03/18/2018 - she will call us back after that to schedule next available with Dr. Bronson Ing.

## 2018-02-17 NOTE — Telephone Encounter (Signed)
ERROR

## 2018-02-17 NOTE — Telephone Encounter (Signed)
Sharon Commons, MD  Laurine Blazer, LPN        Have her take 40 mg q am and 20 mg a afternoon between 4-6 pm.   Previous Messages    ----- Message -----  From: Laurine Blazer, LPN  Sent: 1/44/3154  1:21 PM EDT  To: Sharon Commons, MD  Subject: torsemide dosing                 She is currently taking Torsemide 20mg  - 2 tabs daily - they did not change this at her recent hospital visit.

## 2018-02-17 NOTE — Telephone Encounter (Signed)
Returning call.

## 2018-02-25 ENCOUNTER — Telehealth: Payer: Self-pay | Admitting: Cardiovascular Disease

## 2018-02-25 NOTE — Telephone Encounter (Signed)
Patient called stating that she had recent labs. Would like to get results.

## 2018-02-25 NOTE — Telephone Encounter (Signed)
Pt requesting lab results from recent 8/7 ED visit - which explained that was discussed per 8/26 phone note with adjustments to potassium and torsemide - pt then went on to complain about an appt 04/11/16 with Dr Bronson Ing and nursing staff in [place] office. Attempted to get pt to explain to me what she was asking for today - pt then hung up the phone

## 2018-03-18 ENCOUNTER — Ambulatory Visit: Payer: Medicare Other | Admitting: Family

## 2018-03-19 ENCOUNTER — Ambulatory Visit (INDEPENDENT_AMBULATORY_CARE_PROVIDER_SITE_OTHER): Payer: Medicare Other | Admitting: Family

## 2018-03-19 ENCOUNTER — Encounter: Payer: Self-pay | Admitting: Family

## 2018-03-19 VITALS — BP 188/81 | HR 51 | Temp 98.0°F | Ht 60.0 in | Wt 180.0 lb

## 2018-03-19 DIAGNOSIS — F411 Generalized anxiety disorder: Secondary | ICD-10-CM

## 2018-03-19 DIAGNOSIS — I2510 Atherosclerotic heart disease of native coronary artery without angina pectoris: Secondary | ICD-10-CM

## 2018-03-19 DIAGNOSIS — I4819 Other persistent atrial fibrillation: Secondary | ICD-10-CM

## 2018-03-19 DIAGNOSIS — F4310 Post-traumatic stress disorder, unspecified: Secondary | ICD-10-CM | POA: Diagnosis not present

## 2018-03-19 DIAGNOSIS — I10 Essential (primary) hypertension: Secondary | ICD-10-CM | POA: Diagnosis not present

## 2018-03-19 DIAGNOSIS — E785 Hyperlipidemia, unspecified: Secondary | ICD-10-CM

## 2018-03-19 DIAGNOSIS — I481 Persistent atrial fibrillation: Secondary | ICD-10-CM

## 2018-03-19 DIAGNOSIS — D508 Other iron deficiency anemias: Secondary | ICD-10-CM | POA: Diagnosis not present

## 2018-03-19 DIAGNOSIS — I509 Heart failure, unspecified: Secondary | ICD-10-CM

## 2018-03-19 DIAGNOSIS — Z23 Encounter for immunization: Secondary | ICD-10-CM | POA: Diagnosis not present

## 2018-03-19 DIAGNOSIS — J40 Bronchitis, not specified as acute or chronic: Secondary | ICD-10-CM

## 2018-03-19 DIAGNOSIS — F332 Major depressive disorder, recurrent severe without psychotic features: Secondary | ICD-10-CM | POA: Diagnosis not present

## 2018-03-19 DIAGNOSIS — E039 Hypothyroidism, unspecified: Secondary | ICD-10-CM | POA: Diagnosis not present

## 2018-03-19 DIAGNOSIS — Z79899 Other long term (current) drug therapy: Secondary | ICD-10-CM

## 2018-03-19 DIAGNOSIS — F1129 Opioid dependence with unspecified opioid-induced disorder: Secondary | ICD-10-CM | POA: Insufficient documentation

## 2018-03-19 MED ORDER — METOPROLOL TARTRATE 25 MG PO TABS: ORAL_TABLET | ORAL | 1 refills | Status: DC

## 2018-03-19 MED ORDER — ALPRAZOLAM 1 MG PO TABS: 1.0000 mg | ORAL_TABLET | Freq: Three times a day (TID) | ORAL | 5 refills | Status: DC

## 2018-03-19 MED ORDER — PREDNISONE 10 MG (21) PO TBPK
ORAL_TABLET | ORAL | 0 refills | Status: DC
Start: 2018-03-19 — End: 2018-04-21

## 2018-03-19 NOTE — Progress Notes (Signed)
Subjective:    Patient ID: Sharon Compton, female    DOB: [birth year], 77 y.o.   MRN: [id number]  Chief Complaint  Patient presents with  . Medical Management of Chronic Issues    three month recheck   PT presents to the office today for chronic follow up. She is followed by a Cardiologists every 3 months for A Fib, CAD, and CHF. She is followed by Oncologists for anemia.  Congestive Heart Failure  Presents for follow-up visit. Associated symptoms include fatigue. Pertinent negatives include no shortness of breath or unexpected weight change. The symptoms have been stable.  Depression         This is a chronic problem.  The current episode started more than 1 year ago.   The onset quality is gradual.   The problem occurs intermittently.  The problem has been waxing and waning since onset.  Associated symptoms include fatigue, insomnia, irritable, restlessness, decreased interest, myalgias and sad.  Past treatments include nothing.  Past medical history includes thyroid problem and anxiety.   Hypertension  This is a chronic problem. The current episode started more than 1 year ago. The problem has been waxing and waning since onset. The problem is uncontrolled. Associated symptoms include anxiety, malaise/fatigue and peripheral edema. Pertinent negatives include no shortness of breath. Risk factors for coronary artery disease include dyslipidemia, diabetes mellitus, obesity and sedentary lifestyle. The current treatment provides moderate improvement. Hypertensive end-organ damage includes CAD/MI and heart failure. Identifiable causes of hypertension include a thyroid problem.  Thyroid Problem  Presents for follow-up visit. Symptoms include anxiety, depressed mood, fatigue and hoarse voice. The symptoms have been stable. Her past medical history is significant for heart failure.  Arthritis  Presents for follow-up visit. She complains of pain and stiffness. The symptoms have been stable. Affected  locations include the right knee and left knee. Associated symptoms include fatigue.  Anxiety  Presents for follow-up visit. Symptoms include depressed mood, excessive worry, insomnia, irritability, nervous/anxious behavior, panic and restlessness. Patient reports no shortness of breath. Symptoms occur most days. The severity of symptoms is moderate. The quality of sleep is good.    Cough  This is a new problem. The current episode started in the past 7 days. The problem has been rapidly worsening. The problem occurs every few minutes. The cough is productive of sputum. Associated symptoms include chills and myalgias. Pertinent negatives include no shortness of breath.      Review of Systems  Constitutional: Positive for chills, fatigue, irritability and malaise/fatigue. Negative for unexpected weight change.  HENT: Positive for hoarse voice.   Respiratory: Positive for cough. Negative for shortness of breath.   Musculoskeletal: Positive for arthritis, myalgias and stiffness.  Psychiatric/Behavioral: Positive for depression. The patient is nervous/anxious and has insomnia.   All other systems reviewed and are negative.      Objective:   Physical Exam  Constitutional: She is oriented to person, place, and time. She appears well-developed and well-nourished. She is irritable. No distress.  HENT:  Head: Normocephalic and atraumatic.  Right Ear: External ear normal.  Left Ear: External ear normal.  Mouth/Throat: Oropharynx is clear and moist.  Eyes: Pupils are equal, round, and reactive to light.  Neck: Normal range of motion. Neck supple. No thyromegaly present.  Cardiovascular: Normal rate, regular rhythm, normal heart sounds and intact distal pulses.  No murmur heard. Pulmonary/Chest: Effort normal. No respiratory distress. She has no wheezes. She has rhonchi in the left upper field and the  left middle field.  Abdominal: Soft. Bowel sounds are normal. She exhibits no distension.  There is no tenderness.  Musculoskeletal: She exhibits edema (2+ BLE). She exhibits no tenderness.  Pain in bilateral knees with flexion or extensions  Neurological: She is alert and oriented to person, place, and time. She has normal reflexes. No cranial nerve deficit.  Skin: Skin is warm and dry.  Psychiatric: She has a normal mood and affect. Her behavior is normal. Judgment and thought content normal.  Vitals reviewed.     BP (!) 188/81   Pulse (!) 51   Temp 98 F (36.7 C) (Oral)   Ht 5' (1.524 m)   Wt 180 lb (81.6 kg)   BMI 35.15 kg/m      Assessment & Plan:  Sharon Compton comes in today with chief complaint of Medical Management of Chronic Issues (three month recheck)   Diagnosis and orders addressed:  1. Generalized anxiety disorder - CMP14+EGFR - CBC with Differential/Platelet - ALPRAZolam (XANAX) 1 MG tablet; Take 1 tablet (1 mg total) by mouth 3 (three) times daily.  Dispense: 90 tablet; Refill: 5  2. Persistent atrial fibrillation (HCC) - CMP14+EGFR - CBC with Differential/Platelet  3. Posttraumatic stress disorder - CMP14+EGFR - CBC with Differential/Platelet  4. Morbid obesity ([place]) - CMP14+EGFR - CBC with Differential/Platelet  5. Severe episode of recurrent major depressive disorder, without psychotic features ([place]) - CMP14+EGFR - CBC with Differential/Platelet  6. Other iron deficiency anemia - CMP14+EGFR - CBC with Differential/Platelet  7. Hypothyroidism, unspecified type - CMP14+EGFR - CBC with Differential/Platelet - TSH  8. Essential hypertension - CMP14+EGFR - CBC with Differential/Platelet - metoprolol tartrate (LOPRESSOR) 25 MG tablet; TAKE 1/2 (ONE-HALF) TABLET BY MOUTH TWICE DAILY  Dispense: 90 tablet; Refill: 1  9. Congestive heart failure, unspecified HF chronicity, unspecified heart failure type ([place]) - CMP14+EGFR - CBC with Differential/Platelet  10. Coronary artery disease involving native coronary artery of native  heart without angina pectoris - CMP14+EGFR - CBC with Differential/Platelet  11. Hyperlipidemia, unspecified hyperlipidemia type - CMP14+EGFR - CBC with Differential/Platelet - Lipid panel  12. Bronchitis - Take meds as prescribed - Use a cool mist humidifier  -Use saline nose sprays frequently -Force fluids -For any cough or congestion  Use plain Mucinex- regular strength or max strength is fine -For fever or aces or pains- take tylenol or ibuprofen. - predniSONE (STERAPRED UNI-PAK 21 TAB) 10 MG (21) TBPK tablet; Use as directed  Dispense: 21 tablet; Refill: 0  13. Opioid dependence with opioid-induced disorder ([place]) - ToxASSURE Select 13 (MW), Urine  14. Controlled substance agreement signed - ToxASSURE Select 13 (MW), Urine  Labs pending [place] controlled database reviewed today, no red flags  Health Maintenance reviewed-FLU shot given today Diet and exercise encouraged  Follow up plan: 1 month   Evelina Dun, FNP

## 2018-03-19 NOTE — Patient Instructions (Signed)
Heart Failure °Heart failure means your heart has trouble pumping blood. This makes it hard for your body to work well. Heart failure is usually a long-term (chronic) condition. You must take good care of yourself and follow your doctor's treatment plan. °Follow these instructions at home: °· Take your heart medicine as told by your doctor. °? Do not stop taking medicine unless your doctor tells you to. °? Do not skip any dose of medicine. °? Refill your medicines before they run out. °? Take other medicines only as told by your doctor or pharmacist. °· Stay active if told by your doctor. The elderly and people with severe heart failure should talk with a doctor about physical activity. °· Eat heart-healthy foods. Choose foods that are without trans fat and are low in saturated fat, cholesterol, and salt (sodium). This includes fresh or frozen fruits and vegetables, fish, lean meats, fat-free or low-fat dairy foods, whole grains, and high-fiber foods. Lentils and dried peas and beans (legumes) are also good choices. °· Limit salt if told by your doctor. °· Cook in a healthy way. Roast, grill, broil, bake, poach, steam, or stir-fry foods. °· Limit fluids as told by your doctor. °· Weigh yourself every morning. Do this after you pee (urinate) and before you eat breakfast. Write down your weight to give to your doctor. °· Take your blood pressure and write it down if your doctor tells you to. °· Ask your doctor how to check your pulse. Check your pulse as told. °· Lose weight if told by your doctor. °· Stop smoking or chewing tobacco. Do not use gum or patches that help you quit without your doctor's approval. °· Schedule and go to doctor visits as told. °· Nonpregnant women should have no more than 1 drink a day. Men should have no more than 2 drinks a day. Talk to your doctor about drinking alcohol. °· Stop illegal drug use. °· Stay current with shots (immunizations). °· Manage your health conditions as told by your  doctor. °· Learn to manage your stress. °· Rest when you are tired. °· If it is really hot outside: °? Avoid intense activities. °? Use air conditioning or fans, or get in a cooler place. °? Avoid caffeine and alcohol. °? Wear loose-fitting, lightweight, and light-colored clothing. °· If it is really cold outside: °? Avoid intense activities. °? Layer your clothing. °? Wear mittens or gloves, a hat, and a scarf when going outside. °? Avoid alcohol. °· Learn about heart failure and get support as needed. °· Get help to maintain or improve your quality of life and your ability to care for yourself as needed. °Contact a doctor if: °· You gain weight quickly. °· You are more short of breath than usual. °· You cannot do your normal activities. °· You tire easily. °· You cough more than normal, especially with activity. °· You have any or more puffiness (swelling) in areas such as your hands, feet, ankles, or belly (abdomen). °· You cannot sleep because it is hard to breathe. °· You feel like your heart is beating fast (palpitations). °· You get dizzy or light-headed when you stand up. °Get help right away if: °· You have trouble breathing. °· There is a change in mental status, such as becoming less alert or not being able to focus. °· You have chest pain or discomfort. °· You faint. °This information is not intended to replace advice given to you by your health care provider. Make sure you   discuss any questions you have with your health care provider. °Document Released: 03/20/2008 Document Revised: 11/17/2015 Document Reviewed: 07/28/2012 °Elsevier Interactive Patient Education © 2017 [employer] ° °

## 2018-03-20 ENCOUNTER — Other Ambulatory Visit: Payer: Self-pay | Admitting: Family Medicine

## 2018-03-20 ENCOUNTER — Other Ambulatory Visit: Payer: Self-pay | Admitting: Family

## 2018-03-20 DIAGNOSIS — E039 Hypothyroidism, unspecified: Secondary | ICD-10-CM

## 2018-03-20 LAB — CMP14+EGFR
A/G RATIO: 1.5 (ref 1.2–2.2)
ALK PHOS: 97 IU/L (ref 39–117)
ALT: 49 IU/L — AB (ref 0–32)
AST: 46 IU/L — AB (ref 0–40)
Albumin: 4 g/dL (ref 3.5–4.8)
BILIRUBIN TOTAL: 0.3 mg/dL (ref 0.0–1.2)
BUN/Creatinine Ratio: 14 (ref 12–28)
BUN: 19 mg/dL (ref 8–27)
CHLORIDE: 99 mmol/L (ref 96–106)
CO2: 28 mmol/L (ref 20–29)
Calcium: 9 mg/dL (ref 8.7–10.3)
Creatinine, Ser: 1.34 mg/dL — ABNORMAL HIGH (ref 0.57–1.00)
GFR calc Af Amer: 44 mL/min/{1.73_m2} — ABNORMAL LOW (ref >59)
GFR, EST NON AFRICAN AMERICAN: 38 mL/min/{1.73_m2} — AB (ref >59)
GLOBULIN, TOTAL: 2.7 g/dL (ref 1.5–4.5)
Glucose: 112 mg/dL — ABNORMAL HIGH (ref 65–99)
POTASSIUM: 3.9 mmol/L (ref 3.5–5.2)
SODIUM: 145 mmol/L — AB (ref 134–144)
Total Protein: 6.7 g/dL (ref 6.0–8.5)

## 2018-03-20 LAB — LIPID PANEL
CHOLESTEROL TOTAL: 158 mg/dL (ref 100–199)
Chol/HDL Ratio: 3.6 ratio (ref 0.0–4.4)
HDL: 44 mg/dL (ref >39)
LDL Calculated: 89 mg/dL (ref 0–99)
Triglycerides: 126 mg/dL (ref 0–149)
VLDL Cholesterol Cal: 25 mg/dL (ref 5–40)

## 2018-03-20 LAB — CBC WITH DIFFERENTIAL/PLATELET
BASOS ABS: 0.1 10*3/uL (ref 0.0–0.2)
BASOS: 2 %
EOS (ABSOLUTE): 0.1 10*3/uL (ref 0.0–0.4)
Eos: 1 %
Hematocrit: 43.7 % (ref 34.0–46.6)
Hemoglobin: 13.9 g/dL (ref 11.1–15.9)
IMMATURE GRANS (ABS): 0 10*3/uL (ref 0.0–0.1)
IMMATURE GRANULOCYTES: 0 %
LYMPHS: 36 %
Lymphocytes Absolute: 2.3 10*3/uL (ref 0.7–3.1)
MCH: 28.2 pg (ref 26.6–33.0)
MCHC: 31.8 g/dL (ref 31.5–35.7)
MCV: 89 fL (ref 79–97)
MONOS ABS: 0.4 10*3/uL (ref 0.1–0.9)
Monocytes: 6 %
NEUTROS ABS: 3.5 10*3/uL (ref 1.4–7.0)
NEUTROS PCT: 55 %
PLATELETS: 155 10*3/uL (ref 150–450)
RBC: 4.93 x10E6/uL (ref 3.77–5.28)
RDW: 15.4 % (ref 12.3–15.4)
WBC: 6.3 10*3/uL (ref 3.4–10.8)

## 2018-03-20 LAB — TSH: TSH: 4.77 u[IU]/mL — AB (ref 0.450–4.500)

## 2018-03-20 MED ORDER — LEVOTHYROXINE SODIUM 75 MCG PO TABS: 75.0000 ug | ORAL_TABLET | Freq: Every day | ORAL | 11 refills | Status: DC

## 2018-03-21 ENCOUNTER — Telehealth: Payer: Self-pay | Admitting: Family

## 2018-03-21 NOTE — Telephone Encounter (Signed)
Patient agreed to take prednisone .  I explained it was in her chart that she had it January of 2016.

## 2018-03-24 LAB — TOXASSURE SELECT 13 (MW), URINE

## 2018-04-04 ENCOUNTER — Other Ambulatory Visit: Payer: Self-pay | Admitting: Family

## 2018-04-21 ENCOUNTER — Ambulatory Visit: Payer: Medicare Other | Admitting: *Deleted

## 2018-04-21 ENCOUNTER — Encounter: Payer: Self-pay | Admitting: Family

## 2018-04-21 ENCOUNTER — Ambulatory Visit (INDEPENDENT_AMBULATORY_CARE_PROVIDER_SITE_OTHER): Payer: Medicare Other | Admitting: Family

## 2018-04-21 VITALS — BP 187/67 | HR 51 | Temp 98.0°F | Ht 60.0 in | Wt 179.2 lb

## 2018-04-21 DIAGNOSIS — E039 Hypothyroidism, unspecified: Secondary | ICD-10-CM | POA: Diagnosis not present

## 2018-04-21 DIAGNOSIS — F411 Generalized anxiety disorder: Secondary | ICD-10-CM | POA: Diagnosis not present

## 2018-04-21 DIAGNOSIS — F332 Major depressive disorder, recurrent severe without psychotic features: Secondary | ICD-10-CM | POA: Diagnosis not present

## 2018-04-21 DIAGNOSIS — I509 Heart failure, unspecified: Secondary | ICD-10-CM | POA: Diagnosis not present

## 2018-04-21 DIAGNOSIS — I10 Essential (primary) hypertension: Secondary | ICD-10-CM

## 2018-04-21 DIAGNOSIS — I2510 Atherosclerotic heart disease of native coronary artery without angina pectoris: Secondary | ICD-10-CM

## 2018-04-21 DIAGNOSIS — F4310 Post-traumatic stress disorder, unspecified: Secondary | ICD-10-CM | POA: Diagnosis not present

## 2018-04-21 NOTE — Patient Instructions (Signed)
Hypothyroidism Hypothyroidism is a disorder of the thyroid. The thyroid is a large gland that is located in the lower front of the neck. The thyroid releases hormones that control how the body works. With hypothyroidism, the thyroid does not make enough of these hormones. What are the causes? Causes of hypothyroidism may include:  Viral infections.  Pregnancy.  Your own defense system (immune system) attacking your thyroid.  Certain medicines.  Birth defects.  Past radiation treatments to your head or neck.  Past treatment with radioactive iodine.  Past surgical removal of part or all of your thyroid.  Problems with the gland that is located in the center of your brain (pituitary).  What are the signs or symptoms? Signs and symptoms of hypothyroidism may include:  Feeling as though you have no energy (lethargy).  Inability to tolerate cold.  Weight gain that is not explained by a change in diet or exercise habits.  Dry skin.  Coarse hair.  Menstrual irregularity.  Slowing of thought processes.  Constipation.  Sadness or depression.  How is this diagnosed? Your health care provider may diagnose hypothyroidism with blood tests and ultrasound tests. How is this treated? Hypothyroidism is treated with medicine that replaces the hormones that your body does not make. After you begin treatment, it may take several weeks for symptoms to go away. Follow these instructions at home:  Take medicines only as directed by your health care provider.  If you start taking any new medicines, tell your health care provider.  Keep all follow-up visits as directed by your health care provider. This is important. As your condition improves, your dosage needs may change. You will need to have blood tests regularly so that your health care provider can watch your condition. Contact a health care provider if:  Your symptoms do not get better with treatment.  You are taking thyroid  replacement medicine and: ? You sweat excessively. ? You have tremors. ? You feel anxious. ? You lose weight rapidly. ? You cannot tolerate heat. ? You have emotional swings. ? You have diarrhea. ? You feel weak. Get help right away if:  You develop chest pain.  You develop an irregular heartbeat.  You develop a rapid heartbeat. This information is not intended to replace advice given to you by your health care provider. Make sure you discuss any questions you have with your health care provider. Document Released: 06/11/2005 Document Revised: 11/17/2015 Document Reviewed: 10/27/2013 Elsevier Interactive Patient Education  2018 Elsevier Inc.  
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
## 2018-05-26 ENCOUNTER — Encounter: Payer: Self-pay | Admitting: Endocrinology

## 2018-05-27 DIAGNOSIS — R1110 Vomiting, unspecified: Secondary | ICD-10-CM | POA: Diagnosis not present

## 2018-05-27 DIAGNOSIS — H9193 Unspecified hearing loss, bilateral: Secondary | ICD-10-CM | POA: Diagnosis not present

## 2018-06-02 ENCOUNTER — Other Ambulatory Visit: Payer: Self-pay | Admitting: Cardiovascular Disease

## 2018-06-10 ENCOUNTER — Ambulatory Visit (INDEPENDENT_AMBULATORY_CARE_PROVIDER_SITE_OTHER): Payer: Medicare Other | Admitting: *Deleted

## 2018-06-10 ENCOUNTER — Encounter: Payer: Self-pay | Admitting: *Deleted

## 2018-06-10 VITALS — BP 161/83 | HR 51 | Ht 60.0 in | Wt 179.0 lb

## 2018-06-10 DIAGNOSIS — F331 Major depressive disorder, recurrent, moderate: Secondary | ICD-10-CM

## 2018-06-10 DIAGNOSIS — Z0000 Encounter for general adult medical examination without abnormal findings: Secondary | ICD-10-CM | POA: Diagnosis not present

## 2018-06-10 DIAGNOSIS — F4310 Post-traumatic stress disorder, unspecified: Secondary | ICD-10-CM

## 2018-06-10 DIAGNOSIS — E039 Hypothyroidism, unspecified: Secondary | ICD-10-CM

## 2018-06-10 DIAGNOSIS — I4819 Other persistent atrial fibrillation: Secondary | ICD-10-CM

## 2018-06-10 DIAGNOSIS — R5383 Other fatigue: Secondary | ICD-10-CM

## 2018-06-10 DIAGNOSIS — I509 Heart failure, unspecified: Secondary | ICD-10-CM

## 2018-06-10 DIAGNOSIS — F332 Major depressive disorder, recurrent severe without psychotic features: Secondary | ICD-10-CM

## 2018-06-10 DIAGNOSIS — F411 Generalized anxiety disorder: Secondary | ICD-10-CM

## 2018-06-10 NOTE — Patient Instructions (Addendum)
Please use the list of counselors to make an appointment with a counselor to discuss depression issues.  Please move cautiously to avoid falls.   Please review the information given on Advance Directives.  If you complete the paper work please bring a copy to our office to be filed in your medical record.   Thank you for coming in for your Annual Wellness Visit today!!  Hope you have a Merry Christmas!!  Preventive Care 77 Years and Older, Female Preventive care refers to lifestyle choices and visits with your health care provider that can promote health and wellness. What does preventive care include?  A yearly physical exam. This is also called an annual well check.  Dental exams once or twice a year.  Routine eye exams. Ask your health care provider how often you should have your eyes checked.  Personal lifestyle choices, including: ? Daily care of your teeth and gums. ? Regular physical activity. ? Eating a healthy diet. ? Avoiding tobacco and drug use. ? Limiting alcohol use. ? Practicing safe sex. ? Taking low-dose aspirin every day. ? Taking vitamin and mineral supplements as recommended by your health care provider. What happens during an annual well check? The services and screenings done by your health care provider during your annual well check will depend on your age, overall health, lifestyle risk factors, and family history of disease. Counseling Your health care provider may ask you questions about your:  Alcohol use.  Tobacco use.  Drug use.  Emotional well-being.  Home and relationship well-being.  Sexual activity.  Eating habits.  History of falls.  Memory and ability to understand (cognition).  Work and work Statistician.  Reproductive health.  Screening You may have the following tests or measurements:  Height, weight, and BMI.  Blood pressure.  Lipid and cholesterol levels. These may be checked every 5 years, or more frequently if you  are over 63 years old.  Skin check.  Lung cancer screening. You may have this screening every year starting at age 77 if you have a 30-pack-year history of smoking and currently smoke or have quit within the past 15 years.  Fecal occult blood test (FOBT) of the stool. You may have this test every year starting at age 77.  Flexible sigmoidoscopy or colonoscopy. You may have a sigmoidoscopy every 5 years or a colonoscopy every 10 years starting at age 77.  Hepatitis C blood test.  Hepatitis B blood test.  Sexually transmitted disease (STD) testing.  Diabetes screening. This is done by checking your blood sugar (glucose) after you have not eaten for a while (fasting). You may have this done every 1-3 years.  Bone density scan. This is done to screen for osteoporosis. You may have this done starting at age 77.  Mammogram. This may be done every 1-2 years. Talk to your health care provider about how often you should have regular mammograms.  Talk with your health care provider about your test results, treatment options, and if necessary, the need for more tests. Vaccines Your health care provider may recommend certain vaccines, such as:  Influenza vaccine. This is recommended every year.  Tetanus, diphtheria, and acellular pertussis (Tdap, Td) vaccine. You may need a Td booster every 10 years.  Varicella vaccine. You may need this if you have not been vaccinated.  Zoster vaccine. You may need this after age 77.  Measles, mumps, and rubella (MMR) vaccine. You may need at least one dose of MMR if you were born  in 1957 or later. You may also need a second dose.  Pneumococcal 13-valent conjugate (PCV13) vaccine. One dose is recommended after age 77.  Pneumococcal polysaccharide (PPSV23) vaccine. One dose is recommended after age 77.  Meningococcal vaccine. You may need this if you have certain conditions.  Hepatitis A vaccine. You may need this if you have certain conditions or if  you travel or work in places where you may be exposed to hepatitis A.  Hepatitis B vaccine. You may need this if you have certain conditions or if you travel or work in places where you may be exposed to hepatitis B.  Haemophilus influenzae type b (Hib) vaccine. You may need this if you have certain conditions.  Talk to your health care provider about which screenings and vaccines you need and how often you need them. This information is not intended to replace advice given to you by your health care provider. Make sure you discuss any questions you have with your health care provider. Document Released: 07/08/2015 Document Revised: 02/29/2016 Document Reviewed: 04/12/2015 Elsevier Interactive Patient Education  2018 [place] in the Home Falls can cause injuries. They can happen to people of all ages. There are many things you can do to make your home safe and to help prevent falls. What can I do on the outside of my home?  Regularly fix the edges of walkways and driveways and fix any cracks.  Remove anything that might make you trip as you walk through a door, such as a raised step or threshold.  Trim any bushes or trees on the path to your home.  Use bright outdoor lighting.  Clear any walking paths of anything that might make someone trip, such as rocks or tools.  Regularly check to see if handrails are loose or broken. Make sure that both sides of any steps have handrails.  Any raised decks and porches should have guardrails on the edges.  Have any leaves, snow, or ice cleared regularly.  Use sand or salt on walking paths during winter.  Clean up any spills in your garage right away. This includes oil or grease spills. What can I do in the bathroom?  Use night lights.  Install grab bars by the toilet and in the tub and shower. Do not use towel bars as grab bars.  Use non-skid mats or decals in the tub or shower.  If you need to sit down in the  shower, use a plastic, non-slip stool.  Keep the floor dry. Clean up any water that spills on the floor as soon as it happens.  Remove soap buildup in the tub or shower regularly.  Attach bath mats securely with double-sided non-slip rug tape.  Do not have throw rugs and other things on the floor that can make you trip. What can I do in the bedroom?  Use night lights.  Make sure that you have a light by your bed that is easy to reach.  Do not use any sheets or blankets that are too big for your bed. They should not hang down onto the floor.  Have a firm chair that has side arms. You can use this for support while you get dressed.  Do not have throw rugs and other things on the floor that can make you trip. What can I do in the kitchen?  Clean up any spills right away.  Avoid walking on wet floors.  Keep items that you use a lot  in easy-to-reach places.  If you need to reach something above you, use a strong step stool that has a grab bar.  Keep electrical cords out of the way.  Do not use floor polish or wax that makes floors slippery. If you must use wax, use non-skid floor wax.  Do not have throw rugs and other things on the floor that can make you trip. What can I do with my stairs?  Do not leave any items on the stairs.  Make sure that there are handrails on both sides of the stairs and use them. Fix handrails that are broken or loose. Make sure that handrails are as long as the stairways.  Check any carpeting to make sure that it is firmly attached to the stairs. Fix any carpet that is loose or worn.  Avoid having throw rugs at the top or bottom of the stairs. If you do have throw rugs, attach them to the floor with carpet tape.  Make sure that you have a light switch at the top of the stairs and the bottom of the stairs. If you do not have them, ask someone to add them for you. What else can I do to help prevent falls?  Wear shoes that: ? Do not have high  heels. ? Have rubber bottoms. ? Are comfortable and fit you well. ? Are closed at the toe. Do not wear sandals.  If you use a stepladder: ? Make sure that it is fully opened. Do not climb a closed stepladder. ? Make sure that both sides of the stepladder are locked into place. ? Ask someone to hold it for you, if possible.  Clearly mark and make sure that you can see: ? Any grab bars or handrails. ? First and last steps. ? Where the edge of each step is.  Use tools that help you move around (mobility aids) if they are needed. These include: ? Canes. ? Walkers. ? Scooters. ? Crutches.  Turn on the lights when you go into a dark area. Replace any light bulbs as soon as they burn out.  Set up your furniture so you have a clear path. Avoid moving your furniture around.  If any of your floors are uneven, fix them.  If there are any pets around you, be aware of where they are.  Review your medicines with your doctor. Some medicines can make you feel dizzy. This can increase your chance of falling. Ask your doctor what other things that you can do to help prevent falls. This information is not intended to replace advice given to you by your health care provider. Make sure you discuss any questions you have with your health care provider. Document Released: 04/07/2009 Document Revised: 11/17/2015 Document Reviewed: 07/16/2014 Elsevier Interactive Patient Education  [employer].
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
## 2018-06-20 ENCOUNTER — Ambulatory Visit: Payer: Self-pay | Admitting: Family

## 2018-06-30 ENCOUNTER — Ambulatory Visit (INDEPENDENT_AMBULATORY_CARE_PROVIDER_SITE_OTHER): Payer: Medicare Other | Admitting: Pediatrics

## 2018-06-30 ENCOUNTER — Encounter: Payer: Self-pay | Admitting: Pediatrics

## 2018-06-30 VITALS — BP 139/88 | HR 90 | Temp 97.9°F | Ht 60.0 in | Wt 180.4 lb

## 2018-06-30 DIAGNOSIS — R5383 Other fatigue: Secondary | ICD-10-CM

## 2018-06-30 DIAGNOSIS — E785 Hyperlipidemia, unspecified: Secondary | ICD-10-CM

## 2018-06-30 DIAGNOSIS — Z955 Presence of coronary angioplasty implant and graft: Secondary | ICD-10-CM

## 2018-06-30 DIAGNOSIS — I509 Heart failure, unspecified: Secondary | ICD-10-CM

## 2018-06-30 DIAGNOSIS — E039 Hypothyroidism, unspecified: Secondary | ICD-10-CM

## 2018-06-30 DIAGNOSIS — I10 Essential (primary) hypertension: Secondary | ICD-10-CM

## 2018-06-30 DIAGNOSIS — D508 Other iron deficiency anemias: Secondary | ICD-10-CM | POA: Diagnosis not present

## 2018-06-30 MED ORDER — LOSARTAN POTASSIUM 50 MG PO TABS: 50.0000 mg | ORAL_TABLET | Freq: Every day | ORAL | 1 refills | Status: DC

## 2018-06-30 MED ORDER — TORSEMIDE 20 MG PO TABS: ORAL_TABLET | ORAL | 1 refills | Status: DC

## 2018-06-30 MED ORDER — ROSUVASTATIN CALCIUM 20 MG PO TABS: 20.0000 mg | ORAL_TABLET | Freq: Every day | ORAL | 1 refills | Status: DC

## 2018-06-30 MED ORDER — NITROGLYCERIN 0.4 MG SL SUBL: SUBLINGUAL_TABLET | SUBLINGUAL | 2 refills | Status: DC

## 2018-06-30 NOTE — Patient Instructions (Signed)
Check blood pressures once a week, come back to see me in 4 weeks  Take torsemide every morning

## 2018-06-30 NOTE — Progress Notes (Signed)
Subjective:   Patient ID: Sharon Compton, female    DOB: 12-02-1940, 78 y.o.   MRN: [id number] CC: Fatigue and Emesis  HPI: Sharon Compton is a 78 y.o. female   CHF: Has noticed swelling off and on her feet.  Prescribed torsemide 40 mg in a.m., 20 mg in the afternoon.  She says she is taking 1 tablet about twice a week right now. Last echo 2016, moderate LVH, EF 55 to 60%, mild left atrial enlargement, trivial pericardial effusion, left pleural effusion.  No effusion present chest x-ray 01/2018.  History of hypothyroidism: Recently recommended to start thyroid replacement medicine.  She says every time she takes it she is vomiting for the rest of the day every time she eats.  She stopped it about 6 weeks ago.  Hyperlipidemia: Has been prescribed rosuvastatin.  Not taking daily.  Hypertension: Taking diltiazem and metoprolol regularly.  Continues to have intermittent fatigue.  Relevant past medical, surgical, family and social history reviewed. Allergies and medications reviewed and updated. Social History   Tobacco Use  Smoking Status Former Smoker  . Packs/day: 1.00  . Years: 15.00  . Pack years: 15.00  . Types: Cigarettes  . Last attempt to quit: 06/26/1995  . Years since quitting: 23.0  Smokeless Tobacco Never Used   ROS: Per HPI   Objective:    BP 139/88   Pulse 90   Temp 97.9 F (36.6 C) (Oral)   Ht 5' (1.524 m)   Wt 180 lb 6.4 oz (81.8 kg)   BMI 35.23 kg/m   Wt Readings from Last 3 Encounters:  06/30/18 180 lb 6.4 oz (81.8 kg)  06/10/18 179 lb (81.2 kg)  04/21/18 179 lb 3.2 oz (81.3 kg)    Gen: NAD, alert, cooperative with exam, NCAT EYES: EOMI, no conjunctival injection, or no icterus ENT:  TMs pearly gray b/l, OP without erythema LYMPH: no cervical LAD CV: NRRR, normal S1/S2, no murmur, distal pulses 2+ b/l Resp: CTABL, no wheezes, normal WOB Abd: +BS, soft, NTND. no guarding or organomegaly Ext: No edema, warm Neuro: Alert and oriented, strength equal b/l  UE and LE, coordination grossly normal MSK: normal muscle bulk  Assessment & Plan:  Sharon Compton was seen today for fatigue and emesis.  Diagnoses and all orders for this visit:  Hypothyroidism, unspecified type Not taking levothyroxine x6 weeks.  Will repeat labs. -     TSH -     T4, Free  Congestive heart failure, unspecified HF chronicity, unspecified heart failure type ([place]) Restart torsemide daily to help with fluid retention.  Follow-up in 4 weeks for repeat labs.  Overdue for follow-up with cardiology. -     torsemide (DEMADEX) 20 MG tablet; Take 12m by mouth every morning  Other iron deficiency anemia Has required iron transfusions in the past. -     CBC with Differential/Platelet -     Ferritin  Fatigue, unspecified type May need repeat echo if no other cause found  Essential hypertension Stable, continue current medicine -     CMP14+EGFR -     losartan (COZAAR) 50 MG tablet; Take 1 tablet (50 mg total) by mouth daily.  Hx of right coronary artery stent placement Continue aspirin, statin, beta-blocker -     nitroGLYCERIN (NITROSTAT) 0.4 MG SL tablet; DISSOLVE ONE TABLET UNDER THE TONGUE EVERY 5 MINUTES AS NEEDED FOR CHEST PAIN.  DO NOT EXCEED A TOTAL OF 3 DOSES IN 15 MINUTES  Hyperlipidemia, unspecified hyperlipidemia type Stable, take below daily. -  rosuvastatin (CRESTOR) 20 MG tablet; Take 1 tablet (20 mg total) by mouth at bedtime.   Follow up plan: Return in about 4 weeks (around 07/28/2018). Assunta Found, MD [place]

## 2018-07-01 ENCOUNTER — Encounter: Payer: Self-pay | Admitting: Pediatrics

## 2018-07-01 LAB — CBC WITH DIFFERENTIAL/PLATELET
Basophils Absolute: 0.1 10*3/uL (ref 0.0–0.2)
Basos: 2 %
EOS (ABSOLUTE): 0 10*3/uL (ref 0.0–0.4)
EOS: 1 %
HEMATOCRIT: 43 % (ref 34.0–46.6)
Hemoglobin: 14.3 g/dL (ref 11.1–15.9)
IMMATURE GRANULOCYTES: 1 %
Immature Grans (Abs): 0 10*3/uL (ref 0.0–0.1)
Lymphocytes Absolute: 1.5 10*3/uL (ref 0.7–3.1)
Lymphs: 36 %
MCH: 28.9 pg (ref 26.6–33.0)
MCHC: 33.3 g/dL (ref 31.5–35.7)
MCV: 87 fL (ref 79–97)
MONOS ABS: 0.3 10*3/uL (ref 0.1–0.9)
Monocytes: 7 %
NEUTROS PCT: 53 %
Neutrophils Absolute: 2.3 10*3/uL (ref 1.4–7.0)
Platelets: 144 10*3/uL — ABNORMAL LOW (ref 150–450)
RBC: 4.95 x10E6/uL (ref 3.77–5.28)
RDW: 15.4 % (ref 11.7–15.4)
WBC: 4.2 10*3/uL (ref 3.4–10.8)

## 2018-07-01 LAB — CMP14+EGFR
ALT: 25 IU/L (ref 0–32)
AST: 31 IU/L (ref 0–40)
Albumin/Globulin Ratio: 1.6 (ref 1.2–2.2)
Albumin: 3.9 g/dL (ref 3.5–4.8)
Alkaline Phosphatase: 90 IU/L (ref 39–117)
BUN/Creatinine Ratio: 11 — ABNORMAL LOW (ref 12–28)
BUN: 14 mg/dL (ref 8–27)
Bilirubin Total: 0.5 mg/dL (ref 0.0–1.2)
CALCIUM: 8.9 mg/dL (ref 8.7–10.3)
CO2: 25 mmol/L (ref 20–29)
CREATININE: 1.23 mg/dL — AB (ref 0.57–1.00)
Chloride: 103 mmol/L (ref 96–106)
GFR calc Af Amer: 49 mL/min/{1.73_m2} — ABNORMAL LOW (ref >59)
GFR, EST NON AFRICAN AMERICAN: 42 mL/min/{1.73_m2} — AB (ref >59)
GLUCOSE: 107 mg/dL — AB (ref 65–99)
Globulin, Total: 2.5 g/dL (ref 1.5–4.5)
Potassium: 3.7 mmol/L (ref 3.5–5.2)
Sodium: 144 mmol/L (ref 134–144)
Total Protein: 6.4 g/dL (ref 6.0–8.5)

## 2018-07-01 LAB — TSH: TSH: 7.06 u[IU]/mL — ABNORMAL HIGH (ref 0.450–4.500)

## 2018-07-01 LAB — FERRITIN: FERRITIN: 110 ng/mL (ref 15–150)

## 2018-07-01 LAB — T4, FREE: FREE T4: 1.14 ng/dL (ref 0.82–1.77)

## 2018-07-08 ENCOUNTER — Other Ambulatory Visit: Payer: Self-pay | Admitting: Family

## 2018-07-08 DIAGNOSIS — I10 Essential (primary) hypertension: Secondary | ICD-10-CM

## 2018-07-09 ENCOUNTER — Ambulatory Visit: Payer: Self-pay | Admitting: Licensed Clinical Social Worker

## 2018-07-09 ENCOUNTER — Telehealth: Payer: Self-pay | Admitting: Family

## 2018-07-09 DIAGNOSIS — F411 Generalized anxiety disorder: Secondary | ICD-10-CM

## 2018-07-09 DIAGNOSIS — F4310 Post-traumatic stress disorder, unspecified: Secondary | ICD-10-CM

## 2018-07-09 DIAGNOSIS — I2510 Atherosclerotic heart disease of native coronary artery without angina pectoris: Secondary | ICD-10-CM

## 2018-07-09 DIAGNOSIS — R5383 Other fatigue: Secondary | ICD-10-CM

## 2018-07-09 NOTE — Telephone Encounter (Signed)
Thanks Mitzi for this update information. Scott Latorsha Curling [facility]

## 2018-07-09 NOTE — Chronic Care Management (AMB) (Addendum)
  Chronic Care Management   Note  07/09/2018 Name: Sharon Compton MRN: [id number] DOB: [birth year]   LCSW called client phone number on 07/09/2018.  LCSW was not able to speak with client via phone but left a voice message requesting client return call to LCSW at [phone] to discuss CCM program services. Client returned call to [facility] and left a message with Mitzi at the front desk stating she does not wish to participate in CCM services and does not feel she is in need of LCSW/psychosocial support at this time.   Plan:  I will notify provider of client declination of CCM services. I am happy to provide support to client at any time in the future.   Norva Riffle.Shadeed Colberg MSW, LCSW Licensed Clinical Social Worker [facility] Care Management [phone]  I have reviewed and agree with the above  documentation.   Evelina Dun, FNP

## 2018-07-23 ENCOUNTER — Ambulatory Visit (HOSPITAL_COMMUNITY): Payer: Medicare Other | Admitting: Hematology
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
## 2018-07-29 ENCOUNTER — Ambulatory Visit: Payer: Medicare Other | Admitting: Family

## 2018-08-07 ENCOUNTER — Ambulatory Visit: Payer: Medicare Other | Admitting: Family

## 2018-08-15 ENCOUNTER — Ambulatory Visit: Payer: Medicare Other | Admitting: Family

## 2018-08-20 ENCOUNTER — Telehealth: Payer: Self-pay | Admitting: Family

## 2018-09-04 ENCOUNTER — Ambulatory Visit (INDEPENDENT_AMBULATORY_CARE_PROVIDER_SITE_OTHER): Payer: Medicare Other | Admitting: Family

## 2018-09-04 ENCOUNTER — Encounter: Payer: Self-pay | Admitting: Family

## 2018-09-04 ENCOUNTER — Telehealth: Payer: Self-pay | Admitting: Family

## 2018-09-04 ENCOUNTER — Other Ambulatory Visit: Payer: Self-pay

## 2018-09-04 VITALS — BP 149/76 | HR 54 | Temp 97.9°F | Ht 60.0 in | Wt 180.4 lb

## 2018-09-04 DIAGNOSIS — I10 Essential (primary) hypertension: Secondary | ICD-10-CM | POA: Diagnosis not present

## 2018-09-04 DIAGNOSIS — F332 Major depressive disorder, recurrent severe without psychotic features: Secondary | ICD-10-CM | POA: Diagnosis not present

## 2018-09-04 DIAGNOSIS — I25700 Atherosclerosis of coronary artery bypass graft(s), unspecified, with unstable angina pectoris: Secondary | ICD-10-CM | POA: Diagnosis not present

## 2018-09-04 DIAGNOSIS — D508 Other iron deficiency anemias: Secondary | ICD-10-CM

## 2018-09-04 DIAGNOSIS — F411 Generalized anxiety disorder: Secondary | ICD-10-CM

## 2018-09-04 DIAGNOSIS — I509 Heart failure, unspecified: Secondary | ICD-10-CM | POA: Diagnosis not present

## 2018-09-04 DIAGNOSIS — I4891 Unspecified atrial fibrillation: Secondary | ICD-10-CM

## 2018-09-04 DIAGNOSIS — E039 Hypothyroidism, unspecified: Secondary | ICD-10-CM | POA: Diagnosis not present

## 2018-09-04 DIAGNOSIS — Z79899 Other long term (current) drug therapy: Secondary | ICD-10-CM | POA: Diagnosis not present

## 2018-09-04 MED ORDER — ALPRAZOLAM 1 MG PO TABS: 1.0000 mg | ORAL_TABLET | Freq: Three times a day (TID) | ORAL | 5 refills | Status: DC

## 2018-09-04 NOTE — Patient Instructions (Signed)
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
## 2018-09-05 LAB — THYROID PANEL WITH TSH
FREE THYROXINE INDEX: 1.9 (ref 1.2–4.9)
T3 Uptake Ratio: 28 % (ref 24–39)
T4, Total: 6.8 ug/dL (ref 4.5–12.0)
TSH: 7.52 u[IU]/mL — ABNORMAL HIGH (ref 0.450–4.500)

## 2018-09-05 LAB — CMP14+EGFR
ALBUMIN: 4.1 g/dL (ref 3.7–4.7)
ALT: 50 IU/L — ABNORMAL HIGH (ref 0–32)
AST: 44 IU/L — ABNORMAL HIGH (ref 0–40)
Albumin/Globulin Ratio: 1.6 (ref 1.2–2.2)
Alkaline Phosphatase: 107 IU/L (ref 39–117)
BILIRUBIN TOTAL: 0.4 mg/dL (ref 0.0–1.2)
BUN / CREAT RATIO: 16 (ref 12–28)
BUN: 22 mg/dL (ref 8–27)
CALCIUM: 8.6 mg/dL — AB (ref 8.7–10.3)
CHLORIDE: 102 mmol/L (ref 96–106)
CO2: 26 mmol/L (ref 20–29)
CREATININE: 1.38 mg/dL — AB (ref 0.57–1.00)
GFR, EST AFRICAN AMERICAN: 43 mL/min/{1.73_m2} — AB (ref >59)
GFR, EST NON AFRICAN AMERICAN: 37 mL/min/{1.73_m2} — AB (ref >59)
GLUCOSE: 102 mg/dL — AB (ref 65–99)
Globulin, Total: 2.6 g/dL (ref 1.5–4.5)
Potassium: 4.3 mmol/L (ref 3.5–5.2)
Sodium: 144 mmol/L (ref 134–144)
TOTAL PROTEIN: 6.7 g/dL (ref 6.0–8.5)

## 2018-09-08 LAB — TOXASSURE SELECT 13 (MW), URINE

## 2018-09-23 ENCOUNTER — Other Ambulatory Visit: Payer: Self-pay | Admitting: Family

## 2018-10-02 ENCOUNTER — Telehealth: Payer: Self-pay | Admitting: Cardiovascular Disease

## 2018-10-02 ENCOUNTER — Ambulatory Visit: Payer: Medicare Other | Admitting: Cardiovascular Disease

## 2018-10-02 NOTE — Telephone Encounter (Signed)
° ° °  Virtual Visit Pre-Appointment Phone Call  Steps For Call:  1. Confirm consent - "In the setting of the current Covid19 crisis, you are scheduled for a (phone or video) visit with your provider on (date) at (time).  Just as we do with many in-office visits, in order for you to participate in this visit, we must obtain consent.  If you'd like, I can send this to your mychart (if signed up) or email for you to review.  Otherwise, I can obtain your verbal consent now.  All virtual visits are billed to your insurance company just like a normal visit would be.  By agreeing to a virtual visit, we'd like you to understand that the technology does not allow for your provider to perform an examination, and thus may limit your provider's ability to fully assess your condition.  Finally, though the technology is pretty good, we cannot assure that it will always work on either your or our end, and in the setting of a video visit, we may have to convert it to a phone-only visit.  In either situation, we cannot ensure that we have a secure connection.  Are you willing to proceed?"  2. Give patient instructions for WebEx download to smartphone as below if video visit  3. Advise patient to be prepared with any vital sign or heart rhythm information, their current medicines, and a piece of paper and pen handy for any instructions they may receive the day of their visit  4. Inform patient they will receive a phone call 15 minutes prior to their appointment time (may be from unknown caller ID) so they should be prepared to answer  5. Confirm that appointment type is correct in Epic appointment notes (video vs telephone)    TELEPHONE CALL NOTE  JACKLYN BRANAN has been deemed a candidate for a follow-up tele-health visit to limit community exposure during the Covid-19 pandemic. I spoke with the patient via phone to ensure availability of phone/video source, confirm preferred email & phone number, and discuss  instructions and expectations.  I reminded ROCIO ROAM to be prepared with any vital sign and/or heart rhythm information that could potentially be obtained via home monitoring, at the time of her visit. I reminded FIZZA SCALES to expect a phone call at the time of her visit if her visit.  Did the patient verbally acknowledge consent to treatment? Yes Orinda Kenner 10/02/2018 2:28 PM

## 2018-10-03 ENCOUNTER — Encounter: Payer: Self-pay | Admitting: Cardiovascular Disease

## 2018-10-03 ENCOUNTER — Telehealth (INDEPENDENT_AMBULATORY_CARE_PROVIDER_SITE_OTHER): Payer: Medicare Other | Admitting: Cardiovascular Disease

## 2018-10-03 VITALS — Ht 60.0 in | Wt 180.0 lb

## 2018-10-03 DIAGNOSIS — I4819 Other persistent atrial fibrillation: Secondary | ICD-10-CM | POA: Diagnosis not present

## 2018-10-03 DIAGNOSIS — E785 Hyperlipidemia, unspecified: Secondary | ICD-10-CM | POA: Diagnosis not present

## 2018-10-03 DIAGNOSIS — I5032 Chronic diastolic (congestive) heart failure: Secondary | ICD-10-CM

## 2018-10-03 DIAGNOSIS — I25118 Atherosclerotic heart disease of native coronary artery with other forms of angina pectoris: Secondary | ICD-10-CM

## 2018-10-03 DIAGNOSIS — Z87891 Personal history of nicotine dependence: Secondary | ICD-10-CM

## 2018-10-03 DIAGNOSIS — Z955 Presence of coronary angioplasty implant and graft: Secondary | ICD-10-CM | POA: Diagnosis not present

## 2018-10-03 DIAGNOSIS — Z7982 Long term (current) use of aspirin: Secondary | ICD-10-CM

## 2018-10-03 DIAGNOSIS — Z79899 Other long term (current) drug therapy: Secondary | ICD-10-CM

## 2018-10-03 DIAGNOSIS — I10 Essential (primary) hypertension: Secondary | ICD-10-CM

## 2018-10-03 MED ORDER — ROSUVASTATIN CALCIUM 40 MG PO TABS: 40.0000 mg | ORAL_TABLET | Freq: Every day | ORAL | 6 refills | Status: DC

## 2018-10-03 NOTE — Addendum Note (Signed)
Addended by: Laurine Blazer on: 10/03/2018 08:50 AM   Modules accepted: Orders

## 2018-10-03 NOTE — Patient Instructions (Addendum)
Medication Instructions:   Increase your Crestor to 40mg  daily.  Your LDL (bad cholesterol) was 89 in September.  Per Dr. Bronson Ing, he would like to see that under 48.  A new prescription will be sent to the pharmacy for you.    Continue all other current medications.  Labwork: none  Testing/Procedures: none  Follow-Up: Your physician wants you to follow up in: 6 months.  You will receive a reminder letter in the mail one-two months in advance.  If you don't receive a letter, please call our office to schedule the follow up appointment   Any Other Special Instructions Will Be Listed Below (If Applicable).  If you need a refill on your cardiac medications before your next appointment, please call your pharmacy.

## 2018-10-03 NOTE — Progress Notes (Signed)
Virtual Visit via Telephone Note   This visit type was conducted due to national recommendations for restrictions regarding the COVID-19 Pandemic (e.g. social distancing) in an effort to limit this patient's exposure and mitigate transmission in our community.  Due to her co-morbid illnesses, this patient is at least at moderate risk for complications without adequate follow up.  This format is felt to be most appropriate for this patient at this time.  The patient did not have access to video technology/had technical difficulties with video requiring transitioning to audio format only (telephone).  All issues noted in this document were discussed and addressed.  No physical exam could be performed with this format.  Please refer to the patient's chart for her  consent to telehealth for [facility].   Evaluation Performed:  Follow-up visit  Date:  10/03/2018   ID:  Sharon Compton 1941-05-31, MRN [id number]  Patient Location: Home  Provider Location: Home  PCP:  Sharion Balloon, FNP  Cardiologist:  Kate Sable, MD  Electrophysiologist:  None   Chief Complaint:  CAD  History of Present Illness:    Sharon Compton is a 78 y.o. female who presents via audio/video conferencing for a telehealth visit today.    PMH includes coronary artery disease, atrial fibrillation, hypertension, and chronic diastolic heart failure.  She has apriorhistory of medication noncompliance.  She refused the stress test I ordered at a prior visit with me on 03/15/17.She tells me she was scared of the injection.  Coronary angiography on8/1/16showed mid LAD stenosis 50%,distal RCA lesion of 20%previously treatedwith a stent, RPDA lesion of 70%.  Echocardiogram on 03/10/15 was a technically difficult study but demonstrated normal left ventricular systolic function, LVEF 76-22%, moderate LVH, mild left atrial dilatation, and a trivial pericardial effusion.  She was up all night urinating  due to torsemide.Denies chest pain and seldom has palpitations, aggravated by being upset.  She told me she did take nitro but not for chest pain after receiving some bad news. She does c/o feet swelling which is chronic. She doesn't have a BP cuff.  The patient does not have symptoms concerning for COVID-19 infection (fever, chills, cough, or new shortness of breath).    Past Medical History:  Diagnosis Date  . A-fib [facility])    Early 2013, had TEE/DCCV at [facility]; pt reports DCCV 2014 at [facility] as well.  . Anxiety and depression   . Asthma   . 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 CAD (coronary artery disease) 02/2013   2.75 x 32 mm Rebel bare metal stent in the distal RCA.   [name] COPD (chronic obstructive pulmonary disease) ([place])   . Diverticular disease 10/01/2011  . Fibromyalgia   . GERD (gastroesophageal reflux disease)   . GI bleed    Recurrent, hx cecal AVMs, ablated 07/2011; hx PUD also  . Hiatal hernia   . History of pneumonia   . Hypertension   . Lymphedema   . MI, acute, non ST segment elevation ([place]) 10/02/2011  . Peptic ulcer disease   . Peripheral edema    Past Surgical History:  Procedure Laterality Date  . abd tumor removed     states was 10 lbs, benign  . ABDOMINAL EXPLORATION SURGERY    . ABDOMINAL HYSTERECTOMY  age 36   nonmalignant reason  . bladder stent    . CARDIAC CATHETERIZATION N/A 01/24/2015   Procedure: Left Heart Cath and Coronary Angiography;  Surgeon: Troy Sine, MD;  Location:  [place] INVASIVE CV LAB;  Service: Cardiovascular;  Laterality: N/A;  . CARDIOVASCULAR STRESS TEST  09/2011   equivocal result, most likely low risk; pt refused the recommended cardiac cath to follow this up.  . CHOLECYSTECTOMY    . COLONOSCOPY  08/25/2011   Procedure: COLONOSCOPY;  Surgeon: Daneil Dolin, MD;  [facility];  Service: Endoscopy;  Laterality: N/A;  . COLONOSCOPY  10/03/2011   Procedure: COLONOSCOPY;  Surgeon: Daneil Dolin, MD;  Location: [facility]  ENDO SUITE;  Service: Endoscopy;  Laterality: N/A;  NEEDS PHENERGAN 25 MG IV ON CALL  . COLONOSCOPY  10/04/2011   Procedure: COLONOSCOPY;  Surgeon: Daneil Dolin, MD;  [facility] ENDO SUITE;  Service: Endoscopy;  Laterality: N/A;  Phenergan 12.5 mg ON CALL  . CORONARY ANGIOPLASTY  02/2013  . ESOPHAGOGASTRODUODENOSCOPY  08/24/2011   Procedure: ESOPHAGOGASTRODUODENOSCOPY (EGD);  Surgeon: Daneil Dolin, MD;  [facility];  Service: Endoscopy;  Laterality: N/A;  give phenergan 12.5mg  iv 30 mins prior to procedure  . KNEE SURGERY    . LEFT AND RIGHT HEART CATHETERIZATION WITH CORONARY ANGIOGRAM N/A 03/03/2013   Procedure: LEFT AND RIGHT HEART CATHETERIZATION WITH CORONARY ANGIOGRAM;  Surgeon: Burnell Blanks, MD;  Location: [facility] CATH LAB;  Service: Cardiovascular;  Laterality: N/A;  . PERCUTANEOUS CORONARY STENT INTERVENTION (PCI-S)  02/2013   2.75 x 32 mm Rebel bare metal stent in the distal RCA.   [name] TRANSTHORACIC ECHOCARDIOGRAM  09/2011   EF 60-65%, septal hypokinesia     Current Meds  Medication Sig  . ALPRAZolam (XANAX) 1 MG tablet Take 1 tablet (1 mg total) by mouth 3 (three) times daily.  [name] amiodarone (PACERONE) 200 MG tablet Take 1 tablet by mouth once daily  . aspirin 81 MG tablet Take 81 mg by mouth daily.  . Black Cohosh-SoyIsoflav-Magnol (ESTROVEN MENOPAUSE RELIEF) CAPS Take 1 capsule by mouth every morning.  [name] CARTIA XT 240 MG 24 hr capsule TAKE 1 CAPSULE BY MOUTH ONCE DAILY  . losartan (COZAAR) 50 MG tablet Take 1 tablet (50 mg total) by mouth daily.  . metoprolol tartrate (LOPRESSOR) 25 MG tablet TAKE 1/2 (ONE-HALF) TABLET BY MOUTH TWICE DAILY  . nitroGLYCERIN (NITROSTAT) 0.4 MG SL tablet DISSOLVE ONE TABLET UNDER THE TONGUE EVERY 5 MINUTES AS NEEDED FOR CHEST PAIN.  DO NOT EXCEED A TOTAL OF 3 DOSES IN 15 MINUTES  . potassium chloride SA (K-DUR,KLOR-CON) 20 MEQ tablet Take 2 tablets (40 mEq total) by mouth 2 (two) times daily for 15 days.  . rosuvastatin (CRESTOR) 20 MG  tablet Take 1 tablet (20 mg total) by mouth at bedtime.  . torsemide (DEMADEX) 20 MG tablet Take 20mg  by mouth every morning     Allergies:   Iohexol; Dye fdc red [red dye]; Sulfa antibiotics; and Codeine   Social History   Tobacco Use  . Smoking status: Former Smoker    Packs/day: 1.00    Years: 15.00    Pack years: 15.00    Types: Cigarettes    Last attempt to quit: 06/26/1995    Years since quitting: 23.2  . Smokeless tobacco: Never Used  Substance Use Topics  . Alcohol use: No    Alcohol/week: 0.0 standard drinks  . Drug use: No     Family Hx: The patient's family history includes Cancer in her father; Coronary artery disease in her mother; Diabetes in her mother; Heart failure in her mother; Hypertension in her mother. There is no history of Colon cancer.  ROS:   Please see the history of present illness.     All other systems reviewed and are negative.   Prior CV studies:   The following studies were reviewed today:  Reviewed above  Labs/Other Tests and Data Reviewed:    EKG:  NA  Recent Labs: 01/29/2018: B Natriuretic Peptide 94.0; Magnesium 2.2 06/30/2018: Hemoglobin 14.3; Platelets 144 09/04/2018: ALT 50; BUN 22; Creatinine, Ser 1.38; Potassium 4.3; Sodium 144; TSH 7.520   Recent Lipid Panel Lab Results  Component Value Date/Time   CHOL 158 03/19/2018 12:28 PM   TRIG 126 03/19/2018 12:28 PM   TRIG 159 (H) 07/29/2014 04:29 PM   HDL 44 03/19/2018 12:28 PM   HDL 44 07/29/2014 04:29 PM   CHOLHDL 3.6 03/19/2018 12:28 PM   CHOLHDL 4.7 11/03/2013 06:25 PM   LDLCALC 89 03/19/2018 12:28 PM   LDLCALC 215 (H) 03/19/2014 03:03 PM    Wt Readings from Last 3 Encounters:  10/03/18 180 lb (81.6 kg)  09/04/18 180 lb 6.4 oz (81.8 kg)  06/30/18 180 lb 6.4 oz (81.8 kg)     Objective:    Vital Signs:  Ht 5' (1.524 m)   Wt 180 lb (81.6 kg)   BMI 35.15 kg/m    Phone visit  ASSESSMENT & PLAN:    1.  Chronic diastolic heart failure: On torsemide 20 mg daily with  supplemental KCl. Symptomatically stable. No changes.  2.  Persistent atrial fibrillation/flutter:Symptomatically stable. Not a candidate for anticoagulation for reasons mentioned above. Continue amiodarone and metoprolol.  She also takes long-acting diltiazem 240 mg daily as well.  TSH and liver transaminases were mildly elevated on 09/04/18. Referred to endocrinology by PCP.  3. CAD with RCA stent:Symptomatically stable. She previously refused to undergo the Lexiscan Myoview stress testI orderedto see if there has been progression of mid LAD disease.Continue ASA, metoprolol, and Crestor (increase to 40 mg for optimal LDL control).  4.Accelerated hypertension:This will need further monitoring.  5. Hyperlipidemia: LDL 89 in 02/2018. Will increase Crestor to 40 mg to aim <70.   COVID-19 Education: The signs and symptoms of COVID-19 were discussed with the patient and how to seek care for testing (follow up with PCP or arrange E-visit).  The importance of social distancing was discussed today.  Time:   Today, I have spent 25 minutes with the patient with telehealth technology discussing the above problems.     Medication Adjustments/Labs and Tests Ordered: Current medicines are reviewed at length with the patient today.  Concerns regarding medicines are outlined above.  Tests Ordered: No orders of the defined types were placed in this encounter.  Medication Changes: No orders of the defined types were placed in this encounter.   Disposition:  Follow up in 6 month(s)  Signed, Kate Sable, MD  10/03/2018 8:21 AM    [facility]

## 2018-10-18 ENCOUNTER — Emergency Department (HOSPITAL_COMMUNITY)
Admission: EM | Admit: 2018-10-18 | Discharge: 2018-10-18 | Disposition: A | Discharge status: Home / Self Care | Payer: Medicare Other | Attending: Emergency Medicine | Admitting: Emergency Medicine

## 2018-10-18 ENCOUNTER — Encounter (HOSPITAL_COMMUNITY): Payer: Self-pay

## 2018-10-18 ENCOUNTER — Emergency Department (HOSPITAL_COMMUNITY): Payer: Medicare Other

## 2018-10-18 ENCOUNTER — Other Ambulatory Visit: Payer: Self-pay

## 2018-10-18 DIAGNOSIS — S52602A Unspecified fracture of lower end of left ulna, initial encounter for closed fracture: Secondary | ICD-10-CM | POA: Diagnosis not present

## 2018-10-18 DIAGNOSIS — Z87891 Personal history of nicotine dependence: Secondary | ICD-10-CM | POA: Diagnosis not present

## 2018-10-18 DIAGNOSIS — Y939 Activity, unspecified: Secondary | ICD-10-CM | POA: Insufficient documentation

## 2018-10-18 DIAGNOSIS — W010XXA Fall on same level from slipping, tripping and stumbling without subsequent striking against object, initial encounter: Secondary | ICD-10-CM | POA: Diagnosis not present

## 2018-10-18 DIAGNOSIS — S52592A Other fractures of lower end of left radius, initial encounter for closed fracture: Secondary | ICD-10-CM | POA: Insufficient documentation

## 2018-10-18 DIAGNOSIS — S52615A Nondisplaced fracture of left ulna styloid process, initial encounter for closed fracture: Secondary | ICD-10-CM | POA: Diagnosis not present

## 2018-10-18 DIAGNOSIS — Z7982 Long term (current) use of aspirin: Secondary | ICD-10-CM | POA: Diagnosis not present

## 2018-10-18 DIAGNOSIS — Z79899 Other long term (current) drug therapy: Secondary | ICD-10-CM | POA: Insufficient documentation

## 2018-10-18 DIAGNOSIS — Y92009 Unspecified place in unspecified non-institutional (private) residence as the place of occurrence of the external cause: Secondary | ICD-10-CM | POA: Diagnosis not present

## 2018-10-18 DIAGNOSIS — I509 Heart failure, unspecified: Secondary | ICD-10-CM | POA: Insufficient documentation

## 2018-10-18 DIAGNOSIS — S59912A Unspecified injury of left forearm, initial encounter: Secondary | ICD-10-CM | POA: Diagnosis present

## 2018-10-18 DIAGNOSIS — Y999 Unspecified external cause status: Secondary | ICD-10-CM | POA: Insufficient documentation

## 2018-10-18 DIAGNOSIS — S52572A Other intraarticular fracture of lower end of left radius, initial encounter for closed fracture: Secondary | ICD-10-CM | POA: Diagnosis not present

## 2018-10-18 DIAGNOSIS — I25119 Atherosclerotic heart disease of native coronary artery with unspecified angina pectoris: Secondary | ICD-10-CM | POA: Insufficient documentation

## 2018-10-18 DIAGNOSIS — S52502A Unspecified fracture of the lower end of left radius, initial encounter for closed fracture: Secondary | ICD-10-CM | POA: Diagnosis not present

## 2018-10-18 DIAGNOSIS — I110 Hypertensive heart disease with heart failure: Secondary | ICD-10-CM | POA: Insufficient documentation

## 2018-10-18 MED ORDER — ACETAMINOPHEN 500 MG PO TABS
1000.0000 mg | ORAL_TABLET | Freq: Once | ORAL | Status: AC
  Administered 2018-10-18: 1000 mg via ORAL
  Filled 2018-10-18: qty 2

## 2018-10-18 NOTE — ED Provider Notes (Signed)
[facility] EMERGENCY DEPARTMENT Provider Note   CSN: [id number] Arrival date & time: 10/18/18  1454    History   Chief Complaint Chief Complaint  Patient presents with  . Arm Pain    HPI Sharon Compton is a 78 y.o. female.     Accidental trip and fall over a cord at home yesterday injuring her left wrist.  No other obvious injuries.  No head or neck trauma.  Severity of pain is moderate.  Range of motion makes pain worse.     Past Medical History:  Diagnosis Date  . A-fib [facility])    Early 2013, had TEE/DCCV at [facility]; pt reports DCCV 2014 at [facility] as well.  . Anxiety and depression   . Asthma   . 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 CAD (coronary artery disease) 02/2013   2.75 x 32 mm Rebel bare metal stent in the distal RCA.   [name] COPD (chronic obstructive pulmonary disease) ([place])   . Diverticular disease 10/01/2011  . Fibromyalgia   . GERD (gastroesophageal reflux disease)   . GI bleed    Recurrent, hx cecal AVMs, ablated 07/2011; hx PUD also  . Hiatal hernia   . History of pneumonia   . Hypertension   . Lymphedema   . MI, acute, non ST segment elevation ([place]) 10/02/2011  . Peptic ulcer disease   . Peripheral edema     Patient Active Problem List   Diagnosis Date Noted  . Controlled substance agreement signed 03/19/2018  . Opioid dependence with opioid-induced disorder ([place]) 03/19/2018  . Edema of extremities 01/28/2017  . CHF (congestive heart failure) ([place]) 01/28/2017  . Chronic atrial fibrillation   . Hypothyroidism   . CAD (coronary artery disease) of artery bypass graft 01/21/2015  . Esophageal reflux   . Essential hypertension   . Iron deficiency anemia 03/19/2014  . Insomnia 07/16/2013  . Major depressive disorder, recurrent episode, severe ([place]) 06/14/2013  . Posttraumatic stress disorder 06/14/2013  . CAD (coronary artery disease), native coronary artery 03/05/2013  . Angina, class III ([place]) 03/05/2013  . Hypokalemia 03/05/2013   . Hyperglycemia 10/03/2011  . Atrial fibrillation ([place]) 10/01/2011  . 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 Diverticulosis 10/01/2011  . Elevated LFTs 08/22/2011  . Anxiety disorder 08/21/2011  . GI bleed 08/20/2011  . Morbid obesity ([place]) 08/20/2011    Past Surgical History:  Procedure Laterality Date  . abd tumor removed     states was 10 lbs, benign  . ABDOMINAL EXPLORATION SURGERY    . ABDOMINAL HYSTERECTOMY  age 20   nonmalignant reason  . bladder stent    . CARDIAC CATHETERIZATION N/A 01/24/2015   Procedure: Left Heart Cath and Coronary Angiography;  Surgeon: Troy Sine, MD;  Location: [place] CV LAB;  Service: Cardiovascular;  Laterality: N/A;  . CARDIOVASCULAR STRESS TEST  09/2011   equivocal result, most likely low risk; pt refused the recommended cardiac cath to follow this up.  . CHOLECYSTECTOMY    . COLONOSCOPY  08/25/2011   Procedure: COLONOSCOPY;  Surgeon: Daneil Dolin, MD;  [facility];  Service: Endoscopy;  Laterality: N/A;  . COLONOSCOPY  10/03/2011   Procedure: COLONOSCOPY;  Surgeon: Daneil Dolin, MD;  [facility];  Service: Endoscopy;  Laterality: N/A;  NEEDS PHENERGAN 25 MG IV ON CALL  . COLONOSCOPY  10/04/2011   Procedure: COLONOSCOPY;  Surgeon: Daneil Dolin, MD;  [facility];  Service: Endoscopy;  Laterality: N/A;  Phenergan 12.5 mg ON CALL  . CORONARY ANGIOPLASTY  02/2013  . ESOPHAGOGASTRODUODENOSCOPY  08/24/2011   Procedure: ESOPHAGOGASTRODUODENOSCOPY (EGD);  Surgeon: Daneil Dolin, MD;  [facility];  Service: Endoscopy;  Laterality: N/A;  give phenergan 12.5mg  iv 30 mins prior to procedure  . KNEE SURGERY    . LEFT AND RIGHT HEART CATHETERIZATION WITH CORONARY ANGIOGRAM N/A 03/03/2013   Procedure: LEFT AND RIGHT HEART CATHETERIZATION WITH CORONARY ANGIOGRAM;  Surgeon: Burnell Blanks, MD;  Location: [facility] CATH LAB;  Service: Cardiovascular;  Laterality: N/A;  . PERCUTANEOUS CORONARY  STENT INTERVENTION (PCI-S)  02/2013   2.75 x 32 mm Rebel bare metal stent in the distal RCA.   [name] TRANSTHORACIC ECHOCARDIOGRAM  09/2011   EF 60-65%, septal hypokinesia     OB History    Gravida  3   Para  3   Term  3   Preterm      AB      Living  3     SAB      TAB      Ectopic      Multiple      Live Births               Home Medications    Prior to Admission medications   Medication Sig Start Date End Date Taking? Authorizing Provider  ALPRAZolam Duanne Moron) 1 MG tablet Take 1 tablet (1 mg total) by mouth 3 (three) times daily. 09/04/18   Sharion Balloon, FNP  amiodarone (PACERONE) 200 MG tablet Take 1 tablet by mouth once daily 09/23/18   Evelina Dun A, FNP  aspirin 81 MG tablet Take 81 mg by mouth daily.    [provider]  Black Cohosh-SoyIsoflav-Magnol (ESTROVEN MENOPAUSE RELIEF) CAPS Take 1 capsule by mouth every morning.    [provider]  CARTIA XT 240 MG 24 hr capsule TAKE 1 CAPSULE BY MOUTH ONCE DAILY 06/02/18   Herminio Commons, MD  losartan (COZAAR) 50 MG tablet Take 1 tablet (50 mg total) by mouth daily. 06/30/18 10/03/19  Eustaquio Maize, MD  metoprolol tartrate (LOPRESSOR) 25 MG tablet TAKE 1/2 (ONE-HALF) TABLET BY MOUTH TWICE DAILY 07/09/18   Evelina Dun A, FNP  nitroGLYCERIN (NITROSTAT) 0.4 MG SL tablet DISSOLVE ONE TABLET UNDER THE TONGUE EVERY 5 MINUTES AS NEEDED FOR CHEST PAIN.  DO NOT EXCEED A TOTAL OF 3 DOSES IN 15 MINUTES 06/30/18   Eustaquio Maize, MD  potassium chloride SA (K-DUR,KLOR-CON) 20 MEQ tablet Take 2 tablets (40 mEq total) by mouth 2 (two) times daily for 15 days. 01/29/18 10/03/19  Mesner, Corene Cornea, MD  rosuvastatin (CRESTOR) 40 MG tablet Take 1 tablet (40 mg total) by mouth at bedtime. 10/03/18   Herminio Commons, MD  torsemide (DEMADEX) 20 MG tablet Take 20mg  by mouth every morning 06/30/18   Eustaquio Maize, MD    Family History Family History  Problem Relation Age of Onset  . Diabetes Mother        Deceased   . Hypertension Mother   . Coronary artery disease Mother   . Heart failure Mother   . Cancer Father        Deceased  . Colon cancer Neg Hx     Social History Social History   Tobacco Use  . Smoking status: Former Smoker    Packs/day: 1.00    Years: 15.00    Pack years: 15.00    Types: Cigarettes  Last attempt to quit: 06/26/1995    Years since quitting: 23.3  . Smokeless tobacco: Never Used  Substance Use Topics  . Alcohol use: No    Alcohol/week: 0.0 standard drinks  . Drug use: No     Allergies   Iohexol; Dye fdc red [red dye]; Sulfa antibiotics; and Codeine   Review of Systems Review of Systems  All other systems reviewed and are negative.    Physical Exam Updated Vital Signs BP (!) 170/96 (BP Location: Right Arm)   Pulse (!) 48   Temp 97.9 F (36.6 C) (Oral)   Resp 16   Ht 5' (1.524 m)   Wt 81.6 kg   SpO2 95%   BMI 35.15 kg/m   Physical Exam Vitals signs and nursing note reviewed.  Constitutional:      Appearance: She is well-developed.  HENT:     Head: Normocephalic and atraumatic.  Eyes:     Conjunctiva/sclera: Conjunctivae normal.  Neck:     Musculoskeletal: Neck supple.  Cardiovascular:     Rate and Rhythm: Normal rate and regular rhythm.  Pulmonary:     Effort: Pulmonary effort is normal.     Breath sounds: Normal breath sounds.  Abdominal:     General: Bowel sounds are normal.     Palpations: Abdomen is soft.  Musculoskeletal:     Comments: Tender left wrist; pain with flexion and extension at wrist; ecchymosis distal anterior forearm; no evidence of compartment syndrome.  Skin:    General: Skin is warm and dry.  Neurological:     Mental Status: She is alert and oriented to person, place, and time.  Psychiatric:        Behavior: Behavior normal.      ED Treatments / Results  Labs (all labs ordered are listed, but only abnormal results are displayed) Labs Reviewed - No data to display  EKG None  Radiology Dg Forearm  Left  Result Date: 10/18/2018 CLINICAL DATA:  Wrist pain, burning and swelling after falling yesterday. EXAM: LEFT FOREARM - 2 VIEW COMPARISON:  None. FINDINGS: There are acute, impacted and mildly displaced intra-articular fractures of the distal radius. The radial fracture demonstrates predominately anterior displacement, better seen on the hand radiographs. There is a nondisplaced ulnar styloid fracture. The proximal radius and ulna are intact. The alignment is normal at the elbow without significant joint effusion. There is diffuse soft tissue swelling throughout the forearm. IMPRESSION: Impacted intra-articular fractures of the distal radius and ulna as described. No proximal injuries identified. Electronically Signed   By: Richardean Sale M.D.   On: 10/18/2018 17:12   Dg Hand Complete Left  Result Date: 10/18/2018 CLINICAL DATA:  Wrist pain, burning and swelling after falling yesterday. EXAM: LEFT HAND - COMPLETE 3+ VIEW COMPARISON:  None. FINDINGS: The bones are demineralized. There is an acute impacted fracture of the distal radius which demonstrates up to 8 mm of anterior displacement on the lateral view. This fracture involves the distal articular surface. There is a nondisplaced ulnar styloid fracture. The carpal bones appear intact. No fractures are identified within the hand. There is diffuse soft tissue swelling in the distal forearm, extending into the dorsum of the hand. IMPRESSION: Acute fractures of the distal radius and ulna. No acute osseous findings identified in the hand. Electronically Signed   By: Richardean Sale M.D.   On: 10/18/2018 17:13    Procedures Procedures (including critical care time)  Medications Ordered in ED Medications  acetaminophen (TYLENOL) tablet 1,000 mg (  1,000 mg Oral Given 10/18/18 1757)     Initial Impression / Assessment and Plan / ED Course  I have reviewed the triage vital signs and the nursing notes.  Pertinent labs & imaging results that were  available during my care of the patient were reviewed by me and considered in my medical decision making (see chart for details).        Plain films of left wrist reveal acute fractures of the distal radius and ulna.  I attempted to contact the orthopedic surgeon on call.  Will splint the wrist, ice, elevate.  Patient is intolerant of opiates and NSAIDs.  Will recommend Tylenol for pain.  She will follow-up with orthopedics on Monday.  Final Clinical Impressions(s) / ED Diagnoses   Final diagnoses:  Other closed fracture of distal end of left radius, initial encounter  Closed fracture of distal end of left ulna, unspecified fracture morphology, initial encounter    ED Discharge Orders    None       Nat Christen, MD 10/18/18 1929

## 2018-10-18 NOTE — ED Triage Notes (Signed)
Pt reports tripped over a cord yesterday and hurt left wrist.  Pt had wrist wrapped with ace wrap upon arrival.  Swelling noted.

## 2018-10-18 NOTE — Discharge Instructions (Addendum)
You have broken your radius and ulna bone in the wrist.  The nurse applied a partial cast.  Elevate, ice, Tylenol for pain.  Call Monday for an appointment with the orthopedic doctor.  2 different choices given.

## 2018-10-21 ENCOUNTER — Ambulatory Visit: Payer: Medicare Other | Admitting: Family

## 2018-10-22 ENCOUNTER — Ambulatory Visit (INDEPENDENT_AMBULATORY_CARE_PROVIDER_SITE_OTHER): Payer: Medicare Other | Admitting: Orthopedic Surgery

## 2018-10-22 ENCOUNTER — Other Ambulatory Visit: Payer: Self-pay

## 2018-10-22 VITALS — BP 170/81 | HR 65 | Temp 97.3°F | Ht 60.0 in | Wt 178.0 lb

## 2018-10-22 DIAGNOSIS — S52532A Colles' fracture of left radius, initial encounter for closed fracture: Secondary | ICD-10-CM | POA: Diagnosis not present

## 2018-10-22 DIAGNOSIS — I25700 Atherosclerosis of coronary artery bypass graft(s), unspecified, with unstable angina pectoris: Secondary | ICD-10-CM | POA: Diagnosis not present

## 2018-10-22 MED ORDER — TRAMADOL-ACETAMINOPHEN 37.5-325 MG PO TABS: 1.0000 | ORAL_TABLET | ORAL | 5 refills | Status: DC | PRN

## 2018-10-22 NOTE — Progress Notes (Signed)
Patient ID: Sharon Compton, female   DOB: April 15, 1941, 78 y.o.   MRN: [id number]  Chief Complaint  Patient presents with  . Wrist Injury    ER follow up on left wrist fracture, DOI 10-18-18.    HPI Sharon Compton is a 78 y.o. female.   78 year old female presents for evaluation of a left distal radius fracture.  Mechanism fell at home tripped over a cord Date of injury October 18, 2018 Describes 10 out of 10 pain cannot sleep at night Quality of pain sharp Patient has intolerance to codeine and Vicodin tried Tylenol for pain was not sufficient  She was seen in the ER had a anterior posterior splint placed presents for evaluation and treatment  Review of Systems Review of Systems  Musculoskeletal: Positive for gait problem and joint swelling.  Neurological: Negative for numbness.     Past Medical History:  Diagnosis Date  . A-fib [facility])    Early 2013, had TEE/DCCV at [facility]; pt reports DCCV 2014 at [facility] as well.  . Anxiety and depression   . Asthma   . 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 CAD (coronary artery disease) 02/2013   2.75 x 32 mm Rebel bare metal stent in the distal RCA.   [name] COPD (chronic obstructive pulmonary disease) ([place])   . Diverticular disease 10/01/2011  . Fibromyalgia   . GERD (gastroesophageal reflux disease)   . GI bleed    Recurrent, hx cecal AVMs, ablated 07/2011; hx PUD also  . Hiatal hernia   . History of pneumonia   . Hypertension   . Lymphedema   . MI, acute, non ST segment elevation ([place]) 10/02/2011  . Peptic ulcer disease   . Peripheral edema     Past Surgical History:  Procedure Laterality Date  . abd tumor removed     states was 10 lbs, benign  . ABDOMINAL EXPLORATION SURGERY    . ABDOMINAL HYSTERECTOMY  age 78   nonmalignant reason  . bladder stent    . CARDIAC CATHETERIZATION N/A 01/24/2015   Procedure: Left Heart Cath and Coronary Angiography;  Surgeon: Troy Sine, MD;  Location: [place] CV LAB;  Service:  Cardiovascular;  Laterality: N/A;  . CARDIOVASCULAR STRESS TEST  09/2011   equivocal result, most likely low risk; pt refused the recommended cardiac cath to follow this up.  . CHOLECYSTECTOMY    . COLONOSCOPY  08/25/2011   Procedure: COLONOSCOPY;  Surgeon: Daneil Dolin, MD;  [facility];  Service: Endoscopy;  Laterality: N/A;  . COLONOSCOPY  10/03/2011   Procedure: COLONOSCOPY;  Surgeon: Daneil Dolin, MD;  Location[facility];  Service: Endoscopy;  Laterality: N/A;  NEEDS PHENERGAN 25 MG IV ON CALL  . COLONOSCOPY  10/04/2011   Procedure: COLONOSCOPY;  Surgeon: Daneil Dolin, MD;  Location[facility];  Service: Endoscopy;  Laterality: N/A;  Phenergan 12.5 mg ON CALL  . CORONARY ANGIOPLASTY  02/2013  . ESOPHAGOGASTRODUODENOSCOPY  08/24/2011   Procedure: ESOPHAGOGASTRODUODENOSCOPY (EGD);  Surgeon: Daneil Dolin, MD;  [facility];  Service: Endoscopy;  Laterality: N/A;  give phenergan 12.5mg  iv 30 mins prior to procedure  . KNEE SURGERY    . LEFT AND RIGHT HEART CATHETERIZATION WITH CORONARY ANGIOGRAM N/A 03/03/2013   Procedure: LEFT AND RIGHT HEART CATHETERIZATION WITH CORONARY ANGIOGRAM;  Surgeon: Burnell Blanks, MD;  Location: [facility] CATH LAB;  Service: Cardiovascular;  Laterality: N/A;  . PERCUTANEOUS CORONARY STENT INTERVENTION (PCI-S)  02/2013  2.75 x 32 mm Rebel bare metal stent in the distal RCA.   [name] TRANSTHORACIC ECHOCARDIOGRAM  09/2011   EF 60-65%, septal hypokinesia      Physical Exam 1 Blood pressure (!) 170/81, pulse 65, temperature (!) 97.3 F (36.3 C), height 5' (1.524 m), weight 178 lb (80.7 kg). Physical Exam 2 The patient is well developed well nourished and well groomed. 3 Orientation to person place and time is normal  4 Mood is pleasant.  5 Ambulatory status normal but she has to braces on her knee she has had some revision surgery from a total knee  6 Inspection of the left wrist reveals moderate to severe tenderness   moderate  swelling no gross deformity 7 Range of motion assessment: The range of motion is diminished primarily secondary to pain 8 Stability tests are deferred because of pain but the x-ray shows no subluxation of the joint 9 Strength assessment muscle tone is normal resistance testing is deferred because of pain and swelling, skin ecchymotic in various places  10 Nerve function normal 11 Vascular function normal 12 Local lymphatic system normal  Opposite extremity right upper extremity there is no alignment abnormality, no contracture, no subluxation, no atrophy and neurovascular exam is intact   MEDICAL DECISION SECTION  xrays ordered at the hospital  5 views of the wrist and hand  My independent reading of xrays:   Multiple views of the wrist and the hand and upper extremity 5 total views shows distal radius impaction fracture with shortening ulnar prominence or length noted radial inclination is normal, oblique x-ray shows translation of fracture distal fragment volarly articular surface angle neutral, the more true lateral x-ray shows no significant translation but there is overriding of the distal fragment secondary to the impaction.  There is also ulnar styloid fracture.   Encounter Diagnosis  Name Primary?  . Closed Colles' fracture of left radius, initial encounter Yes     PLAN:   Meds ordered this encounter  Medications  . traMADol-acetaminophen (ULTRACET) 37.5-325 MG tablet    Sig: Take 1 tablet by mouth every 4 (four) hours as needed.    Dispense:  90 tablet    Refill:  5    Cast applied left upper extremity  Follow-up will be made 11 3 x-rays in the cast

## 2018-10-22 NOTE — Patient Instructions (Signed)
Elevate hand   Move your fingers

## 2018-10-24 ENCOUNTER — Telehealth: Payer: Self-pay | Admitting: Orthopedic Surgery

## 2018-10-24 NOTE — Telephone Encounter (Signed)
I called her to advise, she states she did get it. Was $44.

## 2018-10-24 NOTE — Telephone Encounter (Signed)
Patient left message on voicemail that she couldn't afford the medication that Dr. Aline Brochure has prescribed and sent to her pharmacy. She is asking if he could prescribe something else that is cheaper, because she is on a fixed income.  PATIENT USES [facility] IN [facility]

## 2018-10-24 NOTE — Telephone Encounter (Signed)
She told me she cannot take codeine she cannot take Vicodin  Percocet is too strong  There is no other medication other than Tylenol or Advil

## 2018-10-24 NOTE — Telephone Encounter (Signed)
Patient states Ultracet expensive. Wants something cheaper.

## 2018-11-03 ENCOUNTER — Ambulatory Visit: Payer: Medicare Other | Admitting: Orthopedic Surgery

## 2018-11-03 ENCOUNTER — Telehealth: Payer: Self-pay | Admitting: "Endocrinology

## 2018-11-03 ENCOUNTER — Ambulatory Visit: Payer: Medicare Other | Admitting: "Endocrinology

## 2018-11-03 NOTE — Telephone Encounter (Signed)
Talked with patient in detail 5/11 regarding her missed appt.  I will call her back in 2 weeks around 11/17/18 to re-schedule this appt.  She is having a lot of medical issues now and is seeing Dr Aline Brochure for Orthopedic issues.

## 2018-11-05 ENCOUNTER — Ambulatory Visit (INDEPENDENT_AMBULATORY_CARE_PROVIDER_SITE_OTHER): Payer: Medicare Other

## 2018-11-05 ENCOUNTER — Other Ambulatory Visit: Payer: Self-pay

## 2018-11-05 ENCOUNTER — Encounter: Payer: Self-pay | Admitting: Orthopedic Surgery

## 2018-11-05 ENCOUNTER — Ambulatory Visit (INDEPENDENT_AMBULATORY_CARE_PROVIDER_SITE_OTHER): Payer: Medicare Other | Admitting: Orthopedic Surgery

## 2018-11-05 VITALS — Temp 97.4°F

## 2018-11-05 DIAGNOSIS — S52532D Colles' fracture of left radius, subsequent encounter for closed fracture with routine healing: Secondary | ICD-10-CM | POA: Diagnosis not present

## 2018-11-05 DIAGNOSIS — G43909 Migraine, unspecified, not intractable, without status migrainosus: Secondary | ICD-10-CM | POA: Insufficient documentation

## 2018-11-05 NOTE — Patient Instructions (Signed)
Advise tylenol or ibuprofen for pain

## 2018-11-05 NOTE — Progress Notes (Signed)
Chief Complaint  Patient presents with  . Wrist Injury    left wrist fracture 10/18/18  improving    Day 18 doi   14/90 global period   Cast is loose   Xrays: fracture looks stable; radius short volar tilt neutral  Recast   xrays oop in 4 weeks

## 2018-11-19 ENCOUNTER — Other Ambulatory Visit: Payer: Self-pay | Admitting: Cardiovascular Disease

## 2018-12-01 DIAGNOSIS — S52532A Colles' fracture of left radius, initial encounter for closed fracture: Secondary | ICD-10-CM | POA: Insufficient documentation

## 2018-12-03 ENCOUNTER — Ambulatory Visit: Payer: Medicare Other | Admitting: Orthopedic Surgery

## 2018-12-10 ENCOUNTER — Emergency Department (HOSPITAL_COMMUNITY)
Admission: EM | Admit: 2018-12-10 | Discharge: 2018-12-10 | Disposition: A | Discharge status: Home / Self Care | Payer: Medicare Other | Attending: Emergency Medicine | Admitting: Emergency Medicine

## 2018-12-10 ENCOUNTER — Ambulatory Visit (INDEPENDENT_AMBULATORY_CARE_PROVIDER_SITE_OTHER): Payer: Medicare Other | Admitting: Orthopedic Surgery

## 2018-12-10 ENCOUNTER — Encounter (HOSPITAL_COMMUNITY): Payer: Self-pay | Admitting: Emergency Medicine

## 2018-12-10 ENCOUNTER — Other Ambulatory Visit: Payer: Self-pay

## 2018-12-10 ENCOUNTER — Ambulatory Visit (INDEPENDENT_AMBULATORY_CARE_PROVIDER_SITE_OTHER): Payer: Medicare Other

## 2018-12-10 ENCOUNTER — Encounter: Payer: Self-pay | Admitting: Orthopedic Surgery

## 2018-12-10 VITALS — Temp 97.3°F

## 2018-12-10 DIAGNOSIS — W010XXA Fall on same level from slipping, tripping and stumbling without subsequent striking against object, initial encounter: Secondary | ICD-10-CM | POA: Insufficient documentation

## 2018-12-10 DIAGNOSIS — S52532D Colles' fracture of left radius, subsequent encounter for closed fracture with routine healing: Secondary | ICD-10-CM

## 2018-12-10 DIAGNOSIS — R51 Headache: Secondary | ICD-10-CM | POA: Insufficient documentation

## 2018-12-10 DIAGNOSIS — Z5321 Procedure and treatment not carried out due to patient leaving prior to being seen by health care provider: Secondary | ICD-10-CM | POA: Diagnosis not present

## 2018-12-10 NOTE — Progress Notes (Signed)
Patient ID: Sharon Compton, female   DOB: 23-Sep-1940, 78 y.o.   MRN: [id number]  FRACTURE CARE   Chief Complaint  Patient presents with  . Wrist Injury    10/18/18 left wrist     Encounter Diagnosis  Name Primary?  . Closed Colles' fracture of left radius with routine healing, subsequent encounter 10/18/18 Yes    CURRENT TREATMENT : cast. Cast off today    POST INJURY DAY: 53  GLOBAL PERIOD DAY 49/90  Apply splint   Fu 4 weeks, exam only   Encounter Diagnosis  Name Primary?  . Closed Colles' fracture of left radius with routine healing, subsequent encounter 10/18/18 Yes

## 2018-12-10 NOTE — ED Triage Notes (Signed)
Patient reports falling 3 weeks ago. C/O headaches sent. Patient reports she was sent by physician for CT scan.

## 2018-12-10 NOTE — ED Notes (Signed)
Pt was leaving. Advised pt room now ready. Pt states she just wants to go home.

## 2018-12-16 ENCOUNTER — Other Ambulatory Visit: Payer: Self-pay | Admitting: Family

## 2018-12-22 ENCOUNTER — Other Ambulatory Visit: Payer: Self-pay | Admitting: Family

## 2018-12-22 DIAGNOSIS — I10 Essential (primary) hypertension: Secondary | ICD-10-CM

## 2018-12-22 MED ORDER — LOSARTAN POTASSIUM 50 MG PO TABS: 50.0000 mg | ORAL_TABLET | Freq: Every day | ORAL | 1 refills | Status: DC

## 2018-12-22 NOTE — Telephone Encounter (Signed)
Pt notified of RX 

## 2018-12-23 ENCOUNTER — Other Ambulatory Visit: Payer: Self-pay | Admitting: *Deleted

## 2019-01-07 ENCOUNTER — Ambulatory Visit: Payer: Medicare Other | Admitting: Orthopedic Surgery

## 2019-01-12 ENCOUNTER — Ambulatory Visit: Payer: Medicare Other | Admitting: Orthopedic Surgery

## 2019-01-16 ENCOUNTER — Other Ambulatory Visit: Payer: Self-pay

## 2019-01-16 ENCOUNTER — Ambulatory Visit (INDEPENDENT_AMBULATORY_CARE_PROVIDER_SITE_OTHER): Payer: Medicare Other | Admitting: Orthopedic Surgery

## 2019-01-16 DIAGNOSIS — S52532D Colles' fracture of left radius, subsequent encounter for closed fracture with routine healing: Secondary | ICD-10-CM

## 2019-01-16 NOTE — Progress Notes (Signed)
Virtual Visit via Video Note  I connected with Sharon Compton on 01/16/19 at 11:20 AM EDT by a video enabled telemedicine application and verified that I am speaking with the correct person using two identifiers.  Location: Patient: at home  Provider: Aline Brochure at office    I discussed the limitations of evaluation and management by telemedicine and the availability of in person appointments. The patient expressed understanding and agreed to proceed.  History of Present Illness: Patient had a fracture of her wrist Colles' type fracture she was scheduled to come in for evaluation and treatment to check her range of motion and strength fracture healing on last x-ray  Complains of patient having fever 104 she is also sick diarrhea nose running.  Range of motion seems good with her fingers she has some pain with extension flexion of the wrist   Observations/Objective: As above   Assessment and Plan: Active range of motion,    Follow Up Instructions: Virtual visit in 4 to 5 weeks    I discussed the assessment and treatment plan with the patient. The patient was provided an opportunity to ask questions and all were answered. The patient agreed with the plan and demonstrated an understanding of the instructions.   The patient was advised to call back or seek an in-person evaluation if the symptoms worsen or if the condition fails to improve as anticipated.  I provided 5 minutes of non-face-to-face time during this encounter.   Arther Abbott, MD

## 2019-01-19 ENCOUNTER — Telehealth: Payer: Self-pay | Admitting: Orthopedic Surgery

## 2019-01-19 NOTE — Telephone Encounter (Signed)
-----   Message from Carole Civil, MD sent at 01/16/2019 12:00 PM EDT ----- Last appt on a weds in 4 - 5 weeks virtual

## 2019-01-19 NOTE — Telephone Encounter (Signed)
I called patient, left message, to offer virtual appointment on a Wednesday, 4 to 5 weeks, as per Dr Aline Brochure. Awaiting call back from patient.

## 2019-01-23 NOTE — Telephone Encounter (Signed)
Called again, left another voice message. Scheduled appointment per Dr Aline Brochure for virtual visit accordingly; mailed appointment information to patient.

## 2019-02-18 ENCOUNTER — Telehealth: Payer: Self-pay | Admitting: Orthopedic Surgery

## 2019-02-18 DIAGNOSIS — M545 Low back pain, unspecified: Secondary | ICD-10-CM

## 2019-02-18 DIAGNOSIS — G8929 Other chronic pain: Secondary | ICD-10-CM

## 2019-02-18 NOTE — Telephone Encounter (Signed)
Patient returned call - the response was relayed, although patient also is mentioning some other medical issues, which may or may not be related to fall in June of 2020 - it was not clear in the telephone conversation initially or on her call back, as she also mentioned history of knee replacement surgery several years ago by Dr [facility] in [place]. Please call her back to further discuss

## 2019-02-18 NOTE — Telephone Encounter (Signed)
Sure

## 2019-02-18 NOTE — Telephone Encounter (Signed)
I called her she can get a back brace at a pharmacy over the counter, we have not evaluated her for her back, per Dr Aline Brochure.   She already has a doctor for her knees, we do not see her for this. Can not order xrays, she needs to call the doctor who takes care of her knees  She is being treated for a wrist fracture.   Left message for her to call back so we can advise.

## 2019-02-18 NOTE — Telephone Encounter (Signed)
I called her back.  Patient states she did not need to speak to me. She understands she needs to see her primary care for her face and for her kidney pain.

## 2019-02-18 NOTE — Telephone Encounter (Signed)
Call received from patient; states she is aware of her virtual appointment with Dr Aline Brochure 02/25/19, and is asking if she may get Xrays ordered for her left knee, for her to have done at [facility] radiology. States she had fallen after her last in-person visit here, 12/10/18, and hurt knee and face; said went on to [facility] Emergency room. Relays she waited 4 hours; said was unable to wait any longer and left before being seen by provider. States her left knee is still hurting.  Please advise.  [phone].

## 2019-02-23 ENCOUNTER — Other Ambulatory Visit: Payer: Self-pay | Admitting: *Deleted

## 2019-02-23 DIAGNOSIS — Z955 Presence of coronary angioplasty implant and graft: Secondary | ICD-10-CM

## 2019-02-23 MED ORDER — NITROGLYCERIN 0.4 MG SL SUBL: SUBLINGUAL_TABLET | SUBLINGUAL | 2 refills | Status: DC

## 2019-02-25 ENCOUNTER — Other Ambulatory Visit: Payer: Self-pay

## 2019-02-25 ENCOUNTER — Ambulatory Visit: Payer: Medicare Other | Admitting: Orthopedic Surgery

## 2019-02-26 ENCOUNTER — Other Ambulatory Visit: Payer: Self-pay | Admitting: Family

## 2019-02-26 DIAGNOSIS — Z79899 Other long term (current) drug therapy: Secondary | ICD-10-CM

## 2019-02-26 DIAGNOSIS — F411 Generalized anxiety disorder: Secondary | ICD-10-CM

## 2019-02-27 ENCOUNTER — Telehealth: Payer: Self-pay | Admitting: Family

## 2019-02-27 DIAGNOSIS — Z79899 Other long term (current) drug therapy: Secondary | ICD-10-CM

## 2019-02-27 DIAGNOSIS — F411 Generalized anxiety disorder: Secondary | ICD-10-CM

## 2019-02-27 MED ORDER — ALPRAZOLAM 1 MG PO TABS: 1.0000 mg | ORAL_TABLET | Freq: Three times a day (TID) | ORAL | 5 refills | Status: DC

## 2019-02-27 NOTE — Telephone Encounter (Signed)
Prescription sent to pharmacy.

## 2019-02-27 NOTE — Telephone Encounter (Signed)
Please advise on refill.

## 2019-03-09 ENCOUNTER — Telehealth: Payer: Self-pay | Admitting: Family

## 2019-03-09 NOTE — Telephone Encounter (Signed)
Appt made

## 2019-03-10 ENCOUNTER — Ambulatory Visit: Payer: Medicare Other | Admitting: Family

## 2019-03-12 ENCOUNTER — Other Ambulatory Visit: Payer: Self-pay | Admitting: Family

## 2019-03-12 DIAGNOSIS — I10 Essential (primary) hypertension: Secondary | ICD-10-CM

## 2019-03-13 MED ORDER — METOPROLOL TARTRATE 25 MG PO TABS: 12.5000 mg | ORAL_TABLET | Freq: Two times a day (BID) | ORAL | 0 refills | Status: DC

## 2019-03-13 MED ORDER — AMIODARONE HCL 200 MG PO TABS: 200.0000 mg | ORAL_TABLET | Freq: Every day | ORAL | 0 refills | Status: DC

## 2019-03-13 NOTE — Telephone Encounter (Signed)
RF failed, resent 

## 2019-03-13 NOTE — Addendum Note (Signed)
Addended by: Antonietta Barcelona D on: 03/13/2019 01:42 PM   Modules accepted: Orders

## 2019-03-16 ENCOUNTER — Ambulatory Visit: Payer: Medicare Other | Admitting: Orthopedic Surgery

## 2019-03-17 ENCOUNTER — Ambulatory Visit (INDEPENDENT_AMBULATORY_CARE_PROVIDER_SITE_OTHER): Payer: Medicare Other | Admitting: Orthopedic Surgery

## 2019-03-17 ENCOUNTER — Other Ambulatory Visit: Payer: Self-pay

## 2019-03-17 DIAGNOSIS — S52532D Colles' fracture of left radius, subsequent encounter for closed fracture with routine healing: Secondary | ICD-10-CM

## 2019-03-17 NOTE — Progress Notes (Signed)
She s concerned about not being seen when her husband and a fever   I offered her an appt and she says she cannot come now.  I told her to come on a Monday Wednesday Friday when she can get here based on her schedule.  She says she will let us know

## 2019-03-20 ENCOUNTER — Telehealth: Payer: Self-pay | Admitting: Family

## 2019-03-20 NOTE — Telephone Encounter (Signed)
Pt cleared for appt

## 2019-03-23 ENCOUNTER — Ambulatory Visit (INDEPENDENT_AMBULATORY_CARE_PROVIDER_SITE_OTHER): Payer: Medicare Other | Admitting: Family

## 2019-03-23 ENCOUNTER — Encounter: Payer: Self-pay | Admitting: Family

## 2019-03-23 DIAGNOSIS — I509 Heart failure, unspecified: Secondary | ICD-10-CM

## 2019-03-23 DIAGNOSIS — I25700 Atherosclerosis of coronary artery bypass graft(s), unspecified, with unstable angina pectoris: Secondary | ICD-10-CM

## 2019-03-23 DIAGNOSIS — E785 Hyperlipidemia, unspecified: Secondary | ICD-10-CM | POA: Diagnosis not present

## 2019-03-23 DIAGNOSIS — G4700 Insomnia, unspecified: Secondary | ICD-10-CM

## 2019-03-23 DIAGNOSIS — E039 Hypothyroidism, unspecified: Secondary | ICD-10-CM | POA: Diagnosis not present

## 2019-03-23 DIAGNOSIS — F411 Generalized anxiety disorder: Secondary | ICD-10-CM

## 2019-03-23 DIAGNOSIS — I10 Essential (primary) hypertension: Secondary | ICD-10-CM | POA: Diagnosis not present

## 2019-03-23 DIAGNOSIS — F332 Major depressive disorder, recurrent severe without psychotic features: Secondary | ICD-10-CM

## 2019-03-23 DIAGNOSIS — D508 Other iron deficiency anemias: Secondary | ICD-10-CM | POA: Diagnosis not present

## 2019-03-23 DIAGNOSIS — Z79899 Other long term (current) drug therapy: Secondary | ICD-10-CM | POA: Diagnosis not present

## 2019-03-23 DIAGNOSIS — I2510 Atherosclerotic heart disease of native coronary artery without angina pectoris: Secondary | ICD-10-CM | POA: Diagnosis not present

## 2019-03-23 MED ORDER — ALPRAZOLAM 1 MG PO TABS: 1.0000 mg | ORAL_TABLET | Freq: Three times a day (TID) | ORAL | 2 refills | Status: DC

## 2019-03-23 NOTE — Progress Notes (Signed)
 Virtual Visit via telephone Note Due to COVID-19 pandemic this visit was conducted virtually. This visit type was conducted due to national recommendations for restrictions regarding the COVID-19 Pandemic (e.g. social distancing, sheltering in place) in an effort to limit this patient's exposure and mitigate transmission in our community. All issues noted in this document were discussed and addressed.  A physical exam was not performed with this format.  I connected with Sharon Compton on 03/23/19 at 11:44 AM by telephone and verified that I am speaking with the correct person using two identifiers. Sharon Compton is currently located at home and husband is currently with her during visit. The provider, Christy Hawks, FNP is located in their office at time of visit.  I discussed the limitations, risks, security and privacy concerns of performing an evaluation and management service by telephone and the availability of in person appointments. I also discussed with the patient that there may be a patient responsible charge related to this service. The patient expressed understanding and agreed to proceed.   History and Present Illness:  PT presents to the office today follow up for chronic follow up. She is followed by Cardiologists every 6 months for CAD and CHF. She is followed by Hematologists for iron deficiency.   She states she can not take her levothyroxine because it makes it sick. She refused her Endocrinologists. She has refuses behavorial health appt, because they "called the law" on her.   She fractured her left radius and ulna after falling on 10/18/18. She is followed by Ortho.  Hypertension This is a chronic problem. The current episode started more than 1 year ago. The problem has been resolved since onset. The problem is controlled. Associated symptoms include anxiety, malaise/fatigue and peripheral edema. Pertinent negatives include no shortness of breath. Risk factors for  coronary artery disease include dyslipidemia, obesity and sedentary lifestyle. Past treatments include diuretics, beta blockers and calcium channel blockers. The current treatment provides moderate improvement. Hypertensive end-organ damage includes heart failure. Identifiable causes of hypertension include a thyroid problem.  Anxiety Presents for follow-up visit. Symptoms include depressed mood, excessive worry, insomnia, irritability, nervous/anxious behavior and restlessness. Patient reports no shortness of breath. Symptoms occur most days. The severity of symptoms is moderate.    Hyperlipidemia This is a chronic problem. The current episode started more than 1 year ago. Exacerbating diseases include obesity. Pertinent negatives include no shortness of breath. Current antihyperlipidemic treatment includes statins. The current treatment provides moderate improvement of lipids.  Thyroid Problem Presents for follow-up visit. Symptoms include anxiety, constipation, depressed mood, fatigue and hoarse voice. The symptoms have been stable. Her past medical history is significant for heart failure and hyperlipidemia.  Insomnia Primary symptoms: difficulty falling asleep, frequent awakening, malaise/fatigue.  The current episode started more than one year. The onset quality is gradual. The problem occurs intermittently.      Review of Systems  Constitutional: Positive for fatigue, irritability and malaise/fatigue.  HENT: Positive for hoarse voice.   Respiratory: Negative for shortness of breath.   Gastrointestinal: Positive for constipation.  Psychiatric/Behavioral: The patient is nervous/anxious and has insomnia.   All other systems reviewed and are negative.    Observations/Objective: No SOB or distress noted  Assessment and Plan: Sharon Compton comes in today with chief complaint of No chief complaint on file.   Diagnosis and orders addressed:  1. Essential hypertension - CMP14+EGFR   2. Congestive heart failure, unspecified HF chronicity, unspecified heart failure type (HCC) - CMP14+EGFR    3. Coronary artery disease involving coronary bypass graft of native heart with unstable angina pectoris (HCC) - CMP14+EGFR - Lipid panel  4. Hypothyroidism, unspecified type - CMP14+EGFR - Thyroid Panel With TSH - Ambulatory referral to Endocrinology  5. Morbid obesity ([place]) - CMP14+EGFR  6. Severe episode of recurrent major depressive disorder, without psychotic features ([place]) - CMP14+EGFR  7. Other iron deficiency anemia - CMP14+EGFR - Anemia Profile B  8. Insomnia, unspecified type - CMP14+EGFR  9. Generalized anxiety disorder - CMP14+EGFR - ALPRAZolam (XANAX) 1 MG tablet; Take 1 tablet (1 mg total) by mouth 3 (three) times daily.  Dispense: 90 tablet; Refill: 2  10. Coronary artery disease involving native coronary artery of native heart without angina pectori - CMP14+EGFR - Lipid panel  11. Controlled substance agreement signed - CMP14+EGFR - ALPRAZolam (XANAX) 1 MG tablet; Take 1 tablet (1 mg total) by mouth 3 (three) times daily.  Dispense: 90 tablet; Refill: 2  12. Hyperlipidemia, unspecified hyperlipidemia type - CMP14+EGFR - Lipid panel   Labs pending (pt will come in and get them drawn in the next few weeks) Keep Cardiologists follow up, She will follow up with Endo for hypothyroidism.  Pt reviewed in [place] controlled database- No red flags noted Health Maintenance reviewed Diet and exercise encouraged  Follow up plan: 3 months       I discussed the assessment and treatment plan with the patient. The patient was provided an opportunity to ask questions and all were answered. The patient agreed with the plan and demonstrated an understanding of the instructions.   The patient was advised to call back or seek an in-person evaluation if the symptoms worsen or if the condition fails to improve as anticipated.  The above assessment and management  plan was discussed with the patient. The patient verbalized understanding of and has agreed to the management plan. Patient is aware to call the clinic if symptoms persist or worsen. Patient is aware when to return to the clinic for a follow-up visit. Patient educated on when it is appropriate to go to the emergency department.   Time call ended:  12:10 pm   I provided 26 minutes of non-face-to-face time during this encounter.    Evelina Dun, FNP

## 2019-03-30 ENCOUNTER — Other Ambulatory Visit: Payer: Self-pay

## 2019-03-31 ENCOUNTER — Encounter: Payer: Self-pay | Admitting: Family

## 2019-03-31 ENCOUNTER — Ambulatory Visit (INDEPENDENT_AMBULATORY_CARE_PROVIDER_SITE_OTHER): Payer: Medicare Other | Admitting: Family

## 2019-03-31 DIAGNOSIS — I25700 Atherosclerosis of coronary artery bypass graft(s), unspecified, with unstable angina pectoris: Secondary | ICD-10-CM

## 2019-03-31 DIAGNOSIS — S52532D Colles' fracture of left radius, subsequent encounter for closed fracture with routine healing: Secondary | ICD-10-CM | POA: Diagnosis not present

## 2019-03-31 DIAGNOSIS — R519 Headache, unspecified: Secondary | ICD-10-CM

## 2019-03-31 DIAGNOSIS — R296 Repeated falls: Secondary | ICD-10-CM

## 2019-03-31 DIAGNOSIS — Z9119 Patient's noncompliance with other medical treatment and regimen: Secondary | ICD-10-CM | POA: Diagnosis not present

## 2019-03-31 DIAGNOSIS — Z91199 Patient's noncompliance with other medical treatment and regimen due to unspecified reason: Secondary | ICD-10-CM

## 2019-03-31 NOTE — Progress Notes (Signed)
   Virtual Visit via telephone Note Due to COVID-19 pandemic this visit was conducted virtually. This visit type was conducted due to national recommendations for restrictions regarding the COVID-19 Pandemic (e.g. social distancing, sheltering in place) in an effort to limit this patient's exposure and mitigate transmission in our community. All issues noted in this document were discussed and addressed.  A physical exam was not performed with this format.  I connected with Sharon Compton on 03/31/19 at 12:53 pm by telephone and verified that I am speaking with the correct person using two identifiers. Sharon Compton is currently located at home and husband is currently with her during visit. The provider, Evelina Dun, FNP is located in their office at time of visit.  I discussed the limitations, risks, security and privacy concerns of performing an evaluation and management service by telephone and the availability of in person appointments. I also discussed with the patient that there may be a patient responsible charge related to this service. The patient expressed understanding and agreed to proceed.   History and Present Illness:  HPI   Pt calls the office today for hospital follow up. She states she fell June and went to the ED, but sat and waited for 4 hours and never saw anyone. She reports she continues to have headache. She was suppose to be seen face to face today, but called and switched the visit to a telephone visit today because her Husband did not feel well.   She is followed by Ortho for a fracture of her wrist. She states she does not have full ROM of her wrist and it is "still broken".  Even though this was done on 09/2018.   Review of Systems  All other systems reviewed and are negative.    Observations/Objective: No SOB or distress noted   Assessment and Plan: 1. Frequent falls  2. Closed Colles' fracture of left radius with routine healing, subsequent encounter  3. Nonintractable headache, unspecified chronicity pattern, unspecified headache type  4. Noncompliance  Fall preventions discussed PT is not the best historian. I do not believe she has any bleed since her fall was 4+ months ago. She will need to be seen face to face so I can do a neuro exam. She agrees. If headaches continue we will do Neurologists referral. Keep Ortho follow up    I discussed the assessment and treatment plan with the patient. The patient was provided an opportunity to ask questions and all were answered. The patient agreed with the plan and demonstrated an understanding of the instructions.   The patient was advised to call back or seek an in-person evaluation if the symptoms worsen or if the condition fails to improve as anticipated.  The above assessment and management plan was discussed with the patient. The patient verbalized understanding of and has agreed to the management plan. Patient is aware to call the clinic if symptoms persist or worsen. Patient is aware when to return to the clinic for a follow-up visit. Patient educated on when it is appropriate to go to the emergency department.   Time call ended:  1:15 pm  I provided 22 minutes of non-face-to-face time during this encounter.    Evelina Dun, FNP

## 2019-04-06 ENCOUNTER — Other Ambulatory Visit: Payer: Self-pay

## 2019-04-07 ENCOUNTER — Ambulatory Visit (INDEPENDENT_AMBULATORY_CARE_PROVIDER_SITE_OTHER): Payer: Medicare Other | Admitting: Family

## 2019-04-07 ENCOUNTER — Encounter: Payer: Self-pay | Admitting: Family

## 2019-04-07 VITALS — BP 159/73 | HR 58 | Temp 98.9°F | Ht 60.0 in | Wt 173.0 lb

## 2019-04-07 DIAGNOSIS — F332 Major depressive disorder, recurrent severe without psychotic features: Secondary | ICD-10-CM

## 2019-04-07 DIAGNOSIS — R350 Frequency of micturition: Secondary | ICD-10-CM

## 2019-04-07 DIAGNOSIS — Z79899 Other long term (current) drug therapy: Secondary | ICD-10-CM

## 2019-04-07 DIAGNOSIS — I25700 Atherosclerosis of coronary artery bypass graft(s), unspecified, with unstable angina pectoris: Secondary | ICD-10-CM

## 2019-04-07 DIAGNOSIS — D508 Other iron deficiency anemias: Secondary | ICD-10-CM

## 2019-04-07 DIAGNOSIS — K219 Gastro-esophageal reflux disease without esophagitis: Secondary | ICD-10-CM | POA: Diagnosis not present

## 2019-04-07 DIAGNOSIS — F411 Generalized anxiety disorder: Secondary | ICD-10-CM

## 2019-04-07 DIAGNOSIS — G4700 Insomnia, unspecified: Secondary | ICD-10-CM

## 2019-04-07 DIAGNOSIS — I10 Essential (primary) hypertension: Secondary | ICD-10-CM | POA: Diagnosis not present

## 2019-04-07 DIAGNOSIS — I509 Heart failure, unspecified: Secondary | ICD-10-CM

## 2019-04-07 DIAGNOSIS — E039 Hypothyroidism, unspecified: Secondary | ICD-10-CM

## 2019-04-07 DIAGNOSIS — G44319 Acute post-traumatic headache, not intractable: Secondary | ICD-10-CM

## 2019-04-07 DIAGNOSIS — J019 Acute sinusitis, unspecified: Secondary | ICD-10-CM

## 2019-04-07 MED ORDER — AMOXICILLIN-POT CLAVULANATE 875-125 MG PO TABS: 1.0000 | ORAL_TABLET | Freq: Two times a day (BID) | ORAL | 0 refills | Status: DC

## 2019-04-07 MED ORDER — LIOTHYRONINE SODIUM 25 MCG PO TABS: 25.0000 ug | ORAL_TABLET | Freq: Every day | ORAL | 1 refills | Status: DC

## 2019-04-07 NOTE — Patient Instructions (Signed)
General Headache Without Cause °A headache is pain or discomfort that is felt around the head or neck area. There are many causes and types of headaches. In some cases, the cause may not be found. °Follow these instructions at home: °Watch your condition for any changes. Let your doctor know about them. Take these steps to help with your condition: °Managing pain ° °  ° °· Take over-the-counter and prescription medicines only as told by your doctor. °· Lie down in a dark, quiet room when you have a headache. °· If told, put ice on your head and neck area: °? Put ice in a plastic bag. °? Place a towel between your skin and the bag. °? Leave the ice on for 20 minutes, 2-3 times per day. °· If told, put heat on the affected area. Use the heat source that your doctor recommends, such as a moist heat pack or a heating pad. °? Place a towel between your skin and the heat source. °? Leave the heat on for 20-30 minutes. °? Remove the heat if your skin turns bright red. This is very important if you are unable to feel pain, heat, or cold. You may have a greater risk of getting burned. °· Keep lights dim if bright lights bother you or make your headaches worse. °Eating and drinking °· Eat meals on a regular schedule. °· If you drink alcohol: °? Limit how much you use to: °§ 0-1 drink a day for women. °§ 0-2 drinks a day for men. °? Be aware of how much alcohol is in your drink. In the [place] one drink equals one 12 oz bottle of beer (355 mL), one 5 oz glass of wine (148 mL), or one 1½ oz glass of hard liquor (44 mL). °· Stop drinking caffeine, or reduce how much caffeine you drink. °General instructions ° °· Keep a journal to find out if certain things bring on headaches. For example, write down: °? What you eat and drink. °? How much sleep you get. °? Any change to your diet or medicines. °· Get a massage or try other ways to relax. °· Limit stress. °· Sit up straight. Do not tighten (tense) your muscles. °· Do not use any  products that contain nicotine or tobacco. This includes cigarettes, e-cigarettes, and chewing tobacco. If you need help quitting, ask your doctor. °· Exercise regularly as told by your doctor. °· Get enough sleep. This often means 7-9 hours of sleep each night. °· Keep all follow-up visits as told by your doctor. This is important. °Contact a doctor if: °· Your symptoms are not helped by medicine. °· You have a headache that feels different than the other headaches. °· You feel sick to your stomach (nauseous) or you throw up (vomit). °· You have a fever. °Get help right away if: °· Your headache gets very bad quickly. °· Your headache gets worse after a lot of physical activity. °· You keep throwing up. °· You have a stiff neck. °· You have trouble seeing. °· You have trouble speaking. °· You have pain in the eye or ear. °· Your muscles are weak or you lose muscle control. °· You lose your balance or have trouble walking. °· You feel like you will pass out (faint) or you pass out. °· You are mixed up (confused). °· You have a seizure. °Summary °· A headache is pain or discomfort that is felt around the head or neck area. °· There are many causes and   types of headaches. In some cases, the cause may not be found. °· Keep a journal to help find out what causes your headaches. Watch your condition for any changes. Let your doctor know about them. °· Contact a doctor if you have a headache that is different from usual, or if your headache is not helped by medicine. °· Get help right away if your headache gets very bad, you throw up, you have trouble seeing, you lose your balance, or you have a seizure. °This information is not intended to replace advice given to you by your health care provider. Make sure you discuss any questions you have with your health care provider. °Document Released: 03/20/2008 Document Revised: 12/30/2017 Document Reviewed: 12/30/2017 °Elsevier Patient Education © 2020 [employer] ° °

## 2019-04-07 NOTE — Progress Notes (Signed)
Subjective:    Patient ID: Sharon Compton, female    DOB: 04-11-1941, 78 y.o.   MRN: [id number]  Chief Complaint  Patient presents with  . follow up on falls  . Hypertension   PT presents to the office today follow up forchronic follow up. She is followed by Cardiologists every 6 months for CAD, A Fib, and CHF. She is followed by Hematologists for iron deficiency.  She states she can not take her levothyroxine because it makes it sick.She refused her Endocrinologists. She has refuses behavorial health appt, because they "called the law" on her.  She is followed by Ortho for a fracture of her wrist. She states she does not have full ROM of her wrist and it is "still broken".  Even though this was done on 09/2018. Hypertension This is a chronic problem. The current episode started more than 1 year ago. The problem has been waxing and waning since onset. Associated symptoms include anxiety, headaches and peripheral edema. Pertinent negatives include no malaise/fatigue or shortness of breath. Risk factors for coronary artery disease include dyslipidemia, obesity and sedentary lifestyle. The current treatment provides moderate improvement. Hypertensive end-organ damage includes CAD/MI and heart failure. Identifiable causes of hypertension include a thyroid problem.  Congestive Heart Failure Presents for follow-up visit. Associated symptoms include edema and fatigue. Pertinent negatives include no claudication or shortness of breath. The symptoms have been stable.  Gastroesophageal Reflux She complains of belching, heartburn and nausea. She reports no coughing. This is a chronic problem. The current episode started more than 1 year ago. The problem occurs occasionally. The problem has been waxing and waning. The symptoms are aggravated by certain foods. Associated symptoms include fatigue. She has tried an antacid for the symptoms. The treatment provided no relief.  Thyroid Problem Presents for  follow-up visit. Symptoms include constipation, depressed mood, dry skin and fatigue. Patient reports no diarrhea. The symptoms have been worsening. Her past medical history is significant for heart failure.  Depression        This is a chronic problem.  The current episode started more than 1 year ago.   The problem occurs intermittently.  The problem has been waxing and waning since onset.  Associated symptoms include decreased concentration, fatigue, insomnia, irritable, restlessness, decreased interest, headaches and sad.  Past medical history includes thyroid problem and anxiety.   Insomnia Primary symptoms: difficulty falling asleep, no malaise/fatigue.  The current episode started more than one year. The onset quality is gradual. The problem occurs intermittently. PMH includes: depression.  Anxiety Presents for follow-up visit. Symptoms include decreased concentration, depressed mood, excessive worry, insomnia, malaise, nausea and restlessness. Patient reports no shortness of breath. Symptoms occur occasionally. The severity of symptoms is moderate.    Sinusitis This is a new problem. The current episode started 1 to 4 weeks ago. The problem has been waxing and waning since onset. There has been no fever. Her pain is at a severity of 8/10. The pain is moderate. Associated symptoms include headaches and sinus pressure. Pertinent negatives include no congestion, coughing or shortness of breath. Past treatments include spray decongestants. The treatment provided mild relief.  Headache  This is a new problem. The current episode started more than 1 month ago. The problem occurs daily. The problem has been gradually worsening. The pain is located in the frontal region. The pain is at a severity of 8/10. The pain is moderate. Associated symptoms include insomnia, nausea and sinus pressure. Pertinent negatives include no coughing.  Her past medical history is significant for hypertension.       Review of Systems  Constitutional: Positive for fatigue. Negative for malaise/fatigue.  HENT: Positive for sinus pressure. Negative for congestion.   Respiratory: Negative for cough and shortness of breath.   Cardiovascular: Negative for claudication.  Gastrointestinal: Positive for constipation, heartburn and nausea. Negative for diarrhea.  Neurological: Positive for headaches.  Psychiatric/Behavioral: Positive for decreased concentration and depression. The patient has insomnia.   All other systems reviewed and are negative.      Objective:   Physical Exam Vitals signs reviewed.  Constitutional:      General: She is irritable. She is not in acute distress.    Appearance: She is well-developed.  HENT:     Head: Normocephalic and atraumatic.     Right Ear: Tympanic membrane normal.     Left Ear: Tympanic membrane normal.  Eyes:     Pupils: Pupils are equal, round, and reactive to light.  Neck:     Musculoskeletal: Normal range of motion and neck supple.     Thyroid: No thyromegaly.  Cardiovascular:     Rate and Rhythm: Normal rate and regular rhythm.     Heart sounds: Normal heart sounds. No murmur.  Pulmonary:     Effort: Pulmonary effort is normal. No respiratory distress.     Breath sounds: Normal breath sounds. No wheezing.  Abdominal:     General: Bowel sounds are normal. There is no distension.     Palpations: Abdomen is soft.     Tenderness: There is no abdominal tenderness.  Musculoskeletal: Normal range of motion.        General: No tenderness.  Skin:    General: Skin is warm and dry.  Neurological:     Mental Status: She is alert and oriented to person, place, and time.     Cranial Nerves: No cranial nerve deficit.     Deep Tendon Reflexes: Reflexes are normal and symmetric.  Psychiatric:        Behavior: Behavior normal.        Thought Content: Thought content normal.        Judgment: Judgment normal.          BP (!) 159/73   Pulse (!) 58   Temp  98.9 F (37.2 C) (Temporal)   Ht 5' (1.524 m)   Wt 173 lb (78.5 kg) Comment: wearing knee braces  SpO2 96%   BMI 33.79 kg/m   Assessment & Plan:  Sharon Compton comes in today with chief complaint of follow up on falls and Hypertension   Diagnosis and orders addressed:  1. Essential hypertension - CMP14+EGFR  2. Congestive heart failure, unspecified HF chronicity, unspecified heart failure type ([place]) - CMP14+EGFR  3. Coronary artery disease involving coronary bypass graft of native heart with unstable angina pectoris (HCC) - CMP14+EGFR  4. Gastroesophageal reflux disease without esophagitis - CMP14+EGFR  5. Hypothyroidism, unspecified type Will try to see if she can tolerate Cytomel since she can not take levothyroxine and will not see endocrinologists  - CMP14+EGFR - Thyroid Panel With TSH - liothyronine (CYTOMEL) 25 MCG tablet; Take 1 tablet (25 mcg total) by mouth daily.  Dispense: 30 tablet; Refill: 1  6. Morbid obesity ([place]) - CMP14+EGFR  7. Severe episode of recurrent major depressive disorder, without psychotic features ([place]) - CMP14+EGFR  8. Other iron deficiency anemia - CMP14+EGFR - Anemia Profile B  9. Insomnia, unspecified type - CMP14+EGFR  10. Generalized anxiety disorder -  CMP14+EGFR - ToxASSURE Select 13 (MW), Urine  11. Controlled substance agreement signed - CMP14+EGFR - ToxASSURE Select 13 (MW), Urine  12. Urinary frequency - Urinalysis, Complete - Urine Culture  13. Acute sinusitis, recurrence not specified, unspecified location - amoxicillin-clavulanate (AUGMENTIN) 875-125 MG tablet; Take 1 tablet by mouth 2 (two) times daily.  Dispense: 14 tablet; Refill: 0  14. Acute post-traumatic headache, not intractable - CT Head Wo Contrast; Future   Labs pending Health Maintenance reviewed Diet and exercise encouraged  Follow up plan: 1 months    Evelina Dun, FNP

## 2019-04-08 LAB — ANEMIA PROFILE B
Basophils Absolute: 0.1 10*3/uL (ref 0.0–0.2)
Basos: 2 %
EOS (ABSOLUTE): 0 10*3/uL (ref 0.0–0.4)
Eos: 1 %
Ferritin: 65 ng/mL (ref 15–150)
Folate: 3.8 ng/mL (ref >3.0)
Hematocrit: 46.5 % (ref 34.0–46.6)
Hemoglobin: 15 g/dL (ref 11.1–15.9)
Immature Grans (Abs): 0 10*3/uL (ref 0.0–0.1)
Immature Granulocytes: 0 %
Iron Saturation: 21 % (ref 15–55)
Iron: 59 ug/dL (ref 27–139)
Lymphocytes Absolute: 1.8 10*3/uL (ref 0.7–3.1)
Lymphs: 36 %
MCH: 28.6 pg (ref 26.6–33.0)
MCHC: 32.3 g/dL (ref 31.5–35.7)
MCV: 89 fL (ref 79–97)
Monocytes Absolute: 0.3 10*3/uL (ref 0.1–0.9)
Monocytes: 6 %
Neutrophils Absolute: 2.8 10*3/uL (ref 1.4–7.0)
Neutrophils: 55 %
Platelets: 165 10*3/uL (ref 150–450)
RBC: 5.25 x10E6/uL (ref 3.77–5.28)
RDW: 15.7 % — ABNORMAL HIGH (ref 11.7–15.4)
Retic Ct Pct: 1.9 % (ref 0.6–2.6)
Total Iron Binding Capacity: 286 ug/dL (ref 250–450)
UIBC: 227 ug/dL (ref 118–369)
Vitamin B-12: 767 pg/mL (ref 232–1245)
WBC: 5.1 10*3/uL (ref 3.4–10.8)

## 2019-04-08 LAB — CMP14+EGFR
ALT: 27 IU/L (ref 0–32)
AST: 36 IU/L (ref 0–40)
Albumin/Globulin Ratio: 1.6 (ref 1.2–2.2)
Albumin: 4.1 g/dL (ref 3.7–4.7)
Alkaline Phosphatase: 100 IU/L (ref 39–117)
BUN/Creatinine Ratio: 13 (ref 12–28)
BUN: 18 mg/dL (ref 8–27)
Bilirubin Total: 0.5 mg/dL (ref 0.0–1.2)
CO2: 24 mmol/L (ref 20–29)
Calcium: 8.7 mg/dL (ref 8.7–10.3)
Chloride: 103 mmol/L (ref 96–106)
Creatinine, Ser: 1.4 mg/dL — ABNORMAL HIGH (ref 0.57–1.00)
GFR calc Af Amer: 42 mL/min/{1.73_m2} — ABNORMAL LOW (ref >59)
GFR calc non Af Amer: 36 mL/min/{1.73_m2} — ABNORMAL LOW (ref >59)
Globulin, Total: 2.5 g/dL (ref 1.5–4.5)
Glucose: 126 mg/dL — ABNORMAL HIGH (ref 65–99)
Potassium: 4 mmol/L (ref 3.5–5.2)
Sodium: 142 mmol/L (ref 134–144)
Total Protein: 6.6 g/dL (ref 6.0–8.5)

## 2019-04-08 LAB — THYROID PANEL WITH TSH
Free Thyroxine Index: 2.4 (ref 1.2–4.9)
T3 Uptake Ratio: 30 % (ref 24–39)
T4, Total: 8.1 ug/dL (ref 4.5–12.0)
TSH: 6.44 u[IU]/mL — ABNORMAL HIGH (ref 0.450–4.500)

## 2019-04-08 NOTE — Addendum Note (Signed)
Addended by: Pollyann Kennedy F on: 04/08/2019 01:23 PM   Modules accepted: Orders

## 2019-04-09 ENCOUNTER — Other Ambulatory Visit: Payer: Self-pay | Admitting: Family

## 2019-04-09 DIAGNOSIS — E039 Hypothyroidism, unspecified: Secondary | ICD-10-CM

## 2019-04-09 DIAGNOSIS — Z91199 Patient's noncompliance with other medical treatment and regimen due to unspecified reason: Secondary | ICD-10-CM

## 2019-04-09 DIAGNOSIS — Z9119 Patient's noncompliance with other medical treatment and regimen: Secondary | ICD-10-CM | POA: Insufficient documentation

## 2019-04-14 ENCOUNTER — Encounter: Payer: Self-pay | Admitting: Cardiovascular Disease

## 2019-04-14 ENCOUNTER — Telehealth (INDEPENDENT_AMBULATORY_CARE_PROVIDER_SITE_OTHER): Payer: Medicare Other | Admitting: Cardiovascular Disease

## 2019-04-14 VITALS — Ht 60.0 in | Wt 173.0 lb

## 2019-04-14 DIAGNOSIS — E785 Hyperlipidemia, unspecified: Secondary | ICD-10-CM

## 2019-04-14 DIAGNOSIS — I25118 Atherosclerotic heart disease of native coronary artery with other forms of angina pectoris: Secondary | ICD-10-CM | POA: Diagnosis not present

## 2019-04-14 DIAGNOSIS — Z955 Presence of coronary angioplasty implant and graft: Secondary | ICD-10-CM

## 2019-04-14 DIAGNOSIS — I4819 Other persistent atrial fibrillation: Secondary | ICD-10-CM

## 2019-04-14 DIAGNOSIS — I10 Essential (primary) hypertension: Secondary | ICD-10-CM

## 2019-04-14 DIAGNOSIS — I5032 Chronic diastolic (congestive) heart failure: Secondary | ICD-10-CM

## 2019-04-14 NOTE — Patient Instructions (Signed)

## 2019-04-14 NOTE — Progress Notes (Signed)
Virtual Visit via Telephone Note   This visit type was conducted due to national recommendations for restrictions regarding the COVID-19 Pandemic (e.g. social distancing) in an effort to limit this patient's exposure and mitigate transmission in our community.  Due to her co-morbid illnesses, this patient is at least at moderate risk for complications without adequate follow up.  This format is felt to be most appropriate for this patient at this time.  The patient did not have access to video technology/had technical difficulties with video requiring transitioning to audio format only (telephone).  All issues noted in this document were discussed and addressed.  No physical exam could be performed with this format.  Please refer to the patient's chart for her  consent to telehealth for [facility].   Date:  04/14/2019   ID:  Sharon Compton, DOB [birth year], MRN [id number]  Patient Location: Home Provider Location: Office  PCP:  Sharion Balloon, FNP  Cardiologist:  Kate Sable, MD  Electrophysiologist:  None   Evaluation Performed:  Follow-Up Visit  Chief Complaint:  CAD  History of Present Illness:    Sharon Compton is a 78 y.o. female with coronary artery disease, atrial fibrillation, hypertension, and chronic diastolic heart failure.  She has apriorhistory of medication noncompliance.  She refused the stress test I ordered ata priorvisit with me on 03/15/17.She tells me she was scared of the injection.  Coronary angiography on8/1/16showed mid LAD stenosis 50%,distal RCA lesion of 20%previously treatedwith a stent, RPDA lesion of 70%.  Echocardiogram on 03/10/15 was a technically difficult study but demonstrated normal left ventricular systolic function, LVEF 0000000, moderate LVH, mild left atrial dilatation, and a trivial pericardial effusion.  She had a fall over the summer and fractured her left distal radius and ulna. She follows with ortho.  She denies  chest pain.    Past Medical History:  Diagnosis Date  . A-fib [facility])    Early 2013, had TEE/DCCV at [facility]; pt reports DCCV 2014 at [facility] as well.  . Anxiety and depression   . Asthma   . 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 CAD (coronary artery disease) 02/2013   2.75 x 32 mm Rebel bare metal stent in the distal RCA.   [name] COPD (chronic obstructive pulmonary disease) ([place])   . Diverticular disease 10/01/2011  . Fibromyalgia   . GERD (gastroesophageal reflux disease)   . GI bleed    Recurrent, hx cecal AVMs, ablated 07/2011; hx PUD also  . Hiatal hernia   . History of pneumonia   . Hypertension   . Lymphedema   . MI, acute, non ST segment elevation ([place]) 10/02/2011  . Peptic ulcer disease   . Peripheral edema    Past Surgical History:  Procedure Laterality Date  . abd tumor removed     states was 10 lbs, benign  . ABDOMINAL EXPLORATION SURGERY    . ABDOMINAL HYSTERECTOMY  age 61   nonmalignant reason  . bladder stent    . CARDIAC CATHETERIZATION N/A 01/24/2015   Procedure: Left Heart Cath and Coronary Angiography;  Surgeon: Troy Sine, MD;  Location: [place] [facility] CV LAB;  Service: Cardiovascular;  Laterality: N/A;  . CARDIOVASCULAR STRESS TEST  09/2011   equivocal result, most likely low risk; pt refused the recommended cardiac cath to follow this up.  . CHOLECYSTECTOMY    . COLONOSCOPY  08/25/2011   Procedure: COLONOSCOPY;  Surgeon: Daneil Dolin, MD;  [facility];  Service: Endoscopy;  Laterality: N/A;  . COLONOSCOPY  10/03/2011   Procedure: COLONOSCOPY;  Surgeon: Daneil Dolin, MD;  [facility];  Service: Endoscopy;  Laterality: N/A;  NEEDS PHENERGAN 25 MG IV ON CALL  . COLONOSCOPY  10/04/2011   Procedure: COLONOSCOPY;  Surgeon: Daneil Dolin, MD;  Location: [facility];  Service: Endoscopy;  Laterality: N/A;  Phenergan 12.5 mg ON CALL  . CORONARY ANGIOPLASTY  02/2013  . ESOPHAGOGASTRODUODENOSCOPY  08/24/2011   Procedure:  ESOPHAGOGASTRODUODENOSCOPY (EGD);  Surgeon: Daneil Dolin, MD;  [facility];  Service: Endoscopy;  Laterality: N/A;  give phenergan 12.5mg  iv 30 mins prior to procedure  . KNEE SURGERY    . LEFT AND RIGHT HEART CATHETERIZATION WITH CORONARY ANGIOGRAM N/A 03/03/2013   Procedure: LEFT AND RIGHT HEART CATHETERIZATION WITH CORONARY ANGIOGRAM;  Surgeon: Burnell Blanks, MD;  Location: [facility] CATH LAB;  Service: Cardiovascular;  Laterality: N/A;  . PERCUTANEOUS CORONARY STENT INTERVENTION (PCI-S)  02/2013   2.75 x 32 mm Rebel bare metal stent in the distal RCA.   [name] TRANSTHORACIC ECHOCARDIOGRAM  09/2011   EF 60-65%, septal hypokinesia     Current Meds  Medication Sig  . ALPRAZolam (XANAX) 1 MG tablet Take 1 tablet (1 mg total) by mouth 3 (three) times daily.  [name] amiodarone (PACERONE) 200 MG tablet Take 1 tablet (200 mg total) by mouth daily.  [name] amoxicillin-clavulanate (AUGMENTIN) 875-125 MG tablet Take 1 tablet by mouth 2 (two) times daily.  [name] aspirin 81 MG tablet Take 81 mg by mouth daily.  [name] diltiazem (CARDIZEM CD) 240 MG 24 hr capsule TAKE 1 CAPSULE BY MOUTH DAILY  . liothyronine (CYTOMEL) 25 MCG tablet Take 1 tablet (25 mcg total) by mouth daily.  [name] losartan (COZAAR) 50 MG tablet Take 1 tablet (50 mg total) by mouth daily.  . metoprolol tartrate (LOPRESSOR) 25 MG tablet Take 0.5 tablets (12.5 mg total) by mouth 2 (two) times daily.  . nitroGLYCERIN (NITROSTAT) 0.4 MG SL tablet DISSOLVE ONE TABLET UNDER THE TONGUE EVERY 5 MINUTES AS NEEDED FOR CHEST PAIN.  DO NOT EXCEED A TOTAL OF 3 DOSES IN 15 MINUTES  . potassium chloride SA (K-DUR,KLOR-CON) 20 MEQ tablet Take 2 tablets (40 mEq total) by mouth 2 (two) times daily for 15 days.     Allergies:   Iohexol, Dye fdc red [red dye], Sulfa antibiotics, and Codeine   Social History   Tobacco Use  . Smoking status: Former Smoker    Packs/day: 1.00    Years: 15.00    Pack years: 15.00    Types: Cigarettes    Quit date: 06/26/1995     Years since quitting: 23.8  . Smokeless tobacco: Never Used  Substance Use Topics  . Alcohol use: No    Alcohol/week: 0.0 standard drinks  . Drug use: No     Family Hx: The patient's family history includes Cancer in her father; Coronary artery disease in her mother; Diabetes in her mother; Heart failure in her mother; Hypertension in her mother. There is no history of Colon cancer.  ROS:   Please see the history of present illness.     All other systems reviewed and are negative.   Prior CV studies:   The following studies were reviewed today:  Reviewed above  Labs/Other Tests and Data Reviewed:    EKG:  No ECG reviewed.  Recent Labs: 04/07/2019: ALT 27; BUN 18; Creatinine, Ser 1.40; Hemoglobin 15.0; Platelets 165; Potassium 4.0; Sodium 142; TSH 6.440  Recent Lipid Panel Lab Results  Component Value Date/Time   CHOL 158 03/19/2018 12:28 PM   TRIG 126 03/19/2018 12:28 PM   TRIG 159 (H) 07/29/2014 04:29 PM   HDL 44 03/19/2018 12:28 PM   HDL 44 07/29/2014 04:29 PM   CHOLHDL 3.6 03/19/2018 12:28 PM   CHOLHDL 4.7 11/03/2013 06:25 PM   LDLCALC 89 03/19/2018 12:28 PM   LDLCALC 215 (H) 03/19/2014 03:03 PM    Wt Readings from Last 3 Encounters:  04/14/19 173 lb (78.5 kg)  04/07/19 173 lb (78.5 kg)  12/10/18 178 lb (80.7 kg)     Objective:    Vital Signs:  Ht 5' (1.524 m)   Wt 173 lb (78.5 kg)   BMI 33.79 kg/m    VITAL SIGNS:  reviewed  ASSESSMENT & PLAN:    1.Chronic diastolic heart failure:Symptomatically stable.  She stopped taking torsemide on her own due to increased urinary frequency.  I instructed her to not take potassium if she is not taking torsemide.  2.Persistent atrial fibrillation/flutter:Symptomatically stable. Not a candidate for anticoagulation for reasons mentioned above. Continue amiodarone, Cardizem CD 240 mg daily and metoprolol 12.5 mg twice daily.She also takeslong-acting diltiazem 240 mg daily as well.TSH and liver  transaminases were mildly elevated on 04/07/19. Referred to endocrinology by PCP.  3. CAD with RCA stent:Symptomatically stable. She previously refused to undergo the Lexiscan Myoview stress testI orderedto see if there has been progression of mid LAD disease.Continue ASA and metoprolol.  She stopped Crestor on her own.  4.Accelerated hypertension:This will need further monitoring.  5. Hyperlipidemia: LDL 89 in 02/2018.  She stopped Crestor on her own.    COVID-19 Education: The signs and symptoms of COVID-19 were discussed with the patient and how to seek care for testing (follow up with PCP or arrange E-visit).  The importance of social distancing was discussed today.  Time:   Today, I have spent 15 minutes with the patient with telehealth technology discussing the above problems.     Medication Adjustments/Labs and Tests Ordered: Current medicines are reviewed at length with the patient today.  Concerns regarding medicines are outlined above.   Tests Ordered: No orders of the defined types were placed in this encounter.   Medication Changes: No orders of the defined types were placed in this encounter.   Follow Up:  Either In Person or Virtual in 6 month(s)  Signed, Kate Sable, MD  04/14/2019 11:29 AM    [place]

## 2019-04-19 ENCOUNTER — Other Ambulatory Visit: Payer: Self-pay | Admitting: Cardiovascular Disease

## 2019-04-23 ENCOUNTER — Ambulatory Visit (HOSPITAL_COMMUNITY): Payer: Medicare Other

## 2019-05-06 ENCOUNTER — Ambulatory Visit (HOSPITAL_COMMUNITY): Payer: Medicare Other

## 2019-05-13 ENCOUNTER — Ambulatory Visit: Payer: Medicare Other | Admitting: Family

## 2019-05-26 ENCOUNTER — Telehealth: Payer: Self-pay | Admitting: Orthopedic Surgery

## 2019-05-26 NOTE — Telephone Encounter (Signed)
Called patient to notify and to schedule virtual - left message to return call.

## 2019-05-26 NOTE — Telephone Encounter (Signed)
To Dr Aline Brochure,

## 2019-05-26 NOTE — Telephone Encounter (Signed)
Patient called to re-schedule appointment with Dr Aline Brochure - last visits, virtual. Offered in-person appointment, as patient mentioned needing another brace. Please advise if can have another appointment virtually, due to other medical issues with patient, and also now with husband.

## 2019-05-26 NOTE — Telephone Encounter (Signed)
Ok to virtual visit

## 2019-05-27 ENCOUNTER — Telehealth: Payer: Self-pay | Admitting: Orthopedic Surgery

## 2019-05-27 NOTE — Telephone Encounter (Signed)
Patient is aware of re-scheduled appointment (virtual - okay per Dr Aline Brochure) - she is asking for another brace like she had, as hers is worn out. Please advise.

## 2019-05-27 NOTE — Telephone Encounter (Signed)
Called back to patient; reached; scheduled accordingly.

## 2019-05-28 ENCOUNTER — Other Ambulatory Visit: Payer: Self-pay | Admitting: Cardiovascular Disease

## 2019-05-28 DIAGNOSIS — E785 Hyperlipidemia, unspecified: Secondary | ICD-10-CM

## 2019-05-28 NOTE — Telephone Encounter (Signed)
No, she has to sign for it so we can file with insurance. Same as when she is in office, she will have to sign permission to file insurance and privacy statement for the brace. She has to come in, and if she is sick she will have to wait.

## 2019-05-28 NOTE — Telephone Encounter (Signed)
Why does she want virtual visit and to come in and pick up brace? Is she sick? If she is, she can come in when she is well and pick up new brace.

## 2019-05-28 NOTE — Telephone Encounter (Signed)
I discussed that with patient, as she mentioned this brace request After we scheduled the virtual visit per Dr Ruthe Mannan approval for virtual. She did ask if it was possible to receive it by mail?

## 2019-05-29 NOTE — Telephone Encounter (Signed)
Called back to patient (x2) to relay updated message; reached voice mail, left message.

## 2019-06-01 ENCOUNTER — Ambulatory Visit (INDEPENDENT_AMBULATORY_CARE_PROVIDER_SITE_OTHER): Payer: Medicare Other | Admitting: Family

## 2019-06-01 NOTE — Progress Notes (Signed)
Attempted to call patient at 1:10 pm & 1:15 pm , no answer. Unable to leave VM. Will close chart out.

## 2019-06-02 ENCOUNTER — Other Ambulatory Visit: Payer: Self-pay | Admitting: Family

## 2019-06-02 DIAGNOSIS — I10 Essential (primary) hypertension: Secondary | ICD-10-CM

## 2019-06-02 NOTE — Telephone Encounter (Signed)
Called back to patient - again unable to reach, left message.

## 2019-06-04 ENCOUNTER — Other Ambulatory Visit: Payer: Self-pay | Admitting: Family

## 2019-06-04 DIAGNOSIS — I10 Essential (primary) hypertension: Secondary | ICD-10-CM

## 2019-06-05 ENCOUNTER — Telehealth: Payer: Self-pay | Admitting: Orthopedic Surgery

## 2019-06-05 ENCOUNTER — Other Ambulatory Visit: Payer: Self-pay

## 2019-06-05 ENCOUNTER — Encounter: Payer: Self-pay | Admitting: Family

## 2019-06-05 ENCOUNTER — Ambulatory Visit (INDEPENDENT_AMBULATORY_CARE_PROVIDER_SITE_OTHER): Payer: Medicare Other | Admitting: Family

## 2019-06-05 ENCOUNTER — Ambulatory Visit: Payer: Medicare Other | Admitting: Orthopedic Surgery

## 2019-06-05 DIAGNOSIS — R296 Repeated falls: Secondary | ICD-10-CM

## 2019-06-05 DIAGNOSIS — I25700 Atherosclerosis of coronary artery bypass graft(s), unspecified, with unstable angina pectoris: Secondary | ICD-10-CM | POA: Diagnosis not present

## 2019-06-05 DIAGNOSIS — Z79899 Other long term (current) drug therapy: Secondary | ICD-10-CM

## 2019-06-05 DIAGNOSIS — F411 Generalized anxiety disorder: Secondary | ICD-10-CM

## 2019-06-05 MED ORDER — ALPRAZOLAM 0.5 MG PO TABS: 0.5000 mg | ORAL_TABLET | Freq: Three times a day (TID) | ORAL | 0 refills | Status: DC | PRN

## 2019-06-05 NOTE — Telephone Encounter (Signed)
Follow-up left message for virtual visit follow-up

## 2019-06-05 NOTE — Telephone Encounter (Signed)
Called back to patient, again reached voice mail and left message; reminded of virtual visit for today, 06/05/19; relayed per note that if she needs replacement brace, in-person visit is needed.

## 2019-06-05 NOTE — Progress Notes (Signed)
Virtual Visit via telephone Note Due to COVID-19 pandemic this visit was conducted virtually. This visit type was conducted due to national recommendations for restrictions regarding the COVID-19 Pandemic (e.g. social distancing, sheltering in place) in an effort to limit this patient's exposure and mitigate transmission in our community. All issues noted in this document were discussed and addressed.  A physical exam was not performed with this format.  I connected with Sharon Compton on 06/05/19 at 4:20 pm by telephone and verified that I am speaking with the correct person using two identifiers. Sharon Compton is currently located at home and no one is currently with her during visit. The provider, Evelina Dun, FNP is located in their office at time of visit.  I discussed the limitations, risks, security and privacy concerns of performing an evaluation and management service by telephone and the availability of in person appointments. I also discussed with the patient that there may be a patient responsible charge related to this service. The patient expressed understanding and agreed to proceed.   History and Present Illness:  She reports she has fallen three times in the last year. She is taking Xanax 1 mg. She states this has nothing to do with her xanax, but with her Crestor 40 mg because it is making her weak.   She reports she has a headache every day. We ordered a CT head, but she never got this completed. She went to the ED for four hours and states she left.  Fall The accident occurred 2 days ago. The fall occurred while walking. She fell from a height of 1 to 2 ft. She landed on carpet. The point of impact was the head, left knee and right knee. The pain is present in the face, left knee and right knee. The pain is at a severity of 9/10. The pain is moderate. Pertinent negatives include no hearing loss, hematuria or loss of consciousness.       Review of Systems    Genitourinary: Negative for hematuria.  Neurological: Negative for loss of consciousness.     Observations/Objective: No SOB or distress noted  Assessment and Plan: 1. Generalized anxiety disorder  2. Controlled substance agreement signed  3. Multiple falls  Long discussion with patient that we would have to tamper her xanax. She is not happy. I have decreased her dose to 0.5 mg TID from 1 mg. She states she is going to stop her Crestor because this is what is causing her dizziness and weakness.  We will still tamper the xanax with the goal of coming off it. Currently no memory changes, gait changes, vision changes      I discussed the assessment and treatment plan with the patient. The patient was provided an opportunity to ask questions and all were answered. The patient agreed with the plan and demonstrated an understanding of the instructions.   The patient was advised to call back or seek an in-person evaluation if the symptoms worsen or if the condition fails to improve as anticipated.  The above assessment and management plan was discussed with the patient. The patient verbalized understanding of and has agreed to the management plan. Patient is aware to call the clinic if symptoms persist or worsen. Patient is aware when to return to the clinic for a follow-up visit. Patient educated on when it is appropriate to go to the emergency department.   Time call ended:  4:49 pm  I provided 29 minutes of non-face-to-face time  during this encounter.    Evelina Dun, FNP

## 2019-06-15 ENCOUNTER — Ambulatory Visit (INDEPENDENT_AMBULATORY_CARE_PROVIDER_SITE_OTHER): Payer: Medicare Other | Admitting: *Deleted

## 2019-06-15 VITALS — BP 150/70 | Ht 60.0 in | Wt 173.0 lb

## 2019-06-15 DIAGNOSIS — Z0000 Encounter for general adult medical examination without abnormal findings: Secondary | ICD-10-CM | POA: Diagnosis not present
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
## 2019-06-15 NOTE — Patient Instructions (Signed)
  Sharon Compton , Thank you for taking time to come for your Medicare Wellness Visit. I appreciate your ongoing commitment to your health goals. Please review the following plan we discussed and let me know if I can assist you in the future.   These are the goals we discussed: Goals    . Patient Stated     Call [facility]- contact information on list given at appointment today.  Schedule appointment to discuss depression issues.         This is a list of the screening recommended for you and due dates:  Health Maintenance  Topic Date Due  . Tetanus Vaccine  01[birth year]20/1960  . DEXA scan (bone density measurement)  01/19/2006  . Flu Shot  11/13/2019*  . Pneumonia vaccines  Completed  *Topic was postponed. The date shown is not the original due date.

## 2019-07-02 ENCOUNTER — Telehealth: Payer: Self-pay | Admitting: Family

## 2019-07-02 NOTE — Telephone Encounter (Signed)
[place], thanks. We had a long discussion last visit. She has had multiple falls and I can no longer prescribe the xanax for her. We are tampering off medication. If she is not happy she is more than welcome to find a new provider.

## 2019-07-02 NOTE — Telephone Encounter (Signed)
Patient called in very upset about the decrease in her alprazolam. Patient was angry that it had been decreased and states that we should have never decreased it. Patient advised to schedule an appointment to discuss. Patient states that she was going to take 2 of the 0.5mg  three times a day to equal what she was taking. I advised patient if she done that she would be breaking her controlled substance agreement.

## 2019-07-04 ENCOUNTER — Other Ambulatory Visit: Payer: Self-pay | Admitting: Family

## 2019-07-08 ENCOUNTER — Other Ambulatory Visit: Payer: Self-pay | Admitting: Family

## 2019-07-08 ENCOUNTER — Telehealth: Payer: Self-pay | Admitting: Cardiovascular Disease

## 2019-07-08 MED ORDER — AMIODARONE HCL 200 MG PO TABS: 200.0000 mg | ORAL_TABLET | Freq: Every day | ORAL | 3 refills | Status: DC

## 2019-07-08 NOTE — Telephone Encounter (Signed)
Please give pt a call to discuss medications

## 2019-07-08 NOTE — Telephone Encounter (Signed)
Needed refill on her Amiodarone - refill sent to [facility] now.

## 2019-07-08 NOTE — Telephone Encounter (Signed)
What is the name of the medication? amiodarone  Have you contacted your pharmacy to request a refill? Yes they have faxed over request several times [facility] has given her one and she is out  Which pharmacy would you like this sent to? [facility]    Patient notified that their request is being sent to the clinical staff for review and that they should receive a call once it is complete. If they do not receive a call within 24 hours they can check with their pharmacy or our office.    Also does not understand why Alyse Low is refilling meds that heart doctor should be?

## 2019-07-10 ENCOUNTER — Other Ambulatory Visit: Payer: Self-pay

## 2019-07-13 ENCOUNTER — Ambulatory Visit (INDEPENDENT_AMBULATORY_CARE_PROVIDER_SITE_OTHER): Payer: Medicare Other | Admitting: Family

## 2019-07-13 ENCOUNTER — Telehealth: Payer: Self-pay | Admitting: Family

## 2019-07-13 ENCOUNTER — Encounter: Payer: Self-pay | Admitting: Family

## 2019-07-13 ENCOUNTER — Other Ambulatory Visit: Payer: Self-pay

## 2019-07-13 ENCOUNTER — Ambulatory Visit: Payer: Medicare Other | Admitting: Family

## 2019-07-13 ENCOUNTER — Other Ambulatory Visit: Payer: Self-pay | Admitting: Family

## 2019-07-13 VITALS — BP 168/80 | HR 54 | Temp 97.5°F | Ht 60.0 in | Wt 170.2 lb

## 2019-07-13 DIAGNOSIS — I509 Heart failure, unspecified: Secondary | ICD-10-CM

## 2019-07-13 DIAGNOSIS — I10 Essential (primary) hypertension: Secondary | ICD-10-CM | POA: Diagnosis not present

## 2019-07-13 DIAGNOSIS — F411 Generalized anxiety disorder: Secondary | ICD-10-CM

## 2019-07-13 DIAGNOSIS — I2510 Atherosclerotic heart disease of native coronary artery without angina pectoris: Secondary | ICD-10-CM

## 2019-07-13 DIAGNOSIS — Z9119 Patient's noncompliance with other medical treatment and regimen: Secondary | ICD-10-CM

## 2019-07-13 DIAGNOSIS — F332 Major depressive disorder, recurrent severe without psychotic features: Secondary | ICD-10-CM

## 2019-07-13 DIAGNOSIS — D508 Other iron deficiency anemias: Secondary | ICD-10-CM | POA: Diagnosis not present

## 2019-07-13 DIAGNOSIS — E039 Hypothyroidism, unspecified: Secondary | ICD-10-CM | POA: Diagnosis not present

## 2019-07-13 DIAGNOSIS — Z91199 Patient's noncompliance with other medical treatment and regimen due to unspecified reason: Secondary | ICD-10-CM

## 2019-07-13 DIAGNOSIS — Z79899 Other long term (current) drug therapy: Secondary | ICD-10-CM

## 2019-07-13 DIAGNOSIS — I25700 Atherosclerosis of coronary artery bypass graft(s), unspecified, with unstable angina pectoris: Secondary | ICD-10-CM

## 2019-07-13 MED ORDER — ALPRAZOLAM 0.5 MG PO TABS: 0.5000 mg | ORAL_TABLET | Freq: Three times a day (TID) | ORAL | 0 refills | Status: DC | PRN

## 2019-07-13 NOTE — Progress Notes (Signed)
Subjective:    Patient ID: Sharon Compton, female    DOB: September 08, 1940, 79 y.o.   MRN: [id number]  Chief Complaint  Patient presents with  . Medical Management of Chronic Issues   Pt presents to the office today for chronic follow up.  She is followed by Cardiologists every 6 months for CAD, A Fib, and CHF. She has seen Hematologists for iron deficiency in the past, but has been stable.  She states she can not take her levothyroxine because it makes it sick.She refused her Endocrinologists.She has refuses behavorial health appt, because they "called the law" on her.   Last visit, we decreased her Xanax to 0.5 mg from 1 mg because of falls. She states she was having shakiness and called her cardiologists who told her to increase her xanax back to 1 mg. (per patient, I do not see this in Epic). She states she has tried all the depression medications and they do not work for her. She has been two days without her xanax.   Hypertension This is a chronic problem. The current episode started more than 1 year ago. The problem has been waxing and waning since onset. The problem is uncontrolled. Associated symptoms include anxiety and malaise/fatigue. Pertinent negatives include no peripheral edema or shortness of breath. The current treatment provides mild improvement. Hypertensive end-organ damage includes CAD/MI and heart failure. Identifiable causes of hypertension include a thyroid problem.  Hyperlipidemia This is a chronic problem. The current episode started more than 1 year ago. The problem is uncontrolled. Recent lipid tests were reviewed and are high. Exacerbating diseases include hypothyroidism. Pertinent negatives include no shortness of breath. Current antihyperlipidemic treatment includes statins. The current treatment provides moderate improvement of lipids. Risk factors for coronary artery disease include dyslipidemia, hypertension, post-menopausal and a sedentary lifestyle.   Anxiety Presents for follow-up visit. Symptoms include depressed mood, excessive worry, irritability and nervous/anxious behavior. Patient reports no confusion or shortness of breath. Symptoms occur occasionally. The severity of symptoms is moderate.   Her past medical history is significant for anemia.  Congestive Heart Failure Presents for follow-up visit. Associated symptoms include edema and fatigue. Pertinent negatives include no shortness of breath. The symptoms have been stable.  Thyroid Problem Presents for follow-up visit. Symptoms include anxiety, depressed mood and fatigue. The symptoms have been worsening. Her past medical history is significant for heart failure and hyperlipidemia.  Anemia Presents for follow-up visit. Symptoms include malaise/fatigue. There has been no bruising/bleeding easily or confusion. Past medical history includes heart failure and hypothyroidism.      Review of Systems  Constitutional: Positive for fatigue, irritability and malaise/fatigue.  Respiratory: Negative for shortness of breath.   Hematological: Does not bruise/bleed easily.  Psychiatric/Behavioral: Negative for confusion. The patient is nervous/anxious.   All other systems reviewed and are negative.      Objective:   Physical Exam Vitals reviewed.  Constitutional:      General: She is not in acute distress.    Appearance: She is well-developed.  HENT:     Head: Normocephalic and atraumatic.     Right Ear: External ear normal.  Eyes:     Pupils: Pupils are equal, round, and reactive to light.  Neck:     Thyroid: No thyromegaly.  Cardiovascular:     Rate and Rhythm: Normal rate and regular rhythm.     Heart sounds: Murmur present.  Pulmonary:     Effort: Pulmonary effort is normal. No respiratory distress.  Breath sounds: Normal breath sounds. No wheezing.  Abdominal:     General: Bowel sounds are normal. There is no distension.     Palpations: Abdomen is soft.      Tenderness: There is no abdominal tenderness.  Musculoskeletal:        General: No tenderness. Normal range of motion.     Cervical back: Normal range of motion and neck supple.     Right lower leg: Edema (trace) present.     Left lower leg: Edema (trace) present.  Skin:    General: Skin is warm and dry.  Neurological:     Mental Status: She is alert and oriented to person, place, and time.     Cranial Nerves: No cranial nerve deficit.     Deep Tendon Reflexes: Reflexes are normal and symmetric.  Psychiatric:        Behavior: Behavior normal.        Thought Content: Thought content normal.        Judgment: Judgment normal.       BP (!) 168/80   Pulse (!) 54   Temp (!) 97.5 F (36.4 C) (Temporal)   Ht 5' (1.524 m)   Wt 170 lb 3.2 oz (77.2 kg)   SpO2 96%   BMI 33.24 kg/m      Assessment & Plan:  Sharon Compton comes in today with chief complaint of Medical Management of Chronic Issues   Diagnosis and orders addressed:  1. Coronary artery disease involving native coronary artery of native heart without angina pectoris - CMP14+EGFR - CBC with Differential/Platelet  2. Essential hypertension - CMP14+EGFR - CBC with Differential/Platelet  3. Coronary artery disease involving coronary bypass graft of native heart with unstable angina pectoris (HCC) - CMP14+EGFR - CBC with Differential/Platelet - Lipid panel  4. Congestive heart failure, unspecified HF chronicity, unspecified heart failure type ([place]) - CMP14+EGFR - CBC with Differential/Platelet  5. Hypothyroidism, unspecified type - CMP14+EGFR - CBC with Differential/Platelet - TSH  6. Generalized anxiety disorder - CMP14+EGFR - CBC with Differential/Platelet  7. Morbid obesity ([place]) - CMP14+EGFR - CBC with Differential/Platelet  8. Severe episode of recurrent major depressive disorder, without psychotic features ([place]) - CMP14+EGFR - CBC with Differential/Platelet  9. Other iron deficiency anemia -  CMP14+EGFR - CBC with Differential/Platelet  10. Controlled substance agreement signed - CMP14+EGFR - CBC with Differential/Platelet - ToxASSURE Select 13 (MW), Urine  11. Noncompliance - CMP14+EGFR - CBC with Differential/Platelet  Long discussion with patient that we could not continue xanax. I will give her one more month to decrease xanax to 0.5 mg TID from 1 mg TID. Discussed we could not continue medication because of age and frequent falls.  Labs pending Health Maintenance reviewed Diet and exercise encouraged  Follow up plan: 1 month to continue to decrease xanax    Evelina Dun, FNP

## 2019-07-13 NOTE — Telephone Encounter (Signed)
Refill was sent and confirmed by pharmacy at 1150am 07/13/2019

## 2019-07-13 NOTE — Patient Instructions (Signed)

## 2019-07-14 LAB — CMP14+EGFR
ALT: 56 IU/L — ABNORMAL HIGH (ref 0–32)
AST: 65 IU/L — ABNORMAL HIGH (ref 0–40)
Albumin/Globulin Ratio: 1.4 (ref 1.2–2.2)
Albumin: 4 g/dL (ref 3.7–4.7)
Alkaline Phosphatase: 90 IU/L (ref 39–117)
BUN/Creatinine Ratio: 11 — ABNORMAL LOW (ref 12–28)
BUN: 13 mg/dL (ref 8–27)
Bilirubin Total: 0.5 mg/dL (ref 0.0–1.2)
CO2: 28 mmol/L (ref 20–29)
Calcium: 9 mg/dL (ref 8.7–10.3)
Chloride: 102 mmol/L (ref 96–106)
Creatinine, Ser: 1.23 mg/dL — ABNORMAL HIGH (ref 0.57–1.00)
GFR calc Af Amer: 49 mL/min/{1.73_m2} — ABNORMAL LOW (ref >59)
GFR calc non Af Amer: 42 mL/min/{1.73_m2} — ABNORMAL LOW (ref >59)
Globulin, Total: 2.8 g/dL (ref 1.5–4.5)
Glucose: 115 mg/dL — ABNORMAL HIGH (ref 65–99)
Potassium: 3.3 mmol/L — ABNORMAL LOW (ref 3.5–5.2)
Sodium: 145 mmol/L — ABNORMAL HIGH (ref 134–144)
Total Protein: 6.8 g/dL (ref 6.0–8.5)

## 2019-07-14 LAB — LIPID PANEL
Chol/HDL Ratio: 3.1 ratio (ref 0.0–4.4)
Cholesterol, Total: 157 mg/dL (ref 100–199)
HDL: 50 mg/dL (ref >39)
LDL Chol Calc (NIH): 91 mg/dL (ref 0–99)
Triglycerides: 85 mg/dL (ref 0–149)
VLDL Cholesterol Cal: 16 mg/dL (ref 5–40)

## 2019-07-14 LAB — CBC WITH DIFFERENTIAL/PLATELET
Basophils Absolute: 0.1 10*3/uL (ref 0.0–0.2)
Basos: 2 %
EOS (ABSOLUTE): 0 10*3/uL (ref 0.0–0.4)
Eos: 1 %
Hematocrit: 46 % (ref 34.0–46.6)
Hemoglobin: 14.9 g/dL (ref 11.1–15.9)
Immature Grans (Abs): 0 10*3/uL (ref 0.0–0.1)
Immature Granulocytes: 0 %
Lymphocytes Absolute: 1.8 10*3/uL (ref 0.7–3.1)
Lymphs: 31 %
MCH: 28.5 pg (ref 26.6–33.0)
MCHC: 32.4 g/dL (ref 31.5–35.7)
MCV: 88 fL (ref 79–97)
Monocytes Absolute: 0.4 10*3/uL (ref 0.1–0.9)
Monocytes: 8 %
Neutrophils Absolute: 3.5 10*3/uL (ref 1.4–7.0)
Neutrophils: 58 %
Platelets: 148 10*3/uL — ABNORMAL LOW (ref 150–450)
RBC: 5.23 x10E6/uL (ref 3.77–5.28)
RDW: 15.8 % — ABNORMAL HIGH (ref 11.7–15.4)
WBC: 5.9 10*3/uL (ref 3.4–10.8)

## 2019-07-14 LAB — TSH: TSH: 7.9 u[IU]/mL — ABNORMAL HIGH (ref 0.450–4.500)

## 2019-07-17 ENCOUNTER — Other Ambulatory Visit: Payer: Self-pay | Admitting: Family

## 2019-07-21 ENCOUNTER — Encounter: Payer: Self-pay | Admitting: Family

## 2019-07-21 ENCOUNTER — Ambulatory Visit (INDEPENDENT_AMBULATORY_CARE_PROVIDER_SITE_OTHER): Payer: Medicare Other | Admitting: Family

## 2019-07-21 ENCOUNTER — Other Ambulatory Visit: Payer: Self-pay | Admitting: Family

## 2019-07-21 DIAGNOSIS — F410 Panic disorder [episodic paroxysmal anxiety] without agoraphobia: Secondary | ICD-10-CM

## 2019-07-21 DIAGNOSIS — I25700 Atherosclerosis of coronary artery bypass graft(s), unspecified, with unstable angina pectoris: Secondary | ICD-10-CM | POA: Diagnosis not present

## 2019-07-21 DIAGNOSIS — F411 Generalized anxiety disorder: Secondary | ICD-10-CM | POA: Diagnosis not present

## 2019-07-21 DIAGNOSIS — F4321 Adjustment disorder with depressed mood: Secondary | ICD-10-CM | POA: Diagnosis not present

## 2019-07-21 MED ORDER — LIOTHYRONINE SODIUM 50 MCG PO TABS: 50.0000 ug | ORAL_TABLET | Freq: Every day | ORAL | 1 refills | Status: DC

## 2019-07-21 MED ORDER — ALPRAZOLAM 0.5 MG PO TABS: 0.5000 mg | ORAL_TABLET | Freq: Three times a day (TID) | ORAL | 2 refills | Status: DC | PRN

## 2019-07-21 NOTE — Progress Notes (Signed)
   Virtual Visit via telephone Note Due to COVID-19 pandemic this visit was conducted virtually. This visit type was conducted due to national recommendations for restrictions regarding the COVID-19 Pandemic (e.g. social distancing, sheltering in place) in an effort to limit this patient's exposure and mitigate transmission in our community. All issues noted in this document were discussed and addressed.  A physical exam was not performed with this format.  I connected with Sharon Compton on 07/21/19 at 11:57 AM by telephone and verified that I am speaking with the correct person using two identifiers. Sharon Compton is currently located at home and no one is currently with her during visit. The provider, Evelina Dun, FNP is located in their office at time of visit.  I discussed the limitations, risks, security and privacy concerns of performing an evaluation and management service by telephone and the availability of in person appointments. I also discussed with the patient that there may be a patient responsible charge related to this service. The patient expressed understanding and agreed to proceed.   History and Present Illness:  HPI PT calls the office crying today. States her husband of 41 years was in a car accident last night and was killed.  She has uncontrolled anxiety. She reports she needs her xanax refilled. This was refilled on 07/13/19 but reports her pharmacy said they never received her script. Pt reviewed in [place] controlled database, no red flags and she did not pick up rx on 07/13/19.  Review of Systems  Psychiatric/Behavioral: Positive for depression. The patient is nervous/anxious.   All other systems reviewed and are negative.    Observations/Objective: Pt crying and anxous  Assessment and Plan: 1. GAD (generalized anxiety disorder) - ALPRAZolam (XANAX) 0.5 MG tablet; Take 1 tablet (0.5 mg total) by mouth 3 (three) times daily as needed for anxiety.  Dispense: 90  tablet; Refill: 2  2. Grief - ALPRAZolam (XANAX) 0.5 MG tablet; Take 1 tablet (0.5 mg total) by mouth 3 (three) times daily as needed for anxiety.  Dispense: 90 tablet; Refill: 2  3. Panic attack - ALPRAZolam (XANAX) 0.5 MG tablet; Take 1 tablet (0.5 mg total) by mouth 3 (three) times daily as needed for anxiety.  Dispense: 90 tablet; Refill: 2  Pt reviewed in  controlled database no red flags Will refill xanax today. Told if pharmacy does not get rx to call out office Pt refuses grief counseling  at this time. States she has her children, grandchildren.     I discussed the assessment and treatment plan with the patient. The patient was provided an opportunity to ask questions and all were answered. The patient agreed with the plan and demonstrated an understanding of the instructions.   The patient was advised to call back or seek an in-person evaluation if the symptoms worsen or if the condition fails to improve as anticipated.  The above assessment and management plan was discussed with the patient. The patient verbalized understanding of and has agreed to the management plan. Patient is aware to call the clinic if symptoms persist or worsen. Patient is aware when to return to the clinic for a follow-up visit. Patient educated on when it is appropriate to go to the emergency department.   Time call ended:  12:07 pm  I provided 10 minutes of non-face-to-face time during this encounter.    Evelina Dun, FNP

## 2019-08-13 ENCOUNTER — Ambulatory Visit: Payer: Medicare Other | Admitting: Family

## 2019-08-25 ENCOUNTER — Other Ambulatory Visit: Payer: Self-pay | Admitting: Family

## 2019-08-25 DIAGNOSIS — I10 Essential (primary) hypertension: Secondary | ICD-10-CM

## 2019-10-13 ENCOUNTER — Other Ambulatory Visit: Payer: Self-pay | Admitting: Family

## 2019-10-13 DIAGNOSIS — F411 Generalized anxiety disorder: Secondary | ICD-10-CM

## 2019-10-13 DIAGNOSIS — F4321 Adjustment disorder with depressed mood: Secondary | ICD-10-CM

## 2019-10-13 DIAGNOSIS — F410 Panic disorder [episodic paroxysmal anxiety] without agoraphobia: Secondary | ICD-10-CM

## 2019-10-22 ENCOUNTER — Other Ambulatory Visit: Payer: Self-pay | Admitting: Cardiovascular Disease

## 2019-11-09 ENCOUNTER — Other Ambulatory Visit: Payer: Self-pay | Admitting: Family

## 2019-11-09 DIAGNOSIS — I10 Essential (primary) hypertension: Secondary | ICD-10-CM

## 2019-11-13 ENCOUNTER — Telehealth: Payer: Self-pay | Admitting: Family

## 2019-11-13 ENCOUNTER — Other Ambulatory Visit: Payer: Self-pay | Admitting: Family

## 2019-11-13 DIAGNOSIS — F410 Panic disorder [episodic paroxysmal anxiety] without agoraphobia: Secondary | ICD-10-CM

## 2019-11-13 DIAGNOSIS — F411 Generalized anxiety disorder: Secondary | ICD-10-CM

## 2019-11-13 DIAGNOSIS — F4321 Adjustment disorder with depressed mood: Secondary | ICD-10-CM

## 2019-11-13 MED ORDER — ALPRAZOLAM 0.5 MG PO TABS: 0.5000 mg | ORAL_TABLET | Freq: Three times a day (TID) | ORAL | 0 refills | Status: DC | PRN

## 2019-11-13 NOTE — Telephone Encounter (Signed)
  Prescription Request  11/13/2019  What is the name of the medication or equipment? ALPRAZolam (XANAX) 0.5 MG tablet    Have you contacted your pharmacy to request a refill? (if applicable) yes  Which pharmacy would you like this sent to? [facility] [place] please call pt back    Patient notified that their request is being sent to the clinical staff for review and that they should receive a response within 2 business days.

## 2019-11-13 NOTE — Telephone Encounter (Signed)
Due for a six month recheck at end of June for this medication. Please advise on refill.

## 2019-11-13 NOTE — Telephone Encounter (Signed)
Prescription sent to pharmacy.

## 2019-11-13 NOTE — Telephone Encounter (Signed)
Christy,  Please resend Xanax. The Rx needs to go to [facility] instead of [facility].

## 2019-11-13 NOTE — Telephone Encounter (Signed)
Please send to [place]

## 2019-11-13 NOTE — Telephone Encounter (Signed)
  Prescription Request  11/13/2019  What is the name of the medication or equipment? Pt [facility]  Have you contacted your pharmacy to request a refill? (if applicable) yes  [facility]   Patient notified that their request is being sent to the clinical staff for review and that they should receive a response within 2 business days.

## 2019-11-13 NOTE — Telephone Encounter (Signed)
Xanax Prescription sent to pharmacy  ° °

## 2019-11-20 ENCOUNTER — Other Ambulatory Visit: Payer: Self-pay | Admitting: *Deleted

## 2019-11-20 ENCOUNTER — Other Ambulatory Visit: Payer: Self-pay | Admitting: Family

## 2019-11-20 DIAGNOSIS — I10 Essential (primary) hypertension: Secondary | ICD-10-CM

## 2019-11-20 MED ORDER — METOPROLOL TARTRATE 25 MG PO TABS: ORAL_TABLET | ORAL | 0 refills | Status: DC

## 2019-12-11 ENCOUNTER — Other Ambulatory Visit: Payer: Self-pay | Admitting: Family

## 2019-12-11 DIAGNOSIS — F410 Panic disorder [episodic paroxysmal anxiety] without agoraphobia: Secondary | ICD-10-CM

## 2019-12-11 DIAGNOSIS — F411 Generalized anxiety disorder: Secondary | ICD-10-CM

## 2019-12-11 DIAGNOSIS — F4321 Adjustment disorder with depressed mood: Secondary | ICD-10-CM

## 2019-12-14 ENCOUNTER — Other Ambulatory Visit: Payer: Self-pay | Admitting: Family

## 2019-12-14 DIAGNOSIS — F411 Generalized anxiety disorder: Secondary | ICD-10-CM

## 2019-12-14 DIAGNOSIS — F4321 Adjustment disorder with depressed mood: Secondary | ICD-10-CM

## 2019-12-14 DIAGNOSIS — F410 Panic disorder [episodic paroxysmal anxiety] without agoraphobia: Secondary | ICD-10-CM

## 2019-12-18 ENCOUNTER — Telehealth: Payer: Self-pay | Admitting: Family

## 2019-12-18 NOTE — Telephone Encounter (Signed)
Pt called requesting refills on her xanax Rx and potassium Rx. Explained to pt that she needed to make an appt for in person visit for med refill because she hasn't had an appt since Jan 2021 and [facility] wouldn't be able to fill the Xanax Rx until she was seen in person because it is a controlled Rx. Pt said its hard for her to get transportation. I voiced understanding but again explained to her that due to [facility], no refills are to be given for controlled meds without an appt. Pt did not schedule appt.

## 2019-12-20 ENCOUNTER — Encounter (HOSPITAL_COMMUNITY): Payer: Self-pay | Admitting: *Deleted

## 2019-12-20 ENCOUNTER — Other Ambulatory Visit: Payer: Self-pay

## 2019-12-20 ENCOUNTER — Emergency Department (HOSPITAL_COMMUNITY): Payer: Medicare Other

## 2019-12-20 ENCOUNTER — Emergency Department (HOSPITAL_COMMUNITY)
Admission: EM | Admit: 2019-12-20 | Discharge: 2019-12-21 | Disposition: A | Discharge status: Home / Self Care | Payer: Medicare Other | Attending: Emergency Medicine | Admitting: Emergency Medicine

## 2019-12-20 DIAGNOSIS — F1320 Sedative, hypnotic or anxiolytic dependence, uncomplicated: Secondary | ICD-10-CM | POA: Insufficient documentation

## 2019-12-20 DIAGNOSIS — R58 Hemorrhage, not elsewhere classified: Secondary | ICD-10-CM | POA: Diagnosis not present

## 2019-12-20 DIAGNOSIS — J45909 Unspecified asthma, uncomplicated: Secondary | ICD-10-CM | POA: Diagnosis not present

## 2019-12-20 DIAGNOSIS — Z79899 Other long term (current) drug therapy: Secondary | ICD-10-CM | POA: Diagnosis not present

## 2019-12-20 DIAGNOSIS — I110 Hypertensive heart disease with heart failure: Secondary | ICD-10-CM | POA: Insufficient documentation

## 2019-12-20 DIAGNOSIS — F13930 Sedative, hypnotic or anxiolytic use, unspecified with withdrawal, uncomplicated: Secondary | ICD-10-CM

## 2019-12-20 DIAGNOSIS — K5730 Diverticulosis of large intestine without perforation or abscess without bleeding: Secondary | ICD-10-CM | POA: Diagnosis not present

## 2019-12-20 DIAGNOSIS — I509 Heart failure, unspecified: Secondary | ICD-10-CM | POA: Insufficient documentation

## 2019-12-20 DIAGNOSIS — I700 Atherosclerosis of aorta: Secondary | ICD-10-CM | POA: Diagnosis not present

## 2019-12-20 DIAGNOSIS — Z87891 Personal history of nicotine dependence: Secondary | ICD-10-CM | POA: Diagnosis not present

## 2019-12-20 DIAGNOSIS — R0902 Hypoxemia: Secondary | ICD-10-CM | POA: Diagnosis not present

## 2019-12-20 DIAGNOSIS — I2510 Atherosclerotic heart disease of native coronary artery without angina pectoris: Secondary | ICD-10-CM | POA: Insufficient documentation

## 2019-12-20 DIAGNOSIS — R112 Nausea with vomiting, unspecified: Secondary | ICD-10-CM | POA: Diagnosis not present

## 2019-12-20 DIAGNOSIS — K429 Umbilical hernia without obstruction or gangrene: Secondary | ICD-10-CM | POA: Diagnosis not present

## 2019-12-20 DIAGNOSIS — E039 Hypothyroidism, unspecified: Secondary | ICD-10-CM | POA: Insufficient documentation

## 2019-12-20 DIAGNOSIS — J449 Chronic obstructive pulmonary disease, unspecified: Secondary | ICD-10-CM | POA: Insufficient documentation

## 2019-12-20 DIAGNOSIS — K2900 Acute gastritis without bleeding: Secondary | ICD-10-CM

## 2019-12-20 DIAGNOSIS — Z9861 Coronary angioplasty status: Secondary | ICD-10-CM | POA: Diagnosis not present

## 2019-12-20 DIAGNOSIS — R110 Nausea: Secondary | ICD-10-CM | POA: Diagnosis not present

## 2019-12-20 DIAGNOSIS — I10 Essential (primary) hypertension: Secondary | ICD-10-CM | POA: Diagnosis not present

## 2019-12-20 DIAGNOSIS — K7689 Other specified diseases of liver: Secondary | ICD-10-CM | POA: Diagnosis not present

## 2019-12-20 LAB — CBC WITH DIFFERENTIAL/PLATELET
Abs Immature Granulocytes: 0.01 10*3/uL (ref 0.00–0.07)
Basophils Absolute: 0.1 10*3/uL (ref 0.0–0.1)
Basophils Relative: 1 %
Eosinophils Absolute: 0 10*3/uL (ref 0.0–0.5)
Eosinophils Relative: 0 %
HCT: 42.7 % (ref 36.0–46.0)
Hemoglobin: 13.8 g/dL (ref 12.0–15.0)
Immature Granulocytes: 0 %
Lymphocytes Relative: 19 %
Lymphs Abs: 1.1 10*3/uL (ref 0.7–4.0)
MCH: 28.5 pg (ref 26.0–34.0)
MCHC: 32.3 g/dL (ref 30.0–36.0)
MCV: 88.2 fL (ref 80.0–100.0)
Monocytes Absolute: 0.4 10*3/uL (ref 0.1–1.0)
Monocytes Relative: 7 %
Neutro Abs: 4.3 10*3/uL (ref 1.7–7.7)
Neutrophils Relative %: 73 %
Platelets: 182 10*3/uL (ref 150–400)
RBC: 4.84 MIL/uL (ref 3.87–5.11)
RDW: 16.4 % — ABNORMAL HIGH (ref 11.5–15.5)
WBC: 5.9 10*3/uL (ref 4.0–10.5)
nRBC: 0 % (ref 0.0–0.2)

## 2019-12-20 LAB — COMPREHENSIVE METABOLIC PANEL
ALT: 38 U/L (ref 0–44)
AST: 40 U/L (ref 15–41)
Albumin: 3.7 g/dL (ref 3.5–5.0)
Alkaline Phosphatase: 76 U/L (ref 38–126)
Anion gap: 14 (ref 5–15)
BUN: 16 mg/dL (ref 8–23)
CO2: 28 mmol/L (ref 22–32)
Calcium: 8.6 mg/dL — ABNORMAL LOW (ref 8.9–10.3)
Chloride: 96 mmol/L — ABNORMAL LOW (ref 98–111)
Creatinine, Ser: 0.88 mg/dL (ref 0.44–1.00)
GFR calc Af Amer: >60 mL/min (ref >60)
GFR calc non Af Amer: >60 mL/min (ref >60)
Glucose, Bld: 134 mg/dL — ABNORMAL HIGH (ref 70–99)
Potassium: 3.1 mmol/L — ABNORMAL LOW (ref 3.5–5.1)
Sodium: 138 mmol/L (ref 135–145)
Total Bilirubin: 0.6 mg/dL (ref 0.3–1.2)
Total Protein: 6.8 g/dL (ref 6.5–8.1)

## 2019-12-20 LAB — LIPASE, BLOOD: Lipase: 37 U/L (ref 11–51)

## 2019-12-20 MED ORDER — ALPRAZOLAM 0.5 MG PO TABS
0.5000 mg | ORAL_TABLET | Freq: Once | ORAL | Status: AC
  Administered 2019-12-20: 0.5 mg via ORAL
  Filled 2019-12-20: qty 1

## 2019-12-20 MED ORDER — HYDROMORPHONE HCL 1 MG/ML IJ SOLN
0.5000 mg | Freq: Once | INTRAMUSCULAR | Status: AC
  Administered 2019-12-20: 0.5 mg via INTRAVENOUS
  Filled 2019-12-20: qty 1

## 2019-12-20 MED ORDER — ONDANSETRON HCL 4 MG/2ML IJ SOLN
4.0000 mg | Freq: Once | INTRAMUSCULAR | Status: AC
  Administered 2019-12-20: 4 mg via INTRAVENOUS
  Filled 2019-12-20: qty 2

## 2019-12-20 MED ORDER — ONDANSETRON 4 MG PO TBDP: 4.0000 mg | ORAL_TABLET | Freq: Three times a day (TID) | ORAL | 1 refills | Status: AC | PRN

## 2019-12-20 MED ORDER — BARIUM SULFATE 2.1 % PO SUSP
ORAL | Status: AC
  Filled 2019-12-20: qty 2

## 2019-12-20 MED ORDER — SODIUM CHLORIDE 0.9 % IV SOLN: INTRAVENOUS | Status: DC

## 2019-12-20 MED ORDER — ALPRAZOLAM 0.5 MG PO TABS
0.5000 mg | ORAL_TABLET | Freq: Three times a day (TID) | ORAL | 0 refills | Status: DC | PRN
Start: 2019-12-20 — End: 2020-01-07

## 2019-12-20 NOTE — ED Triage Notes (Signed)
Patient presents to the ED for fatigue, nausea, vomiting for one week.  Patient reports diarrhea 7 times today.  Patient reports short of breath as well beginning today.

## 2019-12-20 NOTE — Discharge Instructions (Signed)
Some of your symptoms may be due to withdrawal from long-term use of Xanax.  Take Xanax as directed.  Also take the Zofran as needed for any persistent vomiting.  Today's work-up without any acute abdominal findings.  Follow-up with your doctors.

## 2019-12-20 NOTE — ED Provider Notes (Signed)
[facility] EMERGENCY DEPARTMENT Provider Note   CSN: [id number] Arrival date & time: 12/20/19  1335     History Chief Complaint  Patient presents with  . Fatigue    Sharon Compton is a 79 y.o. female.  Patient presenting to the emergency department with complaint of fatigue and nausea and vomiting for a week.  Also mentions some diarrhea just today.  Patient appears very anxious.  Patient denies any blood in the bowel movements.  States there is some generalized abdominal pain.  Patient did ask for sleeping pills at home.  Chart review shows that patient has been on Xanax most of this year.  Would have received a 30-day supply that would have run out on June 21.  Some of the symptoms could be benzo diazepam withdrawal.        Past Medical History:  Diagnosis Date  . A-fib [facility])    Early 2013, had TEE/DCCV at [facility]; pt reports DCCV 2014 at [facility] as well.  . Anxiety and depression   . Asthma   . 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 CAD (coronary artery disease) 02/2013   2.75 x 32 mm Rebel bare metal stent in the distal RCA.   [name] COPD (chronic obstructive pulmonary disease) ([place])   . Diverticular disease 10/01/2011  . Fibromyalgia   . GERD (gastroesophageal reflux disease)   . GI bleed    Recurrent, hx cecal AVMs, ablated 07/2011; hx PUD also  . Hiatal hernia   . History of pneumonia   . Hypertension   . Lymphedema   . MI, acute, non ST segment elevation ([place]) 10/02/2011  . Peptic ulcer disease   . Peripheral edema     Patient Active Problem List   Diagnosis Date Noted  . Noncompliance 04/09/2019  . Fracture, [place], left, closed 10/18/18 12/01/2018  . Migraine 11/05/2018  . Bilateral hearing loss 05/27/2018  . Controlled substance agreement signed 03/19/2018  . Edema of extremities 01/28/2017  . CHF (congestive heart failure) ([place]) 01/28/2017  . Chronic atrial fibrillation ([place])   . Hypothyroidism   . CAD (coronary artery disease) of artery bypass  graft 01/21/2015  . Esophageal reflux   . Essential hypertension   . Iron deficiency anemia 03/19/2014  . Insomnia 07/16/2013  . Major depressive disorder, recurrent episode, severe ([place]) 06/14/2013  . Posttraumatic stress disorder 06/14/2013  . CAD (coronary artery disease), native coronary artery 03/05/2013  . Angina, class III ([place]) 03/05/2013  . Hypokalemia 03/05/2013  . Hyperglycemia 10/03/2011  . Atrial fibrillation ([place]) 10/01/2011  . 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 Diverticulosis 10/01/2011  . Elevated LFTs 08/22/2011  . Anxiety disorder 08/21/2011  . GI bleed 08/20/2011  . Morbid obesity ([place]) 08/20/2011    Past Surgical History:  Procedure Laterality Date  . abd tumor removed     states was 10 lbs, benign  . ABDOMINAL EXPLORATION SURGERY    . ABDOMINAL HYSTERECTOMY  age 16   nonmalignant reason  . bladder stent    . CARDIAC CATHETERIZATION N/A 01/24/2015   Procedure: Left Heart Cath and Coronary Angiography;  Surgeon: Troy Sine, MD;  Location: [place] CV LAB;  Service: Cardiovascular;  Laterality: N/A;  . CARDIOVASCULAR STRESS TEST  09/2011   equivocal result, most likely low risk; pt refused the recommended cardiac cath to follow this up.  . CHOLECYSTECTOMY    . COLONOSCOPY  08/25/2011   Procedure: COLONOSCOPY;  Surgeon: Daneil Dolin, MD;  Location[facility];  Service: Endoscopy;  Laterality: N/A;  . COLONOSCOPY  10/03/2011   Procedure: COLONOSCOPY;  Surgeon: Daneil Dolin, MD;  Location: [facility];  Service: Endoscopy;  Laterality: N/A;  NEEDS PHENERGAN 25 MG IV ON CALL  . COLONOSCOPY  10/04/2011   Procedure: COLONOSCOPY;  Surgeon: Daneil Dolin, MD;  Location: [facility];  Service: Endoscopy;  Laterality: N/A;  Phenergan 12.5 mg ON CALL  . CORONARY ANGIOPLASTY  02/2013  . ESOPHAGOGASTRODUODENOSCOPY  08/24/2011   Procedure: ESOPHAGOGASTRODUODENOSCOPY (EGD);  Surgeon: Daneil Dolin, MD;  Location[facility];  Service:  Endoscopy;  Laterality: N/A;  give phenergan 12.5mg  iv 30 mins prior to procedure  . KNEE SURGERY    . LEFT AND RIGHT HEART CATHETERIZATION WITH CORONARY ANGIOGRAM N/A 03/03/2013   Procedure: LEFT AND RIGHT HEART CATHETERIZATION WITH CORONARY ANGIOGRAM;  Surgeon: Burnell Blanks, MD;  Location: [facility] CATH LAB;  Service: Cardiovascular;  Laterality: N/A;  . PERCUTANEOUS CORONARY STENT INTERVENTION (PCI-S)  02/2013   2.75 x 32 mm Rebel bare metal stent in the distal RCA.   [name] TRANSTHORACIC ECHOCARDIOGRAM  09/2011   EF 60-65%, septal hypokinesia     OB History    Gravida  3   Para  3   Term  3   Preterm      AB      Living  3     SAB      TAB      Ectopic      Multiple      Live Births              Family History  Problem Relation Age of Onset  . Diabetes Mother        Deceased  . Hypertension Mother   . Coronary artery disease Mother   . Heart failure Mother   . Cancer Father        Deceased  . Colon cancer Neg Hx     Social History   Tobacco Use  . Smoking status: Former Smoker    Packs/day: 1.00    Years: 15.00    Pack years: 15.00    Types: Cigarettes    Quit date: 06/26/1995    Years since quitting: 24.5  . Smokeless tobacco: Never Used  Vaping Use  . Vaping Use: Never used  Substance Use Topics  . Alcohol use: No    Alcohol/week: 0.0 standard drinks  . Drug use: No    Home Medications Prior to Admission medications   Medication Sig Start Date End Date Taking? Authorizing Provider  ALPRAZolam Duanne Moron) 0.5 MG tablet Take 1 tablet (0.5 mg total) by mouth 3 (three) times daily as needed for anxiety. 11/13/19   Sharion Balloon, FNP  ALPRAZolam Duanne Moron) 0.5 MG tablet Take 1 tablet (0.5 mg total) by mouth 3 (three) times daily as needed for anxiety or sleep. 12/20/19   Fredia Sorrow, MD  amiodarone (PACERONE) 200 MG tablet Take 1 tablet (200 mg total) by mouth daily. 07/08/19   Herminio Commons, MD  aspirin 81 MG tablet Take 81 mg by mouth  daily.    [provider]  diltiazem (CARDIZEM CD) 240 MG 24 hr capsule TAKE 1 CAPSULE BY MOUTH EVERY DAY 10/22/19   Herminio Commons, MD  liothyronine (CYTOMEL) 50 MCG tablet Take 1 tablet (50 mcg total) by mouth daily. 07/21/19   Sharion Balloon, FNP  losartan (COZAAR) 50 MG tablet Take 1 tablet by mouth once daily  08/25/19   Sharion Balloon, FNP  metoprolol tartrate (LOPRESSOR) 25 MG tablet Take 1/2 (one-half) tablet by mouth twice daily 11/20/19   Evelina Dun A, FNP  ondansetron (ZOFRAN ODT) 4 MG disintegrating tablet Take 1 tablet (4 mg total) by mouth every 8 (eight) hours as needed. 12/20/19   Fredia Sorrow, MD  potassium chloride SA (K-DUR,KLOR-CON) 20 MEQ tablet Take 2 tablets (40 mEq total) by mouth 2 (two) times daily for 15 days. 01/29/18 10/03/19  Mesner, Corene Cornea, MD  rosuvastatin (CRESTOR) 40 MG tablet TAKE 1 TABLET BY MOUTH AT BEDTIME (DOSAGE  INCREASE) 05/28/19   Herminio Commons, MD  torsemide [facility]) 20 MG tablet Take 20mg  by mouth every morning 06/30/18   Eustaquio Maize, MD    Allergies    Iohexol, Dye fdc red [red dye], Prednisone, Sulfa antibiotics, and Codeine  Review of Systems   Review of Systems  Constitutional: Negative for chills and fever.  HENT: Negative for congestion, rhinorrhea and sore throat.   Eyes: Negative for visual disturbance.  Respiratory: Negative for cough and shortness of breath.   Cardiovascular: Negative for chest pain and leg swelling.  Gastrointestinal: Positive for abdominal pain, diarrhea, nausea and vomiting.  Genitourinary: Negative for dysuria.  Musculoskeletal: Negative for back pain and neck pain.  Skin: Negative for rash.  Neurological: Negative for dizziness, light-headedness and headaches.  Hematological: Does not bruise/bleed easily.  Psychiatric/Behavioral: Negative for confusion. The patient is nervous/anxious.     Physical Exam Updated Vital Signs BP (!) 221/68 (BP Location: Right Arm)   Temp 98.8 F (37.1  C) (Oral)   Resp (!) 21   Ht 1.524 m (5')   Wt 79.4 kg   SpO2 98%   BMI 34.18 kg/m   Physical Exam Vitals and nursing note reviewed.  Constitutional:      General: She is not in acute distress.    Appearance: Normal appearance. She is well-developed.  HENT:     Head: Normocephalic and atraumatic.     Mouth/Throat:     Mouth: Mucous membranes are moist.  Eyes:     Extraocular Movements: Extraocular movements intact.     Conjunctiva/sclera: Conjunctivae normal.     Pupils: Pupils are equal, round, and reactive to light.  Cardiovascular:     Rate and Rhythm: Normal rate and regular rhythm.     Heart sounds: No murmur heard.   Pulmonary:     Effort: Pulmonary effort is normal. No respiratory distress.     Breath sounds: Normal breath sounds.  Abdominal:     General: There is distension.     Palpations: Abdomen is soft.     Tenderness: There is no abdominal tenderness.  Musculoskeletal:        General: Normal range of motion.     Cervical back: Normal range of motion and neck supple.  Skin:    General: Skin is warm and dry.     Capillary Refill: Capillary refill takes less than 2 seconds.  Neurological:     General: No focal deficit present.     Mental Status: She is alert and oriented to person, place, and time.     Cranial Nerves: No cranial nerve deficit.     Sensory: No sensory deficit.     Motor: No weakness.     ED Results / Procedures / Treatments   Labs (all labs ordered are listed, but only abnormal results are displayed) Labs Reviewed  CBC WITH DIFFERENTIAL/PLATELET - Abnormal; Notable for the following  components:      Result Value   RDW 16.4 (*)    All other components within normal limits  COMPREHENSIVE METABOLIC PANEL - Abnormal; Notable for the following components:   Potassium 3.1 (*)    Chloride 96 (*)    Glucose, Bld 134 (*)    Calcium 8.6 (*)    All other components within normal limits  LIPASE, BLOOD    EKG EKG  Interpretation  Date/Time:  Sunday December 20 2019 13:46:52 EDT Ventricular Rate:  61 PR Interval:    QRS Duration: 128 QT Interval:  554 QTC Calculation: 559 R Axis:   80 Text Interpretation: Sinus rhythm Consider right atrial enlargement IVCD, consider atypical RBBB Probable LVH with secondary repol abnrm Prolonged QT interval Interpretation limited secondary to artifact Confirmed by Fredia Sorrow [phone]) on 12/20/2019 3:32:15 PM   Radiology CT Abdomen Pelvis Wo Contrast  Result Date: 12/20/2019 CLINICAL DATA:  Abdominal distension. EXAM: CT ABDOMEN AND PELVIS WITHOUT CONTRAST TECHNIQUE: Multidetector CT imaging of the abdomen and pelvis was performed following the standard protocol without IV contrast. COMPARISON:  CT dated 09/27/2014 FINDINGS: Lower chest: The lung bases are clear. The heart size is normal. Hepatobiliary: The liver parenchyma is hyperdense. Status post cholecystectomy.There is no biliary ductal dilation. Pancreas: Normal contours without ductal dilatation. No peripancreatic fluid collection. Spleen: Unremarkable. Adrenals/Urinary Tract: --Adrenal glands: Unremarkable. --Right kidney/ureter: No hydronephrosis or radiopaque kidney stones. --Left kidney/ureter: No hydronephrosis or radiopaque kidney stones. --Urinary bladder: Unremarkable. Stomach/Bowel: --Stomach/Duodenum: No hiatal hernia or other gastric abnormality. Normal duodenal course and caliber. --Small bowel: Unremarkable. --Colon: Rectosigmoid diverticulosis without acute inflammation. --Appendix: Normal. Vascular/Lymphatic: Atherosclerotic calcification is present within the non-aneurysmal abdominal aorta, without hemodynamically significant stenosis. --No retroperitoneal lymphadenopathy. --No mesenteric lymphadenopathy. --No pelvic or inguinal lymphadenopathy. Reproductive: Status post hysterectomy. No adnexal mass. Other: No ascites or free air. There is a fat containing umbilical hernia. Musculoskeletal. No acute  displaced fractures. IMPRESSION: 1. No acute abdominopelvic abnormality. 2. Rectosigmoid diverticulosis without acute inflammation. 3. Fat containing umbilical hernia. Aortic Atherosclerosis (ICD10-I70.0). Electronically Signed   By: Constance Holster M.D.   On: 12/20/2019 18:41    Procedures Procedures (including critical care time)  Medications Ordered in ED Medications  0.9 %  sodium chloride infusion ( Intravenous New Bag/Given 12/20/19 1543)  Barium Sulfate 2.1 % SUSP (has no administration in time range)  ALPRAZolam (XANAX) tablet 0.5 mg (has no administration in time range)  ondansetron (ZOFRAN) injection 4 mg (4 mg Intravenous Given 12/20/19 1633)  HYDROmorphone (DILAUDID) injection 0.5 mg (0.5 mg Intravenous Given 12/20/19 1633)  ondansetron (ZOFRAN) injection 4 mg (4 mg Intravenous Given 12/20/19 1929)    ED Course  I have reviewed the triage vital signs and the nursing notes.  Pertinent labs & imaging results that were available during my care of the patient were reviewed by me and considered in my medical decision making (see chart for details).    MDM Rules/Calculators/A&P                          CT scan work-up without any acute findings nothing to suggest a significant gastroenteritis.  Patient still with some dry heaves vomiting here despite Zofran x2.  Symptoms may very well be benzo diazepam withdrawal.  Patient seems very anxious has got a little bit of a tremor.  But mentating okay.  Will give patient a dose of Xanax here and a small prescription of Xanax.  Patient followed [facility].  She will need to go see them for additional doses of the Xanax.  No acute abdominal process.  Labs without any significant abnormalities.  Other than mild hypokalemia. Final Clinical Impression(s) / ED Diagnoses Final diagnoses:  Acute gastritis without hemorrhage, unspecified gastritis type  Benzodiazepine withdrawal without complication ([place])    Rx / DC  Orders ED Discharge Orders         Ordered    ALPRAZolam (XANAX) 0.5 MG tablet  3 times daily PRN     Discontinue  Reprint     12/20/19 2003    ondansetron (ZOFRAN ODT) 4 MG disintegrating tablet  Every 8 hours PRN     Discontinue  Reprint     12/20/19 Rondel Baton, MD 12/20/19 2007

## 2019-12-21 DIAGNOSIS — R110 Nausea: Secondary | ICD-10-CM | POA: Diagnosis not present

## 2019-12-21 DIAGNOSIS — R0902 Hypoxemia: Secondary | ICD-10-CM | POA: Diagnosis not present

## 2019-12-21 DIAGNOSIS — Z743 Need for continuous supervision: Secondary | ICD-10-CM | POA: Diagnosis not present

## 2019-12-21 DIAGNOSIS — I10 Essential (primary) hypertension: Secondary | ICD-10-CM | POA: Diagnosis not present

## 2019-12-21 NOTE — ED Notes (Signed)
Pt updated and informed we are still waiting on EMS to take her home.

## 2019-12-24 ENCOUNTER — Telehealth: Payer: Self-pay | Admitting: Family

## 2019-12-24 NOTE — Telephone Encounter (Signed)
No I will not accept her

## 2019-12-24 NOTE — Telephone Encounter (Signed)
Patient is requesting a PCP change from [place] to [facility]. Patient needs a ER follow up for elevated BP. Patient states she is upset with her care and would like to transfer back to Ronnald Collum, FNP. I advised patient we would ask the providers and let her know.

## 2019-12-24 NOTE — Telephone Encounter (Signed)
This is fine me, but MMM will have to agree as she is on xanax.

## 2019-12-24 NOTE — Telephone Encounter (Signed)
Waiting on MMM approval

## 2019-12-24 NOTE — Telephone Encounter (Signed)
Patient made aware.  Patient states that she was currently seeing black spots and is worried  about having a stroke.  Advised patient she should call EMS to be transported to the ER to be evaluated.  No available appts at this office.  Patient states ok and hangs up the phone.

## 2019-12-29 ENCOUNTER — Telehealth: Payer: Self-pay | Admitting: Family

## 2019-12-29 NOTE — Telephone Encounter (Signed)
Patient called stating she needed a ER follow up.  Seen in the ED on 6/27 with Vomiting, diarrhea, fever and Benzo withdrawals.  Patient also states that she was passing blood with BMs.  Advised patient that we could not see her in office with these symptoms at this time.  Offered MyChart Video visit and patient declined.

## 2020-01-07 ENCOUNTER — Encounter: Payer: Self-pay | Admitting: Family

## 2020-01-07 ENCOUNTER — Ambulatory Visit (INDEPENDENT_AMBULATORY_CARE_PROVIDER_SITE_OTHER): Payer: Medicare Other | Admitting: Family

## 2020-01-07 ENCOUNTER — Other Ambulatory Visit: Payer: Self-pay | Admitting: Family Medicine

## 2020-01-07 DIAGNOSIS — K219 Gastro-esophageal reflux disease without esophagitis: Secondary | ICD-10-CM

## 2020-01-07 DIAGNOSIS — I10 Essential (primary) hypertension: Secondary | ICD-10-CM | POA: Diagnosis not present

## 2020-01-07 DIAGNOSIS — E785 Hyperlipidemia, unspecified: Secondary | ICD-10-CM

## 2020-01-07 DIAGNOSIS — Z91199 Patient's noncompliance with other medical treatment and regimen due to unspecified reason: Secondary | ICD-10-CM

## 2020-01-07 DIAGNOSIS — H65193 Other acute nonsuppurative otitis media, bilateral: Secondary | ICD-10-CM

## 2020-01-07 DIAGNOSIS — I25700 Atherosclerosis of coronary artery bypass graft(s), unspecified, with unstable angina pectoris: Secondary | ICD-10-CM | POA: Diagnosis not present

## 2020-01-07 DIAGNOSIS — F411 Generalized anxiety disorder: Secondary | ICD-10-CM

## 2020-01-07 DIAGNOSIS — E039 Hypothyroidism, unspecified: Secondary | ICD-10-CM

## 2020-01-07 DIAGNOSIS — F332 Major depressive disorder, recurrent severe without psychotic features: Secondary | ICD-10-CM

## 2020-01-07 DIAGNOSIS — I509 Heart failure, unspecified: Secondary | ICD-10-CM

## 2020-01-07 DIAGNOSIS — D508 Other iron deficiency anemias: Secondary | ICD-10-CM

## 2020-01-07 DIAGNOSIS — Z9119 Patient's noncompliance with other medical treatment and regimen: Secondary | ICD-10-CM

## 2020-01-07 DIAGNOSIS — F4310 Post-traumatic stress disorder, unspecified: Secondary | ICD-10-CM

## 2020-01-07 DIAGNOSIS — Z79899 Other long term (current) drug therapy: Secondary | ICD-10-CM

## 2020-01-07 MED ORDER — ALPRAZOLAM 0.5 MG PO TABS: 0.5000 mg | ORAL_TABLET | Freq: Three times a day (TID) | ORAL | 0 refills | Status: AC | PRN

## 2020-01-07 MED ORDER — POTASSIUM CHLORIDE CRYS ER 20 MEQ PO TBCR: 40.0000 meq | EXTENDED_RELEASE_TABLET | Freq: Every day | ORAL | 1 refills | Status: AC

## 2020-01-07 MED ORDER — AMOXICILLIN 875 MG PO TABS: 875.0000 mg | ORAL_TABLET | Freq: Two times a day (BID) | ORAL | 0 refills | Status: DC

## 2020-01-07 NOTE — Progress Notes (Signed)
Virtual Visit via telephone Note Due to COVID-19 pandemic this visit was conducted virtually. This visit type was conducted due to national recommendations for restrictions regarding the COVID-19 Pandemic (e.g. social distancing, sheltering in place) in an effort to limit this patient's exposure and mitigate transmission in our community. All issues noted in this document were discussed and addressed.  A physical exam was not performed with this format.  I connected with Sharon Compton on 01/07/20 at 4:20 pm  by telephone and verified that I am speaking with the correct person using two identifiers. Sharon Compton is currently located at home and no one is currently with her during visit. The provider, Evelina Dun, FNP is located in their office at time of visit.  I discussed the limitations, risks, security and privacy concerns of performing an evaluation and management service by telephone and the availability of in person appointments. I also discussed with the patient that there may be a patient responsible charge related to this service. The patient expressed understanding and agreed to proceed.   History and Present Illness:  Pt call the  office today for chronic follow up.  She is followed by Cardiologists every 6 months for CAD, A Fib,and CHF.   She states she can not take her levothyroxine because it makes it sick.She refused her Endocrinologists.She has refuses behavorial health appt, because they "called the law" on her.  PT was seen in the ED on 12/20/19 with gastritis and benzodiazepine withdrawal. Her BP was very elevated.   Her husband died unexpectedly about 4 months ago. She is having a hard time dealing with that.  Hypertension This is a chronic problem. The current episode started more than 1 year ago. The problem has been waxing and waning since onset. The problem is uncontrolled. Associated symptoms include anxiety, malaise/fatigue and shortness of breath.  Pertinent negatives include no peripheral edema. Hypertensive end-organ damage includes CAD/MI and heart failure. Identifiable causes of hypertension include a thyroid problem.  Anxiety Presents for follow-up visit. Symptoms include decreased concentration, depressed mood, excessive worry, irritability, nervous/anxious behavior, restlessness and shortness of breath. The severity of symptoms is moderate.    Thyroid Problem Presents for follow-up visit. Symptoms include anxiety, depressed mood and fatigue. The symptoms have been stable. Her past medical history is significant for heart failure and hyperlipidemia.  Hyperlipidemia This is a chronic problem. The current episode started more than 1 year ago. The problem is controlled. Recent lipid tests were reviewed and are normal. Exacerbating diseases include hypothyroidism and obesity. Associated symptoms include shortness of breath. Current antihyperlipidemic treatment includes statins. The current treatment provides moderate improvement of lipids. Risk factors for coronary artery disease include diabetes mellitus, dyslipidemia, hypertension, a sedentary lifestyle and post-menopausal.  Ear Fullness  There is pain in both ears. This is a new problem. The current episode started 1 to 4 weeks ago. The problem occurs constantly. The problem has been unchanged. There has been no fever. The pain is moderate. Associated symptoms include ear discharge and hearing loss. The treatment provided mild relief.      Review of Systems  Constitutional: Positive for fatigue, irritability and malaise/fatigue.  HENT: Positive for ear discharge and hearing loss.   Respiratory: Positive for shortness of breath.   Psychiatric/Behavioral: Positive for decreased concentration. The patient is nervous/anxious.   All other systems reviewed and are negative.    Observations/Objective: No SOB or distress noted, pt very angry and agitated today about her xanax.  Assessment and Plan: 1. Coronary artery disease involving coronary bypass graft of native heart with unstable angina pectoris [facility]) - Ambulatory referral to Chronic Care Management Services  2. Congestive heart failure, unspecified HF chronicity, unspecified heart failure type [facility]) - Ambulatory referral to Chronic Care Management Services  3. Essential hypertension - Ambulatory referral to Chronic Care Management Services  4. Gastroesophageal reflux disease without esophagitis - Ambulatory referral to Chronic Care Management Services  5. Hypothyroidism, unspecified type - Ambulatory referral to Chronic Care Management Services  6. Generalized anxiety disorder - Ambulatory referral to Chronic Care Management Services - ALPRAZolam (XANAX) 0.5 MG tablet; Take 1 tablet (0.5 mg total) by mouth 3 (three) times daily as needed for anxiety or sleep.  Dispense: 90 tablet; Refill: 0  7. Controlled substance agreement signed  - Ambulatory referral to Chronic Care Management Services - ALPRAZolam (XANAX) 0.5 MG tablet; Take 1 tablet (0.5 mg total) by mouth 3 (three) times daily as needed for anxiety or sleep.  Dispense: 90 tablet; Refill: 0  8. Other iron deficiency anemia - Ambulatory referral to Chronic Care Management Services  9. Severe episode of recurrent major depressive disorder, without psychotic features ([place]) - Ambulatory referral to Chronic Care Management Services  10. Morbid obesity ([place])  - Ambulatory referral to Chronic Care Management Services  11. Noncompliance - Ambulatory referral to Chronic Care Management Services  12. Posttraumatic stress disorder - Ambulatory referral to Chronic Care Management Services  13. Hyperlipidemia, unspecified hyperlipidemia type - Ambulatory referral to Chronic Care Management Services  14. Other non-recurrent acute nonsuppurative otitis media of both ears - Ambulatory referral to Chronic Care Management Services - amoxicillin  (AMOXIL) 875 MG tablet; Take 1 tablet (875 mg total) by mouth 2 (two) times daily.  Dispense: 14 tablet; Refill: 0  I will give a one month refill of xanax 0.5 mg TID. I discussed I can only do one month and she will need to be seen face to face for lab work and to up date contract.  Pt reviewed in [place] controlled.  She does not check her BP at home and states she can not come in because she is so "weak and can't walk". She does not want to go to ED.    I discussed the assessment and treatment plan with the patient. The patient was provided an opportunity to ask questions and all were answered. The patient agreed with the plan and demonstrated an understanding of the instructions.   The patient was advised to call back or seek an in-person evaluation if the symptoms worsen or if the condition fails to improve as anticipated.  The above assessment and management plan was discussed with the patient. The patient verbalized understanding of and has agreed to the management plan. Patient is aware to call the clinic if symptoms persist or worsen. Patient is aware when to return to the clinic for a follow-up visit. Patient educated on when it is appropriate to go to the emergency department.   Time call ended: 4:49 pm    I provided 29 minutes of non-face-to-face time during this encounter.    Evelina Dun, FNP

## 2020-01-08 ENCOUNTER — Telehealth: Payer: Self-pay | Admitting: Family

## 2020-01-08 NOTE — Chronic Care Management (AMB) (Signed)
  Chronic Care Management   Outreach Note  01/08/2020 Name: Sharon Compton MRN: [id number] DOB: 07-Jul-1940  Sharon Compton is a 79 y.o. year old female who is a primary care patient of Sharion Balloon, FNP. I reached out to [name] by phone today in response to a referral sent by [name]'s PCP, Sharion Balloon, FNP.     An unsuccessful telephone outreach was attempted today. The patient was referred to the case management team for assistance with care management and care coordination.   Follow Up Plan: A HIPPA compliant phone message was left for the patient providing contact information and requesting a return call. The care management team will reach out to the patient again over the next 7 days. If patient returns call to provider office, please advise to call [place] at [phone].  [place],  [id number] Direct Dial: [phone] [email or web address]: [email or web address]

## 2020-01-18 NOTE — Chronic Care Management (AMB) (Signed)
  Chronic Care Management   Outreach Note  01/18/2020 Name: Sharon Compton MRN: [id number] DOB: November 27, 1940  Sharon Compton is a 79 y.o. year old female who is a primary care patient of Sharion Balloon, FNP. I reached out to [name] by phone today in response to a referral sent by [name]'s PCP, Sharion Balloon, FNP.     An unsuccessful telephone outreach was attempted today. The patient was referred to the case management team for assistance with care management and care coordination.   Follow Up Plan: The care management team will reach out to the patient again over the next 7 days.  If patient returns call to provider office, please advise to call [name] at [phone].  [place], [place] [address] Direct Dial: [phone] [email or web address] Website: [email or web address]

## 2020-01-25 NOTE — Chronic Care Management (AMB) (Signed)
  Chronic Care Management   Note  01/12/2020 Name: Sharon Compton MRN: [id number] DOB: 1941-04-19  MARGREE GIMBEL is a 79 y.o. year old female who is a primary care patient of Sharion Balloon, FNP. I reached out to [name] by phone today in response to a referral sent by Ms. Karsten Fells Peloso's PCP, Sharion Balloon, FNP.     [name] was given information about Chronic Care Management services today including:  1. CCM service includes personalized support from designated clinical staff supervised by her physician, including individualized plan of care and coordination with other care providers 2. 24/7 contact phone numbers for assistance for urgent and routine care needs. 3. Service will only be billed when office clinical staff spend 20 minutes or more in a month to coordinate care. 4. Only one practitioner may furnish and bill the service in a calendar month. 5. The patient may stop CCM services at any time (effective at the end of the month) by phone call to the office staff. 6. The patient will be responsible for cost sharing (co-pay) of up to 20% of the service fee (after annual deductible is met).  [name] spoke with patient she did not agree to enrollment in care management services and does not wish to consider at this time to speak to LCSW or RN CM.   Follow up plan: Patient declines further follow up and engagement by the care management team. Appropriate care team members and provider have been notified via electronic communication.   [place], [place] [address] Direct Dial: [phone] [email or web address] Website: [email or web address]

## 2020-02-17 ENCOUNTER — Other Ambulatory Visit: Payer: Self-pay | Admitting: Cardiology

## 2020-02-17 MED ORDER — DILTIAZEM HCL ER COATED BEADS 240 MG PO CP24: ORAL_CAPSULE | ORAL | 3 refills | Status: DC

## 2020-02-17 NOTE — Telephone Encounter (Signed)
Refill complete 

## 2020-02-17 NOTE — Telephone Encounter (Signed)
Please refill diltiazem at [facility]    If needed call [phone]   Thanks renee

## 2020-02-19 ENCOUNTER — Other Ambulatory Visit: Payer: Self-pay | Admitting: Family

## 2020-02-19 DIAGNOSIS — I10 Essential (primary) hypertension: Secondary | ICD-10-CM

## 2020-02-23 ENCOUNTER — Telehealth: Payer: Self-pay | Admitting: *Deleted

## 2020-02-23 DIAGNOSIS — I10 Essential (primary) hypertension: Secondary | ICD-10-CM

## 2020-02-23 MED ORDER — METOPROLOL TARTRATE 25 MG PO TABS: ORAL_TABLET | ORAL | 0 refills | Status: DC

## 2020-02-23 NOTE — Telephone Encounter (Signed)
Fax from [facility] Re: Metoprolol Tartrate 25 mg Pt cannot cut manufacturer that was dispensed Can another RF be authorized so a replacement can be dispensed that are easier for patient to cut  Refill sent to pharmacy

## 2020-03-07 ENCOUNTER — Other Ambulatory Visit: Payer: Self-pay | Admitting: Family

## 2020-03-07 ENCOUNTER — Other Ambulatory Visit: Payer: Self-pay | Admitting: *Deleted

## 2020-03-07 MED ORDER — NITROGLYCERIN 0.4 MG SL SUBL: 0.4000 mg | SUBLINGUAL_TABLET | SUBLINGUAL | 1 refills | Status: AC | PRN

## 2020-03-22 ENCOUNTER — Telehealth: Payer: Self-pay | Admitting: Student

## 2020-03-22 NOTE — Telephone Encounter (Signed)
Pt reports that side effects started in July. Pt states the she has several family issues this year and thought symptoms were from that. Pt states that she read the side effects after receiving her last refill. Pt denies trouble breathing and rash. Please advise.

## 2020-03-22 NOTE — Telephone Encounter (Signed)
New message    Pt c/o medication issue:  1. Name of Medication: PACERONE 200 MG tablet  2. How are you currently taking this medication (dosage and times per day)? 1 tablet once a day   3. Are you having a reaction (difficulty breathing--STAT)? yes  4. What is your medication issue? Severe headache , nervousness, cold sweats and chills , body aches and weakness

## 2020-03-24 NOTE — Telephone Encounter (Signed)
Former patient of Dr Raliegh Ip. She has been on amio for 5 years, its not a new medication for her. Should see pcp for her non cardiac symptoms. Needs to see pcp for her non cardiac symptoms, should not be scheduled with [place] for these symptoms until evaluted by pcp  Zandra Abts MD

## 2020-03-25 NOTE — Telephone Encounter (Signed)
Called pt no answer, left msg to call back  

## 2020-03-28 NOTE — Telephone Encounter (Signed)
I spoke with patient.She agrees to see pcp.

## 2020-04-27 NOTE — Progress Notes (Signed)
Virtual Visit via Telephone Note   This visit type was conducted due to national recommendations for restrictions regarding the COVID-19 Pandemic (e.g. social distancing) in an effort to limit this patient's exposure and mitigate transmission in our community.  Due to her co-morbid illnesses, this patient is at least at moderate risk for complications without adequate follow up.  This format is felt to be most appropriate for this patient at this time.  The patient did not have access to video technology/had technical difficulties with video requiring transitioning to audio format only (telephone).  All issues noted in this document were discussed and addressed.  No physical exam could be performed with this format.  Please refer to the patient's chart for her  consent to telehealth for [facility].    Date:  04/28/2020   ID:  Sharon Compton, DOB March 15, 1941, MRN [id number] The patient was identified using 2 identifiers.  Patient Location: Home Provider Location: Office/Clinic  PCP:  Sharion Balloon, FNP  Cardiologist:  Previously followed by Dr. Bronson Ing --> Will need to switch to new MD Electrophysiologist:  None   Evaluation Performed:  Follow-Up Visit  Chief Complaint: Overdue Visit; Recent nausea, vomiting and fever  History of Present Illness:    Sharon Compton is a 79 y.o. female with past medical history of CAD (s/p BMS to distal RCA in 2014, cath in 01/2015 showing patent stent with 70-80% stenosis of small caliber jailed PDA and 50% mid-LAD stenosis with medical management recommended), paroxysmal atrial fibrillation (not on anticoagulation due to prior GIB and AV malformations), chronic diastolic CHF, HTN and HLD who presents for an overdue follow-up telehealth visit.  She most recently had a phone visit with Dr. Bronson Ing in 03/2019 and denied any recent chest pain or dyspnea on exertion at that time. She had recently fractured her left distal radius and ulna following a  mechanical fall and was being followed by Orthopedics. She was not on anticoagulation due to issues with prior GI bleeding and AV malformations. Was continued on Amiodarone 200 mg daily, ASA 81 mg daily, Cardizem CD 240 mg daily, and Lopressor 12.5 mg twice daily.  In talking with the patient today, she reports being acutely ill since last Friday. Says she has run an intermittent fever as high as 101 and has experienced nausea and vomiting along with body aches. She reports being vaccinated against COVID-19 and feels like she may have the flu. She says that her breathing has overall been stable but she is having a mildly productive cough with white mucus. Denies any chest pain or palpitations. A family-friend is bringing her groceries but she reports decreased PO intake due to her N/V.   She does feel like Amiodarone has been causing blurred vision. Has not been evaluated by an Optometrist in years.   Past Medical History:  Diagnosis Date  . A-fib [facility])    Early 2013, had TEE/DCCV at [facility]; pt reports DCCV 2014 at [facility] as well.  . Anxiety and depression   . Asthma   . 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 CAD (coronary artery disease) 02/2013   2.75 x 32 mm Rebel bare metal stent in the distal RCA.   [name] COPD (chronic obstructive pulmonary disease) ([place])   . Diverticular disease 10/01/2011  . Fibromyalgia   . GERD (gastroesophageal reflux disease)   . GI bleed    Recurrent, hx cecal AVMs, ablated 07/2011; hx PUD also  . Hiatal hernia   . History  of pneumonia   . Hypertension   . Lymphedema   . MI, acute, non ST segment elevation ([place]) 10/02/2011  . Peptic ulcer disease   . Peripheral edema    Past Surgical History:  Procedure Laterality Date  . abd tumor removed     states was 10 lbs, benign  . ABDOMINAL EXPLORATION SURGERY    . ABDOMINAL HYSTERECTOMY  age 52   nonmalignant reason  . bladder stent    . CARDIAC CATHETERIZATION N/A 01/24/2015   Procedure: Left Heart Cath  and Coronary Angiography;  Surgeon: Troy Sine, MD;  Location: [facility] CV LAB;  Service: Cardiovascular;  Laterality: N/A;  . CARDIOVASCULAR STRESS TEST  09/2011   equivocal result, most likely low risk; pt refused the recommended cardiac cath to follow this up.  . CHOLECYSTECTOMY    . COLONOSCOPY  08/25/2011   Procedure: COLONOSCOPY;  Surgeon: Daneil Dolin, MD;  [facility];  Service: Endoscopy;  Laterality: N/A;  . COLONOSCOPY  10/03/2011   Procedure: COLONOSCOPY;  Surgeon: Daneil Dolin, MD;  [facility];  Service: Endoscopy;  Laterality: N/A;  NEEDS PHENERGAN 25 MG IV ON CALL  . COLONOSCOPY  10/04/2011   Procedure: COLONOSCOPY;  Surgeon: Daneil Dolin, MD;  [facility];  Service: Endoscopy;  Laterality: N/A;  Phenergan 12.5 mg ON CALL  . CORONARY ANGIOPLASTY  02/2013  . ESOPHAGOGASTRODUODENOSCOPY  08/24/2011   Procedure: ESOPHAGOGASTRODUODENOSCOPY (EGD);  Surgeon: Daneil Dolin, MD;  [facility];  Service: Endoscopy;  Laterality: N/A;  give phenergan 12.5mg  iv 30 mins prior to procedure  . KNEE SURGERY    . LEFT AND RIGHT HEART CATHETERIZATION WITH CORONARY ANGIOGRAM N/A 03/03/2013   Procedure: LEFT AND RIGHT HEART CATHETERIZATION WITH CORONARY ANGIOGRAM;  Surgeon: Burnell Blanks, MD;  Location: [facility] CATH LAB;  Service: Cardiovascular;  Laterality: N/A;  . PERCUTANEOUS CORONARY STENT INTERVENTION (PCI-S)  02/2013   2.75 x 32 mm Rebel bare metal stent in the distal RCA.   [name] TRANSTHORACIC ECHOCARDIOGRAM  09/2011   EF 60-65%, septal hypokinesia     Current Meds  Medication Sig  . ALPRAZolam (XANAX) 0.5 MG tablet Take 1 tablet (0.5 mg total) by mouth 3 (three) times daily as needed for anxiety or sleep.  [name] aspirin 81 MG tablet Take 81 mg by mouth daily.  [name] diltiazem (CARDIZEM CD) 240 MG 24 hr capsule TAKE 1 CAPSULE BY MOUTH EVERY DAY  . liothyronine (CYTOMEL) 50 MCG tablet Take 1 tablet (50 mcg total) by mouth daily.  . metoprolol  tartrate (LOPRESSOR) 25 MG tablet Take 1/2 (one-half) tablet by mouth twice daily  . nitroGLYCERIN (NITROSTAT) 0.4 MG SL tablet Place 1 tablet (0.4 mg total) under the tongue every 5 (five) minutes as needed for chest pain.  [name] ondansetron (ZOFRAN ODT) 4 MG disintegrating tablet Take 1 tablet (4 mg total) by mouth every 8 (eight) hours as needed.  . potassium chloride SA (KLOR-CON) 20 MEQ tablet Take 2 tablets (40 mEq total) by mouth daily.  . rosuvastatin (CRESTOR) 40 MG tablet TAKE 1 TABLET BY MOUTH AT BEDTIME (DOSAGE  INCREASE)  . [DISCONTINUED] PACERONE 200 MG tablet Take 1 tablet by mouth once daily     Allergies:   Iohexol, Dye fdc red [red dye], Prednisone, Sulfa antibiotics, and Codeine   Social History   Tobacco Use  . Smoking status: Former Smoker    Packs/day: 1.00    Years: 15.00    Pack  years: 15.00    Types: Cigarettes    Quit date: 06/26/1995    Years since quitting: 24.8  . Smokeless tobacco: Never Used  Vaping Use  . Vaping Use: Never used  Substance Use Topics  . Alcohol use: No    Alcohol/week: 0.0 standard drinks  . Drug use: No     Family Hx: The patient's family history includes Cancer in her father; Coronary artery disease in her mother; Diabetes in her mother; Heart failure in her mother; Hypertension in her mother. There is no history of Colon cancer.  ROS:   Please see the history of present illness.     All other systems reviewed and are negative.   Prior CV studies:   The following studies were reviewed today:  Cardiac Catheterization: 01/2015  Mid LAD lesion, 50% stenosed.  Dist RCA lesion, 20% stenosed. The lesion was previously treated with a stent (unknown type) .  RPDA lesion, 70% stenosed.  There is hyperdynamic left ventricular systolic function.   Hyperdynamic LV function with an ejection fraction of greater than 70%.  Mild to moderate coronary obstructive disease with smooth 50% tubular stenosis in the LAD after the takeoff of  the proximal second diagonal vessel, which did not significantly improve following IC nitroglycerin administration; normal left circumflex coronary artery; and a large dominant RCA with a patent distal RCA stent in the region of the acute margin with smooth intimal hyperplasia of less than 20% and  70-80% smooth ostial narrowing of a small caliber jailed PDA vessel arising from the distal stented segment.  RECOMMENDATION:  Medical therapy with further titration of beta blocker therapy, addition of nitrates, and optimal blood pressure control.   Echocardiogram: 02/2015 Study Conclusions   - Procedure narrative: Transthoracic echocardiography. Image  quality was poor. The study was technically difficult, as a  result of poor sound wave transmission and body habitus.  - Left ventricle: The cavity size was normal. Wall thickness was  increased in a pattern of moderate LVH. Systolic function was  normal. The estimated ejection fraction was in the range of 55%  to 60%. Images were inadequate for LV wall motion assessment. The  study is not technically sufficient to allow evaluation of LV  diastolic function.  - Mitral valve: Mildly thickened leaflets . There was trivial  regurgitation.  - Left atrium: Mildly dilated at 38 ml/m2.  - Right atrium: The atrium was mildly dilated.  - Tricuspid valve: There was trivial regurgitation.  - Pulmonary arteries: PA peak pressure: 19 mm Hg (S).  - Inferior vena cava: The vessel was normal in size. The  respirophasic diameter changes were in the normal range (>= 50%),  consistent with normal central venous pressure.  - Pericardium, extracardiac: A trivial pericardial effusion was  identified. Features were not consistent with tamponade  physiology. There was a left pleural effusion.   Impressions:   - Technically difficult study. LVEF 55-60%, moderate LVH, mild LAE,  trivial TR, normal RVSP, trivial pericardial effusion  without  tamponade features, left pleural effusion.    Labs/Other Tests and Data Reviewed:    EKG:  No ECG reviewed.  Recent Labs: 07/13/2019: TSH 7.900 12/20/2019: ALT 38; BUN 16; Creatinine, Ser 0.88; Hemoglobin 13.8; Platelets 182; Potassium 3.1; Sodium 138   Recent Lipid Panel Lab Results  Component Value Date/Time   CHOL 157 07/13/2019 11:56 AM   TRIG 85 07/13/2019 11:56 AM   TRIG 159 (H) 07/29/2014 04:29 PM   HDL 50 07/13/2019 11:56 AM  HDL 44 07/29/2014 04:29 PM   CHOLHDL 3.1 07/13/2019 11:56 AM   CHOLHDL 4.7 11/03/2013 06:25 PM   LDLCALC 91 07/13/2019 11:56 AM   LDLCALC 215 (H) 03/19/2014 03:03 PM    Wt Readings from Last 3 Encounters:  12/20/19 175 lb (79.4 kg)  07/13/19 170 lb 3.2 oz (77.2 kg)  06/15/19 173 lb (78.5 kg)     Objective:    Vital Signs:  There were no vitals taken for this visit. --->The patient does not have a BP cuff or pulse oximeter at home.   General: Pleasant female sounding in NAD Psych: Normal affect. Neuro: Alert and oriented X 3.  Lungs:  Resp regular and unlabored while talking on the phone.   ASSESSMENT & PLAN:    1. Nausea/ Vomiting/Fever - She reports being acutely ill for the past 6 days and has experienced nausea and vomiting along with a fever of up to 101. She reports minimal PO intake due to her vomiting and says she is unable to keep down food or liquids except for minimal water with her medications. She says that her breathing has been stable. I encouraged her to seek emergent evaluation if her symptoms do not improve within the next 24 to 48 hours as I am concerned she could be significantly dehydrated given her minimal PO intake. She is nervous about coming to the Emergency Department due to COVID-19 concerns (has been vaccinated against the virus) but says she will consider if symptoms do not improve.   2. CAD - She is s/p BMS to distal RCA un 2014, cath in 01/2015 showing patent stent with 70-80% stenosis of small  caliber jailed PDA and 50% mid-LAD stenosis with medical management recommended. - She denies any recent chest pain or dyspnea on exertion.  - Continue current medication regimen with ASA 81mg  daily, Lopressor 12.5mg  BID and Crestor 40mg  daily.   3. Paroxysmal Atrial Fibrillation - She denies any recent palpitations. Does not have a way of checking her HR/BP at home and I will see if we can obtain a BP cuff from Social Work.  - She is currently on Amiodarone 200mg  daily but feels like this causes blurred vision. I recommended she try reducing this to 100 mg daily and also encouraged her to follow-up with Optometry once she recovers from her acute illness as she should be having annual eye exams while on Amiodarone. She does have hypothyroidism and this is followed by her PCP.  - She has not been on anticoagulation due to prior GIB and AV malformations by review of prior notes.   4. HTN - SBP was elevated to greater than 200 during her Emergency Department visit in 11/2019. She does not have a way of checking this at home and I will see if we can obtain a BP cuff from Social Work.  Continue current medication regimen with Cardizem CD 240 mg daily and Lopressor 12.5 mg twice daily. She is listed as being on Losartan 50 mg daily but reports not currently taking this.   COVID-19 Education: The signs and symptoms of COVID-19 were discussed with the patient and how to seek care for testing (follow up with PCP or arrange E-visit).  The importance of social distancing was discussed today.  Time:   Today, I have spent 17 minutes with the patient with telehealth technology discussing the above problems.     Medication Adjustments/Labs and Tests Ordered: Current medicines are reviewed at length with the patient today.  Concerns regarding  medicines are outlined above.   Tests Ordered: No orders of the defined types were placed in this encounter.   Medication Changes: Meds ordered this encounter    Medications  . amiodarone (PACERONE) 200 MG tablet    Sig: Take 0.5 tablets (100 mg total) by mouth daily.    Dispense:  45 tablet    Refill:  3    Follow Up:  In Person in 6 month(s)  Signed, Erma Heritage, PA-C  04/28/2020 3:36 PM    [facility]

## 2020-04-28 ENCOUNTER — Other Ambulatory Visit: Payer: Self-pay

## 2020-04-28 ENCOUNTER — Encounter: Payer: Self-pay | Admitting: Student

## 2020-04-28 ENCOUNTER — Telehealth (INDEPENDENT_AMBULATORY_CARE_PROVIDER_SITE_OTHER): Payer: Medicare Other | Admitting: Student

## 2020-04-28 DIAGNOSIS — I10 Essential (primary) hypertension: Secondary | ICD-10-CM

## 2020-04-28 DIAGNOSIS — I25700 Atherosclerosis of coronary artery bypass graft(s), unspecified, with unstable angina pectoris: Secondary | ICD-10-CM | POA: Diagnosis not present

## 2020-04-28 DIAGNOSIS — I25118 Atherosclerotic heart disease of native coronary artery with other forms of angina pectoris: Secondary | ICD-10-CM | POA: Diagnosis not present

## 2020-04-28 DIAGNOSIS — R112 Nausea with vomiting, unspecified: Secondary | ICD-10-CM

## 2020-04-28 DIAGNOSIS — I480 Paroxysmal atrial fibrillation: Secondary | ICD-10-CM

## 2020-04-28 DIAGNOSIS — R509 Fever, unspecified: Secondary | ICD-10-CM

## 2020-04-28 MED ORDER — AMIODARONE HCL 200 MG PO TABS
100.0000 mg | ORAL_TABLET | Freq: Every day | ORAL | 3 refills | Status: DC
Start: 2020-04-28 — End: 2021-04-25

## 2020-04-28 NOTE — Patient Instructions (Signed)
Medication Instructions:  Your physician has recommended you make the following change in your medication:  Decrease Amiodarone to 100 mg Daily   *If you need a refill on your cardiac medications before your next appointment, please call your pharmacy*   Lab Work: NONE   If you have labs (blood work) drawn today and your tests are completely normal, you will receive your results only by: [name] MyChart Message (if you have MyChart) OR . A paper copy in the mail If you have any lab test that is abnormal or we need to change your treatment, we will call you to review the results.   Testing/Procedures: NONE    Follow-Up: At [facility], you and your health needs are our priority.  As part of our continuing mission to provide you with exceptional heart care, we have created designated Provider Care Teams.  These Care Teams include your primary Cardiologist (physician) and Advanced Practice Providers (APPs -  Physician Assistants and Nurse Practitioners) who all work together to provide you with the care you need, when you need it.  We recommend signing up for the patient portal called "MyChart".  Sign up information is provided on this After Visit Summary.  MyChart is used to connect with patients for Virtual Visits (Telemedicine).  Patients are able to view lab/test results, encounter notes, upcoming appointments, etc.  Non-urgent messages can be sent to your provider as well.   To learn more about what you can do with MyChart, go to [email or web address].    Your next appointment:   6 month(s)  The format for your next appointment:   In Person  Provider:   Bernerd Pho, PA-C   Other Instructions Thank you for choosing [facility]!

## 2020-06-07 ENCOUNTER — Telehealth: Payer: Self-pay | Admitting: Student

## 2020-06-07 ENCOUNTER — Other Ambulatory Visit: Payer: Self-pay | Admitting: Family

## 2020-06-07 ENCOUNTER — Other Ambulatory Visit: Payer: Self-pay

## 2020-06-07 DIAGNOSIS — I10 Essential (primary) hypertension: Secondary | ICD-10-CM

## 2020-06-07 MED ORDER — DILTIAZEM HCL ER COATED BEADS 240 MG PO CP24: ORAL_CAPSULE | ORAL | 3 refills | Status: DC

## 2020-06-07 NOTE — Telephone Encounter (Signed)
New message      *STAT* If patient is at the pharmacy, call can be transferred to refill team.   1. Which medications need to be refilled? (please list name of each medication and dose if known) diltiazem (CARDIZEM CD) 240 MG 24 hr capsule metoprolol tartrate (LOPRESSOR) 25 MG tablet  2. Which pharmacy/location (including street and city if local pharmacy) is medication to be sent to?  [facility]   3. Do they need a 30 day or 90 day supply? [place]

## 2020-06-16 ENCOUNTER — Ambulatory Visit (INDEPENDENT_AMBULATORY_CARE_PROVIDER_SITE_OTHER): Payer: Medicare Other | Admitting: *Deleted

## 2020-06-16 DIAGNOSIS — Z0000 Encounter for general adult medical examination without abnormal findings: Secondary | ICD-10-CM
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
## 2020-09-23 ENCOUNTER — Telehealth: Payer: Self-pay

## 2020-09-23 ENCOUNTER — Telehealth: Payer: Self-pay | Admitting: Student

## 2020-09-23 DIAGNOSIS — I10 Essential (primary) hypertension: Secondary | ICD-10-CM

## 2020-09-23 MED ORDER — METOPROLOL TARTRATE 25 MG PO TABS: ORAL_TABLET | ORAL | 0 refills | Status: AC

## 2020-09-23 NOTE — Telephone Encounter (Signed)
Patient informed she would need to be seen, she said she will call when she can and hung up

## 2020-09-23 NOTE — Telephone Encounter (Signed)
  Prescription Request  09/23/2020  What is the name of the medication or equipment? metoprolol tartrate (LOPRESSOR) 25 MG tablet  Have you contacted your pharmacy to request a refill? (if applicable) no  Which pharmacy would you like this sent to? [facility]   Patient notified that their request is being sent to the clinical staff for review and that they should receive a response within 2 business days.

## 2020-09-23 NOTE — Telephone Encounter (Signed)
   1. Which medications need to be refilled? (please list name of each medication and dose if known)   METOPROLOL 25 MG   2. Which pharmacy/location (including street and city if local pharmacy) is medication to be sent to?  [facility]   3. Do they need a 30 day or 90 day supply? [place]

## 2020-09-23 NOTE — Telephone Encounter (Signed)
Patient already had called pcp and told she needs apt (see phone note) She also needs f/u apt here. 30 day supply given.

## 2020-10-14 ENCOUNTER — Other Ambulatory Visit: Payer: Self-pay | Admitting: Student

## 2020-10-14 NOTE — Telephone Encounter (Signed)
This is a [place] pt.  °

## 2020-11-10 ENCOUNTER — Other Ambulatory Visit: Payer: Self-pay | Admitting: Student

## 2020-11-10 DIAGNOSIS — I10 Essential (primary) hypertension: Secondary | ICD-10-CM

## 2021-04-09 ENCOUNTER — Other Ambulatory Visit: Payer: Self-pay | Admitting: Student

## 2021-04-24 ENCOUNTER — Other Ambulatory Visit: Payer: Self-pay | Admitting: Student

## 2021-05-03 ENCOUNTER — Other Ambulatory Visit: Payer: Self-pay | Admitting: Student

## 2021-05-03 NOTE — Telephone Encounter (Signed)
This is a [place] pt.  °

## 2021-05-24 ENCOUNTER — Other Ambulatory Visit: Payer: Self-pay | Admitting: Student

## 2021-06-30 DIAGNOSIS — Z23 Encounter for immunization: Secondary | ICD-10-CM | POA: Diagnosis not present

## 2021-07-05 DIAGNOSIS — U071 COVID-19: Secondary | ICD-10-CM | POA: Diagnosis not present

## 2021-10-21 DIAGNOSIS — J209 Acute bronchitis, unspecified: Secondary | ICD-10-CM | POA: Diagnosis not present

## 2021-10-21 DIAGNOSIS — Z885 Allergy status to narcotic agent status: Secondary | ICD-10-CM | POA: Diagnosis not present

## 2021-10-21 DIAGNOSIS — F419 Anxiety disorder, unspecified: Secondary | ICD-10-CM | POA: Diagnosis not present

## 2021-10-21 DIAGNOSIS — J440 Chronic obstructive pulmonary disease with acute lower respiratory infection: Secondary | ICD-10-CM | POA: Diagnosis not present

## 2021-10-21 DIAGNOSIS — M797 Fibromyalgia: Secondary | ICD-10-CM | POA: Diagnosis not present

## 2021-10-21 DIAGNOSIS — Z91041 Radiographic dye allergy status: Secondary | ICD-10-CM | POA: Diagnosis not present

## 2021-10-21 DIAGNOSIS — F32A Depression, unspecified: Secondary | ICD-10-CM | POA: Diagnosis not present

## 2021-10-21 DIAGNOSIS — R42 Dizziness and giddiness: Secondary | ICD-10-CM | POA: Diagnosis not present

## 2021-10-21 DIAGNOSIS — Z888 Allergy status to other drugs, medicaments and biological substances status: Secondary | ICD-10-CM | POA: Diagnosis not present

## 2021-10-21 DIAGNOSIS — R0600 Dyspnea, unspecified: Secondary | ICD-10-CM | POA: Diagnosis not present

## 2021-10-21 DIAGNOSIS — I110 Hypertensive heart disease with heart failure: Secondary | ICD-10-CM | POA: Diagnosis not present

## 2021-10-21 DIAGNOSIS — I509 Heart failure, unspecified: Secondary | ICD-10-CM | POA: Diagnosis not present

## 2021-10-21 DIAGNOSIS — D509 Iron deficiency anemia, unspecified: Secondary | ICD-10-CM | POA: Diagnosis not present

## 2021-10-21 DIAGNOSIS — R030 Elevated blood-pressure reading, without diagnosis of hypertension: Secondary | ICD-10-CM | POA: Diagnosis not present

## 2021-10-21 DIAGNOSIS — R531 Weakness: Secondary | ICD-10-CM | POA: Diagnosis not present

## 2021-10-21 DIAGNOSIS — I2510 Atherosclerotic heart disease of native coronary artery without angina pectoris: Secondary | ICD-10-CM | POA: Diagnosis not present

## 2021-10-21 DIAGNOSIS — R609 Edema, unspecified: Secondary | ICD-10-CM | POA: Diagnosis not present

## 2021-10-21 DIAGNOSIS — R0602 Shortness of breath: Secondary | ICD-10-CM | POA: Diagnosis not present

## 2021-10-21 DIAGNOSIS — Z87891 Personal history of nicotine dependence: Secondary | ICD-10-CM | POA: Diagnosis not present

## 2021-10-21 DIAGNOSIS — I10 Essential (primary) hypertension: Secondary | ICD-10-CM | POA: Diagnosis not present

## 2021-10-21 DIAGNOSIS — I4891 Unspecified atrial fibrillation: Secondary | ICD-10-CM | POA: Diagnosis not present

## 2021-10-21 DIAGNOSIS — R519 Headache, unspecified: Secondary | ICD-10-CM | POA: Diagnosis not present

## 2021-10-21 DIAGNOSIS — K219 Gastro-esophageal reflux disease without esophagitis: Secondary | ICD-10-CM | POA: Diagnosis not present

## 2021-12-04 DIAGNOSIS — S40262A Insect bite (nonvenomous) of left shoulder, initial encounter: Secondary | ICD-10-CM | POA: Diagnosis not present

## 2022-01-03 DIAGNOSIS — E876 Hypokalemia: Secondary | ICD-10-CM | POA: Diagnosis not present

## 2022-01-03 DIAGNOSIS — D649 Anemia, unspecified: Secondary | ICD-10-CM | POA: Diagnosis not present

## 2022-01-03 DIAGNOSIS — I214 Non-ST elevation (NSTEMI) myocardial infarction: Secondary | ICD-10-CM | POA: Diagnosis not present

## 2022-01-03 DIAGNOSIS — Z79899 Other long term (current) drug therapy: Secondary | ICD-10-CM | POA: Diagnosis not present

## 2022-01-03 DIAGNOSIS — K449 Diaphragmatic hernia without obstruction or gangrene: Secondary | ICD-10-CM | POA: Diagnosis present

## 2022-01-03 DIAGNOSIS — Q2733 Arteriovenous malformation of digestive system vessel: Secondary | ICD-10-CM | POA: Diagnosis not present

## 2022-01-03 DIAGNOSIS — Z8719 Personal history of other diseases of the digestive system: Secondary | ICD-10-CM | POA: Diagnosis not present

## 2022-01-03 DIAGNOSIS — I2510 Atherosclerotic heart disease of native coronary artery without angina pectoris: Secondary | ICD-10-CM | POA: Diagnosis present

## 2022-01-03 DIAGNOSIS — I10 Essential (primary) hypertension: Secondary | ICD-10-CM | POA: Diagnosis not present

## 2022-01-03 DIAGNOSIS — I272 Pulmonary hypertension, unspecified: Secondary | ICD-10-CM | POA: Diagnosis present

## 2022-01-03 DIAGNOSIS — Z881 Allergy status to other antibiotic agents status: Secondary | ICD-10-CM | POA: Diagnosis not present

## 2022-01-03 DIAGNOSIS — K219 Gastro-esophageal reflux disease without esophagitis: Secondary | ICD-10-CM | POA: Diagnosis present

## 2022-01-03 DIAGNOSIS — I5A Non-ischemic myocardial injury (non-traumatic): Secondary | ICD-10-CM | POA: Diagnosis not present

## 2022-01-03 DIAGNOSIS — Z20822 Contact with and (suspected) exposure to covid-19: Secondary | ICD-10-CM | POA: Diagnosis present

## 2022-01-03 DIAGNOSIS — I482 Chronic atrial fibrillation, unspecified: Secondary | ICD-10-CM | POA: Diagnosis present

## 2022-01-03 DIAGNOSIS — F419 Anxiety disorder, unspecified: Secondary | ICD-10-CM | POA: Diagnosis present

## 2022-01-03 DIAGNOSIS — F32A Depression, unspecified: Secondary | ICD-10-CM | POA: Diagnosis present

## 2022-01-03 DIAGNOSIS — I700 Atherosclerosis of aorta: Secondary | ICD-10-CM | POA: Diagnosis not present

## 2022-01-03 DIAGNOSIS — R5381 Other malaise: Secondary | ICD-10-CM | POA: Diagnosis not present

## 2022-01-03 DIAGNOSIS — I959 Hypotension, unspecified: Secondary | ICD-10-CM | POA: Diagnosis not present

## 2022-01-03 DIAGNOSIS — N3000 Acute cystitis without hematuria: Secondary | ICD-10-CM | POA: Diagnosis present

## 2022-01-03 DIAGNOSIS — I083 Combined rheumatic disorders of mitral, aortic and tricuspid valves: Secondary | ICD-10-CM | POA: Diagnosis present

## 2022-01-03 DIAGNOSIS — I4891 Unspecified atrial fibrillation: Secondary | ICD-10-CM | POA: Diagnosis not present

## 2022-01-03 DIAGNOSIS — D419 Neoplasm of uncertain behavior of unspecified urinary organ: Secondary | ICD-10-CM | POA: Diagnosis not present

## 2022-01-03 DIAGNOSIS — R531 Weakness: Secondary | ICD-10-CM | POA: Diagnosis not present

## 2022-01-03 DIAGNOSIS — Z792 Long term (current) use of antibiotics: Secondary | ICD-10-CM | POA: Diagnosis not present

## 2022-01-03 DIAGNOSIS — D638 Anemia in other chronic diseases classified elsewhere: Secondary | ICD-10-CM | POA: Diagnosis present

## 2022-01-03 DIAGNOSIS — Z452 Encounter for adjustment and management of vascular access device: Secondary | ICD-10-CM | POA: Diagnosis not present

## 2022-01-03 DIAGNOSIS — I21A1 Myocardial infarction type 2: Secondary | ICD-10-CM | POA: Diagnosis present

## 2022-01-03 DIAGNOSIS — M797 Fibromyalgia: Secondary | ICD-10-CM | POA: Diagnosis present

## 2022-01-03 DIAGNOSIS — Z7982 Long term (current) use of aspirin: Secondary | ICD-10-CM | POA: Diagnosis not present

## 2022-01-03 DIAGNOSIS — J449 Chronic obstructive pulmonary disease, unspecified: Secondary | ICD-10-CM | POA: Diagnosis present

## 2022-01-03 DIAGNOSIS — R000 Tachycardia, unspecified: Secondary | ICD-10-CM | POA: Diagnosis not present

## 2022-01-03 DIAGNOSIS — I5032 Chronic diastolic (congestive) heart failure: Secondary | ICD-10-CM | POA: Diagnosis present

## 2022-01-03 DIAGNOSIS — R778 Other specified abnormalities of plasma proteins: Secondary | ICD-10-CM | POA: Diagnosis not present

## 2022-01-03 DIAGNOSIS — D509 Iron deficiency anemia, unspecified: Secondary | ICD-10-CM | POA: Diagnosis not present

## 2022-01-03 DIAGNOSIS — I248 Other forms of acute ischemic heart disease: Secondary | ICD-10-CM | POA: Diagnosis not present

## 2022-01-03 DIAGNOSIS — I110 Hypertensive heart disease with heart failure: Secondary | ICD-10-CM | POA: Diagnosis present

## 2022-01-03 DIAGNOSIS — K922 Gastrointestinal hemorrhage, unspecified: Secondary | ICD-10-CM | POA: Diagnosis not present

## 2022-01-03 DIAGNOSIS — K5520 Angiodysplasia of colon without hemorrhage: Secondary | ICD-10-CM | POA: Diagnosis present

## 2022-01-03 DIAGNOSIS — B9620 Unspecified Escherichia coli [E. coli] as the cause of diseases classified elsewhere: Secondary | ICD-10-CM | POA: Diagnosis not present

## 2022-01-03 DIAGNOSIS — R079 Chest pain, unspecified: Secondary | ICD-10-CM | POA: Diagnosis not present

## 2022-01-03 DIAGNOSIS — Z87891 Personal history of nicotine dependence: Secondary | ICD-10-CM | POA: Diagnosis not present

## 2022-01-03 DIAGNOSIS — Z885 Allergy status to narcotic agent status: Secondary | ICD-10-CM | POA: Diagnosis not present

## 2022-01-03 DIAGNOSIS — R54 Age-related physical debility: Secondary | ICD-10-CM | POA: Diagnosis not present

## 2022-01-03 DIAGNOSIS — Z888 Allergy status to other drugs, medicaments and biological substances status: Secondary | ICD-10-CM | POA: Diagnosis not present

## 2022-01-03 DIAGNOSIS — R509 Fever, unspecified: Secondary | ICD-10-CM | POA: Diagnosis not present

## 2022-01-03 DIAGNOSIS — I509 Heart failure, unspecified: Secondary | ICD-10-CM | POA: Diagnosis not present

## 2022-01-03 DIAGNOSIS — H9193 Unspecified hearing loss, bilateral: Secondary | ICD-10-CM | POA: Diagnosis not present

## 2022-01-03 DIAGNOSIS — Z66 Do not resuscitate: Secondary | ICD-10-CM | POA: Diagnosis present

## 2022-01-04 DIAGNOSIS — I509 Heart failure, unspecified: Secondary | ICD-10-CM | POA: Diagnosis not present

## 2022-01-04 DIAGNOSIS — Z8719 Personal history of other diseases of the digestive system: Secondary | ICD-10-CM | POA: Diagnosis not present

## 2022-01-04 DIAGNOSIS — Z20822 Contact with and (suspected) exposure to covid-19: Secondary | ICD-10-CM | POA: Diagnosis present

## 2022-01-04 DIAGNOSIS — D509 Iron deficiency anemia, unspecified: Secondary | ICD-10-CM | POA: Diagnosis not present

## 2022-01-04 DIAGNOSIS — E876 Hypokalemia: Secondary | ICD-10-CM | POA: Diagnosis not present

## 2022-01-04 DIAGNOSIS — I700 Atherosclerosis of aorta: Secondary | ICD-10-CM | POA: Diagnosis not present

## 2022-01-04 DIAGNOSIS — K922 Gastrointestinal hemorrhage, unspecified: Secondary | ICD-10-CM | POA: Diagnosis not present

## 2022-01-04 DIAGNOSIS — Z7982 Long term (current) use of aspirin: Secondary | ICD-10-CM | POA: Diagnosis not present

## 2022-01-04 DIAGNOSIS — J449 Chronic obstructive pulmonary disease, unspecified: Secondary | ICD-10-CM | POA: Diagnosis present

## 2022-01-04 DIAGNOSIS — I272 Pulmonary hypertension, unspecified: Secondary | ICD-10-CM | POA: Diagnosis present

## 2022-01-04 DIAGNOSIS — I5032 Chronic diastolic (congestive) heart failure: Secondary | ICD-10-CM | POA: Diagnosis present

## 2022-01-04 DIAGNOSIS — D419 Neoplasm of uncertain behavior of unspecified urinary organ: Secondary | ICD-10-CM | POA: Diagnosis not present

## 2022-01-04 DIAGNOSIS — Z66 Do not resuscitate: Secondary | ICD-10-CM | POA: Diagnosis present

## 2022-01-04 DIAGNOSIS — R778 Other specified abnormalities of plasma proteins: Secondary | ICD-10-CM | POA: Diagnosis not present

## 2022-01-04 DIAGNOSIS — Z87891 Personal history of nicotine dependence: Secondary | ICD-10-CM | POA: Diagnosis not present

## 2022-01-04 DIAGNOSIS — R531 Weakness: Secondary | ICD-10-CM | POA: Diagnosis not present

## 2022-01-04 DIAGNOSIS — I21A1 Myocardial infarction type 2: Secondary | ICD-10-CM | POA: Diagnosis present

## 2022-01-04 DIAGNOSIS — R54 Age-related physical debility: Secondary | ICD-10-CM | POA: Diagnosis not present

## 2022-01-04 DIAGNOSIS — D638 Anemia in other chronic diseases classified elsewhere: Secondary | ICD-10-CM | POA: Diagnosis present

## 2022-01-04 DIAGNOSIS — K5520 Angiodysplasia of colon without hemorrhage: Secondary | ICD-10-CM | POA: Diagnosis present

## 2022-01-04 DIAGNOSIS — Z79899 Other long term (current) drug therapy: Secondary | ICD-10-CM | POA: Diagnosis not present

## 2022-01-04 DIAGNOSIS — Z452 Encounter for adjustment and management of vascular access device: Secondary | ICD-10-CM | POA: Diagnosis not present

## 2022-01-04 DIAGNOSIS — I248 Other forms of acute ischemic heart disease: Secondary | ICD-10-CM | POA: Diagnosis not present

## 2022-01-04 DIAGNOSIS — M797 Fibromyalgia: Secondary | ICD-10-CM | POA: Diagnosis present

## 2022-01-04 DIAGNOSIS — Z792 Long term (current) use of antibiotics: Secondary | ICD-10-CM | POA: Diagnosis not present

## 2022-01-04 DIAGNOSIS — Z881 Allergy status to other antibiotic agents status: Secondary | ICD-10-CM | POA: Diagnosis not present

## 2022-01-04 DIAGNOSIS — I482 Chronic atrial fibrillation, unspecified: Secondary | ICD-10-CM | POA: Diagnosis present

## 2022-01-04 DIAGNOSIS — I083 Combined rheumatic disorders of mitral, aortic and tricuspid valves: Secondary | ICD-10-CM | POA: Diagnosis present

## 2022-01-04 DIAGNOSIS — K219 Gastro-esophageal reflux disease without esophagitis: Secondary | ICD-10-CM | POA: Diagnosis present

## 2022-01-04 DIAGNOSIS — N3000 Acute cystitis without hematuria: Secondary | ICD-10-CM | POA: Diagnosis present

## 2022-01-04 DIAGNOSIS — I110 Hypertensive heart disease with heart failure: Secondary | ICD-10-CM | POA: Diagnosis present

## 2022-01-04 DIAGNOSIS — R5381 Other malaise: Secondary | ICD-10-CM | POA: Diagnosis not present

## 2022-01-04 DIAGNOSIS — F419 Anxiety disorder, unspecified: Secondary | ICD-10-CM | POA: Diagnosis present

## 2022-01-04 DIAGNOSIS — R509 Fever, unspecified: Secondary | ICD-10-CM | POA: Diagnosis not present

## 2022-01-04 DIAGNOSIS — K449 Diaphragmatic hernia without obstruction or gangrene: Secondary | ICD-10-CM | POA: Diagnosis present

## 2022-01-04 DIAGNOSIS — H9193 Unspecified hearing loss, bilateral: Secondary | ICD-10-CM | POA: Diagnosis not present

## 2022-01-04 DIAGNOSIS — F32A Depression, unspecified: Secondary | ICD-10-CM | POA: Diagnosis present

## 2022-01-04 DIAGNOSIS — Q2733 Arteriovenous malformation of digestive system vessel: Secondary | ICD-10-CM | POA: Diagnosis not present

## 2022-01-04 DIAGNOSIS — I4891 Unspecified atrial fibrillation: Secondary | ICD-10-CM | POA: Diagnosis not present

## 2022-01-04 DIAGNOSIS — Z888 Allergy status to other drugs, medicaments and biological substances status: Secondary | ICD-10-CM | POA: Diagnosis not present

## 2022-01-04 DIAGNOSIS — B9620 Unspecified Escherichia coli [E. coli] as the cause of diseases classified elsewhere: Secondary | ICD-10-CM | POA: Diagnosis not present

## 2022-01-04 DIAGNOSIS — Z885 Allergy status to narcotic agent status: Secondary | ICD-10-CM | POA: Diagnosis not present

## 2022-01-04 DIAGNOSIS — I5A Non-ischemic myocardial injury (non-traumatic): Secondary | ICD-10-CM | POA: Diagnosis not present

## 2022-01-04 DIAGNOSIS — D649 Anemia, unspecified: Secondary | ICD-10-CM | POA: Diagnosis not present

## 2022-01-04 DIAGNOSIS — I2510 Atherosclerotic heart disease of native coronary artery without angina pectoris: Secondary | ICD-10-CM | POA: Diagnosis present

## 2022-01-04 DIAGNOSIS — I214 Non-ST elevation (NSTEMI) myocardial infarction: Secondary | ICD-10-CM | POA: Diagnosis not present

## 2022-01-17 ENCOUNTER — Ambulatory Visit: Payer: Self-pay | Admitting: *Deleted

## 2022-01-17 NOTE — Patient Outreach (Signed)
  Care Coordination TOC Note Transition Care Management Follow-up Telephone Call Date of discharge and from where: 01/15/22 [facility] How have you been since you were released from the hospital? NOT WELL, BP HIGH, WEAK;  DO NOT HAVE PRESCRIPTIONS FOR NEW MEDICATIONS Any questions or concerns? Yes, VERY FRUSTRATED AND UPSET SHE DID NOT GET PRESCRIPTIONS ON DISCHARGE AND HAVING TROUBLE GETTING IN TOUCH WITH PROVIDERS AT HOSPITAL  Items Reviewed: Did the pt receive and understand the discharge instructions provided? No  Medications obtained and verified? No  Other? No  Any new allergies since your discharge? No  Dietary orders reviewed? No Do you have support at home? No   LIVES AT HOME ALONE BUT STATES CAN GET ASSISTANCE FROM NEIGHBORS AND FRIENDS  Home Care and Equipment/Supplies: Were home health services ordered? yes If so, what is the name of the agency? PATIENT UNSURE  Has the agency set up a time to come to the patient's home? Yes 1 PM TODAY; PATIENT STATING SHE DOES NOT FEEL LIKE [place] AND WILL TELL [facility] WHEN SHE ARRIVES; RNCM TRIED TO ENCOURAGE PATIENT TO WORK WITH [facility] IN GETTING MEDICATION PRESCRIPTIONS  Were any new equipment or medical supplies ordered?  No What is the name of the medical supply agency? NA Were you able to get the supplies/equipment? not applicable Do you have any questions related to the use of the equipment or supplies? No  Functional Questionnaire: (I = Independent and D = Dependent) ADLs: I  Bathing/Dressing- I  Meal Prep- I  Eating- I  Maintaining continence- I  Transferring/Ambulation- I  Managing Meds- I  Follow up appointments reviewed:  PCP Hospital f/u appt confirmed? No  ; DOES NOT HAVE PCP   AN DECLINES ASSISTANCE IN OBTAINING ONE AT THIS TIME. [facility] f/u appt confirmed? Yes  Scheduled to see CARDIOLOGY NOT SURE IF DATE AND TIME  Are transportation arrangements needed?  UNSURE PATIENT DI NOT ANSWER If their condition worsens, is the pt aware to call PCP or go to the Emergency Dept.? Yes Was the patient provided with contact information for the PCP's office or ED? Yes Was to pt encouraged to call back with questions or concerns? Yes  SDOH assessments and interventions completed:   No  Care Coordination Interventions Activated:  Yes Care Coordination Interventions:   CONTACTED [facility] AND SPOKE WITH OFFICE MANAGER , APRIL, NOTIFYING PATIENT WITHOUT MEDICATIONS .  Encounter Outcome:  Pt. Refused TO CONTINUE CALL.  RNCM REQUESTED TO [place] TO ASSIST WITH OBTAINING SCRIPTS FOR MEDICATIONS AND PATIENT DECLINED ASSISTANCE. STATED SHE WOULD  DO IT HERSELF.

## 2022-01-31 DIAGNOSIS — Z66 Do not resuscitate: Secondary | ICD-10-CM | POA: Diagnosis present

## 2022-01-31 DIAGNOSIS — J449 Chronic obstructive pulmonary disease, unspecified: Secondary | ICD-10-CM | POA: Diagnosis not present

## 2022-01-31 DIAGNOSIS — Z7982 Long term (current) use of aspirin: Secondary | ICD-10-CM | POA: Diagnosis not present

## 2022-01-31 DIAGNOSIS — I10 Essential (primary) hypertension: Secondary | ICD-10-CM | POA: Diagnosis not present

## 2022-01-31 DIAGNOSIS — R0602 Shortness of breath: Secondary | ICD-10-CM | POA: Diagnosis not present

## 2022-01-31 DIAGNOSIS — I110 Hypertensive heart disease with heart failure: Secondary | ICD-10-CM | POA: Diagnosis present

## 2022-01-31 DIAGNOSIS — Z881 Allergy status to other antibiotic agents status: Secondary | ICD-10-CM | POA: Diagnosis not present

## 2022-01-31 DIAGNOSIS — R069 Unspecified abnormalities of breathing: Secondary | ICD-10-CM | POA: Diagnosis not present

## 2022-01-31 DIAGNOSIS — J45909 Unspecified asthma, uncomplicated: Secondary | ICD-10-CM | POA: Diagnosis not present

## 2022-01-31 DIAGNOSIS — D649 Anemia, unspecified: Secondary | ICD-10-CM | POA: Diagnosis present

## 2022-01-31 DIAGNOSIS — I272 Pulmonary hypertension, unspecified: Secondary | ICD-10-CM | POA: Diagnosis present

## 2022-01-31 DIAGNOSIS — F32A Depression, unspecified: Secondary | ICD-10-CM | POA: Diagnosis not present

## 2022-01-31 DIAGNOSIS — J440 Chronic obstructive pulmonary disease with acute lower respiratory infection: Secondary | ICD-10-CM | POA: Diagnosis present

## 2022-01-31 DIAGNOSIS — R52 Pain, unspecified: Secondary | ICD-10-CM | POA: Diagnosis not present

## 2022-01-31 DIAGNOSIS — Z79899 Other long term (current) drug therapy: Secondary | ICD-10-CM | POA: Diagnosis not present

## 2022-01-31 DIAGNOSIS — J189 Pneumonia, unspecified organism: Secondary | ICD-10-CM | POA: Diagnosis present

## 2022-01-31 DIAGNOSIS — Z87891 Personal history of nicotine dependence: Secondary | ICD-10-CM | POA: Diagnosis not present

## 2022-01-31 DIAGNOSIS — J9811 Atelectasis: Secondary | ICD-10-CM | POA: Diagnosis not present

## 2022-01-31 DIAGNOSIS — F419 Anxiety disorder, unspecified: Secondary | ICD-10-CM | POA: Diagnosis present

## 2022-01-31 DIAGNOSIS — I4891 Unspecified atrial fibrillation: Secondary | ICD-10-CM | POA: Diagnosis present

## 2022-01-31 DIAGNOSIS — M797 Fibromyalgia: Secondary | ICD-10-CM | POA: Diagnosis present

## 2022-01-31 DIAGNOSIS — I509 Heart failure, unspecified: Secondary | ICD-10-CM | POA: Diagnosis not present

## 2022-01-31 DIAGNOSIS — Z8711 Personal history of peptic ulcer disease: Secondary | ICD-10-CM | POA: Diagnosis not present

## 2022-01-31 DIAGNOSIS — Q2733 Arteriovenous malformation of digestive system vessel: Secondary | ICD-10-CM | POA: Diagnosis not present

## 2022-01-31 DIAGNOSIS — I5033 Acute on chronic diastolic (congestive) heart failure: Secondary | ICD-10-CM | POA: Diagnosis present

## 2022-01-31 DIAGNOSIS — E039 Hypothyroidism, unspecified: Secondary | ICD-10-CM | POA: Diagnosis not present

## 2022-01-31 DIAGNOSIS — I25810 Atherosclerosis of coronary artery bypass graft(s) without angina pectoris: Secondary | ICD-10-CM | POA: Diagnosis present

## 2022-01-31 DIAGNOSIS — J90 Pleural effusion, not elsewhere classified: Secondary | ICD-10-CM | POA: Diagnosis not present

## 2022-01-31 DIAGNOSIS — F332 Major depressive disorder, recurrent severe without psychotic features: Secondary | ICD-10-CM | POA: Diagnosis present

## 2022-01-31 DIAGNOSIS — I2510 Atherosclerotic heart disease of native coronary artery without angina pectoris: Secondary | ICD-10-CM | POA: Diagnosis not present

## 2022-01-31 DIAGNOSIS — I081 Rheumatic disorders of both mitral and tricuspid valves: Secondary | ICD-10-CM | POA: Diagnosis present

## 2022-01-31 DIAGNOSIS — K5520 Angiodysplasia of colon without hemorrhage: Secondary | ICD-10-CM | POA: Diagnosis present

## 2022-01-31 DIAGNOSIS — R079 Chest pain, unspecified: Secondary | ICD-10-CM | POA: Diagnosis not present

## 2022-01-31 DIAGNOSIS — E7800 Pure hypercholesterolemia, unspecified: Secondary | ICD-10-CM | POA: Diagnosis present

## 2022-01-31 DIAGNOSIS — I491 Atrial premature depolarization: Secondary | ICD-10-CM | POA: Diagnosis not present

## 2022-01-31 DIAGNOSIS — R54 Age-related physical debility: Secondary | ICD-10-CM | POA: Diagnosis present

## 2022-01-31 DIAGNOSIS — H9193 Unspecified hearing loss, bilateral: Secondary | ICD-10-CM | POA: Diagnosis present

## 2022-01-31 DIAGNOSIS — F329 Major depressive disorder, single episode, unspecified: Secondary | ICD-10-CM | POA: Diagnosis not present

## 2022-01-31 DIAGNOSIS — J441 Chronic obstructive pulmonary disease with (acute) exacerbation: Secondary | ICD-10-CM | POA: Diagnosis present

## 2022-01-31 DIAGNOSIS — Z955 Presence of coronary angioplasty implant and graft: Secondary | ICD-10-CM | POA: Diagnosis not present

## 2022-01-31 DIAGNOSIS — Z79890 Hormone replacement therapy (postmenopausal): Secondary | ICD-10-CM | POA: Diagnosis not present

## 2022-01-31 DIAGNOSIS — J811 Chronic pulmonary edema: Secondary | ICD-10-CM | POA: Diagnosis not present

## 2022-02-01 DIAGNOSIS — Z79890 Hormone replacement therapy (postmenopausal): Secondary | ICD-10-CM | POA: Diagnosis not present

## 2022-02-01 DIAGNOSIS — Z955 Presence of coronary angioplasty implant and graft: Secondary | ICD-10-CM | POA: Diagnosis not present

## 2022-02-01 DIAGNOSIS — F332 Major depressive disorder, recurrent severe without psychotic features: Secondary | ICD-10-CM | POA: Diagnosis present

## 2022-02-01 DIAGNOSIS — R079 Chest pain, unspecified: Secondary | ICD-10-CM | POA: Diagnosis not present

## 2022-02-01 DIAGNOSIS — F419 Anxiety disorder, unspecified: Secondary | ICD-10-CM | POA: Diagnosis present

## 2022-02-01 DIAGNOSIS — J90 Pleural effusion, not elsewhere classified: Secondary | ICD-10-CM | POA: Diagnosis not present

## 2022-02-01 DIAGNOSIS — E7800 Pure hypercholesterolemia, unspecified: Secondary | ICD-10-CM | POA: Diagnosis present

## 2022-02-01 DIAGNOSIS — H9193 Unspecified hearing loss, bilateral: Secondary | ICD-10-CM | POA: Diagnosis present

## 2022-02-01 DIAGNOSIS — Q2733 Arteriovenous malformation of digestive system vessel: Secondary | ICD-10-CM | POA: Diagnosis not present

## 2022-02-01 DIAGNOSIS — Z79899 Other long term (current) drug therapy: Secondary | ICD-10-CM | POA: Diagnosis not present

## 2022-02-01 DIAGNOSIS — D649 Anemia, unspecified: Secondary | ICD-10-CM | POA: Diagnosis present

## 2022-02-01 DIAGNOSIS — I272 Pulmonary hypertension, unspecified: Secondary | ICD-10-CM | POA: Diagnosis present

## 2022-02-01 DIAGNOSIS — I110 Hypertensive heart disease with heart failure: Secondary | ICD-10-CM | POA: Diagnosis present

## 2022-02-01 DIAGNOSIS — J811 Chronic pulmonary edema: Secondary | ICD-10-CM | POA: Diagnosis not present

## 2022-02-01 DIAGNOSIS — E039 Hypothyroidism, unspecified: Secondary | ICD-10-CM | POA: Diagnosis not present

## 2022-02-01 DIAGNOSIS — I2510 Atherosclerotic heart disease of native coronary artery without angina pectoris: Secondary | ICD-10-CM | POA: Diagnosis present

## 2022-02-01 DIAGNOSIS — Z881 Allergy status to other antibiotic agents status: Secondary | ICD-10-CM | POA: Diagnosis not present

## 2022-02-01 DIAGNOSIS — Z66 Do not resuscitate: Secondary | ICD-10-CM | POA: Diagnosis present

## 2022-02-01 DIAGNOSIS — I081 Rheumatic disorders of both mitral and tricuspid valves: Secondary | ICD-10-CM | POA: Diagnosis present

## 2022-02-01 DIAGNOSIS — I4891 Unspecified atrial fibrillation: Secondary | ICD-10-CM | POA: Diagnosis present

## 2022-02-01 DIAGNOSIS — J440 Chronic obstructive pulmonary disease with acute lower respiratory infection: Secondary | ICD-10-CM | POA: Diagnosis present

## 2022-02-01 DIAGNOSIS — J189 Pneumonia, unspecified organism: Secondary | ICD-10-CM | POA: Diagnosis present

## 2022-02-01 DIAGNOSIS — Z7982 Long term (current) use of aspirin: Secondary | ICD-10-CM | POA: Diagnosis not present

## 2022-02-01 DIAGNOSIS — I509 Heart failure, unspecified: Secondary | ICD-10-CM | POA: Diagnosis not present

## 2022-02-01 DIAGNOSIS — I5033 Acute on chronic diastolic (congestive) heart failure: Secondary | ICD-10-CM | POA: Diagnosis present

## 2022-02-01 DIAGNOSIS — J441 Chronic obstructive pulmonary disease with (acute) exacerbation: Secondary | ICD-10-CM | POA: Diagnosis present

## 2022-02-01 DIAGNOSIS — R54 Age-related physical debility: Secondary | ICD-10-CM | POA: Diagnosis present

## 2022-02-01 DIAGNOSIS — Z8711 Personal history of peptic ulcer disease: Secondary | ICD-10-CM | POA: Diagnosis not present

## 2022-02-01 DIAGNOSIS — K5520 Angiodysplasia of colon without hemorrhage: Secondary | ICD-10-CM | POA: Diagnosis present

## 2022-02-01 DIAGNOSIS — J449 Chronic obstructive pulmonary disease, unspecified: Secondary | ICD-10-CM | POA: Diagnosis not present

## 2022-02-01 DIAGNOSIS — M797 Fibromyalgia: Secondary | ICD-10-CM | POA: Diagnosis present

## 2022-02-01 DIAGNOSIS — F329 Major depressive disorder, single episode, unspecified: Secondary | ICD-10-CM | POA: Diagnosis not present

## 2022-02-01 DIAGNOSIS — F32A Depression, unspecified: Secondary | ICD-10-CM | POA: Diagnosis not present

## 2022-02-01 DIAGNOSIS — Z87891 Personal history of nicotine dependence: Secondary | ICD-10-CM | POA: Diagnosis not present

## 2022-02-01 DIAGNOSIS — I25810 Atherosclerosis of coronary artery bypass graft(s) without angina pectoris: Secondary | ICD-10-CM | POA: Diagnosis present

## 2022-02-02 DIAGNOSIS — F419 Anxiety disorder, unspecified: Secondary | ICD-10-CM | POA: Diagnosis not present

## 2022-02-02 DIAGNOSIS — E039 Hypothyroidism, unspecified: Secondary | ICD-10-CM | POA: Diagnosis not present

## 2022-02-02 DIAGNOSIS — I110 Hypertensive heart disease with heart failure: Secondary | ICD-10-CM | POA: Diagnosis not present

## 2022-02-02 DIAGNOSIS — I272 Pulmonary hypertension, unspecified: Secondary | ICD-10-CM | POA: Diagnosis not present

## 2022-02-02 DIAGNOSIS — Z7982 Long term (current) use of aspirin: Secondary | ICD-10-CM | POA: Diagnosis not present

## 2022-02-02 DIAGNOSIS — F32A Depression, unspecified: Secondary | ICD-10-CM | POA: Diagnosis not present

## 2022-02-02 DIAGNOSIS — I4891 Unspecified atrial fibrillation: Secondary | ICD-10-CM | POA: Diagnosis not present

## 2022-02-02 DIAGNOSIS — J90 Pleural effusion, not elsewhere classified: Secondary | ICD-10-CM | POA: Diagnosis not present

## 2022-02-02 DIAGNOSIS — I2510 Atherosclerotic heart disease of native coronary artery without angina pectoris: Secondary | ICD-10-CM | POA: Diagnosis not present

## 2022-02-02 DIAGNOSIS — Z79899 Other long term (current) drug therapy: Secondary | ICD-10-CM | POA: Diagnosis not present

## 2022-02-02 DIAGNOSIS — I509 Heart failure, unspecified: Secondary | ICD-10-CM | POA: Diagnosis not present

## 2022-02-02 DIAGNOSIS — I5033 Acute on chronic diastolic (congestive) heart failure: Secondary | ICD-10-CM | POA: Diagnosis not present

## 2022-02-02 DIAGNOSIS — Z79890 Hormone replacement therapy (postmenopausal): Secondary | ICD-10-CM | POA: Diagnosis not present

## 2022-02-03 DIAGNOSIS — E039 Hypothyroidism, unspecified: Secondary | ICD-10-CM | POA: Diagnosis not present

## 2022-02-03 DIAGNOSIS — F419 Anxiety disorder, unspecified: Secondary | ICD-10-CM | POA: Diagnosis not present

## 2022-02-03 DIAGNOSIS — I110 Hypertensive heart disease with heart failure: Secondary | ICD-10-CM | POA: Diagnosis not present

## 2022-02-03 DIAGNOSIS — I5033 Acute on chronic diastolic (congestive) heart failure: Secondary | ICD-10-CM | POA: Diagnosis not present

## 2022-02-03 DIAGNOSIS — I4891 Unspecified atrial fibrillation: Secondary | ICD-10-CM | POA: Diagnosis not present

## 2022-02-03 DIAGNOSIS — J811 Chronic pulmonary edema: Secondary | ICD-10-CM | POA: Diagnosis not present

## 2022-02-03 DIAGNOSIS — J90 Pleural effusion, not elsewhere classified: Secondary | ICD-10-CM | POA: Diagnosis not present

## 2022-02-03 DIAGNOSIS — Z79899 Other long term (current) drug therapy: Secondary | ICD-10-CM | POA: Diagnosis not present

## 2022-02-03 DIAGNOSIS — I2510 Atherosclerotic heart disease of native coronary artery without angina pectoris: Secondary | ICD-10-CM | POA: Diagnosis not present

## 2022-02-03 DIAGNOSIS — Z7982 Long term (current) use of aspirin: Secondary | ICD-10-CM | POA: Diagnosis not present

## 2022-02-03 DIAGNOSIS — F32A Depression, unspecified: Secondary | ICD-10-CM | POA: Diagnosis not present

## 2022-02-03 DIAGNOSIS — Z79890 Hormone replacement therapy (postmenopausal): Secondary | ICD-10-CM | POA: Diagnosis not present

## 2022-02-04 DIAGNOSIS — Z7982 Long term (current) use of aspirin: Secondary | ICD-10-CM | POA: Diagnosis not present

## 2022-02-04 DIAGNOSIS — I110 Hypertensive heart disease with heart failure: Secondary | ICD-10-CM | POA: Diagnosis not present

## 2022-02-04 DIAGNOSIS — I5033 Acute on chronic diastolic (congestive) heart failure: Secondary | ICD-10-CM | POA: Diagnosis not present

## 2022-02-04 DIAGNOSIS — J811 Chronic pulmonary edema: Secondary | ICD-10-CM | POA: Diagnosis not present

## 2022-02-04 DIAGNOSIS — F32A Depression, unspecified: Secondary | ICD-10-CM | POA: Diagnosis not present

## 2022-02-04 DIAGNOSIS — J90 Pleural effusion, not elsewhere classified: Secondary | ICD-10-CM | POA: Diagnosis not present

## 2022-02-04 DIAGNOSIS — E039 Hypothyroidism, unspecified: Secondary | ICD-10-CM | POA: Diagnosis not present

## 2022-02-04 DIAGNOSIS — I4891 Unspecified atrial fibrillation: Secondary | ICD-10-CM | POA: Diagnosis not present

## 2022-02-04 DIAGNOSIS — I2510 Atherosclerotic heart disease of native coronary artery without angina pectoris: Secondary | ICD-10-CM | POA: Diagnosis not present

## 2022-02-04 DIAGNOSIS — F419 Anxiety disorder, unspecified: Secondary | ICD-10-CM | POA: Diagnosis not present

## 2022-02-04 DIAGNOSIS — Z79899 Other long term (current) drug therapy: Secondary | ICD-10-CM | POA: Diagnosis not present

## 2022-02-04 DIAGNOSIS — Z79890 Hormone replacement therapy (postmenopausal): Secondary | ICD-10-CM | POA: Diagnosis not present

## 2022-02-05 DIAGNOSIS — F419 Anxiety disorder, unspecified: Secondary | ICD-10-CM | POA: Diagnosis not present

## 2022-02-05 DIAGNOSIS — E039 Hypothyroidism, unspecified: Secondary | ICD-10-CM | POA: Diagnosis not present

## 2022-02-05 DIAGNOSIS — J811 Chronic pulmonary edema: Secondary | ICD-10-CM | POA: Diagnosis not present

## 2022-02-05 DIAGNOSIS — I2510 Atherosclerotic heart disease of native coronary artery without angina pectoris: Secondary | ICD-10-CM | POA: Diagnosis not present

## 2022-02-05 DIAGNOSIS — Z79890 Hormone replacement therapy (postmenopausal): Secondary | ICD-10-CM | POA: Diagnosis not present

## 2022-02-05 DIAGNOSIS — I5033 Acute on chronic diastolic (congestive) heart failure: Secondary | ICD-10-CM | POA: Diagnosis not present

## 2022-02-05 DIAGNOSIS — Z7982 Long term (current) use of aspirin: Secondary | ICD-10-CM | POA: Diagnosis not present

## 2022-02-05 DIAGNOSIS — J90 Pleural effusion, not elsewhere classified: Secondary | ICD-10-CM | POA: Diagnosis not present

## 2022-02-05 DIAGNOSIS — I4891 Unspecified atrial fibrillation: Secondary | ICD-10-CM | POA: Diagnosis not present

## 2022-02-05 DIAGNOSIS — F32A Depression, unspecified: Secondary | ICD-10-CM | POA: Diagnosis not present

## 2022-02-05 DIAGNOSIS — Z79899 Other long term (current) drug therapy: Secondary | ICD-10-CM | POA: Diagnosis not present

## 2022-02-05 DIAGNOSIS — I110 Hypertensive heart disease with heart failure: Secondary | ICD-10-CM | POA: Diagnosis not present

## 2022-02-06 DIAGNOSIS — F32A Depression, unspecified: Secondary | ICD-10-CM | POA: Diagnosis not present

## 2022-02-06 DIAGNOSIS — I5033 Acute on chronic diastolic (congestive) heart failure: Secondary | ICD-10-CM | POA: Diagnosis not present

## 2022-02-06 DIAGNOSIS — Z79890 Hormone replacement therapy (postmenopausal): Secondary | ICD-10-CM | POA: Diagnosis not present

## 2022-02-06 DIAGNOSIS — I4891 Unspecified atrial fibrillation: Secondary | ICD-10-CM | POA: Diagnosis not present

## 2022-02-06 DIAGNOSIS — Z79899 Other long term (current) drug therapy: Secondary | ICD-10-CM | POA: Diagnosis not present

## 2022-02-06 DIAGNOSIS — I110 Hypertensive heart disease with heart failure: Secondary | ICD-10-CM | POA: Diagnosis not present

## 2022-02-06 DIAGNOSIS — I2510 Atherosclerotic heart disease of native coronary artery without angina pectoris: Secondary | ICD-10-CM | POA: Diagnosis not present

## 2022-02-06 DIAGNOSIS — Z7982 Long term (current) use of aspirin: Secondary | ICD-10-CM | POA: Diagnosis not present

## 2022-02-06 DIAGNOSIS — F419 Anxiety disorder, unspecified: Secondary | ICD-10-CM | POA: Diagnosis not present

## 2022-02-06 DIAGNOSIS — E039 Hypothyroidism, unspecified: Secondary | ICD-10-CM | POA: Diagnosis not present

## 2022-02-08 DIAGNOSIS — F332 Major depressive disorder, recurrent severe without psychotic features: Secondary | ICD-10-CM | POA: Diagnosis not present

## 2022-02-08 DIAGNOSIS — Q2733 Arteriovenous malformation of digestive system vessel: Secondary | ICD-10-CM | POA: Diagnosis not present

## 2022-02-08 DIAGNOSIS — I110 Hypertensive heart disease with heart failure: Secondary | ICD-10-CM | POA: Diagnosis not present

## 2022-02-08 DIAGNOSIS — R54 Age-related physical debility: Secondary | ICD-10-CM | POA: Diagnosis not present

## 2022-02-08 DIAGNOSIS — I509 Heart failure, unspecified: Secondary | ICD-10-CM | POA: Diagnosis not present

## 2022-02-08 DIAGNOSIS — I5033 Acute on chronic diastolic (congestive) heart failure: Secondary | ICD-10-CM | POA: Diagnosis not present

## 2022-02-08 DIAGNOSIS — I2510 Atherosclerotic heart disease of native coronary artery without angina pectoris: Secondary | ICD-10-CM | POA: Diagnosis not present

## 2022-02-08 DIAGNOSIS — J449 Chronic obstructive pulmonary disease, unspecified: Secondary | ICD-10-CM | POA: Diagnosis not present

## 2022-02-08 DIAGNOSIS — H9193 Unspecified hearing loss, bilateral: Secondary | ICD-10-CM | POA: Diagnosis not present

## 2022-02-08 DIAGNOSIS — I4891 Unspecified atrial fibrillation: Secondary | ICD-10-CM | POA: Diagnosis not present

## 2022-02-08 DIAGNOSIS — F419 Anxiety disorder, unspecified: Secondary | ICD-10-CM | POA: Diagnosis not present

## 2022-02-15 DIAGNOSIS — R0602 Shortness of breath: Secondary | ICD-10-CM | POA: Diagnosis not present

## 2022-03-27 DIAGNOSIS — E785 Hyperlipidemia, unspecified: Secondary | ICD-10-CM | POA: Diagnosis not present

## 2022-03-27 DIAGNOSIS — I4891 Unspecified atrial fibrillation: Secondary | ICD-10-CM | POA: Diagnosis not present

## 2022-03-27 DIAGNOSIS — Z87440 Personal history of urinary (tract) infections: Secondary | ICD-10-CM | POA: Diagnosis not present

## 2022-03-27 DIAGNOSIS — Z79899 Other long term (current) drug therapy: Secondary | ICD-10-CM | POA: Diagnosis not present

## 2022-03-27 DIAGNOSIS — F419 Anxiety disorder, unspecified: Secondary | ICD-10-CM | POA: Diagnosis not present

## 2022-03-27 DIAGNOSIS — E559 Vitamin D deficiency, unspecified: Secondary | ICD-10-CM | POA: Diagnosis not present

## 2022-03-27 DIAGNOSIS — D649 Anemia, unspecified: Secondary | ICD-10-CM | POA: Diagnosis not present

## 2022-03-27 DIAGNOSIS — I10 Essential (primary) hypertension: Secondary | ICD-10-CM | POA: Diagnosis not present

## 2022-03-27 DIAGNOSIS — F4310 Post-traumatic stress disorder, unspecified: Secondary | ICD-10-CM | POA: Diagnosis not present

## 2022-04-11 DIAGNOSIS — J189 Pneumonia, unspecified organism: Secondary | ICD-10-CM | POA: Diagnosis not present

## 2022-04-11 DIAGNOSIS — I5032 Chronic diastolic (congestive) heart failure: Secondary | ICD-10-CM | POA: Diagnosis not present

## 2022-04-11 DIAGNOSIS — Z87891 Personal history of nicotine dependence: Secondary | ICD-10-CM | POA: Diagnosis not present

## 2022-04-11 DIAGNOSIS — I252 Old myocardial infarction: Secondary | ICD-10-CM | POA: Diagnosis not present

## 2022-04-11 DIAGNOSIS — Z66 Do not resuscitate: Secondary | ICD-10-CM | POA: Diagnosis not present

## 2022-04-11 DIAGNOSIS — I5030 Unspecified diastolic (congestive) heart failure: Secondary | ICD-10-CM | POA: Diagnosis not present

## 2022-04-11 DIAGNOSIS — R000 Tachycardia, unspecified: Secondary | ICD-10-CM | POA: Diagnosis not present

## 2022-04-11 DIAGNOSIS — Z8719 Personal history of other diseases of the digestive system: Secondary | ICD-10-CM | POA: Diagnosis not present

## 2022-04-11 DIAGNOSIS — Z881 Allergy status to other antibiotic agents status: Secondary | ICD-10-CM | POA: Diagnosis not present

## 2022-04-11 DIAGNOSIS — Z885 Allergy status to narcotic agent status: Secondary | ICD-10-CM | POA: Diagnosis not present

## 2022-04-11 DIAGNOSIS — Z8669 Personal history of other diseases of the nervous system and sense organs: Secondary | ICD-10-CM | POA: Diagnosis not present

## 2022-04-11 DIAGNOSIS — D649 Anemia, unspecified: Secondary | ICD-10-CM | POA: Diagnosis not present

## 2022-04-11 DIAGNOSIS — I272 Pulmonary hypertension, unspecified: Secondary | ICD-10-CM | POA: Diagnosis not present

## 2022-04-11 DIAGNOSIS — R54 Age-related physical debility: Secondary | ICD-10-CM | POA: Diagnosis not present

## 2022-04-11 DIAGNOSIS — E079 Disorder of thyroid, unspecified: Secondary | ICD-10-CM | POA: Diagnosis not present

## 2022-04-11 DIAGNOSIS — E039 Hypothyroidism, unspecified: Secondary | ICD-10-CM | POA: Diagnosis not present

## 2022-04-11 DIAGNOSIS — I10 Essential (primary) hypertension: Secondary | ICD-10-CM | POA: Diagnosis not present

## 2022-04-11 DIAGNOSIS — Z888 Allergy status to other drugs, medicaments and biological substances status: Secondary | ICD-10-CM | POA: Diagnosis not present

## 2022-04-11 DIAGNOSIS — R7989 Other specified abnormal findings of blood chemistry: Secondary | ICD-10-CM | POA: Diagnosis not present

## 2022-04-11 DIAGNOSIS — I4891 Unspecified atrial fibrillation: Secondary | ICD-10-CM | POA: Diagnosis not present

## 2022-04-11 DIAGNOSIS — I2510 Atherosclerotic heart disease of native coronary artery without angina pectoris: Secondary | ICD-10-CM | POA: Diagnosis not present

## 2022-04-11 DIAGNOSIS — F419 Anxiety disorder, unspecified: Secondary | ICD-10-CM | POA: Diagnosis not present

## 2022-04-11 DIAGNOSIS — I482 Chronic atrial fibrillation, unspecified: Secondary | ICD-10-CM | POA: Diagnosis not present

## 2022-04-11 DIAGNOSIS — I509 Heart failure, unspecified: Secondary | ICD-10-CM | POA: Diagnosis not present

## 2022-04-11 DIAGNOSIS — K5520 Angiodysplasia of colon without hemorrhage: Secondary | ICD-10-CM | POA: Diagnosis not present

## 2022-04-11 DIAGNOSIS — Z7952 Long term (current) use of systemic steroids: Secondary | ICD-10-CM | POA: Diagnosis not present

## 2022-04-11 DIAGNOSIS — J449 Chronic obstructive pulmonary disease, unspecified: Secondary | ICD-10-CM | POA: Diagnosis not present

## 2022-04-11 DIAGNOSIS — J9811 Atelectasis: Secondary | ICD-10-CM | POA: Diagnosis not present

## 2022-04-11 DIAGNOSIS — I5031 Acute diastolic (congestive) heart failure: Secondary | ICD-10-CM | POA: Diagnosis not present

## 2022-04-11 DIAGNOSIS — Z91048 Other nonmedicinal substance allergy status: Secondary | ICD-10-CM | POA: Diagnosis not present

## 2022-04-11 DIAGNOSIS — F332 Major depressive disorder, recurrent severe without psychotic features: Secondary | ICD-10-CM | POA: Diagnosis not present

## 2022-04-11 DIAGNOSIS — I219 Acute myocardial infarction, unspecified: Secondary | ICD-10-CM | POA: Diagnosis not present

## 2022-04-11 DIAGNOSIS — R079 Chest pain, unspecified: Secondary | ICD-10-CM | POA: Diagnosis not present

## 2022-04-11 DIAGNOSIS — R0602 Shortness of breath: Secondary | ICD-10-CM | POA: Diagnosis not present

## 2022-04-11 DIAGNOSIS — Z79899 Other long term (current) drug therapy: Secondary | ICD-10-CM | POA: Diagnosis not present

## 2022-04-11 DIAGNOSIS — F4310 Post-traumatic stress disorder, unspecified: Secondary | ICD-10-CM | POA: Diagnosis not present

## 2022-04-11 DIAGNOSIS — Z79890 Hormone replacement therapy (postmenopausal): Secondary | ICD-10-CM | POA: Diagnosis not present

## 2022-04-11 DIAGNOSIS — J90 Pleural effusion, not elsewhere classified: Secondary | ICD-10-CM | POA: Diagnosis not present

## 2022-04-11 DIAGNOSIS — Z91041 Radiographic dye allergy status: Secondary | ICD-10-CM | POA: Diagnosis not present

## 2022-04-11 DIAGNOSIS — I110 Hypertensive heart disease with heart failure: Secondary | ICD-10-CM | POA: Diagnosis not present

## 2022-04-12 DIAGNOSIS — E079 Disorder of thyroid, unspecified: Secondary | ICD-10-CM | POA: Diagnosis not present

## 2022-04-12 DIAGNOSIS — I509 Heart failure, unspecified: Secondary | ICD-10-CM | POA: Diagnosis not present

## 2022-04-12 DIAGNOSIS — F4310 Post-traumatic stress disorder, unspecified: Secondary | ICD-10-CM | POA: Diagnosis not present

## 2022-04-12 DIAGNOSIS — D649 Anemia, unspecified: Secondary | ICD-10-CM | POA: Diagnosis not present

## 2022-04-12 DIAGNOSIS — I110 Hypertensive heart disease with heart failure: Secondary | ICD-10-CM | POA: Diagnosis not present

## 2022-04-12 DIAGNOSIS — I5032 Chronic diastolic (congestive) heart failure: Secondary | ICD-10-CM | POA: Diagnosis not present

## 2022-04-12 DIAGNOSIS — R54 Age-related physical debility: Secondary | ICD-10-CM | POA: Diagnosis not present

## 2022-04-12 DIAGNOSIS — I2510 Atherosclerotic heart disease of native coronary artery without angina pectoris: Secondary | ICD-10-CM | POA: Diagnosis not present

## 2022-04-12 DIAGNOSIS — Z79899 Other long term (current) drug therapy: Secondary | ICD-10-CM | POA: Diagnosis not present

## 2022-04-12 DIAGNOSIS — F419 Anxiety disorder, unspecified: Secondary | ICD-10-CM | POA: Diagnosis not present

## 2022-04-12 DIAGNOSIS — Z79890 Hormone replacement therapy (postmenopausal): Secondary | ICD-10-CM | POA: Diagnosis not present

## 2022-04-12 DIAGNOSIS — R7989 Other specified abnormal findings of blood chemistry: Secondary | ICD-10-CM | POA: Diagnosis not present

## 2022-04-12 DIAGNOSIS — I4891 Unspecified atrial fibrillation: Secondary | ICD-10-CM | POA: Diagnosis not present

## 2022-04-13 DIAGNOSIS — I5032 Chronic diastolic (congestive) heart failure: Secondary | ICD-10-CM | POA: Diagnosis not present

## 2022-04-13 DIAGNOSIS — R7989 Other specified abnormal findings of blood chemistry: Secondary | ICD-10-CM | POA: Diagnosis not present

## 2022-04-13 DIAGNOSIS — I2510 Atherosclerotic heart disease of native coronary artery without angina pectoris: Secondary | ICD-10-CM | POA: Diagnosis not present

## 2022-04-13 DIAGNOSIS — I482 Chronic atrial fibrillation, unspecified: Secondary | ICD-10-CM | POA: Diagnosis not present

## 2022-04-13 DIAGNOSIS — I4891 Unspecified atrial fibrillation: Secondary | ICD-10-CM | POA: Diagnosis not present

## 2022-04-13 DIAGNOSIS — I5030 Unspecified diastolic (congestive) heart failure: Secondary | ICD-10-CM | POA: Diagnosis not present

## 2022-04-13 DIAGNOSIS — R54 Age-related physical debility: Secondary | ICD-10-CM | POA: Diagnosis not present

## 2022-04-13 DIAGNOSIS — Z79899 Other long term (current) drug therapy: Secondary | ICD-10-CM | POA: Diagnosis not present

## 2022-04-13 DIAGNOSIS — F419 Anxiety disorder, unspecified: Secondary | ICD-10-CM | POA: Diagnosis not present

## 2022-04-13 DIAGNOSIS — E079 Disorder of thyroid, unspecified: Secondary | ICD-10-CM | POA: Diagnosis not present

## 2022-04-13 DIAGNOSIS — D649 Anemia, unspecified: Secondary | ICD-10-CM | POA: Diagnosis not present

## 2022-04-13 DIAGNOSIS — F4310 Post-traumatic stress disorder, unspecified: Secondary | ICD-10-CM | POA: Diagnosis not present

## 2022-04-13 DIAGNOSIS — I110 Hypertensive heart disease with heart failure: Secondary | ICD-10-CM | POA: Diagnosis not present

## 2022-04-14 DIAGNOSIS — F419 Anxiety disorder, unspecified: Secondary | ICD-10-CM | POA: Diagnosis not present

## 2022-04-14 DIAGNOSIS — I2510 Atherosclerotic heart disease of native coronary artery without angina pectoris: Secondary | ICD-10-CM | POA: Diagnosis not present

## 2022-04-14 DIAGNOSIS — I272 Pulmonary hypertension, unspecified: Secondary | ICD-10-CM | POA: Diagnosis not present

## 2022-04-14 DIAGNOSIS — I4891 Unspecified atrial fibrillation: Secondary | ICD-10-CM | POA: Diagnosis not present

## 2022-04-14 DIAGNOSIS — D649 Anemia, unspecified: Secondary | ICD-10-CM | POA: Diagnosis not present

## 2022-04-14 DIAGNOSIS — F4310 Post-traumatic stress disorder, unspecified: Secondary | ICD-10-CM | POA: Diagnosis not present

## 2022-04-14 DIAGNOSIS — E079 Disorder of thyroid, unspecified: Secondary | ICD-10-CM | POA: Diagnosis not present

## 2022-04-14 DIAGNOSIS — Z79899 Other long term (current) drug therapy: Secondary | ICD-10-CM | POA: Diagnosis not present

## 2022-04-14 DIAGNOSIS — R54 Age-related physical debility: Secondary | ICD-10-CM | POA: Diagnosis not present

## 2022-04-14 DIAGNOSIS — I10 Essential (primary) hypertension: Secondary | ICD-10-CM | POA: Diagnosis not present

## 2022-04-15 DIAGNOSIS — R7989 Other specified abnormal findings of blood chemistry: Secondary | ICD-10-CM | POA: Diagnosis not present

## 2022-04-15 DIAGNOSIS — I110 Hypertensive heart disease with heart failure: Secondary | ICD-10-CM | POA: Diagnosis not present

## 2022-04-15 DIAGNOSIS — Z79890 Hormone replacement therapy (postmenopausal): Secondary | ICD-10-CM | POA: Diagnosis not present

## 2022-04-15 DIAGNOSIS — I509 Heart failure, unspecified: Secondary | ICD-10-CM | POA: Diagnosis not present

## 2022-04-15 DIAGNOSIS — I2510 Atherosclerotic heart disease of native coronary artery without angina pectoris: Secondary | ICD-10-CM | POA: Diagnosis not present

## 2022-04-15 DIAGNOSIS — F4310 Post-traumatic stress disorder, unspecified: Secondary | ICD-10-CM | POA: Diagnosis not present

## 2022-04-15 DIAGNOSIS — E079 Disorder of thyroid, unspecified: Secondary | ICD-10-CM | POA: Diagnosis not present

## 2022-04-15 DIAGNOSIS — D649 Anemia, unspecified: Secondary | ICD-10-CM | POA: Diagnosis not present

## 2022-04-15 DIAGNOSIS — R54 Age-related physical debility: Secondary | ICD-10-CM | POA: Diagnosis not present

## 2022-04-15 DIAGNOSIS — Z79899 Other long term (current) drug therapy: Secondary | ICD-10-CM | POA: Diagnosis not present

## 2022-04-15 DIAGNOSIS — I4891 Unspecified atrial fibrillation: Secondary | ICD-10-CM | POA: Diagnosis not present

## 2022-04-15 DIAGNOSIS — F419 Anxiety disorder, unspecified: Secondary | ICD-10-CM | POA: Diagnosis not present

## 2022-04-16 ENCOUNTER — Encounter: Payer: Self-pay | Admitting: Internal Medicine

## 2022-04-16 DIAGNOSIS — D649 Anemia, unspecified: Secondary | ICD-10-CM | POA: Diagnosis not present

## 2022-04-16 DIAGNOSIS — F419 Anxiety disorder, unspecified: Secondary | ICD-10-CM | POA: Diagnosis not present

## 2022-04-16 DIAGNOSIS — R54 Age-related physical debility: Secondary | ICD-10-CM | POA: Diagnosis not present

## 2022-04-16 DIAGNOSIS — F4310 Post-traumatic stress disorder, unspecified: Secondary | ICD-10-CM | POA: Diagnosis not present

## 2022-04-16 DIAGNOSIS — J9811 Atelectasis: Secondary | ICD-10-CM | POA: Diagnosis not present

## 2022-04-16 DIAGNOSIS — R0602 Shortness of breath: Secondary | ICD-10-CM | POA: Diagnosis not present

## 2022-04-16 DIAGNOSIS — I509 Heart failure, unspecified: Secondary | ICD-10-CM | POA: Diagnosis not present

## 2022-04-16 DIAGNOSIS — I110 Hypertensive heart disease with heart failure: Secondary | ICD-10-CM | POA: Diagnosis not present

## 2022-04-16 DIAGNOSIS — I482 Chronic atrial fibrillation, unspecified: Secondary | ICD-10-CM | POA: Diagnosis not present

## 2022-04-16 DIAGNOSIS — I2510 Atherosclerotic heart disease of native coronary artery without angina pectoris: Secondary | ICD-10-CM | POA: Diagnosis not present

## 2022-04-16 DIAGNOSIS — Z79899 Other long term (current) drug therapy: Secondary | ICD-10-CM | POA: Diagnosis not present

## 2022-04-16 DIAGNOSIS — I4891 Unspecified atrial fibrillation: Secondary | ICD-10-CM | POA: Diagnosis not present

## 2022-04-16 DIAGNOSIS — I5032 Chronic diastolic (congestive) heart failure: Secondary | ICD-10-CM | POA: Diagnosis not present

## 2022-04-16 DIAGNOSIS — J90 Pleural effusion, not elsewhere classified: Secondary | ICD-10-CM | POA: Diagnosis not present

## 2022-04-16 DIAGNOSIS — E079 Disorder of thyroid, unspecified: Secondary | ICD-10-CM | POA: Diagnosis not present

## 2022-04-17 DIAGNOSIS — Z79899 Other long term (current) drug therapy: Secondary | ICD-10-CM | POA: Diagnosis not present

## 2022-04-17 DIAGNOSIS — I5030 Unspecified diastolic (congestive) heart failure: Secondary | ICD-10-CM | POA: Diagnosis not present

## 2022-04-17 DIAGNOSIS — I2510 Atherosclerotic heart disease of native coronary artery without angina pectoris: Secondary | ICD-10-CM | POA: Diagnosis not present

## 2022-04-17 DIAGNOSIS — I272 Pulmonary hypertension, unspecified: Secondary | ICD-10-CM | POA: Diagnosis not present

## 2022-04-17 DIAGNOSIS — R7989 Other specified abnormal findings of blood chemistry: Secondary | ICD-10-CM | POA: Diagnosis not present

## 2022-04-17 DIAGNOSIS — E079 Disorder of thyroid, unspecified: Secondary | ICD-10-CM | POA: Diagnosis not present

## 2022-04-17 DIAGNOSIS — F4310 Post-traumatic stress disorder, unspecified: Secondary | ICD-10-CM | POA: Diagnosis not present

## 2022-04-17 DIAGNOSIS — F419 Anxiety disorder, unspecified: Secondary | ICD-10-CM | POA: Diagnosis not present

## 2022-04-17 DIAGNOSIS — R54 Age-related physical debility: Secondary | ICD-10-CM | POA: Diagnosis not present

## 2022-04-17 DIAGNOSIS — I4891 Unspecified atrial fibrillation: Secondary | ICD-10-CM | POA: Diagnosis not present

## 2022-04-17 DIAGNOSIS — I110 Hypertensive heart disease with heart failure: Secondary | ICD-10-CM | POA: Diagnosis not present

## 2022-04-17 DIAGNOSIS — D649 Anemia, unspecified: Secondary | ICD-10-CM | POA: Diagnosis not present

## 2022-04-18 DIAGNOSIS — F4310 Post-traumatic stress disorder, unspecified: Secondary | ICD-10-CM | POA: Diagnosis not present

## 2022-04-18 DIAGNOSIS — D649 Anemia, unspecified: Secondary | ICD-10-CM | POA: Diagnosis not present

## 2022-04-18 DIAGNOSIS — Z79899 Other long term (current) drug therapy: Secondary | ICD-10-CM | POA: Diagnosis not present

## 2022-04-18 DIAGNOSIS — Z79890 Hormone replacement therapy (postmenopausal): Secondary | ICD-10-CM | POA: Diagnosis not present

## 2022-04-18 DIAGNOSIS — I5031 Acute diastolic (congestive) heart failure: Secondary | ICD-10-CM | POA: Diagnosis not present

## 2022-04-18 DIAGNOSIS — E039 Hypothyroidism, unspecified: Secondary | ICD-10-CM | POA: Diagnosis not present

## 2022-04-18 DIAGNOSIS — I482 Chronic atrial fibrillation, unspecified: Secondary | ICD-10-CM | POA: Diagnosis not present

## 2022-04-18 DIAGNOSIS — J449 Chronic obstructive pulmonary disease, unspecified: Secondary | ICD-10-CM | POA: Diagnosis not present

## 2022-04-18 DIAGNOSIS — I2510 Atherosclerotic heart disease of native coronary artery without angina pectoris: Secondary | ICD-10-CM | POA: Diagnosis not present

## 2022-04-18 DIAGNOSIS — R54 Age-related physical debility: Secondary | ICD-10-CM | POA: Diagnosis not present

## 2022-04-18 DIAGNOSIS — I110 Hypertensive heart disease with heart failure: Secondary | ICD-10-CM | POA: Diagnosis not present

## 2022-04-19 DIAGNOSIS — R54 Age-related physical debility: Secondary | ICD-10-CM | POA: Diagnosis not present

## 2022-04-19 DIAGNOSIS — Z79890 Hormone replacement therapy (postmenopausal): Secondary | ICD-10-CM | POA: Diagnosis not present

## 2022-04-19 DIAGNOSIS — F4310 Post-traumatic stress disorder, unspecified: Secondary | ICD-10-CM | POA: Diagnosis not present

## 2022-04-19 DIAGNOSIS — F419 Anxiety disorder, unspecified: Secondary | ICD-10-CM | POA: Diagnosis not present

## 2022-04-19 DIAGNOSIS — E079 Disorder of thyroid, unspecified: Secondary | ICD-10-CM | POA: Diagnosis not present

## 2022-04-19 DIAGNOSIS — R7989 Other specified abnormal findings of blood chemistry: Secondary | ICD-10-CM | POA: Diagnosis not present

## 2022-04-19 DIAGNOSIS — Z79899 Other long term (current) drug therapy: Secondary | ICD-10-CM | POA: Diagnosis not present

## 2022-04-19 DIAGNOSIS — I110 Hypertensive heart disease with heart failure: Secondary | ICD-10-CM | POA: Diagnosis not present

## 2022-04-19 DIAGNOSIS — I2510 Atherosclerotic heart disease of native coronary artery without angina pectoris: Secondary | ICD-10-CM | POA: Diagnosis not present

## 2022-04-19 DIAGNOSIS — I4891 Unspecified atrial fibrillation: Secondary | ICD-10-CM | POA: Diagnosis not present

## 2022-04-19 DIAGNOSIS — D649 Anemia, unspecified: Secondary | ICD-10-CM | POA: Diagnosis not present

## 2022-04-19 DIAGNOSIS — I509 Heart failure, unspecified: Secondary | ICD-10-CM | POA: Diagnosis not present

## 2022-04-25 DIAGNOSIS — I509 Heart failure, unspecified: Secondary | ICD-10-CM | POA: Diagnosis not present
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
## 2022-05-07 ENCOUNTER — Ambulatory Visit: Payer: Medicare Other | Admitting: Gastroenterology
